# Patient Record
Sex: Female | Born: 1965 | Race: Black or African American | Hispanic: No | Marital: Married | Smoking: Current every day smoker
Health system: Southern US, Community
[De-identification: ages and names within clinical notes are randomized; demographics above are authoritative.]

## PROBLEM LIST (undated history)

## (undated) DIAGNOSIS — I10 Essential (primary) hypertension: Secondary | ICD-10-CM

## (undated) DIAGNOSIS — I639 Cerebral infarction, unspecified: Secondary | ICD-10-CM

## (undated) DIAGNOSIS — D649 Anemia, unspecified: Secondary | ICD-10-CM

## (undated) DIAGNOSIS — E041 Nontoxic single thyroid nodule: Secondary | ICD-10-CM

## (undated) DIAGNOSIS — E119 Type 2 diabetes mellitus without complications: Secondary | ICD-10-CM

## (undated) HISTORY — DX: Type 2 diabetes mellitus without complications: E11.9

## (undated) HISTORY — DX: Cerebral infarction, unspecified: I63.9

---
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
## 2006-05-25 ENCOUNTER — Encounter: Admission: RE | Admit: 2006-05-25 | Discharge: 2006-05-25 | Payer: Self-pay | Admitting: Family Medicine

## 2013-02-19 ENCOUNTER — Other Ambulatory Visit: Payer: Self-pay | Admitting: Nurse Practitioner

## 2013-12-21 ENCOUNTER — Other Ambulatory Visit: Payer: Self-pay | Admitting: Nurse Practitioner

## 2013-12-24 NOTE — Telephone Encounter (Signed)
Last seen 03/14 

## 2013-12-24 NOTE — Telephone Encounter (Signed)
Last refill without being seen 

## 2014-03-24 ENCOUNTER — Telehealth: Payer: Self-pay | Admitting: Nurse Practitioner

## 2014-03-24 NOTE — Telephone Encounter (Signed)
LMOM that she needs an appt before it can be filled

## 2014-04-03 ENCOUNTER — Other Ambulatory Visit: Payer: Self-pay | Admitting: Nurse Practitioner

## 2014-04-06 ENCOUNTER — Other Ambulatory Visit: Payer: Self-pay | Admitting: Nurse Practitioner

## 2014-04-08 ENCOUNTER — Other Ambulatory Visit: Payer: Self-pay | Admitting: Nurse Practitioner

## 2014-04-10 ENCOUNTER — Other Ambulatory Visit: Payer: Self-pay

## 2014-04-17 ENCOUNTER — Telehealth: Payer: Self-pay | Admitting: Nurse Practitioner

## 2014-04-18 MED ORDER — AMLODIPINE BESYLATE 10 MG PO TABS: ORAL_TABLET | ORAL | Status: DC

## 2014-04-18 NOTE — Telephone Encounter (Signed)
Pt aware on VM that med has been sent [facility] - to f/u this week as planned

## 2014-04-23 ENCOUNTER — Ambulatory Visit: Payer: Self-pay | Admitting: Nurse Practitioner

## 2014-08-28 ENCOUNTER — Encounter: Payer: Self-pay | Admitting: Family Medicine

## 2014-08-28 ENCOUNTER — Ambulatory Visit: Payer: Self-pay | Admitting: Family Medicine

## 2014-08-28 VITALS — BP 198/100 | HR 93 | Temp 98.1°F | Ht 62.0 in | Wt 143.6 lb

## 2014-08-28 DIAGNOSIS — I10 Essential (primary) hypertension: Secondary | ICD-10-CM

## 2014-08-28 MED ORDER — AMLODIPINE BESYLATE 10 MG PO TABS: ORAL_TABLET | ORAL | Status: DC

## 2014-08-28 NOTE — Progress Notes (Signed)
   Subjective:    Patient ID: Tammy Bauer, female    DOB: 04-26-1966, 48 y.o.   MRN: [id number]  HPI C/o hypertension and needs refills.   Review of Systems No chest pain, SOB, HA, dizziness, vision change, N/V, diarrhea, constipation, dysuria, urinary urgency or frequency, myalgias, arthralgias or rash.     Objective:   Physical Exam  Vital signs noted  Well developed well nourished female.  HEENT - Head atraumatic Normocephalic                Eyes - PERRLA, Conjuctiva - clear Sclera- Clear EOMI                Ears - EAC's Wnl TM's Wnl Gross Hearing WNL                Nose - Nares patent                 Throat - oropharanx wnl Respiratory - Lungs CTA bilateral Cardiac - RRR S1 and S2 without murmur GI - Abdomen soft Nontender and bowel sounds active x 4 Extremities - No edema. Neuro - Grossly intact.      Assessment & Plan:  Essential hypertension Amlodipine  one po qd #30w/11 rf  Deatra Canter FNP

## 2014-12-20 ENCOUNTER — Encounter (HOSPITAL_COMMUNITY): Payer: Self-pay | Admitting: *Deleted

## 2014-12-20 ENCOUNTER — Emergency Department (HOSPITAL_COMMUNITY)
Admission: EM | Admit: 2014-12-20 | Discharge: 2014-12-20 | Disposition: A | Discharge status: Home / Self Care | Payer: Self-pay | Attending: Emergency Medicine | Admitting: Emergency Medicine

## 2014-12-20 DIAGNOSIS — Z79899 Other long term (current) drug therapy: Secondary | ICD-10-CM | POA: Insufficient documentation

## 2014-12-20 DIAGNOSIS — I10 Essential (primary) hypertension: Secondary | ICD-10-CM | POA: Insufficient documentation

## 2014-12-20 DIAGNOSIS — Z720 Tobacco use: Secondary | ICD-10-CM | POA: Insufficient documentation

## 2014-12-20 DIAGNOSIS — M5442 Lumbago with sciatica, left side: Secondary | ICD-10-CM

## 2014-12-20 DIAGNOSIS — IMO0001 Reserved for inherently not codable concepts without codable children: Secondary | ICD-10-CM

## 2014-12-20 DIAGNOSIS — R030 Elevated blood-pressure reading, without diagnosis of hypertension: Secondary | ICD-10-CM

## 2014-12-20 HISTORY — DX: Essential (primary) hypertension: I10

## 2014-12-20 MED ORDER — TRAMADOL HCL 50 MG PO TABS: 50.0000 mg | ORAL_TABLET | Freq: Four times a day (QID) | ORAL | Status: DC | PRN

## 2014-12-20 MED ORDER — DICLOFENAC POTASSIUM 50 MG PO TABS: 50.0000 mg | ORAL_TABLET | Freq: Three times a day (TID) | ORAL | Status: DC

## 2014-12-20 MED ORDER — IBUPROFEN 400 MG PO TABS
600.0000 mg | ORAL_TABLET | Freq: Once | ORAL | Status: AC
  Administered 2014-12-20: 600 mg via ORAL
  Filled 2014-12-20: qty 2

## 2014-12-20 MED ORDER — CYCLOBENZAPRINE HCL 10 MG PO TABS: 10.0000 mg | ORAL_TABLET | Freq: Two times a day (BID) | ORAL | Status: DC | PRN

## 2014-12-20 MED ORDER — TRAMADOL HCL 50 MG PO TABS
50.0000 mg | ORAL_TABLET | Freq: Once | ORAL | Status: AC
  Administered 2014-12-20: 50 mg via ORAL
  Filled 2014-12-20: qty 1

## 2014-12-20 NOTE — ED Notes (Signed)
Pt states she fell up the steps last Monday, pain got much better but returned a few days ago. States pain has progressively gotten worse since pain returned. Pain is worse when standing straight

## 2014-12-20 NOTE — ED Provider Notes (Signed)
CSN: [id number]     Arrival date & time 12/20/14  0865 History   First MD Initiated Contact with Patient 12/20/14 [phone]     Chief Complaint  Patient presents with  . Back Pain     (Consider location/radiation/quality/duration/timing/severity/associated sxs/prior Treatment) HPI Pt is a 49yo female presenting to ED with c/o left lower back pain that radiates down left leg. Pt reports running and falling up the steps at home 2 weeks ago after her daughter scared her.  Denies hitting head or other injuries. States lower back pain was improving but then came back last week, gradually worsening. Pain is constant, worse with lying flat and standing straight up.  Pain is sharp, shoots down to left lower leg, 10/10. Has taken ibuprofen with minimal relief. No fever, n/v/d. Denies urinary symptoms. No hx of renal stones. Denise hx of cancer, IVDU, or back surgeries.   Past Medical History  Diagnosis Date  . Hypertension    History reviewed. No pertinent past surgical history. No family history on file. History  Substance Use Topics  . Smoking status: Current Every Day Smoker -- 0.50 packs/day    Types: Cigarettes    Start date: 08/29/1999  . Smokeless tobacco: Not on file  . Alcohol Use: No   OB History    No data available     Review of Systems  Constitutional: Negative for fever and chills.  Cardiovascular: Negative for chest pain.  Gastrointestinal: Negative for nausea, vomiting, abdominal pain and diarrhea.  Genitourinary: Negative for dysuria, frequency, hematuria, flank pain and pelvic pain.  Musculoskeletal: Positive for myalgias, back pain and gait problem. Negative for joint swelling, arthralgias and neck stiffness.  All other systems reviewed and are negative.     Allergies  Ace inhibitors  Home Medications   Prior to Admission medications   Medication Sig Start Date End Date Taking? Authorizing Provider  amLODipine (NORVASC) 10 MG tablet TAKE ONE TABLET BY MOUTH ONCE  DAILY 08/28/14   Deatra Canter, FNP  cyclobenzaprine (FLEXERIL) 10 MG tablet Take 1 tablet (10 mg total) by mouth 2 (two) times daily as needed for muscle spasms. 12/20/14   Junius Finner, PA-C  diclofenac (CATAFLAM) 50 MG tablet Take 1 tablet (50 mg total) by mouth 3 (three) times daily. 12/20/14   Junius Finner, PA-C  hydrochlorothiazide (HYDRODIURIL) 25 MG tablet Take 25 mg by mouth daily.    Historical Provider, MD  traMADol (ULTRAM) 50 MG tablet Take 1 tablet (50 mg total) by mouth every 6 (six) hours as needed. 12/20/14   Junius Finner, PA-C   BP 188/115 mmHg  Pulse 89  Temp(Src) 98.1 F (36.7 C) (Oral)  Resp 16  SpO2 98%  LMP 11/29/2014 Physical Exam  Constitutional: She appears well-developed and well-nourished. She appears distressed.  Pt lying on right side in exam bed. Appears mildly uncomfortable.  HENT:  Head: Normocephalic and atraumatic.  Eyes: Conjunctivae are normal. No scleral icterus.  Neck: Normal range of motion. Neck supple.  No midline bone tenderness, no crepitus or step-offs.   Cardiovascular: Normal rate, regular rhythm and normal heart sounds.   Pulmonary/Chest: Effort normal and breath sounds normal. No respiratory distress. She has no wheezes. She has no rales. She exhibits no tenderness.  Abdominal: Soft. She exhibits no distension. There is no tenderness.  Musculoskeletal: She exhibits tenderness. She exhibits no edema.  No midline spinal tenderness. Tenderness to left lower lumbar paraspinal muscles and left buttock. Increased pain with straight leg raise. 4/5 strength in  left hip vs right due to pain. 5/5 plantar flexion and dorsiflexion. 5/5 strength with left knee flexion and extension.  Neurological: She is alert.  Reflex Scores:      Patellar reflexes are 2+ on the right side and 2+ on the left side. Skin: Skin is warm and dry. No rash noted. She is not diaphoretic. No erythema.  Nursing note and vitals reviewed.   ED Course  Procedures (including  critical care time) Labs Review Labs Reviewed - No data to display  Imaging Review No results found.   EKG Interpretation None      MDM   Final diagnoses:  Left-sided low back pain with left-sided sciatica  Elevated BP    Pt presenting to ED with left sided sciatica after falling up the stairs 2 weeks ago. No red flag symptoms. No midline spinal tenderness. Imaging not indicated at this time. Will tx conservatively for pain.  Rx: tramadol, diclofenac, and flexeril. Home care instructions with exercises provided.  BP is elevated at 194/127, pt does have hx of HTN, has not taken her medication this morning.  Advised to f/u with PCP in 3-4 days for recheck of symptoms if not improving. Return precautions provided. Pt verbalized understanding and agreement with tx plan.     Junius Finnerrin O'Malley, PA-C 12/20/14 [id number]  Donnetta HutchingBrian Cook, MD 12/22/14 [phone]

## 2015-05-22 ENCOUNTER — Emergency Department (HOSPITAL_COMMUNITY): Payer: Self-pay

## 2015-05-22 ENCOUNTER — Encounter (HOSPITAL_COMMUNITY): Payer: Self-pay | Admitting: Emergency Medicine

## 2015-05-22 ENCOUNTER — Emergency Department (HOSPITAL_COMMUNITY)
Admission: EM | Admit: 2015-05-22 | Discharge: 2015-05-22 | Disposition: A | Discharge status: Home / Self Care | Payer: Self-pay | Attending: Emergency Medicine | Admitting: Emergency Medicine

## 2015-05-22 DIAGNOSIS — R51 Headache: Secondary | ICD-10-CM | POA: Insufficient documentation

## 2015-05-22 DIAGNOSIS — R42 Dizziness and giddiness: Secondary | ICD-10-CM | POA: Insufficient documentation

## 2015-05-22 DIAGNOSIS — R079 Chest pain, unspecified: Secondary | ICD-10-CM | POA: Insufficient documentation

## 2015-05-22 DIAGNOSIS — Z720 Tobacco use: Secondary | ICD-10-CM | POA: Insufficient documentation

## 2015-05-22 DIAGNOSIS — R112 Nausea with vomiting, unspecified: Secondary | ICD-10-CM | POA: Insufficient documentation

## 2015-05-22 DIAGNOSIS — H5509 Other forms of nystagmus: Secondary | ICD-10-CM | POA: Insufficient documentation

## 2015-05-22 DIAGNOSIS — Z79899 Other long term (current) drug therapy: Secondary | ICD-10-CM | POA: Insufficient documentation

## 2015-05-22 DIAGNOSIS — R739 Hyperglycemia, unspecified: Secondary | ICD-10-CM | POA: Insufficient documentation

## 2015-05-22 DIAGNOSIS — I10 Essential (primary) hypertension: Secondary | ICD-10-CM | POA: Insufficient documentation

## 2015-05-22 LAB — URINE MICROSCOPIC-ADD ON

## 2015-05-22 LAB — CBC WITH DIFFERENTIAL/PLATELET
Basophils Absolute: 0 10*3/uL (ref 0.0–0.1)
Basophils Relative: 0 % (ref 0–1)
EOS ABS: 0 10*3/uL (ref 0.0–0.7)
Eosinophils Relative: 0 % (ref 0–5)
HCT: 42 % (ref 36.0–46.0)
Hemoglobin: 14.2 g/dL (ref 12.0–15.0)
LYMPHS ABS: 1.7 10*3/uL (ref 0.7–4.0)
Lymphocytes Relative: 15 % (ref 12–46)
MCH: 29.3 pg (ref 26.0–34.0)
MCHC: 33.8 g/dL (ref 30.0–36.0)
MCV: 86.6 fL (ref 78.0–100.0)
Monocytes Absolute: 0.6 10*3/uL (ref 0.1–1.0)
Monocytes Relative: 5 % (ref 3–12)
NEUTROS PCT: 80 % — AB (ref 43–77)
Neutro Abs: 8.7 10*3/uL — ABNORMAL HIGH (ref 1.7–7.7)
PLATELETS: 169 10*3/uL (ref 150–400)
RBC: 4.85 MIL/uL (ref 3.87–5.11)
RDW: 15.4 % (ref 11.5–15.5)
WBC: 11 10*3/uL — AB (ref 4.0–10.5)

## 2015-05-22 LAB — COMPREHENSIVE METABOLIC PANEL
ALT: 11 U/L — AB (ref 14–54)
AST: 13 U/L — ABNORMAL LOW (ref 15–41)
Albumin: 4 g/dL (ref 3.5–5.0)
Alkaline Phosphatase: 48 U/L (ref 38–126)
Anion gap: 9 (ref 5–15)
BILIRUBIN TOTAL: 0.5 mg/dL (ref 0.3–1.2)
BUN: 9 mg/dL (ref 6–20)
CALCIUM: 9.9 mg/dL (ref 8.9–10.3)
CHLORIDE: 102 mmol/L (ref 101–111)
CO2: 24 mmol/L (ref 22–32)
CREATININE: 0.59 mg/dL (ref 0.44–1.00)
GFR calc Af Amer: >60 mL/min (ref >60)
GFR calc non Af Amer: >60 mL/min (ref >60)
GLUCOSE: 178 mg/dL — AB (ref 65–99)
Potassium: 3.3 mmol/L — ABNORMAL LOW (ref 3.5–5.1)
Sodium: 135 mmol/L (ref 135–145)
Total Protein: 7.7 g/dL (ref 6.5–8.1)

## 2015-05-22 LAB — URINALYSIS, ROUTINE W REFLEX MICROSCOPIC
Bilirubin Urine: NEGATIVE
Glucose, UA: 250 mg/dL — AB
HGB URINE DIPSTICK: NEGATIVE
Ketones, ur: 40 mg/dL — AB
Leukocytes, UA: NEGATIVE
Nitrite: NEGATIVE
PH: 7 (ref 5.0–8.0)
Protein, ur: 30 mg/dL — AB
SPECIFIC GRAVITY, URINE: 1.022 (ref 1.005–1.030)
UROBILINOGEN UA: 1 mg/dL (ref 0.0–1.0)

## 2015-05-22 LAB — I-STAT TROPONIN, ED: Troponin i, poc: 0 ng/mL (ref 0.00–0.08)

## 2015-05-22 LAB — POC URINE PREG, ED: PREG TEST UR: NEGATIVE

## 2015-05-22 LAB — LIPASE, BLOOD: Lipase: 25 U/L (ref 22–51)

## 2015-05-22 MED ORDER — MECLIZINE HCL 25 MG PO TABS
25.0000 mg | ORAL_TABLET | Freq: Once | ORAL | Status: AC
  Administered 2015-05-22: 25 mg via ORAL
  Filled 2015-05-22: qty 1

## 2015-05-22 MED ORDER — METOCLOPRAMIDE HCL 5 MG/ML IJ SOLN
10.0000 mg | Freq: Once | INTRAMUSCULAR | Status: AC
  Administered 2015-05-22: 10 mg via INTRAVENOUS
  Filled 2015-05-22: qty 2

## 2015-05-22 MED ORDER — DIPHENHYDRAMINE HCL 50 MG/ML IJ SOLN
25.0000 mg | Freq: Once | INTRAMUSCULAR | Status: AC
  Administered 2015-05-22: 25 mg via INTRAVENOUS
  Filled 2015-05-22: qty 1

## 2015-05-22 MED ORDER — SODIUM CHLORIDE 0.9 % IV BOLUS (SEPSIS)
1000.0000 mL | Freq: Once | INTRAVENOUS | Status: AC
  Administered 2015-05-22: 1000 mL via INTRAVENOUS

## 2015-05-22 MED ORDER — MECLIZINE HCL 25 MG PO TABS: 12.5000 mg | ORAL_TABLET | Freq: Three times a day (TID) | ORAL | Status: DC | PRN

## 2015-05-22 NOTE — ED Notes (Signed)
Pt c/o dizziness, nv, generalized weakness and headache since last night.

## 2015-05-22 NOTE — ED Provider Notes (Addendum)
CSN: [id number]     Arrival date & time 05/22/15  1912 History   First MD Initiated Contact with Patient 05/22/15 1947     Chief Complaint  Patient presents with  . Dizziness  . Emesis     (Consider location/radiation/quality/duration/timing/severity/associated sxs/prior Treatment) The history is provided by the patient.  Adline Braun is a 49 y.o. female who presenting with headache, dizziness, nausea, vomiting. Patient had some vaginal bleeding for the last 3 weeks. About 3 days ago. Intermittent nosebleeds as well. Yesterday night, she had acute onset of epigastric pain that resolved. She then got up and suddenly felt dizzy and have vertigo. Larey Seat that the room was spinning and felt unsteady. Also feels lightheaded and dizzy like she is going to pass out. She also has some headaches as well. Vomited three times.    Past Medical History  Diagnosis Date  . Hypertension    History reviewed. No pertinent past surgical history. No family history on file. History  Substance Use Topics  . Smoking status: Current Every Day Smoker -- 0.50 packs/day    Types: Cigarettes    Start date: 08/29/1999  . Smokeless tobacco: Not on file  . Alcohol Use: No   OB History    No data available     Review of Systems  Gastrointestinal: Positive for vomiting.  Neurological: Positive for dizziness.  All other systems reviewed and are negative.     Allergies  Ace inhibitors  Home Medications   Prior to Admission medications   Medication Sig Start Date End Date Taking? Authorizing Provider  acetaminophen (TYLENOL) 325 MG tablet Take 650 mg by mouth every 6 (six) hours as needed.   Yes Historical Provider, MD  amLODipine (NORVASC) 10 MG tablet TAKE ONE TABLET BY MOUTH ONCE DAILY 08/28/14  Yes Deatra Canter, FNP  ibuprofen (ADVIL,MOTRIN) 400 MG tablet Take 400 mg by mouth every 6 (six) hours as needed for moderate pain.   Yes Historical Provider, MD  naproxen sodium (ANAPROX) 220 MG tablet Take  220 mg by mouth 2 (two) times daily as needed (for pain).   Yes Historical Provider, MD  cyclobenzaprine (FLEXERIL) 10 MG tablet Take 1 tablet (10 mg total) by mouth 2 (two) times daily as needed for muscle spasms. 12/20/14   Junius Finner, PA-C  diclofenac (CATAFLAM) 50 MG tablet Take 1 tablet (50 mg total) by mouth 3 (three) times daily. 12/20/14   Junius Finner, PA-C  traMADol (ULTRAM) 50 MG tablet Take 1 tablet (50 mg total) by mouth every 6 (six) hours as needed. 12/20/14   Junius Finner, PA-C   BP 163/87 mmHg  Pulse 96  Temp(Src) 97.7 F (36.5 C) (Oral)  Resp 19  SpO2 100%  LMP 05/02/2015 Physical Exam  Constitutional: She is oriented to person, place, and time.  Uncomfortable   HENT:  Head: Normocephalic.  Eyes: Conjunctivae and EOM are normal. Pupils are equal, round, and reactive to light.  R horizontal nystagmus, not changing with direction, fatigable   Neck: Normal range of motion. Neck supple.  Cardiovascular: Normal rate, regular rhythm and normal heart sounds.   Pulmonary/Chest: Effort normal and breath sounds normal. No respiratory distress. She has no wheezes. She has no rales.  Abdominal: Soft. Bowel sounds are normal. She exhibits no distension. There is no tenderness. There is no rebound.  Musculoskeletal: Normal range of motion. She exhibits no edema or tenderness.  Neurological: She is alert and oriented to person, place, and time.  CN 2-12 intact. Nl  finger to nose. Nl gait. Nl strength throughout   Skin: Skin is warm and dry.  Psychiatric: She has a normal mood and affect. Her behavior is normal. Judgment and thought content normal.  Nursing note and vitals reviewed.   ED Course  Procedures (including critical care time) Labs Review Labs Reviewed  CBC WITH DIFFERENTIAL/PLATELET - Abnormal; Notable for the following:    WBC 11.0 (*)    Neutrophils Relative % 80 (*)    Neutro Abs 8.7 (*)    All other components within normal limits  COMPREHENSIVE METABOLIC  PANEL - Abnormal; Notable for the following:    Potassium 3.3 (*)    Glucose, Bld 178 (*)    AST 13 (*)    ALT 11 (*)    All other components within normal limits  URINALYSIS, ROUTINE W REFLEX MICROSCOPIC (NOT AT [facility]) - Abnormal; Notable for the following:    Glucose, UA 250 (*)    Ketones, ur 40 (*)    Protein, ur 30 (*)    All other components within normal limits  LIPASE, BLOOD  URINE MICROSCOPIC-ADD ON  I-STAT TROPOININ, ED  POC URINE PREG, ED    Imaging Review Ct Head Wo Contrast  05/22/2015   CLINICAL DATA:  Generalized weakness, dizziness, nausea, vomiting and headache since last night.  EXAM: CT HEAD WITHOUT CONTRAST  TECHNIQUE: Contiguous axial images were obtained from the base of the skull through the vertex without intravenous contrast.  COMPARISON:  None.  FINDINGS: Normal appearing cerebral hemispheres and posterior fossa structures. Normal size and position of the ventricles. No intracranial hemorrhage, mass lesion or CT evidence of acute infarction. Unremarkable bones and included paranasal sinuses.  IMPRESSION: Normal examination.   Electronically Signed   By: Beckie Salts M.D.   On: 05/22/2015 20:22   Dg Chest Port 1 View  05/22/2015   CLINICAL DATA:  Generalized weakness and dizziness for 1 day  EXAM: PORTABLE CHEST - 1 VIEW  COMPARISON:  None.  FINDINGS: Lungs are clear. Heart size and pulmonary vascularity are normal. No adenopathy. No bone lesions.  IMPRESSION: No edema or consolidation.   Electronically Signed   By: Bretta Bang III M.D.   On: 05/22/2015 20:16     EKG Interpretation   Date/Time:  Saturday May 22 2015 19:53:24 EDT Ventricular Rate:  80 PR Interval:  110 QRS Duration: 97 QT Interval:  370 QTC Calculation: 427 R Axis:   100 Text Interpretation:  Sinus rhythm Borderline short PR interval Consider  right ventricular hypertrophy Consider left ventricular hypertrophy  Nonspecific T abnormalities, lateral leads Anterior ST elevation,  probably  due to LVH No significant change since last tracing Confirmed by YAO  MD,  DAVID ([id number]) on 05/22/2015 8:10:25 PM      MDM   Final diagnoses:  Chest pain    Keliah Tsou is a 49 y.o. female here with vertigo, nausea, vomiting. Likely vertigo. Will get ct head, give meclizine and migraine cocktail and reassess.  8 pm EKG showed possible ST elevation V2, V3. No previous to compare. No chest pain. Discussed with Dr. Eldridge Dace, who thinks likely LVH with early repol. No reciprocal changes. Trop neg. Will not activate STEMI.   10:28 PM CT head unremarkable. Able to ambulate with no dizziness. I doubt posterior circulation stroke. Glucose 178. No hx of diabetes. Can be new onset diabetes. Will have her f/u PMD. Will not start metformin for now.    Richardean Canal, MD 05/22/15 2229  Onalee Hua  Jackie Plum, MD 05/22/15 2231

## 2015-05-22 NOTE — ED Notes (Signed)
Patient transported to CT 

## 2015-05-22 NOTE — Discharge Instructions (Signed)
Take meclizine for dizziness.   Your blood sugar is elevated. You may have prediabetes or diabetes. You should see your doctor about it.   Return to ER if you have worse dizziness, headache, vomiting.

## 2015-05-22 NOTE — ED Notes (Signed)
Pt sts she just got off her menstrual cycle which lasted 3 weeks. C/o recent nose bleeds as well. Is not on blood thinners.

## 2015-09-30 ENCOUNTER — Other Ambulatory Visit: Payer: Self-pay | Admitting: Family Medicine

## 2015-10-04 ENCOUNTER — Other Ambulatory Visit: Payer: Self-pay | Admitting: Family Medicine

## 2015-10-07 ENCOUNTER — Ambulatory Visit (INDEPENDENT_AMBULATORY_CARE_PROVIDER_SITE_OTHER): Payer: Self-pay | Admitting: Pediatrics

## 2015-10-07 ENCOUNTER — Encounter: Payer: Self-pay | Admitting: Pediatrics

## 2015-10-07 VITALS — BP 180/115 | HR 92 | Temp 98.1°F | Ht 62.0 in | Wt 146.4 lb

## 2015-10-07 DIAGNOSIS — I10 Essential (primary) hypertension: Secondary | ICD-10-CM

## 2015-10-07 DIAGNOSIS — E1159 Type 2 diabetes mellitus with other circulatory complications: Secondary | ICD-10-CM | POA: Insufficient documentation

## 2015-10-07 DIAGNOSIS — R739 Hyperglycemia, unspecified: Secondary | ICD-10-CM

## 2015-10-07 LAB — POCT GLYCOSYLATED HEMOGLOBIN (HGB A1C): Hemoglobin A1C: 7.7

## 2015-10-07 MED ORDER — AMLODIPINE BESYLATE 10 MG PO TABS: ORAL_TABLET | ORAL | Status: DC

## 2015-10-07 NOTE — Progress Notes (Signed)
    Subjective:    Patient ID: Tammy Bauer, female    DOB: 02-Sep-1966, 49 y.o.   MRN: [id number]  CC: f/u medical problems, DMV forms  HPI: Tammy Bauer is a 49 y.o. female presenting on 10/07/2015 for DMV forms  BP at home 149s-150s while on amlodipine Has not had medicine at home for past week Has a headache when her BP is up No nausea/vomiting, no chest pain, no SOB Had some heart burn last night Seen in ED 4 months ago for vertigo. Went away within the week, never returned. Otherwise feeling well. 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 Interim medical history since our last visit reviewed. Allergies and medications reviewed and updated.   ROS: Per HPI unless specifically indicated above  Past Medical History Patient Active Problem List   Diagnosis Date Noted  . Essential hypertension 10/07/2015  . Hyperglycemia 10/07/2015    Current Outpatient Prescriptions  Medication Sig Dispense Refill  . acetaminophen (TYLENOL) 325 MG tablet Take 650 mg by mouth every 6 (six) hours as needed.    Marland Kitchen. amLODipine (NORVASC) 10 MG tablet TAKE ONE TABLET BY MOUTH ONCE DAILY 30 tablet 11   No current facility-administered medications for this visit.       Objective:    BP 180/115 mmHg  Pulse 92  Temp(Src) 98.1 F (36.7 C) (Oral)  Ht 5\' 2"  (1.575 m)  Wt 146 lb 6.4 oz (66.407 kg)  BMI 26.77 kg/m2  Wt Readings from Last 3 Encounters:  10/07/15 146 lb 6.4 oz (66.407 kg)  08/28/14 143 lb 9.6 oz (65.137 kg)    Gen: NAD, alert, cooperative with exam, NCAT EYES: EOMI, no scleral injection or icterus ENT:  TMs pearly gray b/l, OP without erythema LYMPH: no cervical LAD CV: NRRR, normal S1/S2, no murmur, distal pulses 2+ b/l Resp: CTABL, no wheezes, normal WOB Abd: +BS, soft, NTND. 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 Needs to restart BP meds. Slight headache today. No vision changes. Will check below labs, also had hyperglycemia on most recent BMP, will send HgA1c. RTC next week for recheck BP. Likely needs to be on additional agent if BPs at home were really regularly 150s on amlodipine. [facility] because [facility] uncontrolled [facility]. RTC next week.  Diagnoses and all orders for this visit:  Essential hypertension -     amLODipine (NORVASC) 10 MG tablet; TAKE ONE TABLET BY MOUTH ONCE DAILY -     POCT glycosylated hemoglobin (Hb A1C) -     Sodium -     Potassium -     Creatinine, serum  Hyperglycemia -     POCT glycosylated hemoglobin (Hb A1C)  Follow up plan: Next week for recheck BP  Rex Krasarol Dajuan Turnley, MD [facility] 10/07/2015, 1:58 PM

## 2015-10-08 LAB — CREATININE, SERUM
CREATININE: 0.71 mg/dL (ref 0.57–1.00)
GFR calc non Af Amer: 102 mL/min/{1.73_m2} (ref >59)
GFR, EST AFRICAN AMERICAN: 117 mL/min/{1.73_m2} (ref >59)

## 2015-10-08 LAB — POTASSIUM: POTASSIUM: 4.1 mmol/L (ref 3.5–5.2)

## 2015-10-08 LAB — SODIUM: SODIUM: 137 mmol/L (ref 136–144)

## 2015-10-14 ENCOUNTER — Ambulatory Visit: Payer: Self-pay | Admitting: Pediatrics

## 2015-10-15 ENCOUNTER — Ambulatory Visit (INDEPENDENT_AMBULATORY_CARE_PROVIDER_SITE_OTHER): Payer: Self-pay | Admitting: Pediatrics

## 2015-10-15 ENCOUNTER — Encounter: Payer: Self-pay | Admitting: Pediatrics

## 2015-10-15 VITALS — BP 150/95 | HR 90 | Temp 97.5°F | Ht 62.0 in | Wt 148.0 lb

## 2015-10-15 DIAGNOSIS — E119 Type 2 diabetes mellitus without complications: Secondary | ICD-10-CM

## 2015-10-15 DIAGNOSIS — R030 Elevated blood-pressure reading, without diagnosis of hypertension: Secondary | ICD-10-CM

## 2015-10-15 DIAGNOSIS — IMO0001 Reserved for inherently not codable concepts without codable children: Secondary | ICD-10-CM

## 2015-10-15 MED ORDER — METFORMIN HCL 500 MG PO TABS: 500.0000 mg | ORAL_TABLET | Freq: Two times a day (BID) | ORAL | Status: DC

## 2015-10-15 MED ORDER — LOSARTAN POTASSIUM 50 MG PO TABS: 50.0000 mg | ORAL_TABLET | Freq: Every day | ORAL | Status: DC

## 2015-10-15 NOTE — Progress Notes (Signed)
    Subjective:    Patient ID: Tammy Bauer, female    DOB: Feb 05, 1966, 49 y.o.   MRN: [id number]  CC: BP recheck  HPI: Tammy Bauer is a 49 y.o. female presenting on 10/15/2015 for Blood Pressure Check  BPs have been 150s/100s at home No headache or vision changes   Relevant past medical, surgical, family and social history reviewed and updated as indicated. Interim medical history since our last visit reviewed. Allergies and medications reviewed and updated.   ROS: Per HPI unless specifically indicated above  Past Medical History Patient Active Problem List   Diagnosis Date Noted  . Essential hypertension 10/07/2015  . Hyperglycemia 10/07/2015    Current Outpatient Prescriptions  Medication Sig Dispense Refill  . acetaminophen (TYLENOL) 325 MG tablet Take 650 mg by mouth every 6 (six) hours as needed.    Marland Kitchen. amLODipine (NORVASC) 10 MG tablet TAKE ONE TABLET BY MOUTH ONCE DAILY 30 tablet 11  . losartan (COZAAR) 50 MG tablet Take 1 tablet (50 mg total) by mouth daily. 90 tablet 3  . metFORMIN (GLUCOPHAGE) 500 MG tablet Take 1 tablet (500 mg total) by mouth 2 (two) times daily with a meal. 180 tablet 3   No current facility-administered medications for this visit.       Objective:    BP 150/95 mmHg  Pulse 90  Temp(Src) 97.5 F (36.4 C) (Oral)  Ht 5\' 2"  (1.575 m)  Wt 148 lb (67.132 kg)  BMI 27.06 kg/m2  Wt Readings from Last 3 Encounters:  10/15/15 148 lb (67.132 kg)  10/07/15 146 lb 6.4 oz (66.407 kg)  08/28/14 143 lb 9.6 oz (65.137 kg)    Gen: NAD, alert EYES: EOMI, no scleral injection or icterus CV: NRRR, normal S1/S2, no murmur, distal pulses 2+ b/l Resp: CTABL, no wheezes, normal WOB Neuro: Alert and oriented, normal sensation to feet with touch and monofilament Skin: no breakdown of skin on feet     Assessment & Plan:   [name] was seen today for blood pressure check. Still elevated, start losartan. Return next week for BP check. If normal then, will need BP  check in 6 months. Discussed labs from last visit--pt does have diabetes. Will start metformin BID, stop sodas, avoid sugary carb heavy foods. Next HgA1c at next visit, ideally 3 months.  Diagnoses and all orders for this visit:  Elevated blood pressure -     losartan (COZAAR) 50 MG tablet; Take 1 tablet (50 mg total) by mouth daily.  Type 2 diabetes mellitus without complication, without long-term current use of insulin (HCC) -     metFORMIN (GLUCOPHAGE) 500 MG tablet; Take 1 tablet (500 mg total) by mouth 2 (two) times daily with a meal.   Follow up plan: Return for nurse visit Monday for BP check, see Dr. Oswaldo Donevincent in 3 months.  Rex Krasarol Yosmar Ryker, MD [facility] 10/15/2015, 2:05 PM

## 2016-01-17 ENCOUNTER — Ambulatory Visit: Payer: Self-pay | Admitting: Pediatrics

## 2016-01-18 ENCOUNTER — Encounter: Payer: Self-pay | Admitting: Pediatrics

## 2016-04-02 ENCOUNTER — Emergency Department (HOSPITAL_COMMUNITY)
Admission: EM | Admit: 2016-04-02 | Discharge: 2016-04-02 | Disposition: A | Discharge status: Home / Self Care | Payer: Self-pay | Attending: Emergency Medicine | Admitting: Emergency Medicine

## 2016-04-02 ENCOUNTER — Encounter (HOSPITAL_COMMUNITY): Payer: Self-pay | Admitting: *Deleted

## 2016-04-02 DIAGNOSIS — F17210 Nicotine dependence, cigarettes, uncomplicated: Secondary | ICD-10-CM | POA: Insufficient documentation

## 2016-04-02 DIAGNOSIS — I10 Essential (primary) hypertension: Secondary | ICD-10-CM | POA: Insufficient documentation

## 2016-04-02 DIAGNOSIS — Z79899 Other long term (current) drug therapy: Secondary | ICD-10-CM | POA: Insufficient documentation

## 2016-04-02 DIAGNOSIS — R1013 Epigastric pain: Secondary | ICD-10-CM | POA: Insufficient documentation

## 2016-04-02 LAB — URINALYSIS, ROUTINE W REFLEX MICROSCOPIC
Bilirubin Urine: NEGATIVE
GLUCOSE, UA: NEGATIVE mg/dL
Hgb urine dipstick: NEGATIVE
KETONES UR: 15 mg/dL — AB
NITRITE: NEGATIVE
PROTEIN: NEGATIVE mg/dL
Specific Gravity, Urine: 1.015 (ref 1.005–1.030)
pH: 6 (ref 5.0–8.0)

## 2016-04-02 LAB — COMPREHENSIVE METABOLIC PANEL
ALT: 11 U/L — ABNORMAL LOW (ref 14–54)
AST: 13 U/L — ABNORMAL LOW (ref 15–41)
Albumin: 4.1 g/dL (ref 3.5–5.0)
Alkaline Phosphatase: 46 U/L (ref 38–126)
Anion gap: 7 (ref 5–15)
BILIRUBIN TOTAL: 0.7 mg/dL (ref 0.3–1.2)
BUN: 10 mg/dL (ref 6–20)
CHLORIDE: 106 mmol/L (ref 101–111)
CO2: 24 mmol/L (ref 22–32)
CREATININE: 0.71 mg/dL (ref 0.44–1.00)
Calcium: 9.7 mg/dL (ref 8.9–10.3)
Glucose, Bld: 176 mg/dL — ABNORMAL HIGH (ref 65–99)
POTASSIUM: 3.6 mmol/L (ref 3.5–5.1)
Sodium: 137 mmol/L (ref 135–145)
TOTAL PROTEIN: 8 g/dL (ref 6.5–8.1)

## 2016-04-02 LAB — CBC
HEMATOCRIT: 47.5 % — AB (ref 36.0–46.0)
Hemoglobin: 16.1 g/dL — ABNORMAL HIGH (ref 12.0–15.0)
MCH: 30.5 pg (ref 26.0–34.0)
MCHC: 33.9 g/dL (ref 30.0–36.0)
MCV: 90 fL (ref 78.0–100.0)
PLATELETS: 333 10*3/uL (ref 150–400)
RBC: 5.28 MIL/uL — AB (ref 3.87–5.11)
RDW: 15.2 % (ref 11.5–15.5)
WBC: 7 10*3/uL (ref 4.0–10.5)

## 2016-04-02 LAB — URINE MICROSCOPIC-ADD ON

## 2016-04-02 LAB — TROPONIN I: Troponin I: <0.03 ng/mL (ref <0.031)

## 2016-04-02 LAB — LIPASE, BLOOD: LIPASE: 29 U/L (ref 11–51)

## 2016-04-02 MED ORDER — ONDANSETRON HCL 4 MG/2ML IJ SOLN
4.0000 mg | Freq: Once | INTRAMUSCULAR | Status: AC
  Administered 2016-04-02: 4 mg via INTRAVENOUS
  Filled 2016-04-02: qty 2

## 2016-04-02 MED ORDER — ESOMEPRAZOLE MAGNESIUM 40 MG PO CPDR: 40.0000 mg | DELAYED_RELEASE_CAPSULE | Freq: Every day | ORAL | Status: DC

## 2016-04-02 MED ORDER — GI COCKTAIL ~~LOC~~: 30.0000 mL | Freq: Once | ORAL | Status: DC

## 2016-04-02 MED ORDER — GI COCKTAIL ~~LOC~~
30.0000 mL | Freq: Once | ORAL | Status: AC
  Administered 2016-04-02: 30 mL via ORAL
  Filled 2016-04-02: qty 30

## 2016-04-02 MED ORDER — FAMOTIDINE 20 MG PO TABS: 20.0000 mg | ORAL_TABLET | Freq: Two times a day (BID) | ORAL | Status: DC

## 2016-04-02 NOTE — ED Notes (Signed)
[  t c/o epigastric pain with n/c that has become worse over the past week, worse after eating as well and laying down,

## 2016-04-02 NOTE — ED Provider Notes (Signed)
CSN: [id number]     Arrival date & time 04/02/16  4098 History  By signing my name below, I, Tammy Bauer, attest that this documentation has been prepared under the direction and in the presence of Eber Hong, MD. Electronically Signed: Linus Bauer, ED Scribe. 04/02/2016. 8:11 AM.    Chief Complaint  Patient presents with  . Abdominal Pain   The history is provided by the patient. No language interpreter was used.   HPI Comments: Tammy Bauer is a 50 y.o. female who presents to the Emergency Department with a PMHx HTN complaining of intermittent nonexertional epigastric abdominal pain that began 1 week ago. Pt describes her pain as if "something is churning" in her abdomen. She states the pain radiates to her back. Her pain is worse at night while lying down and when eating. Pt takes ibuprofen daily. Pt denies any fevers, chills, CP, SOB, N/V/D, leg pain/swelling or any other symptoms at this time. Pt occasionally drinks alcohol.   Past Medical History  Diagnosis Date  . Hypertension    History reviewed. No pertinent past surgical history. No family history on file. Social History  Substance Use Topics  . Smoking status: Current Every Day Smoker -- 0.50 packs/day    Types: Cigarettes    Start date: 08/29/1999  . Smokeless tobacco: None  . Alcohol Use: No   OB History    No data available     Review of Systems  Constitutional: Negative for fever and chills.  Respiratory: Negative for shortness of breath.   Cardiovascular: Negative for chest pain and leg swelling.  Gastrointestinal: Positive for abdominal pain. Negative for nausea, vomiting and diarrhea.  All other systems reviewed and are negative.  Allergies  Ace inhibitors  Home Medications   Prior to Admission medications   Medication Sig Start Date End Date Taking? Authorizing Provider  acetaminophen (TYLENOL) 325 MG tablet Take 650 mg by mouth every 6 (six) hours as needed.   Yes Historical Provider, MD   amLODipine (NORVASC) 10 MG tablet TAKE ONE TABLET BY MOUTH ONCE DAILY 10/07/15  Yes Johna Sheriff, MD  losartan (COZAAR) 50 MG tablet Take 1 tablet (50 mg total) by mouth daily. Patient taking differently: Take 50 mg by mouth daily.  10/15/15  Yes Johna Sheriff, MD  esomeprazole (NEXIUM) 40 MG capsule Take 1 capsule (40 mg total) by mouth daily. 04/02/16   Eber Hong, MD  famotidine (PEPCID) 20 MG tablet Take 1 tablet (20 mg total) by mouth 2 (two) times daily. 04/02/16   Eber Hong, MD  metFORMIN (GLUCOPHAGE) 500 MG tablet Take 1 tablet (500 mg total) by mouth 2 (two) times daily with a meal. 10/15/15   Johna Sheriff, MD   BP 202/121 mmHg  Pulse 98  Temp(Src) 97.9 F (36.6 C) (Oral)  Resp 20  Ht  (1.575 m)  Wt 145 lb (65.772 kg)  BMI 26.51 kg/m2  SpO2 98%  LMP 03/19/2016 Physical Exam  Constitutional: She appears well-developed and well-nourished. No distress.  HENT:  Head: Normocephalic and atraumatic.  Mouth/Throat: Oropharynx is clear and moist. No oropharyngeal exudate.  Eyes: Conjunctivae and EOM are normal. Pupils are equal, round, and reactive to light. Right eye exhibits no discharge. Left eye exhibits no discharge. No scleral icterus.  Neck: Normal range of motion. Neck supple. No JVD present. No thyromegaly present.  Cardiovascular: Normal rate, regular rhythm, normal heart sounds and intact distal pulses.  Exam reveals no gallop and no friction rub.  No murmur heard. Pulmonary/Chest: Effort normal and breath sounds normal. No respiratory distress. She has no wheezes. She has no rales.  Abdominal: Soft. Bowel sounds are normal. She exhibits no distension and no mass. There is tenderness. There is no guarding.  epigastric abdominal tenderness; negative McBurney's point tenderness; negative Murphy's Sign  Musculoskeletal: Normal range of motion. She exhibits no edema or tenderness.  Lymphadenopathy:    She has no cervical adenopathy.  Neurological: She is  alert. Coordination normal.  Skin: Skin is warm and dry. No rash noted. No erythema.  Psychiatric: She has a normal mood and affect. Her behavior is normal.  Nursing note and vitals reviewed.   ED Course  Procedures  DIAGNOSTIC STUDIES: Oxygen Saturation is 98% on room air, normal by my interpretation.    COORDINATION OF CARE: 8:05 AM Will give GI cocktail and Zofran. Will order EKG, blood work and urinalysis. Discussed treatment plan with pt at bedside and pt agreed to plan.  Labs Review Labs Reviewed  COMPREHENSIVE METABOLIC PANEL - Abnormal; Notable for the following:    Glucose, Bld 176 (*)    AST 13 (*)    ALT 11 (*)    All other components within normal limits  CBC - Abnormal; Notable for the following:    RBC 5.28 (*)    Hemoglobin 16.1 (*)    HCT 47.5 (*)    All other components within normal limits  URINALYSIS, ROUTINE W REFLEX MICROSCOPIC (NOT AT [facility]) - Abnormal; Notable for the following:    Ketones, ur 15 (*)    Leukocytes, UA SMALL (*)    All other components within normal limits  URINE MICROSCOPIC-ADD ON - Abnormal; Notable for the following:    Squamous Epithelial / LPF 0-5 (*)    Bacteria, UA MANY (*)    All other components within normal limits  LIPASE, BLOOD  TROPONIN I    Imaging Review No results found. I have personally reviewed and evaluated these images and lab results as part of my medical decision-making.   EKG Interpretation   Date/Time:  Sunday April 02 2016 08:25:24 EDT Ventricular Rate:  68 PR Interval:  163 QRS Duration: 95 QT Interval:  425 QTC Calculation: 452 R Axis:   59 Text Interpretation:  Sinus rhythm Probable left ventricular hypertrophy  Anterior ST elevation, probably due to LVH since last tracing no  significant change Confirmed by Yanelis Osika  MD, Herschell Virani ([id number]) on 04/02/2016  8:29:25 AM      MDM   Final diagnoses:  Epigastric pain    The pt has no signs of acute abdomen Labs viewed and interpreted and neg for  acute pancreatitis / cholecystitis, CBC normal She does have elevated BP - this will be rechecked prior to d/c.  She has not had her BP meds this AM.  No indication for imaging  Pt given results and tx plan including acid treatemnt - she is in agreement.  I personally performed the services described in this documentation, which was scribed in my presence. The recorded information has been reviewed and is accurate.   169 / 107 on recheck - she will take her meds when she gets home.  Meds given in ED:  Medications  gi cocktail (Maalox,Lidocaine,Donnatal) (not administered)  ondansetron (ZOFRAN) injection 4 mg (4 mg Intravenous Given 04/02/16 0827)  gi cocktail (Maalox,Lidocaine,Donnatal) (30 mLs Oral Given 04/02/16 0827)    New Prescriptions   ESOMEPRAZOLE (NEXIUM) 40 MG CAPSULE    Take 1 capsule (40 mg  total) by mouth daily.   FAMOTIDINE (PEPCID) 20 MG TABLET    Take 1 tablet (20 mg total) by mouth 2 (two) times daily.        Eber HongBrian Unice Vantassel, MD 04/02/16 [phone]

## 2016-04-02 NOTE — Discharge Instructions (Signed)

## 2016-04-02 NOTE — ED Notes (Signed)
   04/02/16 0631  Abdominal  Gastrointestinal (WDL) X  Abdomen Inspection Soft  Tenderness Nontender  Pt reports upper abdominal pain for the past week. States pain is worse after eating or laying down.

## 2016-10-14 ENCOUNTER — Other Ambulatory Visit: Payer: Self-pay | Admitting: Pediatrics

## 2016-10-14 DIAGNOSIS — I10 Essential (primary) hypertension: Secondary | ICD-10-CM

## 2017-06-14 ENCOUNTER — Encounter (HOSPITAL_COMMUNITY): Payer: Self-pay | Admitting: Emergency Medicine

## 2017-06-14 ENCOUNTER — Emergency Department (HOSPITAL_COMMUNITY)
Admission: EM | Admit: 2017-06-14 | Discharge: 2017-06-14 | Disposition: A | Discharge status: Home / Self Care | Payer: Self-pay | Attending: Emergency Medicine | Admitting: Emergency Medicine

## 2017-06-14 DIAGNOSIS — Z79899 Other long term (current) drug therapy: Secondary | ICD-10-CM | POA: Insufficient documentation

## 2017-06-14 DIAGNOSIS — F17210 Nicotine dependence, cigarettes, uncomplicated: Secondary | ICD-10-CM | POA: Insufficient documentation

## 2017-06-14 DIAGNOSIS — Z7984 Long term (current) use of oral hypoglycemic drugs: Secondary | ICD-10-CM | POA: Insufficient documentation

## 2017-06-14 DIAGNOSIS — E119 Type 2 diabetes mellitus without complications: Secondary | ICD-10-CM

## 2017-06-14 DIAGNOSIS — I10 Essential (primary) hypertension: Secondary | ICD-10-CM | POA: Insufficient documentation

## 2017-06-14 DIAGNOSIS — H9202 Otalgia, left ear: Secondary | ICD-10-CM | POA: Insufficient documentation

## 2017-06-14 MED ORDER — AMLODIPINE BESYLATE 5 MG PO TABS
10.0000 mg | ORAL_TABLET | Freq: Once | ORAL | Status: AC
  Administered 2017-06-14: 10 mg via ORAL
  Filled 2017-06-14: qty 2

## 2017-06-14 MED ORDER — LOSARTAN POTASSIUM 50 MG PO TABS: 50.0000 mg | ORAL_TABLET | Freq: Every day | ORAL | 3 refills | Status: DC

## 2017-06-14 MED ORDER — MECLIZINE HCL 25 MG PO TABS: 25.0000 mg | ORAL_TABLET | Freq: Three times a day (TID) | ORAL | 0 refills | Status: DC | PRN

## 2017-06-14 MED ORDER — LOSARTAN POTASSIUM 50 MG PO TABS
50.0000 mg | ORAL_TABLET | ORAL | Status: AC
  Administered 2017-06-14: 50 mg via ORAL
  Filled 2017-06-14 (×2): qty 1

## 2017-06-14 MED ORDER — TRAMADOL HCL 50 MG PO TABS: 50.0000 mg | ORAL_TABLET | Freq: Four times a day (QID) | ORAL | 0 refills | Status: DC | PRN

## 2017-06-14 MED ORDER — METOCLOPRAMIDE HCL 10 MG PO TABS: 10.0000 mg | ORAL_TABLET | Freq: Four times a day (QID) | ORAL | 0 refills | Status: DC | PRN

## 2017-06-14 MED ORDER — AMLODIPINE BESYLATE 10 MG PO TABS: ORAL_TABLET | ORAL | 11 refills | Status: DC

## 2017-06-14 NOTE — ED Triage Notes (Signed)
Patient has left ear pain x 2 days, also has elevated B/P, has not had blood pressure medicine in 2 months.

## 2017-06-14 NOTE — ED Provider Notes (Signed)
[facility] Provider Note   CSN: [id number] Arrival date & time: 06/14/17  0605     History   Chief Complaint Chief Complaint  Patient presents with  . Otalgia    HPI Tammy Bauer is a 51 y.o. female.  The history is provided by the patient.  Otalgia   She complains of onset last night of left ear pain. She rates it at 8/10. There is no hearing loss and no vertigo. She denies any trauma and denies recent swimming. She is concerned because she has a history of vertigo. There is no fever, chills, sweats.  Also, patient states that she ran out of her blood pressure medication 2 months ago. Her insurance had lapsed, so she had not been back to her PCP to get a new prescription.  Past Medical History:  Diagnosis Date  . Hypertension     Patient Active Problem List   Diagnosis Date Noted  . Essential hypertension 10/07/2015  . Hyperglycemia 10/07/2015    History reviewed. No pertinent surgical history.  OB History    No data available       Home Medications    Prior to Admission medications   Medication Sig Start Date End Date Taking? Authorizing Provider  acetaminophen (TYLENOL) 325 MG tablet Take 650 mg by mouth every 6 (six) hours as needed.    [provider]  amLODipine (NORVASC) 10 MG tablet TAKE ONE TABLET BY MOUTH ONCE DAILY 06/14/17   Dione Booze, MD  esomeprazole (NEXIUM) 40 MG capsule Take 1 capsule (40 mg total) by mouth daily. 04/02/16   Eber Hong, MD  famotidine (PEPCID) 20 MG tablet Take 1 tablet (20 mg total) by mouth 2 (two) times daily. 04/02/16   Eber Hong, MD  losartan (COZAAR) 50 MG tablet Take 1 tablet (50 mg total) by mouth daily. 06/14/17   Dione Booze, MD  meclizine (ANTIVERT) 25 MG tablet Take 1 tablet (25 mg total) by mouth 3 (three) times daily as needed for dizziness. 06/14/17   Dione Booze, MD  metFORMIN (GLUCOPHAGE) 500 MG tablet Take 1 tablet (500 mg total) by mouth 2 (two) times daily with a meal. 10/15/15   Johna Sheriff, MD  metoCLOPramide (REGLAN) 10 MG tablet Take 1 tablet (10 mg total) by mouth every 6 (six) hours as needed for nausea (or headache). 06/14/17   Dione Booze, MD  traMADol (ULTRAM) 50 MG tablet Take 1 tablet (50 mg total) by mouth every 6 (six) hours as needed. 06/14/17   Dione Booze, MD    Family History History reviewed. No pertinent family history.  Social History Social History  Substance Use Topics  . Smoking status: Current Every Day Smoker    Packs/day: 0.50    Types: Cigarettes    Start date: 08/29/1999  . Smokeless tobacco: Never Used  . Alcohol use No     Allergies   Ace inhibitors   Review of Systems Review of Systems  HENT: Positive for ear pain.   All other systems reviewed and are negative.    Physical Exam Updated Vital Signs BP (!) 214/116 (BP Location: Left Arm)   Pulse 95   Temp 98.1 F (36.7 C) (Oral)   Resp 20   Ht 5\' 2"  (1.575 m)   Wt 65.8 kg (145 lb)   LMP 05/04/2017   SpO2 97%   BMI 26.52 kg/m   Physical Exam  Nursing note and vitals reviewed.  51 year old female, resting comfortably and in no acute  distress. Vital signs are significant for hypertension. Oxygen saturation is 97%, which is normal. Head is normocephalic and atraumatic. PERRLA, EOMI. Oropharynx is clear. Tympanic membranes are clear. There is no pain elicited when tension is applied to the helix. Neck is nontender and supple without adenopathy or JVD. Back is nontender and there is no CVA tenderness. Lungs are clear without rales, wheezes, or rhonchi. Chest is nontender. Heart has regular rate and rhythm without murmur. Abdomen is soft, flat, nontender without masses or hepatosplenomegaly and peristalsis is normoactive. Extremities have no cyanosis or edema, full range of motion is present. Skin is warm and dry without rash. Neurologic: Mental status is normal, cranial nerves are intact, there are no motor or sensory deficits.  ED Treatments / Results    Procedures Procedures (including critical care time)  Medications Ordered in ED Medications  amLODipine (NORVASC) tablet 10 mg (10 mg Oral Given 06/14/17 0650)  losartan (COZAAR) tablet 50 mg (50 mg Oral Given 06/14/17 [id number])     Initial Impression / Assessment and Plan / ED Course  I have reviewed the triage vital signs and the nursing notes.  Left otalgia of uncertain cause. No evidence of otitis media or externa. Poorly controlled hypertension secondary to medication noncompliance. Old records are reviewed, and she has been followed for elevated blood pressure and diabetes, one prior ED visit for vertigo. Extensive discussion was held with patient regarding importance of proper treatment of hypertension. She is given new prescriptions for her any type or tense of medication and is given financial resource guide to help her establish primary care that she can afford. She is concerned about developing vertigo, so prescription is also given for meclizine and metoclopramide. She's given a prescription for tramadol for pain, but advised to use acetaminophen as her main analgesic. Patient expresses understanding.  Final Clinical Impressions(s) / ED Diagnoses   Final diagnoses:  Otalgia of left ear  Essential hypertension    New Prescriptions New Prescriptions   MECLIZINE (ANTIVERT) 25 MG TABLET    Take 1 tablet (25 mg total) by mouth 3 (three) times daily as needed for dizziness.   METOCLOPRAMIDE (REGLAN) 10 MG TABLET    Take 1 tablet (10 mg total) by mouth every 6 (six) hours as needed for nausea (or headache).   TRAMADOL (ULTRAM) 50 MG TABLET    Take 1 tablet (50 mg total) by mouth every 6 (six) hours as needed.     Dione BoozeGlick, Malekai Markwood, MD 06/14/17 0730

## 2017-06-14 NOTE — Discharge Instructions (Signed)
It is very important for you to keep your blood pressure under control. Poorly controlled blood pressure leads 2 heart attacks, strokes, kidney failure the medications you were on are not [facility] However, [facility] (goodrx.com) can help you out with free coupons which keep the cost of the medication reasonable. When you get established with a PCP, you can work with them to see if your treatment can be switched to items on the $4 list.

## 2017-06-24 ENCOUNTER — Encounter (HOSPITAL_COMMUNITY): Payer: Self-pay | Admitting: Emergency Medicine

## 2017-06-24 ENCOUNTER — Encounter (HOSPITAL_COMMUNITY): Payer: Self-pay | Admitting: *Deleted

## 2017-06-24 ENCOUNTER — Emergency Department (HOSPITAL_COMMUNITY)
Admission: EM | Admit: 2017-06-24 | Discharge: 2017-06-24 | Disposition: A | Discharge status: Home / Self Care | Payer: Self-pay | Attending: Emergency Medicine | Admitting: Emergency Medicine

## 2017-06-24 ENCOUNTER — Emergency Department (HOSPITAL_COMMUNITY): Payer: Self-pay

## 2017-06-24 ENCOUNTER — Inpatient Hospital Stay (HOSPITAL_COMMUNITY)
Admission: EM | Admit: 2017-06-24 | Discharge: 2017-06-27 | DRG: 065 | Disposition: A | Discharge status: Home / Self Care | Payer: Self-pay | Attending: Internal Medicine | Admitting: Internal Medicine

## 2017-06-24 DIAGNOSIS — I639 Cerebral infarction, unspecified: Secondary | ICD-10-CM | POA: Diagnosis present

## 2017-06-24 DIAGNOSIS — I10 Essential (primary) hypertension: Secondary | ICD-10-CM | POA: Diagnosis present

## 2017-06-24 DIAGNOSIS — I6340 Cerebral infarction due to embolism of unspecified cerebral artery: Principal | ICD-10-CM | POA: Diagnosis present

## 2017-06-24 DIAGNOSIS — F17210 Nicotine dependence, cigarettes, uncomplicated: Secondary | ICD-10-CM | POA: Diagnosis present

## 2017-06-24 DIAGNOSIS — Z720 Tobacco use: Secondary | ICD-10-CM | POA: Diagnosis present

## 2017-06-24 DIAGNOSIS — R29700 NIHSS score 0: Secondary | ICD-10-CM | POA: Diagnosis present

## 2017-06-24 DIAGNOSIS — Z8249 Family history of ischemic heart disease and other diseases of the circulatory system: Secondary | ICD-10-CM

## 2017-06-24 DIAGNOSIS — Z9114 Patient's other noncompliance with medication regimen: Secondary | ICD-10-CM

## 2017-06-24 DIAGNOSIS — Z888 Allergy status to other drugs, medicaments and biological substances status: Secondary | ICD-10-CM

## 2017-06-24 DIAGNOSIS — Z5321 Procedure and treatment not carried out due to patient leaving prior to being seen by health care provider: Secondary | ICD-10-CM | POA: Insufficient documentation

## 2017-06-24 DIAGNOSIS — H9202 Otalgia, left ear: Secondary | ICD-10-CM | POA: Diagnosis present

## 2017-06-24 DIAGNOSIS — E785 Hyperlipidemia, unspecified: Secondary | ICD-10-CM | POA: Diagnosis present

## 2017-06-24 DIAGNOSIS — I161 Hypertensive emergency: Secondary | ICD-10-CM | POA: Diagnosis present

## 2017-06-24 DIAGNOSIS — E1165 Type 2 diabetes mellitus with hyperglycemia: Secondary | ICD-10-CM | POA: Diagnosis present

## 2017-06-24 DIAGNOSIS — H532 Diplopia: Secondary | ICD-10-CM | POA: Diagnosis present

## 2017-06-24 DIAGNOSIS — Z79899 Other long term (current) drug therapy: Secondary | ICD-10-CM

## 2017-06-24 LAB — CBC
HEMATOCRIT: 46.8 % — AB (ref 36.0–46.0)
Hemoglobin: 16.2 g/dL — ABNORMAL HIGH (ref 12.0–15.0)
MCH: 32.7 pg (ref 26.0–34.0)
MCHC: 34.6 g/dL (ref 30.0–36.0)
MCV: 94.4 fL (ref 78.0–100.0)
Platelets: 171 10*3/uL (ref 150–400)
RBC: 4.96 MIL/uL (ref 3.87–5.11)
RDW: 13.1 % (ref 11.5–15.5)
WBC: 7.5 10*3/uL (ref 4.0–10.5)

## 2017-06-24 LAB — BASIC METABOLIC PANEL
ANION GAP: 9 (ref 5–15)
BUN: 12 mg/dL (ref 6–20)
CO2: 27 mmol/L (ref 22–32)
Calcium: 10.1 mg/dL (ref 8.9–10.3)
Chloride: 105 mmol/L (ref 101–111)
Creatinine, Ser: 0.57 mg/dL (ref 0.44–1.00)
Glucose, Bld: 284 mg/dL — ABNORMAL HIGH (ref 65–99)
POTASSIUM: 3.4 mmol/L — AB (ref 3.5–5.1)
SODIUM: 141 mmol/L (ref 135–145)

## 2017-06-24 MED ORDER — ASPIRIN 325 MG PO TABS
325.0000 mg | ORAL_TABLET | Freq: Once | ORAL | Status: AC
  Administered 2017-06-25: 325 mg via ORAL
  Filled 2017-06-24: qty 1

## 2017-06-24 MED ORDER — LABETALOL HCL 5 MG/ML IV SOLN
20.0000 mg | Freq: Once | INTRAVENOUS | Status: AC
  Administered 2017-06-24: 20 mg via INTRAVENOUS
  Filled 2017-06-24: qty 4

## 2017-06-24 NOTE — ED Triage Notes (Signed)
Patient c/o left ear pain x2 weeks. Denies any drainage or fevers. Per patient pressure behind left ear. Patient also reports having constant "double vision" that started after the ear pain.

## 2017-06-24 NOTE — ED Notes (Signed)
Pt reports that she has started her blood pressure medication two weeks ago,

## 2017-06-24 NOTE — ED Triage Notes (Signed)
Pt c/o left ear ache and double vision for the past few days, bp 205/103 in triage, pt reports that she has taken her medication for today,

## 2017-06-24 NOTE — ED Provider Notes (Signed)
[facility] Provider Note   CSN: [id number] Arrival date & time: 06/24/17  1909    History   Chief Complaint Chief Complaint  Patient presents with  . Otalgia  . Eye Problem    HPI Tammy Bauer is a 51 y.o. female with a past medical history significant for HTN who presents today with left ear pain and double vision. Patient reports that she start developing ear pain and diplopia on Friday. patient had similar presentation two weeks ago and was seen in the ED and found to have elevated blood pressure. Patient was started on losartan in addition to her amlodipine. Patient admit that she has not been able to fill her losartan because she does not have insurance and is not currently working and cannot afford it. Patient denies any  headaches, CP, SOB, dizziness, abdominal pain or problem with gait.  HPI  Past Medical History:  Diagnosis Date  . Hypertension     Patient Active Problem List   Diagnosis Date Noted  . Essential hypertension 10/07/2015  . Hyperglycemia 10/07/2015    History reviewed. No pertinent surgical history.  OB History    No data available       Home Medications    Prior to Admission medications   Medication Sig Start Date End Date Taking? Authorizing Provider  acetaminophen (TYLENOL) 325 MG tablet Take 650 mg by mouth every 6 (six) hours as needed for mild pain or moderate pain.    Yes [provider]  amLODipine (NORVASC) 10 MG tablet TAKE ONE TABLET BY MOUTH ONCE DAILY 06/14/17  Yes Dione Booze, MD  losartan (COZAAR) 50 MG tablet Take 1 tablet (50 mg total) by mouth daily. Patient not taking: Reported on 06/24/2017 06/14/17   Dione Booze, MD  meclizine (ANTIVERT) 25 MG tablet Take 1 tablet (25 mg total) by mouth 3 (three) times daily as needed for dizziness. Patient not taking: Reported on 06/24/2017 06/14/17   Dione Booze, MD  metoCLOPramide (REGLAN) 10 MG tablet Take 1 tablet (10 mg total) by mouth every 6 (six) hours as needed for  nausea (or headache). Patient not taking: Reported on 06/24/2017 06/14/17   Dione Booze, MD  traMADol (ULTRAM) 50 MG tablet Take 1 tablet (50 mg total) by mouth every 6 (six) hours as needed. Patient not taking: Reported on 06/24/2017 06/14/17   Dione Booze, MD    Family History No family history on file.  Social History Social History  Substance Use Topics  . Smoking status: Current Every Day Smoker    Packs/day: 0.50    Types: Cigarettes    Start date: 08/29/1999  . Smokeless tobacco: Never Used  . Alcohol use No     Allergies   Ace inhibitors   Review of Systems Review of Systems  Constitutional: Negative.   HENT: Positive for ear pain.   Eyes: Positive for visual disturbance.       Diplopia  Respiratory: Negative.   Cardiovascular: Negative.   Gastrointestinal: Negative.   Endocrine: Negative.   Genitourinary: Negative.   Musculoskeletal: Negative.   Skin: Negative.   Allergic/Immunologic: Negative.   Neurological: Negative.   Hematological: Negative.   Psychiatric/Behavioral: Negative.     Physical Exam Updated Vital Signs BP (!) 179/94   Pulse 83   Temp 98 F (36.7 C) (Oral)   Resp 20   Ht 5\' 2"  (1.575 m)   Wt 65.8 kg (145 lb)   LMP 06/03/2017   SpO2 95%   BMI 26.52 kg/m  Physical Exam General: NAD, pleasant, able to participate in exam Cardiac: RRR, normal heart sounds, no murmurs. 2+ radial and PT pulses bilaterally HEENT: On visual acuity exam, patient endorses double vision, EOM normal, conjunctiva is normal, PERRL. Left ear exam negative for acute findings, no erythema, no bulging tympanic membrane, no drainage.  Respiratory: CTAB, normal effort, No wheezes, rales or rhonchi Abdomen: soft, nontender, nondistended, no hepatic or splenomegaly, +BS Extremities: no edema or cyanosis. WWP. Skin: warm and dry, no rashes noted Neuro: alert and oriented x4, cranial nerve II-XII intact no focal deficits Psych: Normal affect and mood  ED Treatments  / Results  Labs (all labs ordered are listed, but only abnormal results are displayed) Labs Reviewed  BASIC METABOLIC PANEL - Abnormal; Notable for the following:       Result Value   Potassium 3.4 (*)    Glucose, Bld 284 (*)    All other components within normal limits  CBC - Abnormal; Notable for the following:    Hemoglobin 16.2 (*)    HCT 46.8 (*)    All other components within normal limits    EKG  EKG Interpretation None       Radiology Ct Head Wo Contrast  Result Date: 06/24/2017 CLINICAL DATA:  Hypertension with double vision EXAM: CT HEAD WITHOUT CONTRAST TECHNIQUE: Contiguous axial images were obtained from the base of the skull through the vertex without intravenous contrast. COMPARISON:  05/22/2015 FINDINGS: Brain: No hemorrhage or intracranial mass is seen. There is hypodensity within the left temporal lobe an left insula/ sub insula felt consistent with remote infarct. Small lacunar infarct within the left parasagittal frontal lobe. No midline shift. The ventricles are nonenlarged. Possible subtle hypodensity within the left occipital lobe. Vascular: No hyperdense vessels.  Carotid artery calcifications. Skull: No fracture or suspicious bone lesion. Sinuses/Orbits: Minimal mucosal thickening in the ethmoid sinuses. No acute orbital abnormality. Other: None IMPRESSION: 1. Negative for acute intracranial hemorrhage 2. 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 3. Mild focal hypodensity within the left parasagittal occipital lobe, cannot exclude small area of edema/acute infarct, suggest MRI for further evaluation. Electronically Signed   By: Jasmine Pang M.D.   On: 06/24/2017 21:25    Procedures Procedures (including critical care time)  Medications Ordered in ED Medications  aspirin tablet 325 mg (not administered)  labetalol (NORMODYNE,TRANDATE) injection 20 mg (20 mg Intravenous Given 06/24/17 2016)    Initial  Impression / Assessment and Plan / ED Course  I have reviewed the triage vital signs and the nursing notes.  Pertinent labs & imaging results that were available during my care of the patient were reviewed by me and considered in my medical decision making (see chart for details).     Patient present with left ear pain and diplopia in the setting of elevated blood pressure (205/103 on admission) consistent with hypertensive emergency. Patient neuro exam is unremarkable except for diplopia. Given otalgia,vision change on admission and elevated BP concern for end organ damage. Head CT showed, Mild focal hypodensity within the left parasagittal occipital Lobe concerning for small area of edema/acute infarct. MRI was recommended. Dr. Otelia Limes from Neurology was consulted and he confirmed imaging finding and need for stroke workup. Hospitalist service consulted and they will admit patient for MRI in the morning and further cardiac and neuro workup. Patient expressed understanding and is in agreement with plan.  Final Clinical Impressions(s) / ED Diagnoses   Final diagnoses:  Hypertensive emergency  Left ear pain  Diplopia  Occipital stroke [facility](HCC)    New Prescriptions New Prescriptions   No medications on file     Lovena Neighboursiallo, Nello Corro, MD 06/25/17 [id number]    Blane OharaZavitz, Joshua, MD 06/27/17 [id number]

## 2017-06-24 NOTE — ED Notes (Signed)
Patient transported to CT 

## 2017-06-24 NOTE — Discharge Instructions (Addendum)
Please make sure to make an appointment at [facility] for blood pressure medications assistance and diabetes follow up. Based on your CT scan results make sure you follow up with neurology for an MRI.   Check your blood sugars at least twice a day (1st thing in the AM and vary the 2nd check of the day either before meals or 2 hours after meals).  Blood sugar goals: Before Meals 80-130 mg/dl and 2-hours after meals less than 180 mg/dl.  Call MD if you have blood sugars >250 mg/dl for more than 1-2 days in a row, and increase your Lantus to 7 units.  Stay hydrated with water when your blood sugars are elevated.  If you notice you are excessively thirsty or are urinating frequently, check your blood sugar and if it is elevated > 250, call your doctor.

## 2017-06-24 NOTE — ED Triage Notes (Signed)
Patient told registration that she "was feeling better so she was leaving and would come back if symptoms returned." Patient left campus sight per registration staff.

## 2017-06-25 ENCOUNTER — Inpatient Hospital Stay (HOSPITAL_COMMUNITY): Payer: Self-pay

## 2017-06-25 DIAGNOSIS — Z720 Tobacco use: Secondary | ICD-10-CM

## 2017-06-25 DIAGNOSIS — H9202 Otalgia, left ear: Secondary | ICD-10-CM

## 2017-06-25 DIAGNOSIS — I639 Cerebral infarction, unspecified: Secondary | ICD-10-CM | POA: Diagnosis present

## 2017-06-25 DIAGNOSIS — H532 Diplopia: Secondary | ICD-10-CM

## 2017-06-25 DIAGNOSIS — I161 Hypertensive emergency: Secondary | ICD-10-CM | POA: Diagnosis present

## 2017-06-25 DIAGNOSIS — I361 Nonrheumatic tricuspid (valve) insufficiency: Secondary | ICD-10-CM

## 2017-06-25 LAB — LIPID PANEL
CHOL/HDL RATIO: 5.9 ratio
Cholesterol: 184 mg/dL (ref 0–200)
HDL: 31 mg/dL — ABNORMAL LOW (ref >40)
LDL CALC: 112 mg/dL — AB (ref 0–99)
Triglycerides: 204 mg/dL — ABNORMAL HIGH (ref <150)
VLDL: 41 mg/dL — ABNORMAL HIGH (ref 0–40)

## 2017-06-25 LAB — ECHOCARDIOGRAM COMPLETE
HEIGHTINCHES: 62 in
Weight: 2320 oz

## 2017-06-25 MED ORDER — ASPIRIN 300 MG RE SUPP: 300.0000 mg | Freq: Every day | RECTAL | Status: DC

## 2017-06-25 MED ORDER — SENNOSIDES-DOCUSATE SODIUM 8.6-50 MG PO TABS
1.0000 | ORAL_TABLET | Freq: Every evening | ORAL | Status: DC | PRN
  Filled 2017-06-25: qty 1

## 2017-06-25 MED ORDER — HEPARIN SODIUM (PORCINE) 5000 UNIT/ML IJ SOLN
5000.0000 [IU] | Freq: Three times a day (TID) | INTRAMUSCULAR | Status: DC
  Administered 2017-06-25 – 2017-06-27 (×7): 5000 [IU] via SUBCUTANEOUS
  Filled 2017-06-25 (×7): qty 1

## 2017-06-25 MED ORDER — ASPIRIN 325 MG PO TABS
ORAL_TABLET | ORAL | Status: AC
  Filled 2017-06-25: qty 1

## 2017-06-25 MED ORDER — SODIUM CHLORIDE 0.9 % IV SOLN
INTRAVENOUS | Status: DC
  Administered 2017-06-25 – 2017-06-27 (×2): via INTRAVENOUS

## 2017-06-25 MED ORDER — ATORVASTATIN CALCIUM 40 MG PO TABS
40.0000 mg | ORAL_TABLET | Freq: Every day | ORAL | Status: DC
  Administered 2017-06-25 – 2017-06-27 (×3): 40 mg via ORAL
  Filled 2017-06-25 (×5): qty 1

## 2017-06-25 MED ORDER — LABETALOL HCL 5 MG/ML IV SOLN: 10.0000 mg | INTRAVENOUS | Status: DC | PRN

## 2017-06-25 MED ORDER — ACETAMINOPHEN 325 MG PO TABS: 650.0000 mg | ORAL_TABLET | ORAL | Status: DC | PRN

## 2017-06-25 MED ORDER — ACETAMINOPHEN 160 MG/5ML PO SOLN: 650.0000 mg | ORAL | Status: DC | PRN

## 2017-06-25 MED ORDER — ASPIRIN 325 MG PO TABS
325.0000 mg | ORAL_TABLET | Freq: Every day | ORAL | Status: DC
  Administered 2017-06-25 – 2017-06-27 (×3): 325 mg via ORAL
  Filled 2017-06-25 (×3): qty 1

## 2017-06-25 MED ORDER — STROKE: EARLY STAGES OF RECOVERY BOOK
Freq: Once | Status: DC
  Filled 2017-06-25: qty 1

## 2017-06-25 MED ORDER — ACETAMINOPHEN 650 MG RE SUPP: 650.0000 mg | RECTAL | Status: DC | PRN

## 2017-06-25 NOTE — Evaluation (Signed)
Occupational Therapy Evaluation Patient Details Name: Tammy Bauer MRN: [id number] DOB: 1966/05/17 Today's Date: 06/25/2017    History of Present Illness Pt is a 51 y.o. female who presented to the ED with complaint of double vision. She was found to be hypertensive in the ED. She has a PMH significant for severe HTN, non-compliance with meds due to lack of health coverage, and history of tobacco abuse. CT revealed hypodensity within L parasaggital occipital lobe. MRI revealed acute/subacute nonhemorrhagic infarct involving the superior L temporal lobe, posterior insular cortex, and L frontal operculum.   Clinical Impression   PTA, pt was independent with basic and instrumental ADL tasks managing her medications and finances independently. She presents to OT evaluation with double vision that she reports is steadily improving but not resolved as well as higher level cognitive deficits impacting her ability to participate in IADL tasks at [facility]. She required verbal cues to complete simple pathfinding tasks in a moderately distracting environment and was unable to alternate attention. Pt would benefit from additional OT visit to improve independence and safety with IADL to facilitate return to PLOF. OT will continue to follow while admitted. Do not anticipate need for OT follow-up post-acute D/C but recommended that pt follow-up with opthamologist for further visual assessment.     Follow Up Recommendations  No OT follow up;Supervision - Intermittent    Equipment Recommendations  None recommended by OT    Recommendations for Other Services       Precautions / Restrictions Precautions Precautions: None Restrictions Weight Bearing Restrictions: No      Mobility Bed Mobility Overal bed mobility: Modified Independent                Transfers Overall transfer level: Modified independent                    Balance Overall balance assessment: No apparent balance deficits (not  formally assessed)                                         ADL either performed or assessed with clinical judgement   ADL Overall ADL's : Needs assistance/impaired                                       General ADL Comments: Able to complete basic ADL in hospital setting with overall supervision for safety. Higher level cognitive deficits impacting her safety and independence with IADL tasks such as medication and financial management.      Vision Baseline Vision/History: No visual deficits Patient Visual Report: Diplopia Vision Assessment?: Yes Eye Alignment: Within Functional Limits Ocular Range of Motion: Within Functional Limits Alignment/Gaze Preference: Within Defined Limits Tracking/Visual Pursuits: Able to track stimulus in all quads without difficulty Saccades: Within functional limits Convergence: Within functional limits Visual Fields: No apparent deficits Diplopia Assessment: Objects split side to side;Present all the time/all directions Additional Comments: Pt reports double vision that is improving. She reports dual images side by side but close to each other like a "shadow." Once ambulating in the hallway, she reports that her vision is improved but not back to normal. She reports that double vision is not resolved by closing one eye.      Perception     Praxis Praxis Praxis tested?: Within functional limits  Pertinent Vitals/Pain Pain Assessment: No/denies pain     Hand Dominance Right   Extremity/Trunk Assessment Upper Extremity Assessment Upper Extremity Assessment: Overall WFL for tasks assessed   Lower Extremity Assessment Lower Extremity Assessment: Overall WFL for tasks assessed       Communication Communication Communication: No difficulties   Cognition Arousal/Alertness: Awake/alert Behavior During Therapy: WFL for tasks assessed/performed Overall Cognitive Status: Impaired/Different from baseline Area of  Impairment: Safety/judgement;Awareness;Problem solving;Attention                   Current Attention Level: Selective     Safety/Judgement: Decreased awareness of safety Awareness: Emergent Problem Solving: Slow processing General Comments: When completing cognitive tasks in hallway, pt needing to stop ambulating to process through task. Pt demonstrating decreased higher level cognitive skills specifically in the areas of executive functioning demonstrated by difficulty with pathfinding tasks in hallway requiring VC's to complete. Additionally noted difficulty with problem solving skills in moderately distracting environment.    General Comments       Exercises     Shoulder Instructions      Home Living Family/patient expects to be discharged to:: Private residence Living Arrangements: Spouse/significant other Available Help at Discharge: Family;Available 24 hours/day Type of Home: House Home Access: Stairs to enter Entergy CorporationEntrance Stairs-Number of Steps: 2 Entrance Stairs-Rails: Left Home Layout: One level     Bathroom Shower/Tub: Chief Strategy OfficerTub/shower unit   Bathroom Toilet: Standard     Home Equipment: None      Lives With: Spouse    Prior Functioning/Environment Level of Independence: Independent                 OT Problem List: Impaired vision/perception;Decreased cognition      OT Treatment/Interventions: Self-care/ADL training;Therapeutic activities;Cognitive remediation/compensation;Visual/perceptual remediation/compensation    OT Goals(Current goals can be found in the care plan section) Acute Rehab OT Goals Patient Stated Goal: to go home OT Goal Formulation: With patient Time For Goal Achievement: 07/02/17 Potential to Achieve Goals: Good ADL Goals Additional ADL Goal #1: Pt will independently incorporate 2 strategies to compensate for decreased functional use of vision during standing grooming tasks.  Additional ADL Goal #2: Pt will demonstrate improved  executive functioning skills to complete medication management tasks.   OT Frequency: Min 2X/week   Barriers to D/C:            Co-evaluation              AM-PAC PT "6 Clicks" Daily Activity     Outcome Measure Help from another person eating meals?: None Help from another person taking care of personal grooming?: A Little Help from another person toileting, which includes using toliet, bedpan, or urinal?: A Little Help from another person bathing (including washing, rinsing, drying)?: A Little Help from another person to put on and taking off regular upper body clothing?: None Help from another person to put on and taking off regular lower body clothing?: None 6 Click Score: 21   End of Session Equipment Utilized During Treatment: Gait belt Nurse Communication: Mobility status  Activity Tolerance: Patient tolerated treatment well Patient left: in bed;with call bell/phone within reach;with family/visitor present  OT Visit Diagnosis: Low vision, both eyes (H54.2);Other symptoms and signs involving cognitive function                Time: [id number] OT Time Calculation (min): 16 min Charges:  OT General Charges $OT Visit: 1 Procedure OT Evaluation $OT Eval Low Complexity: 1 Procedure G-Codes:  Doristine Section, MS OTR/L  Pager: [phone]   Doristine Section 06/25/2017, 5:14 PM

## 2017-06-25 NOTE — Consult Note (Signed)
Neurology Consultation  Reason for Consult: Stroke Referring Physician:  Dr. Ella Jubilee  CC: double vision  History is obtained from: patient, husband, chart  HPI: Tammy Bauer is a 51 y.o. female who is a past medical history of hypertension and noncompliance to medication, tobacco abuse who presented to the emergency room at [facility] with complaints of double vision that started about a week ago but can't really worse on Sunday. Next line should reported seeing double from both eyes, images side-to-side with no improvement if she covered one eye. Her blood pressure on arrival systolic was 220s. She was admitted for evaluation of possible posterior reversible encephalopathy syndrome versus stroke. MRI imaging was obtained which revealed evidence of stroke. She was transferred to [facility] because of unavailability of telemetry beds at [facility]. Denies any current headaches. Reports occasional headaches. Next line denies any nausea or vomiting. Next line to managing fevers chills next line denies any weight gain or weight loss. Patient does not report any easy bruisability or bleeding. Denies any clotting disorders. Denies any history of miscarriages.  LKW: At least 7 days ago tpa given?: no, outside window Premorbid modified Rankin scale (mRS): 0 0-Completely asymptomatic and back to baseline post-stroke   ROS: A 14 point ROS was performed and is negative except as noted in the HPI.   Past Medical History:  Diagnosis Date  . Hypertension     No family history on file. Multiple family members have a history of congestive heart failure. No history of strokes or heart attacks.  Social History:   reports that she has been smoking Cigarettes.  She started smoking about 17 years ago. She has been smoking about 0.50 packs per day. She has never used smokeless tobacco. She reports that she does not drink alcohol or use drugs.   Reported smoking at least 1 pack per day. Denies  alcohol or illicit drug use    Current Facility-Administered Medications:  .   stroke: mapping our early stages of recovery book, , Does not apply, Once, Houston Siren, MD .  0.9 %  sodium chloride infusion, , Intravenous, Continuous, Houston Siren, MD, Last Rate: 50 mL/hr at 06/25/17 0339 .  acetaminophen (TYLENOL) tablet 650 mg, 650 mg, Oral, Q4H PRN **OR** acetaminophen (TYLENOL) solution 650 mg, 650 mg, Per Tube, Q4H PRN **OR** acetaminophen (TYLENOL) suppository 650 mg, 650 mg, Rectal, Q4H PRN, Houston Siren, MD .  aspirin suppository 300 mg, 300 mg, Rectal, Daily **OR** aspirin tablet 325 mg, 325 mg, Oral, Daily, Houston Siren, MD, 325 mg at 06/25/17 1047 .  atorvastatin (LIPITOR) tablet 40 mg, 40 mg, Oral, q1800, Arrien, York Ram, MD, 40 mg at 06/25/17 1726 .  heparin injection 5,000 Units, 5,000 Units, Subcutaneous, Q8H, Houston Siren, MD, 5,000 Units at 06/25/17 1048 .  labetalol (NORMODYNE,TRANDATE) injection 10 mg, 10 mg, Intravenous, Q2H PRN, Arrien, York Ram, MD .  senna-docusate (Senokot-S) tablet 1 tablet, 1 tablet, Oral, QHS PRN, Houston Siren, MD  Exam: Current vital signs: BP (!) 183/83 (BP Location: Right Arm)   Pulse 70   Temp 98.5 F (36.9 C) (Oral)   Resp 18   Ht 5\' 2"  (1.575 m)   Wt 65.8 kg (145 lb)   LMP 06/03/2017   SpO2 100%   BMI 26.52 kg/m  Vital signs in last 24 hours: Temp:  [97.9 F (36.6 C)-98.5 F (36.9 C)] 98.5 F (36.9 C) (07/23 1738) Pulse Rate:  [70-89] 70 (07/23 1738) Resp:  [15-28] 18 (07/23 1738) BP: (  148-205)/(83-103) 183/83 (07/23 1738) SpO2:  [95 %-100 %] 100 % (07/23 1738) Weight:  [65.8 kg (145 lb)] 65.8 kg (145 lb) (07/22 1914)  GENERAL: Awake, alert in NAD HEENT: - Normocephalic and atraumatic, dry mm, no LN++, no Thyromegally LUNGS - Clear to auscultation bilaterally with no wheezes CV - S1S2 RRR, no m/r/g, equal pulses bilaterally. ABDOMEN - Soft, nontender, nondistended with normoactive BS Ext: warm, well perfused, intact peripheral  pulses,  No edema  NEURO:  Mental Status: AA&Ox3  Language: speech is clear.  Naming, repetition, fluency, and comprehension intact. Cranial Nerves: PERRL 513mm/brisk. EOMI, visual fields full, no facial asymmetry, facial sensation intact, hearing intact, tongue/uvula/soft palate midline, normal sternocleidomastoid and trapezius muscle strength. No evidence of tongue atrophy or fibrillations Motor: 5/5 strength all over Tone: is normal and bulk is normal Sensation- Intact to light touch bilaterally, no extinction on double sound assimilation Coordination: FTN intact bilaterally, no ataxia in BLE Gait- normal  NIHSS - 0   Labs CBC    Component Value Date/Time   WBC 7.5 06/24/2017 1958   RBC 4.96 06/24/2017 1958   HGB 16.2 (H) 06/24/2017 1958   HCT 46.8 (H) 06/24/2017 1958   PLT 171 06/24/2017 1958   MCV 94.4 06/24/2017 1958   MCH 32.7 06/24/2017 1958   MCHC 34.6 06/24/2017 1958   RDW 13.1 06/24/2017 1958   LYMPHSABS 1.7 05/22/2015 1934   MONOABS 0.6 05/22/2015 1934   EOSABS 0.0 05/22/2015 1934   BASOSABS 0.0 05/22/2015 1934    CMP     Component Value Date/Time   NA 141 06/24/2017 1958   NA 137 10/07/2015 1232   K 3.4 (L) 06/24/2017 1958   CL 105 06/24/2017 1958   CO2 27 06/24/2017 1958   GLUCOSE 284 (H) 06/24/2017 1958   BUN 12 06/24/2017 1958   CREATININE 0.57 06/24/2017 1958   CALCIUM 10.1 06/24/2017 1958   PROT 8.0 04/02/2016 0638   ALBUMIN 4.1 04/02/2016 0638   AST 13 (L) 04/02/2016 0638   ALT 11 (L) 04/02/2016 0638   ALKPHOS 46 04/02/2016 0638   BILITOT 0.7 04/02/2016 0638   GFRNONAA >60 06/24/2017 1958   GFRAA >60 06/24/2017 1958    Hemoglobin A1c 7.7 Lipid Panel     Component Value Date/Time   CHOL 184 06/25/2017 0531   TRIG 204 (H) 06/25/2017 0531   HDL 31 (L) 06/25/2017 0531   CHOLHDL 5.9 06/25/2017 0531   VLDL 41 (H) 06/25/2017 0531   LDLCALC 112 (H) 06/25/2017 0531   Echo: - Left ventricle: The cavity size was normal. Wall thickness was    increased in a pattern of mild LVH. Systolic function was normal.   The estimated ejection fraction was in the range of 55% to 60%.   Wall motion was normal; there were no regional wall motion   abnormalities. - Aortic valve: Valve area (VTI): 2.83 cm^2. Valve area (Vmax):   2.58 cm^2. Valve area (Vmean): 2.46 cm^2. - Technically adequate study No atrial septal defect  Imaging I have reviewed Images in epic and the results pertinent to this consultation are: CT-scan of the brain - hypodensity in the left temporal, posterior insular and left frontal operculum. MRI examination of the brain Acute subacute areas of restricted diffusion in the left temporal lobe, posterior insular cortex and left frontal operculum, chronic white matter disease  MRA of the head shows focal moderate stenosis of the superior left M2 division without focal occlusion. Diffuse athero in anterior and posterior circ.  Assessment:  51 year old woman who presented to the outside hospital for evaluation of double vision ongoing for 7-10 days. Symptoms were attributed to PRES versus stroke. MRI reveals strokes in the left temporal/posterior insular/frontal operculum and MRA shows focal stenosis of the superior division of the left M2. 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 Most likely etiology of these strokes is atheroembolic/large vessel. Given her extensive history of smoking, small vessel etiology can also be considered but the location and appearance is atypical for small vessel disease.   Recommendations: -HgbA1c, fasting lipid panel completed. Her A1c is elevated and so is her LDL. Continue with statin for a goal LDL of less than 70. She needs treatment of her diabetes as an outpatient as well. -Frequent neuro checks -Echocardiogram was completed. Shows mild LVH, no atrial enlargement or PFO/is the. -Prophylactic therapy-Antiplatelet med: Aspirin - dose 325mg  PO.  Final decision on discharge antiplatelet as per stroke team. Consider dual antiplatelets for short-term. -Risk factor modification - had a detailed discussion about smoking cessation with her and her husband. Also had a detailed discussion about the importance of compliance with antihypertensives. -Telemetry monitoring -PT consult, OT consult, Speech consult  -please page stroke NP  Or  PA  Or MD  from 8am -4 pm as this patient will be followed by the stroke team at this point.   You can look them up on [email or web address]    Stroke team will follow the patient in the morning. Please page the stroke team from 8 AM to 4 PM. (details on AMION)  Milon Dikes, MD [facility] [phone]

## 2017-06-25 NOTE — ED Notes (Signed)
Patient transported to Ultrasound 

## 2017-06-25 NOTE — ED Notes (Signed)
Pt leaving now with EMS

## 2017-06-25 NOTE — Progress Notes (Signed)
PROGRESS NOTE    Tammy GaskinsMela Shelton  ZOX:[id number] DOB: 1966/05/31 DOA: 06/24/2017 PCP: Johna SheriffVincent, Carol L, MD    Brief Narrative: 51 year old female who presented with blurry vision and elevated blood pressure. Patient is known to have hypertension, medical noncompliance, tobacco abuse. Presented with headache for the last 48 hours, associated with blurry vision, today with double vision. Started on antihypertensive agent about 7 days ago. On initial physical examination her blood pressure was 205/103, heart rate 89, respiratory 20, temperature 90.8, oxygen saturation 96%. His mucous membranes were moist, his lungs were clear to auscultation bilaterally, no wheezing, rales or rhonchi, heart S1-S2 present rhythmic, abdomen was soft nontender, no lower extremity edema. Sodium 141, potassium 3.4, chloride 105, bicarbonate 27, glucose 284, BUN 12, creatinine 0.57, white count 7.5, hemoglobin 16.2, hematocrit 46.8, platelets 171. Head CT negative for acute intracranial hemorrhage, old appearing infarcts in the left temporal, parietal lobe and adjacent insula. Mild focal hypodensity within the left parasagittal occipital lobe, cannot exclude edema or infarct. EKG normal sinus rhythm, no significant ST or T-wave changes. MRI of the brain with acute/subacute nonhemorrhagic infarct involving the superior left temporal lobe, posterior insular cortex and left frontal operaculum.   Patient admitted, the working diagnosis of hypertensive emergency complicated with acute CVA in the occipital area.   Assessment & Plan:   Active Problems:   Acute CVA (cerebrovascular accident) [facility](HCC)   Hypertensive emergency   Tobacco abuse  1. Hypertensive emergency. Will continue close monitoring of blood pressure, will avoid hypotension in the setting of acute CVA, will target blood pressure above 180 mmHg, will resume as needed IV labetalol.  2. Acute CVA. Positive MRI for acute to subacute ischemic infarct at left superior  temporal lobe, posterior insular cortex and left frontal operaculum. Will continue asa and statin, blood pressure control 180 to 190 systolic. Add statin therapy, US carotids and echocardiogram. Will follow on Neurology recommendations.  LDL 112 and HDL 31.   3. Tobacco abuse. Smoking cessation.    DVT prophylaxis: enoxaparin  Code Status: full  Family Communication: Patient's family at the bedside and all questions were addressed  Disposition Plan: home    Consultants:   Neurology   Procedures:    Antimicrobials:       Subjective: Patient feeling better, no nausea or vomiting, no chest pain or dyspnea. Improving blurry vision.   Objective: Vitals:   06/25/17 0400 06/25/17 0600 06/25/17 0730 06/25/17 0824  BP: (!) 148/95 (!) 160/88  (!) 185/93  Pulse: 72 73 84 85  Resp: (!) 22 (!) 23 15 20   Temp:    97.9 F (36.6 C)  TempSrc:    Oral  SpO2: 99% 96% 98% 98%  Weight:      Height:       No intake or output data in the 24 hours ending 06/25/17 0945 Filed Weights   06/24/17 1914  Weight: 65.8 kg (145 lb)    Examination:  General exam: not in pain or dyspnea E ENT. No pallor or icterus, oral mucosa moist.  Respiratory system: Clear to auscultation. Respiratory effort normal. Cardiovascular system: S1 & S2 heard, RRR. No JVD, murmurs, rubs, gallops or clicks. No pedal edema. Gastrointestinal system: Abdomen is nondistended, soft and nontender. No organomegaly or masses felt. Normal bowel sounds heard. Central nervous system: Alert and oriented. No focal neurological deficits. Extremities: Symmetric 5 x 5 power. Skin: No rashes, lesions or ulcers    Data Reviewed: I have personally reviewed following labs and imaging studies  CBC:  Recent Labs Lab 06/24/17 1958  WBC 7.5  HGB 16.2*  HCT 46.8*  MCV 94.4  PLT 171   Basic Metabolic Panel:  Recent Labs Lab 06/24/17 1958  NA 141  K 3.4*  CL 105  CO2 27  GLUCOSE 284*  BUN 12  CREATININE 0.57    CALCIUM 10.1   GFR: Estimated Creatinine Clearance: 75.7 mL/min (by C-G formula based on SCr of 0.57 mg/dL). Liver Function Tests: No results for input(s): AST, ALT, ALKPHOS, BILITOT, PROT, ALBUMIN in the last 168 hours. No results for input(s): LIPASE, AMYLASE in the last 168 hours. No results for input(s): AMMONIA in the last 168 hours. Coagulation Profile: No results for input(s): INR, PROTIME in the last 168 hours. Cardiac Enzymes: No results for input(s): CKTOTAL, CKMB, CKMBINDEX, TROPONINI in the last 168 hours. BNP (last 3 results) No results for input(s): PROBNP in the last 8760 hours. HbA1C: No results for input(s): HGBA1C in the last 72 hours. CBG: No results for input(s): GLUCAP in the last 168 hours. Lipid Profile:  Recent Labs  06/25/17 0531  CHOL 184  HDL 31*  LDLCALC 112*  TRIG 204*  CHOLHDL 5.9   Thyroid Function Tests: No results for input(s): TSH, T4TOTAL, FREET4, T3FREE, THYROIDAB in the last 72 hours. Anemia Panel: No results for input(s): VITAMINB12, FOLATE, FERRITIN, TIBC, IRON, RETICCTPCT in the last 72 hours. Sepsis Labs: No results for input(s): PROCALCITON, LATICACIDVEN in the last 168 hours.  No results found for this or any previous visit (from the past 240 hour(s)).       Radiology Studies: Ct Head Wo Contrast  Result Date: 06/24/2017 CLINICAL DATA:  Hypertension with double vision EXAM: CT HEAD WITHOUT CONTRAST TECHNIQUE: Contiguous axial images were obtained from the base of the skull through the vertex without intravenous contrast. COMPARISON:  05/22/2015 FINDINGS: Brain: No hemorrhage or intracranial mass is seen. There is hypodensity within the left temporal lobe an left insula/ sub insula felt consistent with remote infarct. Small lacunar infarct within the left parasagittal frontal lobe. No midline shift. The ventricles are nonenlarged. Possible subtle hypodensity within the left occipital lobe. Vascular: No hyperdense vessels.   Carotid artery calcifications. Skull: No fracture or suspicious bone lesion. Sinuses/Orbits: Minimal mucosal thickening in the ethmoid sinuses. No acute orbital abnormality. Other: None IMPRESSION: 1. Negative for acute intracranial hemorrhage 2. 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 3. Mild focal hypodensity within the left parasagittal occipital lobe, cannot exclude small area of edema/acute infarct, suggest MRI for further evaluation. Electronically Signed   By: Jasmine Pang M.D.   On: 06/24/2017 21:25   Mr Brain Wo Contrast  Result Date: 06/25/2017 CLINICAL DATA:  Acute CVA.  Diplopia.  Left ear pain. EXAM: MRI HEAD WITHOUT CONTRAST MRA HEAD WITHOUT CONTRAST TECHNIQUE: Multiplanar, multiecho pulse sequences of the brain and surrounding structures were obtained without intravenous contrast. Angiographic images of the head were obtained using MRA technique without contrast. COMPARISON:  CT head without contrast 06/24/2017 FINDINGS: MRI HEAD FINDINGS Brain: Diffusion-weighted images confirm an acute/subacute nonhemorrhagic infarct involving the posterior insular ribbon, left superior temporal gyrus, and left frontal operculum. There is no associated hemorrhage. Cortical T2 changes are associated. Other scattered subcortical T2 hyperintensities are present bilaterally. There is a remote lacunar infarct involving the left corona radiata. The brainstem and cerebellum are normal. Vascular: Flow is present in the major intracranial arteries. Skull and upper cervical spine: The skullbase is within normal limits. The craniocervical  junction is normal. Midline sagittal structures are unremarkable. Sinuses/Orbits: The globes and orbits are within normal limits. The paranasal sinuses and mastoid air cells are clear. MRA HEAD FINDINGS The internal carotid artery is are within normal limits from the high cervical segments through the ICA termini bilaterally. The A1  and M1 segments are normal. No definite anterior communicating artery is present. The right MCA bifurcation is intact. There is moderate attenuation involving the proximal superior M2 division. The inferior posterior left into deviation is unremarkable. The vertebral arteries are codominant. PICA origins are visualized and normal. The vertebrobasilar junction is normal. Both posterior cerebral arteries originate from the basilar tip. There is some attenuation distal PCA branch vessels. IMPRESSION: 1. Acute/subacute nonhemorrhagic infarct involving the superior left temporal lobe, posterior insular cortex, and left frontal operculum. 2. Other white matter disease and a remote lacunar infarct involving the left corona radiata suggests microvascular disease. 3. Focal moderate stenosis involving the superior left M2 division without focal occlusion. 4. Mild distal branch vessel disease within the posterior cerebral arteries bilaterally. Electronically Signed   By: Marin Roberts M.D.   On: 06/25/2017 08:21   US Carotid Bilateral (at [facility] Only)  Result Date: 06/25/2017 CLINICAL DATA:  Acute CVA. Visual disturbance. Syncopal episode. History of hypertension, hyperlipidemia, diabetes and smoking. EXAM: BILATERAL CAROTID DUPLEX ULTRASOUND TECHNIQUE: Wallace Cullens scale imaging, color Doppler and duplex ultrasound were performed of bilateral carotid and vertebral arteries in the neck. COMPARISON:  None. FINDINGS: Criteria: Quantification of carotid stenosis is based on velocity parameters that correlate the residual internal carotid diameter with NASCET-based stenosis levels, using the diameter of the distal internal carotid lumen as the denominator for stenosis measurement. The following velocity measurements were obtained: RIGHT ICA:  72/31 cm/sec CCA:  64/22 cm/sec SYSTOLIC ICA/CCA RATIO:  1.1 DIASTOLIC ICA/CCA RATIO:  1.4 ECA:  92 cm/sec LEFT ICA:  51/17 cm/sec CCA:  71/21 cm/sec SYSTOLIC ICA/CCA RATIO:  0.7  DIASTOLIC ICA/CCA RATIO:  0.8 ECA:  68 cm/sec RIGHT CAROTID ARTERY: There is a minimal amount of eccentric mixed echogenic plaque seen scattered throughout the right common carotid artery. There is a moderate amount of eccentric mixed echogenic plaque within the right carotid bulb (image 17), extending to involve the origin and proximal aspects of the right internal carotid artery (image 25), not definitely resulting in elevated peak systolic velocities within the interrogated course the right internal carotid artery to suggest a hemodynamically significant stenosis. Elevated peak systolic velocity within the distal aspect the right internal carotid artery is felt to be factitiously elevated due to sampling at a location of turbulent flow and tortuosity. RIGHT VERTEBRAL ARTERY:  Antegrade Flow LEFT CAROTID ARTERY: There is a minimal amount of intimal thickening throughout the left common carotid artery. There is a minimal amount of intimal thickening and hypoechoic plaque within the left carotid bulb (image 52), extending to involve the origin and proximal aspects of the left internal carotid artery (image 60), not resulting in elevated peak systolic velocities within the interrogated course of the left internal carotid artery to suggest a hemodynamically significant stenosis. LEFT VERTEBRAL ARTERY:  Antegrade flow IMPRESSION: Minimal to moderate amount of bilateral atherosclerotic plaque, right greater than left, not resulting in a hemodynamically significant stenosis within either internal carotid artery. Electronically Signed   By: Simonne Come M.D.   On: 06/25/2017 08:45   Mr Maxine Glenn Head/brain ZO Cm  Result Date: 06/25/2017 CLINICAL DATA:  Acute CVA.  Diplopia.  Left ear pain. EXAM: MRI HEAD  WITHOUT CONTRAST MRA HEAD WITHOUT CONTRAST TECHNIQUE: Multiplanar, multiecho pulse sequences of the brain and surrounding structures were obtained without intravenous contrast. Angiographic images of the head were obtained  using MRA technique without contrast. COMPARISON:  CT head without contrast 06/24/2017 FINDINGS: MRI HEAD FINDINGS Brain: Diffusion-weighted images confirm an acute/subacute nonhemorrhagic infarct involving the posterior insular ribbon, left superior temporal gyrus, and left frontal operculum. There is no associated hemorrhage. Cortical T2 changes are associated. Other scattered subcortical T2 hyperintensities are present bilaterally. There is a remote lacunar infarct involving the left corona radiata. The brainstem and cerebellum are normal. Vascular: Flow is present in the major intracranial arteries. Skull and upper cervical spine: The skullbase is within normal limits. The craniocervical junction is normal. Midline sagittal structures are unremarkable. Sinuses/Orbits: The globes and orbits are within normal limits. The paranasal sinuses and mastoid air cells are clear. MRA HEAD FINDINGS The internal carotid artery is are within normal limits from the high cervical segments through the ICA termini bilaterally. The A1 and M1 segments are normal. No definite anterior communicating artery is present. The right MCA bifurcation is intact. There is moderate attenuation involving the proximal superior M2 division. The inferior posterior left into deviation is unremarkable. The vertebral arteries are codominant. PICA origins are visualized and normal. The vertebrobasilar junction is normal. Both posterior cerebral arteries originate from the basilar tip. There is some attenuation distal PCA branch vessels. IMPRESSION: 1. Acute/subacute nonhemorrhagic infarct involving the superior left temporal lobe, posterior insular cortex, and left frontal operculum. 2. Other white matter disease and a remote lacunar infarct involving the left corona radiata suggests microvascular disease. 3. Focal moderate stenosis involving the superior left M2 division without focal occlusion. 4. Mild distal branch vessel disease within the  posterior cerebral arteries bilaterally. Electronically Signed   By: Marin Roberts M.D.   On: 06/25/2017 08:21        Scheduled Meds: .  stroke: mapping our early stages of recovery book   Does not apply Once  . aspirin  300 mg Rectal Daily   Or  . aspirin  325 mg Oral Daily  . heparin  5,000 Units Subcutaneous Q8H   Continuous Infusions: . sodium chloride 50 mL/hr at 06/25/17 0339     LOS: 0 days        Coralie Keens, MD [facility] Pager [phone]  If 7PM-7AM, please contact night-coverage [email or web address] Password [facility] 06/25/2017, 9:45 AM

## 2017-06-25 NOTE — H&P (Signed)
History and Physical    Tammy Bauer ZOX:[id number] DOB: 1965/12/17 DOA: 06/24/2017  PCP: Johna SheriffVincent, Carol L, MD  Patient coming from: Home.    Chief Complaint: Blurry vision and HTN.  HPI: Tammy Bauer is an 51 y.o. female with hx of severe HTN, non compliance with meds due to lack of health coverage, hx of tobacco abuse, presented to the ER with complaint of double vision.  She was found to have severe HTN with SBP 205 and diastolic BP of 103.  CT of her head showed hypodense in the left occipital area, unclear if acute CVA, and old CVAs.  Neurology was consulted, recommended admission for acute stroke work up.  Of note, the "double vision" she complained was more consistent with blurry vision than true double vision.  When she close either her left eye or right eye, the "double vision" persisted, but it is more like a "shadow" than double vision.  It is in the horizontal plane. She was given ASA and one dose of labetelol, bringing her BP to about 180-190.  ED Course:  See above.  Rewiew of Systems:  Constitutional: Negative for malaise, fever and chills. No significant weight loss or weight gain Eyes: Negative for eye pain, redness and discharge, diplopia, visual changes, or flashes of light. ENMT: Negative for ear pain, hoarseness, nasal congestion, sinus pressure and sore throat. No headaches; tinnitus, drooling, or problem swallowing. Cardiovascular: Negative for chest pain, palpitations, diaphoresis, dyspnea and peripheral edema. ; No orthopnea, PND Respiratory: Negative for cough, hemoptysis, wheezing and stridor. No pleuritic chestpain. Gastrointestinal: Negative for diarrhea, constipation,  melena, blood in stool, hematemesis, jaundice and rectal bleeding.    Genitourinary: Negative for frequency, dysuria, incontinence,flank pain and hematuria; Musculoskeletal: Negative for back pain and neck pain. Negative for swelling and trauma.;  Skin: . Negative for pruritus, rash, abrasions,  bruising and skin lesion.; ulcerations Neuro: Negative for headache, lightheadedness and neck stiffness. Negative for weakness, altered level of consciousness , altered mental status, extremity weakness, burning feet, involuntary movement, seizure and syncope.  Psych: negative for anxiety, depression, insomnia, tearfulness, panic attacks, hallucinations, paranoia, suicidal or homicidal ideation    Past Medical History:  Diagnosis Date  . Hypertension     History reviewed. No pertinent surgical history.   reports that she has been smoking Cigarettes.  She started smoking about 17 years ago. She has been smoking about 0.50 packs per day. She has never used smokeless tobacco. She reports that she does not drink alcohol or use drugs.  Allergies  Allergen Reactions  . Ace Inhibitors Cough    No family history on file.   Prior to Admission medications   Medication Sig Start Date End Date Taking? Authorizing Provider  acetaminophen (TYLENOL) 325 MG tablet Take 650 mg by mouth every 6 (six) hours as needed for mild pain or moderate pain.    Yes [provider]  amLODipine (NORVASC) 10 MG tablet TAKE ONE TABLET BY MOUTH ONCE DAILY 06/14/17  Yes Dione BoozeGlick, David, MD  losartan (COZAAR) 50 MG tablet Take 1 tablet (50 mg total) by mouth daily. Patient not taking: Reported on 06/24/2017 06/14/17   Dione BoozeGlick, David, MD  meclizine (ANTIVERT) 25 MG tablet Take 1 tablet (25 mg total) by mouth 3 (three) times daily as needed for dizziness. Patient not taking: Reported on 06/24/2017 06/14/17   Dione BoozeGlick, David, MD  metoCLOPramide (REGLAN) 10 MG tablet Take 1 tablet (10 mg total) by mouth every 6 (six) hours as needed for nausea (or headache). Patient  not taking: Reported on 06/24/2017 06/14/17   Dione Booze, MD  traMADol (ULTRAM) 50 MG tablet Take 1 tablet (50 mg total) by mouth every 6 (six) hours as needed. Patient not taking: Reported on 06/24/2017 06/14/17   Dione Booze, MD    Physical Exam: Vitals:    06/24/17 1913 06/24/17 1914 06/24/17 2020 06/24/17 2030  BP: (!) 205/103  (!) 169/96 (!) 179/94  Pulse: 89  84 83  Resp: 20     Temp: 98 F (36.7 C)     TempSrc: Oral     SpO2: 96%  96% 95%  Weight:  65.8 kg (145 lb)    Height:  5\' 2"  (1.575 m)        Constitutional: NAD, calm, comfortable Vitals:   06/24/17 1913 06/24/17 1914 06/24/17 2020 06/24/17 2030  BP: (!) 205/103  (!) 169/96 (!) 179/94  Pulse: 89  84 83  Resp: 20     Temp: 98 F (36.7 C)     TempSrc: Oral     SpO2: 96%  96% 95%  Weight:  65.8 kg (145 lb)    Height:  5\' 2"  (1.575 m)     Eyes: PERRL, lids and conjunctivae normal ENMT: Mucous membranes are moist. Posterior pharynx clear of any exudate or lesions.Normal dentition.  Neck: normal, supple, no masses, no thyromegaly Respiratory: clear to auscultation bilaterally, no wheezing, no crackles. Normal respiratory effort. No accessory muscle use.  Cardiovascular: Regular rate and rhythm, no murmurs / rubs / gallops. No extremity edema. 2+ pedal pulses. No carotid bruits.  Abdomen: no tenderness, no masses palpated. No hepatosplenomegaly. Bowel sounds positive.  Musculoskeletal: no clubbing / cyanosis. No joint deformity upper and lower extremities. Good ROM, no contractures. Normal muscle tone.  Skin: no rashes, lesions, ulcers. No induration Neurologic: CN 2-12 grossly intact. Sensation intact, DTR normal. Strength 5/5 in all 4.  Psychiatric: Normal judgment and insight. Alert and oriented x 3. Normal mood.   Labs on Admission: I have personally reviewed following labs and imaging studies CBC:  Recent Labs Lab 06/24/17 1958  WBC 7.5  HGB 16.2*  HCT 46.8*  MCV 94.4  PLT 171   Basic Metabolic Panel:  Recent Labs Lab 06/24/17 1958  NA 141  K 3.4*  CL 105  CO2 27  GLUCOSE 284*  BUN 12  CREATININE 0.57  CALCIUM 10.1   GFR: Urine analysis:    Component Value Date/Time   COLORURINE YELLOW 04/02/2016 0638   APPEARANCEUR CLEAR 04/02/2016 0638    LABSPEC 1.015 04/02/2016 0638   PHURINE 6.0 04/02/2016 0638   GLUCOSEU NEGATIVE 04/02/2016 0638   HGBUR NEGATIVE 04/02/2016 0638   BILIRUBINUR NEGATIVE 04/02/2016 0638   KETONESUR 15 (A) 04/02/2016 0638   PROTEINUR NEGATIVE 04/02/2016 0638   UROBILINOGEN 1.0 05/22/2015 2045   NITRITE NEGATIVE 04/02/2016 0638   LEUKOCYTESUR SMALL (A) 04/02/2016 0638   Radiological Exams on Admission: Ct Head Wo Contrast  Result Date: 06/24/2017 CLINICAL DATA:  Hypertension with double vision EXAM: CT HEAD WITHOUT CONTRAST TECHNIQUE: Contiguous axial images were obtained from the base of the skull through the vertex without intravenous contrast. COMPARISON:  05/22/2015 FINDINGS: Brain: No hemorrhage or intracranial mass is seen. There is hypodensity within the left temporal lobe an left insula/ sub insula felt consistent with remote infarct. Small lacunar infarct within the left parasagittal frontal lobe. No midline shift. The ventricles are nonenlarged. Possible subtle hypodensity within the left occipital lobe. Vascular: No hyperdense vessels.  Carotid artery calcifications. Skull: No fracture  or suspicious bone lesion. Sinuses/Orbits: Minimal mucosal thickening in the ethmoid sinuses. No acute orbital abnormality. Other: None IMPRESSION: 1. Negative for acute intracranial hemorrhage 2. 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 3. Mild focal hypodensity within the left parasagittal occipital lobe, cannot exclude small area of edema/acute infarct, suggest MRI for further evaluation. Electronically Signed   By: Jasmine Pang M.D.   On: 06/24/2017 21:25    EKG: Independently reviewed.   Assessment/Plan Active Problems:   Acute CVA (cerebrovascular accident) [facility])   Hypertensive emergency   Tobacco abuse   PLAN:   Hypertensive emergency with possible acute CVA in the occipital area:  Her ictus has been over 24 hours.  I am not convinced she has diplopia, as  she has monocular "diplopia" in both the left and the right eyes.  She has more blurry vision than diplopia. She was describing a Shadow rather than true 2 images.  Will admit her to telemetry.  Give ASA, and complete stroke work up to include MRA/MRA brain, ECHO and carotid.    HTN:  Will give only Norvasc at 5mg  per day, in fear of precipitous drop in her BP.  Goal will be at 180 systolic.  Tobacco abuse:  Advised stop.   DVT prophylaxis: subQ heparin.  Code Status: FULL CODE.  Family Communication: husband at bedside.  Disposition Plan: Home.  Consults called: None. Admission status: OBS>    Shaeley Segall MD FACP. [facility]  If 7PM-7AM, please contact night-coverage [email or web address] Password [facility]  06/25/2017, 12:22 AM

## 2017-06-25 NOTE — ED Notes (Signed)
Pt awake, denies any complaints, family remains at bedside, updated on plan of care,

## 2017-06-25 NOTE — ED Notes (Signed)
Patient transported to MRI 

## 2017-06-25 NOTE — Evaluation (Signed)
Speech Language Pathology Evaluation Patient Details Name: Lucia GaskinsMela Shelton MRN: [id number] DOB: 24-Mar-1966 Today's Date: 06/25/2017 Time: [id number] SLP Time Calculation (min) (ACUTE ONLY): 35 min  Problem List:  Patient Active Problem List   Diagnosis Date Noted  . Acute CVA (cerebrovascular accident) (HCC) 06/25/2017  . Hypertensive emergency 06/25/2017  . Tobacco abuse 06/25/2017  . CVA (cerebral vascular accident) (HCC) 06/25/2017  . Essential hypertension 10/07/2015  . Hyperglycemia 10/07/2015   Past Medical History:  Past Medical History:  Diagnosis Date  . Hypertension    Past Surgical History: History reviewed. No pertinent surgical history.   HPI:  51 year old female admitted 06/24/17 with double/blurred vision and HTN. PMH significant for severe HTN, medcal noncompliance (due to no insurance), + tobacco. CT = hypodensity Left occipital lobe. MRI - (sub)acute nonhemorrhagic infarct in the superior left temporal lobe, posterior insular cortex, and left frontal operculum   Assessment / Plan / Recommendation Clinical Impression  The Montreal Cognitive Assessment (MoCA) was administered. Pt scored 23/30 (n=26+/30), indicating mild cognitive impairment for pt age and level of education (2 years college). Difficulty noted on the following subtests: Executive functions, immediate and delayed recall, thought organization, abstract reasoning, and visuoperception. Pt was encouraged to notify PCP if difficulties arise once she returns to her normal routine. No further ST intervention is recommended at this level of care. All needs can be addressed at next venue, via home health or outpatient speech therapy, if needed.    SLP Assessment  SLP Recommendation/Assessment: All further Speech Language Pathology needs can be addressed in the next venue of care  SLP Visit Diagnosis: Cognitive communication deficit (R41.841)    Follow Up Recommendations  Home health SLP;Outpatient SLP (if needs  arise)       SLP Evaluation Cognition  Overall Cognitive Status: Impaired/Different from baseline Arousal/Alertness: Awake/alert Orientation Level: Oriented X4 Attention: Focused;Sustained Focused Attention: Appears intact Sustained Attention: Appears intact Memory: Impaired Memory Impairment: Storage deficit;Retrieval deficit;Decreased recall of new information;Decreased short term memory Decreased Short Term Memory: Verbal basic Problem Solving: Impaired Executive Function: Sequencing;Reasoning;Organizing Reasoning: Impaired Reasoning Impairment: Verbal basic Sequencing: Impaired Sequencing Impairment: Verbal basic Organizing: Impaired Organizing Impairment: Verbal basic       Comprehension  Auditory Comprehension Overall Auditory Comprehension: Appears within functional limits for tasks assessed    Expression Expression Primary Mode of Expression: Verbal Verbal Expression Overall Verbal Expression: Appears within functional limits for tasks assessed   Oral / Motor  Oral Motor/Sensory Function Overall Oral Motor/Sensory Function: Within functional limits Motor Speech Overall Motor Speech: Appears within functional limits for tasks assessed   GO                   Fanny Agan B. HutchinsonBueche, [facility] Hospital - Panama CityMSP, CCC-SLP [phone]  Leigh AuroraBueche, Charnise Lovan Brown 06/25/2017, 2:58 PM

## 2017-06-25 NOTE — Progress Notes (Signed)
Pt arrived to 5C08 via stretcher.  Pt alert and oriented, no complaints of pain.  Pt states vision is better, double vision is improving.  No other complaints at this time.  VSS.  Will continue to monitor.  Sondra ComeSilva, Vana Arif M, RN

## 2017-06-25 NOTE — ED Notes (Signed)
Meal tray given 

## 2017-06-25 NOTE — ED Notes (Signed)
In to assess pt and pt in MRI

## 2017-06-25 NOTE — Progress Notes (Signed)
  Echocardiogram 2D Echocardiogram has been performed.  Leta JunglingCooper, Zyaira Vejar M 06/25/2017, 10:19 AM

## 2017-06-25 NOTE — ED Notes (Addendum)
See [facility]. Pt denies pain. Aware awaiting admission bed. Resting. In US at this time. states is having "little" double vision but states is much better.

## 2017-06-26 DIAGNOSIS — I10 Essential (primary) hypertension: Secondary | ICD-10-CM

## 2017-06-26 DIAGNOSIS — I639 Cerebral infarction, unspecified: Secondary | ICD-10-CM

## 2017-06-26 LAB — BASIC METABOLIC PANEL
ANION GAP: 8 (ref 5–15)
BUN: 9 mg/dL (ref 6–20)
CALCIUM: 9.5 mg/dL (ref 8.9–10.3)
CO2: 24 mmol/L (ref 22–32)
Chloride: 102 mmol/L (ref 101–111)
Creatinine, Ser: 0.65 mg/dL (ref 0.44–1.00)
GLUCOSE: 205 mg/dL — AB (ref 65–99)
POTASSIUM: 3.5 mmol/L (ref 3.5–5.1)
SODIUM: 134 mmol/L — AB (ref 135–145)

## 2017-06-26 LAB — CBC WITH DIFFERENTIAL/PLATELET
BASOS ABS: 0 10*3/uL (ref 0.0–0.1)
BASOS PCT: 0 %
EOS ABS: 0.1 10*3/uL (ref 0.0–0.7)
EOS PCT: 1 %
HCT: 45.2 % (ref 36.0–46.0)
Hemoglobin: 15.4 g/dL — ABNORMAL HIGH (ref 12.0–15.0)
Lymphocytes Relative: 47 %
Lymphs Abs: 2.9 10*3/uL (ref 0.7–4.0)
MCH: 31.8 pg (ref 26.0–34.0)
MCHC: 34.1 g/dL (ref 30.0–36.0)
MCV: 93.4 fL (ref 78.0–100.0)
MONO ABS: 0.5 10*3/uL (ref 0.1–1.0)
Monocytes Relative: 8 %
Neutro Abs: 2.7 10*3/uL (ref 1.7–7.7)
Neutrophils Relative %: 44 %
PLATELETS: 188 10*3/uL (ref 150–400)
RBC: 4.84 MIL/uL (ref 3.87–5.11)
RDW: 12.8 % (ref 11.5–15.5)
WBC: 6.2 10*3/uL (ref 4.0–10.5)

## 2017-06-26 LAB — HIV ANTIBODY (ROUTINE TESTING W REFLEX): HIV SCREEN 4TH GENERATION: NONREACTIVE

## 2017-06-26 LAB — GLUCOSE, CAPILLARY
GLUCOSE-CAPILLARY: 273 mg/dL — AB (ref 65–99)
GLUCOSE-CAPILLARY: 233 mg/dL — AB (ref 65–99)
GLUCOSE-CAPILLARY: 96 mg/dL (ref 65–99)
Glucose-Capillary: 114 mg/dL — ABNORMAL HIGH (ref 65–99)

## 2017-06-26 LAB — HEMOGLOBIN A1C
HEMOGLOBIN A1C: 11.1 % — AB (ref 4.8–5.6)
MEAN PLASMA GLUCOSE: 272 mg/dL

## 2017-06-26 MED ORDER — ATORVASTATIN CALCIUM 40 MG PO TABS: 40.0000 mg | ORAL_TABLET | Freq: Every day | ORAL | 5 refills | Status: DC

## 2017-06-26 MED ORDER — AMLODIPINE BESYLATE 10 MG PO TABS: 10.0000 mg | ORAL_TABLET | Freq: Every day | ORAL | 5 refills | Status: DC

## 2017-06-26 MED ORDER — ASPIRIN 325 MG PO TABS: 325.0000 mg | ORAL_TABLET | Freq: Every day | ORAL | 5 refills | Status: DC

## 2017-06-26 MED ORDER — BLOOD GLUCOSE MONITOR KIT: PACK | 0 refills | Status: DC

## 2017-06-26 MED ORDER — GLIMEPIRIDE 2 MG PO TABS: 2.0000 mg | ORAL_TABLET | ORAL | 5 refills | Status: DC

## 2017-06-26 MED ORDER — INSULIN ASPART 100 UNIT/ML ~~LOC~~ SOLN: 0.0000 [IU] | Freq: Every day | SUBCUTANEOUS | Status: DC

## 2017-06-26 MED ORDER — INSULIN GLARGINE 100 UNIT/ML ~~LOC~~ SOLN
5.0000 [IU] | Freq: Every day | SUBCUTANEOUS | Status: DC
  Administered 2017-06-26 – 2017-06-27 (×2): 5 [IU] via SUBCUTANEOUS
  Filled 2017-06-26 (×2): qty 0.05

## 2017-06-26 MED ORDER — LIVING WELL WITH DIABETES BOOK
Freq: Once | Status: AC
  Administered 2017-06-26: 10:00:00
  Filled 2017-06-26: qty 1

## 2017-06-26 MED ORDER — INSULIN ASPART 100 UNIT/ML ~~LOC~~ SOLN
0.0000 [IU] | Freq: Three times a day (TID) | SUBCUTANEOUS | Status: DC
  Administered 2017-06-26: 3 [IU] via SUBCUTANEOUS
  Administered 2017-06-26: 5 [IU] via SUBCUTANEOUS
  Administered 2017-06-27: 1 [IU] via SUBCUTANEOUS

## 2017-06-26 MED ORDER — HYDRALAZINE HCL 10 MG PO TABS: 10.0000 mg | ORAL_TABLET | Freq: Three times a day (TID) | ORAL | 5 refills | Status: DC

## 2017-06-26 MED ORDER — METFORMIN HCL 1000 MG PO TABS: 1000.0000 mg | ORAL_TABLET | Freq: Two times a day (BID) | ORAL | 5 refills | Status: DC

## 2017-06-26 NOTE — Progress Notes (Signed)
    [facility] has been requested to perform a transesophageal echocardiogram on Tammy Bauer for stroke.  After careful review of history and examination, the risks and benefits of transesophageal echocardiogram have been explained including risks of esophageal damage, perforation (1:10,000 risk), bleeding, pharyngeal hematoma as well as other potential complications associated with conscious sedation including aspiration, arrhythmia, respiratory failure and death. Alternatives to treatment were discussed, questions were answered. Patient is willing to proceed. .  Pt is scheduled for TEE tomorrow at 3pm with Dr. Rennis Bauer. NPO at MN please.  Tammy Bauer, [place]PA  06/26/2017 5:14 PM

## 2017-06-26 NOTE — Progress Notes (Signed)
Occupational Therapy Treatment Patient Details Name: Tammy GaskinsMela Bauer MRN: [id number] DOB: 1966/09/19 Today's Date: 06/26/2017    History of present illness Pt is a 51 y.o. female who presented to the ED with complaint of double vision. She was found to be hypertensive in the ED. She has a PMH significant for severe HTN, non-compliance with meds due to lack of health coverage, and history of tobacco abuse. CT revealed hypodensity within L parasaggital occipital lobe. MRI revealed acute/subacute nonhemorrhagic infarct involving the superior L temporal lobe, posterior insular cortex, and L frontal operculum.   OT comments  Pt demonstrating progress toward OT goals. She continues to demonstrate decreased executive functioning and short-term memory impacting independence with financial and medication management tasks. Additionally noted difficulty with abstract reasoning this session. Educated pt and husband concerning compensatory strategies including use of pill organizer, list-making, and writing out mathematical calculations during financial tasks. Additionally recommended that pt have supervision for IADL tasks in order to maximize safety and pt/husband are in agreement. OT will continue to follow while admitted.    Follow Up Recommendations  No OT follow up;Supervision - Intermittent    Equipment Recommendations  None recommended by OT    Recommendations for Other Services      Precautions / Restrictions Precautions Precautions: None Restrictions Weight Bearing Restrictions: No       Mobility Bed Mobility Overal bed mobility: Independent                Transfers Overall transfer level: Independent                    Balance Overall balance assessment: No apparent balance deficits (not formally assessed)                                         ADL either performed or assessed with clinical judgement   ADL Overall ADL's : Needs  assistance/impaired                                       General ADL Comments: See cognition section for details of education provided. Pt requiring supervision for safety with financial and medication management tasks.      Vision   Additional Comments: Pt reports double vision is improving but is not fully resolved. She is able to functionally use vision this session. Recommend that pt see opthamologist for full visual field and acuity testing.    Perception     Praxis      Cognition Arousal/Alertness: Awake/alert Behavior During Therapy: WFL for tasks assessed/performed Overall Cognitive Status: Impaired/Different from baseline Area of Impairment: Safety/judgement;Awareness;Problem solving;Attention                   Current Attention Level: Selective     Safety/Judgement: Decreased awareness of safety Awareness: Emergent Problem Solving: Slow processing General Comments: Difficulty with visual construction tasks. Pt with decreased executive functioning, abstract reasoning, and short-term memory skills. Educated pt and husband concerning use of pill organizer and checklists to improve safety with medication management. Additionally educated pt and husband on need for supervision with financial management tasks.         Exercises     Shoulder Instructions       General Comments      Pertinent Vitals/ Pain  Pain Assessment: No/denies pain  Home Living                                          Prior Functioning/Environment              Frequency  Min 2X/week        Progress Toward Goals  OT Goals(current goals can now be found in the care plan section)  Progress towards OT goals: Progressing toward goals  Acute Rehab OT Goals Patient Stated Goal: to go home OT Goal Formulation: With patient Time For Goal Achievement: 07/02/17 Potential to Achieve Goals: Good ADL Goals Additional ADL Goal #1: Pt will  independently incorporate 2 strategies to compensate for decreased functional use of vision during standing grooming tasks.  Additional ADL Goal #2: Pt will demonstrate improved executive functioning skills to complete medication management tasks.   Plan Discharge plan remains appropriate    Co-evaluation                 AM-PAC PT "6 Clicks" Daily Activity     Outcome Measure   Help from another person eating meals?: None Help from another person taking care of personal grooming?: None Help from another person toileting, which includes using toliet, bedpan, or urinal?: None Help from another person bathing (including washing, rinsing, drying)?: None Help from another person to put on and taking off regular upper body clothing?: None Help from another person to put on and taking off regular lower body clothing?: None 6 Click Score: 24    End of Session Equipment Utilized During Treatment: Gait belt  OT Visit Diagnosis: Low vision, both eyes (H54.2);Other symptoms and signs involving cognitive function   Activity Tolerance Patient tolerated treatment well   Patient Left in bed;with call bell/phone within reach;with family/visitor present   Nurse Communication Mobility status        Time: 1610-9604 OT Time Calculation (min): 22 min  Charges: OT General Charges $OT Visit: 1 Procedure OT Treatments $Therapeutic Activity: 8-22 mins  Doristine Section, MS OTR/L  Pager: [phone]    Ethyn Schetter A Adonnis Salceda 06/26/2017, 6:34 PM

## 2017-06-26 NOTE — Progress Notes (Signed)
[facility]                                                                              Patient Demographics  Tammy Bauer, is a 51 y.o. female, DOB - Aug 23, 1966, ZOX:[id number]  Admit date - 06/24/2017   Admitting Physician Mauricio Annett Gula, MD  Outpatient Primary MD for the patient is Johna Sheriff, MD  Outpatient specialists:   LOS - 1  days   Medical records reviewed and are as summarized below:    Chief Complaint  Patient presents with  . Otalgia  . Eye Problem       Brief summary   For complete details please refer to admission H and P, but in brief 51 year old female who presented with blurry vision and elevated blood pressure. Patient is known to have hypertension, medical noncompliance, tobacco abuse. Presented with headache for the last 48 hours, associated with blurry vision, today with double vision. Started on antihypertensive agent about 7 days ago prior to admission. CT head showed mild focal hypodensity in the left parasagittal occipital lobe cannot exclude edema or infarct. MRI of the brain showed acute/subacute nonhemorrhagic infarct involving the superior left temporal lobe, posterior insular cortex and left frontal operculum. Patient was transferred to [facility] for further workup.    Assessment & Plan    Acute CVA: Risk factors include uncontrolled hypertension, hyperlipidemia, uncontrolled diabetes mellitus -MRI showed acute to subacute ischemic infarct at the left superior temporal lobe posterior insular cortex and left frontal operculum -MRA brain showed focal moderate to now since involving the superior left M2 division without focal occlusion Mild to distal branch vessel disease within the posterior cerebral arteries bilaterally -2-D echo showed EF of 55-60%, no regional wall motion abnormalities - Carotid Dopplers showed minimal to moderate amount of bilateral atherosclerotic plaque right greater than left no significant  stenosis within either internal carotid artery - The patient was placed on aspirin 325 mg daily - Per neurology, TEE planned in  A.m. - LDL 112, placed on statin - PT OT, ST evaluation  Hypertensive emergency - Currently permissive hypertension for the initial 24-48 hours - Patient reports she was recently started on Norvasc, will add hydralazine at discharge, once cheaper medication from [facility] - She is allergic to ace inhibitors.   Hyperlipidemia - LDL 112, placed on statin   Diabetes mellitus, type II, uncontrolled - Uncontrolled, patient reports that she did not know about diabetes however I reviewed her prior office notes and she was seen by her PCP in 10/2015, noted to be on metformin 500 mg twice a day at that time including losartan. Hemoglobin A1c was 7.7 in November 2016. - discussed about diet, close blood glucose monitoring, diabetic coordinator consult placed - Hemoglobin A1c 11.1  - I recommended metformin 1000 mg twice a day with Amaryl 2 mg with breakfast if she wants to continue oral hypoglycemics. Will need glucometer at discharge - While inpatient placed on Modified diet, sliding scale insulin, Lantus 5 units daily   Code Status: full  DVT Prophylaxis:  SCD's Family Communication: Discussed in detail with the patient, all imaging results, lab results explained  to the patientand patient's husband at the bedside  Disposition Plan: Likely in a.m.  Time Spent in minutes   25 minutes  Procedures:  MRI, MRA, 2-D echo  Consultants:   Neurology  Antimicrobials:      Medications  Scheduled Meds: .  stroke: mapping our early stages of recovery book   Does not apply Once  . aspirin  300 mg Rectal Daily   Or  . aspirin  325 mg Oral Daily  . atorvastatin  40 mg Oral q1800  . heparin  5,000 Units Subcutaneous Q8H  . insulin aspart  0-5 Units Subcutaneous QHS  . insulin aspart  0-9 Units Subcutaneous TID WC  . insulin glargine  5 Units Subcutaneous Daily    Continuous Infusions: . sodium chloride Stopped (06/25/17 1900)   PRN Meds:.acetaminophen **OR** acetaminophen (TYLENOL) oral liquid 160 mg/5 mL **OR** acetaminophen, labetalol, senna-docusate   Antibiotics   Anti-infectives    None        Subjective:   Tammy Bauer was seen and examined today.Denies any specific complaints, feeling better, visual blurring improving. Patient denies dizziness, chest pain, shortness of breath, abdominal pain, N/V/D/C, new weakness, numbess, tingling. No acute events overnight.    Objective:   Vitals:   06/26/17 0110 06/26/17 0550 06/26/17 0941 06/26/17 1347  BP: (!) 174/87 (!) 154/86 (!) 183/95 (!) 158/73  Pulse: 83 76 75 70  Resp: 16 16 20 20   Temp: 98.1 F (36.7 C)  98.2 F (36.8 C) 97.8 F (36.6 C)  TempSrc: Oral  Oral Oral  SpO2: 98% 98% 95% 99%  Weight:      Height:        Intake/Output Summary (Last 24 hours) at 06/26/17 1415 Last data filed at 06/26/17 1348  Gross per 24 hour  Intake              720 ml  Output                0 ml  Net              720 ml     Wt Readings from Last 3 Encounters:  06/24/17 65.8 kg (145 lb)  06/24/17 65.8 kg (145 lb)  06/14/17 65.8 kg (145 lb)     Exam  General: Alert and oriented x 3, NAD  Eyes: PERRLA, EOMI, Anicteric Sclera,  HEENT:  Atraumatic, normocephalic, normal oropharynx  Cardiovascular: S1 S2 auscultated, no rubs, murmurs or gallops. Regular rate and rhythm.  Respiratory: Clear to auscultation bilaterally, no wheezing, rales or rhonchi  Gastrointestinal: Soft, nontender, nondistended, + bowel sounds  Ext: no pedal edema bilaterally  Neuro: AAOx3, Cr N's II- XII. Strength 5/5 upper and lower extremities bilaterally, speech clear, sensations grossly intact  Musculoskeletal: No digital cyanosis, clubbing  Skin: No rashes  Psych: Normal affect and demeanor, alert and oriented x3    Data Reviewed:  I have personally reviewed following labs and imaging  studies  Micro Results No results found for this or any previous visit (from the past 240 hour(s)).  Radiology Reports Ct Head Wo Contrast  Result Date: 06/24/2017 CLINICAL DATA:  Hypertension with double vision EXAM: CT HEAD WITHOUT CONTRAST TECHNIQUE: Contiguous axial images were obtained from the base of the skull through the vertex without intravenous contrast. COMPARISON:  05/22/2015 FINDINGS: Brain: No hemorrhage or intracranial mass is seen. There is hypodensity within the left temporal lobe an left insula/ sub insula felt consistent with remote infarct. Small lacunar infarct within the  left parasagittal frontal lobe. No midline shift. The ventricles are nonenlarged. Possible subtle hypodensity within the left occipital lobe. Vascular: No hyperdense vessels.  Carotid artery calcifications. Skull: No fracture or suspicious bone lesion. Sinuses/Orbits: Minimal mucosal thickening in the ethmoid sinuses. No acute orbital abnormality. Other: None IMPRESSION: 1. Negative for acute intracranial hemorrhage 2. 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 3. Mild focal hypodensity within the left parasagittal occipital lobe, cannot exclude small area of edema/acute infarct, suggest MRI for further evaluation. Electronically Signed   By: Jasmine PangKim  Fujinaga M.D.   On: 06/24/2017 21:25   Mr Brain Wo Contrast  Result Date: 06/25/2017 CLINICAL DATA:  Acute CVA.  Diplopia.  Left ear pain. EXAM: MRI HEAD WITHOUT CONTRAST MRA HEAD WITHOUT CONTRAST TECHNIQUE: Multiplanar, multiecho pulse sequences of the brain and surrounding structures were obtained without intravenous contrast. Angiographic images of the head were obtained using MRA technique without contrast. COMPARISON:  CT head without contrast 06/24/2017 FINDINGS: MRI HEAD FINDINGS Brain: Diffusion-weighted images confirm an acute/subacute nonhemorrhagic infarct involving the posterior insular ribbon, left superior  temporal gyrus, and left frontal operculum. There is no associated hemorrhage. Cortical T2 changes are associated. Other scattered subcortical T2 hyperintensities are present bilaterally. There is a remote lacunar infarct involving the left corona radiata. The brainstem and cerebellum are normal. Vascular: Flow is present in the major intracranial arteries. Skull and upper cervical spine: The skullbase is within normal limits. The craniocervical junction is normal. Midline sagittal structures are unremarkable. Sinuses/Orbits: The globes and orbits are within normal limits. The paranasal sinuses and mastoid air cells are clear. MRA HEAD FINDINGS The internal carotid artery is are within normal limits from the high cervical segments through the ICA termini bilaterally. The A1 and M1 segments are normal. No definite anterior communicating artery is present. The right MCA bifurcation is intact. There is moderate attenuation involving the proximal superior M2 division. The inferior posterior left into deviation is unremarkable. The vertebral arteries are codominant. PICA origins are visualized and normal. The vertebrobasilar junction is normal. Both posterior cerebral arteries originate from the basilar tip. There is some attenuation distal PCA branch vessels. IMPRESSION: 1. Acute/subacute nonhemorrhagic infarct involving the superior left temporal lobe, posterior insular cortex, and left frontal operculum. 2. Other white matter disease and a remote lacunar infarct involving the left corona radiata suggests microvascular disease. 3. Focal moderate stenosis involving the superior left M2 division without focal occlusion. 4. Mild distal branch vessel disease within the posterior cerebral arteries bilaterally. Electronically Signed   By: Marin Robertshristopher  Mattern M.D.   On: 06/25/2017 08:21   [place] Carotid Bilateral (at [facility] And [facility] Only)  Result Date: 06/25/2017 CLINICAL DATA:  Acute CVA. Visual disturbance. Syncopal episode.  History of hypertension, hyperlipidemia, diabetes and smoking. EXAM: BILATERAL CAROTID DUPLEX ULTRASOUND TECHNIQUE: Wallace CullensGray scale imaging, color Doppler and duplex ultrasound were performed of bilateral carotid and vertebral arteries in the neck. COMPARISON:  None. FINDINGS: Criteria: Quantification of carotid stenosis is based on velocity parameters that correlate the residual internal carotid diameter with NASCET-based stenosis levels, using the diameter of the distal internal carotid lumen as the denominator for stenosis measurement. The following velocity measurements were obtained: RIGHT ICA:  72/31 cm/sec CCA:  64/22 cm/sec SYSTOLIC ICA/CCA RATIO:  1.1 DIASTOLIC ICA/CCA RATIO:  1.4 ECA:  92 cm/sec LEFT ICA:  51/17 cm/sec CCA:  71/21 cm/sec SYSTOLIC ICA/CCA RATIO:  0.7 DIASTOLIC ICA/CCA RATIO:  0.8 ECA:  68 cm/sec RIGHT CAROTID ARTERY: There is  a minimal amount of eccentric mixed echogenic plaque seen scattered throughout the right common carotid artery. There is a moderate amount of eccentric mixed echogenic plaque within the right carotid bulb (image 17), extending to involve the origin and proximal aspects of the right internal carotid artery (image 25), not definitely resulting in elevated peak systolic velocities within the interrogated course the right internal carotid artery to suggest a hemodynamically significant stenosis. Elevated peak systolic velocity within the distal aspect the right internal carotid artery is felt to be factitiously elevated due to sampling at a location of turbulent flow and tortuosity. RIGHT VERTEBRAL ARTERY:  Antegrade Flow LEFT CAROTID ARTERY: There is a minimal amount of intimal thickening throughout the left common carotid artery. There is a minimal amount of intimal thickening and hypoechoic plaque within the left carotid bulb (image 52), extending to involve the origin and proximal aspects of the left internal carotid artery (image 60), not resulting in elevated peak  systolic velocities within the interrogated course of the left internal carotid artery to suggest a hemodynamically significant stenosis. LEFT VERTEBRAL ARTERY:  Antegrade flow IMPRESSION: Minimal to moderate amount of bilateral atherosclerotic plaque, right greater than left, not resulting in a hemodynamically significant stenosis within either internal carotid artery. Electronically Signed   By: Simonne Come M.D.   On: 06/25/2017 08:45   Mr Maxine Glenn Head/brain ZO Cm  Result Date: 06/25/2017 CLINICAL DATA:  Acute CVA.  Diplopia.  Left ear pain. EXAM: MRI HEAD WITHOUT CONTRAST MRA HEAD WITHOUT CONTRAST TECHNIQUE: Multiplanar, multiecho pulse sequences of the brain and surrounding structures were obtained without intravenous contrast. Angiographic images of the head were obtained using MRA technique without contrast. COMPARISON:  CT head without contrast 06/24/2017 FINDINGS: MRI HEAD FINDINGS Brain: Diffusion-weighted images confirm an acute/subacute nonhemorrhagic infarct involving the posterior insular ribbon, left superior temporal gyrus, and left frontal operculum. There is no associated hemorrhage. Cortical T2 changes are associated. Other scattered subcortical T2 hyperintensities are present bilaterally. There is a remote lacunar infarct involving the left corona radiata. The brainstem and cerebellum are normal. Vascular: Flow is present in the major intracranial arteries. Skull and upper cervical spine: The skullbase is within normal limits. The craniocervical junction is normal. Midline sagittal structures are unremarkable. Sinuses/Orbits: The globes and orbits are within normal limits. The paranasal sinuses and mastoid air cells are clear. MRA HEAD FINDINGS The internal carotid artery is are within normal limits from the high cervical segments through the ICA termini bilaterally. The A1 and M1 segments are normal. No definite anterior communicating artery is present. The right MCA bifurcation is intact. There  is moderate attenuation involving the proximal superior M2 division. The inferior posterior left into deviation is unremarkable. The vertebral arteries are codominant. PICA origins are visualized and normal. The vertebrobasilar junction is normal. Both posterior cerebral arteries originate from the basilar tip. There is some attenuation distal PCA branch vessels. IMPRESSION: 1. Acute/subacute nonhemorrhagic infarct involving the superior left temporal lobe, posterior insular cortex, and left frontal operculum. 2. Other white matter disease and a remote lacunar infarct involving the left corona radiata suggests microvascular disease. 3. Focal moderate stenosis involving the superior left M2 division without focal occlusion. 4. Mild distal branch vessel disease within the posterior cerebral arteries bilaterally. Electronically Signed   By: Marin Roberts M.D.   On: 06/25/2017 08:21    Lab Data:  CBC:  Recent Labs Lab 06/24/17 1958 06/26/17 0149  WBC 7.5 6.2  NEUTROABS  --  2.7  HGB 16.2* 15.4*  HCT 46.8* 45.2  MCV 94.4 93.4  PLT 171 188   Basic Metabolic Panel:  Recent Labs Lab 06/24/17 1958 06/26/17 0149  NA 141 134*  K 3.4* 3.5  CL 105 102  CO2 27 24  GLUCOSE 284* 205*  BUN 12 9  CREATININE 0.57 0.65  CALCIUM 10.1 9.5   GFR: Estimated Creatinine Clearance: 75.7 mL/min (by C-G formula based on SCr of 0.65 mg/dL). Liver Function Tests: No results for input(s): AST, ALT, ALKPHOS, BILITOT, PROT, ALBUMIN in the last 168 hours. No results for input(s): LIPASE, AMYLASE in the last 168 hours. No results for input(s): AMMONIA in the last 168 hours. Coagulation Profile: No results for input(s): INR, PROTIME in the last 168 hours. Cardiac Enzymes: No results for input(s): CKTOTAL, CKMB, CKMBINDEX, TROPONINI in the last 168 hours. BNP (last 3 results) No results for input(s): PROBNP in the last 8760 hours. HbA1C:  Recent Labs  06/25/17 0531  HGBA1C 11.1*   CBG:  Recent  Labs Lab 06/26/17 0829 06/26/17 1134  GLUCAP 273* 233*   Lipid Profile:  Recent Labs  06/25/17 0531  CHOL 184  HDL 31*  LDLCALC 112*  TRIG 204*  CHOLHDL 5.9   Thyroid Function Tests: No results for input(s): TSH, T4TOTAL, FREET4, T3FREE, THYROIDAB in the last 72 hours. Anemia Panel: No results for input(s): VITAMINB12, FOLATE, FERRITIN, TIBC, IRON, RETICCTPCT in the last 72 hours. Urine analysis:    Component Value Date/Time   COLORURINE YELLOW 04/02/2016 0638   APPEARANCEUR CLEAR 04/02/2016 0638   LABSPEC 1.015 04/02/2016 0638   PHURINE 6.0 04/02/2016 0638   GLUCOSEU NEGATIVE 04/02/2016 0638   HGBUR NEGATIVE 04/02/2016 0638   BILIRUBINUR NEGATIVE 04/02/2016 0638   KETONESUR 15 (A) 04/02/2016 0638   PROTEINUR NEGATIVE 04/02/2016 0638   UROBILINOGEN 1.0 05/22/2015 2045   NITRITE NEGATIVE 04/02/2016 0638   LEUKOCYTESUR SMALL (A) 04/02/2016 6962     Tammy Bauer M.D. [facility] 06/26/2017, 2:15 PM  Pager: [phone] Between 7am to 7pm - call Pager - [phone]  After 7pm go to [email or web address] - password [email or web address]  Call night coverage person covering after 7pm

## 2017-06-26 NOTE — Care Management Note (Signed)
Case Management Note  Patient Details  Name: Tammy Bauer MRN: [id number] Date of Birth: Aug 27, 1966  Subjective/Objective:   Pt admitted with CVA. She is from home with her spouse. Pt without insurance. She has been active with [employer] in the past.                  Action/Plan: CM met with the patient to go over PCP and medication assistance after d/c. Pt not sure where she would like to go for PCP, either stay in [place] or travel up to [place]. CM provided her with information on clinics in [facility] and gave her information on the [facility] and the [facility] with use of the [facility] pharmacy. CM encouraged her to make an appointment ASAP after d/c with one of the clinics so she would no have a lapse in her medication or health care. Pt verbalized understanding.  CM will follow patient and provide [place] letter at d/c to assist with the cost of her d/c medications.   Expected Discharge Date:                  Expected Discharge Plan:  Home/Self Care  In-House Referral:     Discharge planning Services  CM Consult, Medication Assistance  Post Acute Care Choice:    Choice offered to:     DME Arranged:    DME Agency:     HH Arranged:    HH Agency:     Status of Service:  In process, will continue to follow  If discussed at Long Length of Stay Meetings, dates discussed:    Additional Comments:  Pollie Friar, RN 06/26/2017, 1:44 PM

## 2017-06-26 NOTE — Progress Notes (Signed)
Inpatient Diabetes Program Recommendations  AACE/ADA: New Consensus Statement on Inpatient Glycemic Control (2015)  Target Ranges:  Prepandial:   less than 140 mg/dL      Peak postprandial:   less than 180 mg/dL (1-2 hours)      Critically ill patients:  140 - 180 mg/dL   Spoke with patient and husband about new diabetes diagnosis.  Discussed A1C results (11.1%) and explained what an A1C is. Discussed basic pathophysiology of DM Type 2, basic home care, importance of checking CBGs and maintaining good CBG control to prevent long-term and short-term complications. Reviewed A1C goals. Discussed impact of nutrition, exercise, stress, sickness, and medications on diabetes control. Reviewed Living Well with diabetes booklet and encouraged patient to read through entire book. Patient and husband verbalized understanding of information. RNs to provide ongoing basic DM education at bedside with this patient and engage patient to actively check blood glucose and administer insulin injections.   Thanks, Christena DeemShannon Sarafina Puthoff RN, MSN, [facility] Inpatient Diabetes Coordinator Team Pager [phone] (8a-5p)

## 2017-06-26 NOTE — Evaluation (Signed)
Physical Therapy Evaluation Patient Details Name: Tammy Bauer MRN: [id number] DOB: 27-Feb-1966 Today's Date: 06/26/2017   History of Present Illness  Pt is a 51 y.o. female who presented to the ED with complaint of double vision. She was found to be hypertensive in the ED. She has a PMH significant for severe HTN, non-compliance with meds due to lack of health coverage, and history of tobacco abuse. CT revealed hypodensity within L parasaggital occipital lobe. MRI revealed acute/subacute nonhemorrhagic infarct involving the superior L temporal lobe, posterior insular cortex, and L frontal operculum.  Clinical Impression  Patient seen for assessment, independent with all activity. No further acute PT needs.    Follow Up Recommendations No PT follow up    Equipment Recommendations  None recommended by PT    Recommendations for Other Services       Precautions / Restrictions Precautions Precautions: None Restrictions Weight Bearing Restrictions: No      Mobility  Bed Mobility Overal bed mobility: Independent                Transfers Overall transfer level: Independent                  Ambulation/Gait Ambulation/Gait assistance: Independent Ambulation Distance (Feet): 510 Feet Assistive device: None Gait Pattern/deviations: WFL(Within Functional Limits)        Stairs            Wheelchair Mobility    Modified Rankin (Stroke Patients Only) Modified Rankin (Stroke Patients Only) Pre-Morbid Rankin Score: No symptoms Modified Rankin: No symptoms     Balance Overall balance assessment: No apparent balance deficits (not formally assessed)                               Standardized Balance Assessment Standardized Balance Assessment : Dynamic Gait Index   Dynamic Gait Index Level Surface: Normal Change in Gait Speed: Normal Gait with Horizontal Head Turns: Normal Gait with Vertical Head Turns: Normal Gait and Pivot Turn:  Normal Step Over Obstacle: Mild Impairment Step Around Obstacles: Normal Steps: Mild Impairment Total Score: 22       Pertinent Vitals/Pain      Home Living Family/patient expects to be discharged to:: Private residence Living Arrangements: Spouse/significant other Available Help at Discharge: Family;Available 24 hours/day Type of Home: House Home Access: Stairs to enter Entrance Stairs-Rails: Left Entrance Stairs-Number of Steps: 2 Home Layout: One level Home Equipment: None      Prior Function Level of Independence: Independent               Hand Dominance   Dominant Hand: Right    Extremity/Trunk Assessment   Upper Extremity Assessment Upper Extremity Assessment: Overall WFL for tasks assessed    Lower Extremity Assessment Lower Extremity Assessment: Overall WFL for tasks assessed       Communication   Communication: No difficulties  Cognition Arousal/Alertness: Awake/alert Behavior During Therapy: WFL for tasks assessed/performed Overall Cognitive Status: Impaired/Different from baseline Area of Impairment: Safety/judgement;Awareness;Problem solving;Attention                   Current Attention Level: Selective     Safety/Judgement: Decreased awareness of safety Awareness: Emergent Problem Solving: Slow processing General Comments: When completing cognitive tasks in hallway, pt needing to stop ambulating to process through task. Pt demonstrating decreased higher level cognitive skills specifically in the areas of executive functioning demonstrated by difficulty with pathfinding tasks in hallway requiring  VC's to complete. Additionally noted difficulty with problem solving skills in moderately distracting environment.       General Comments      Exercises     Assessment/Plan    PT Assessment Patent does not need any further PT services  PT Problem List         PT Treatment Interventions      PT Goals (Current goals can be found  in the Care Plan section)  Acute Rehab PT Goals Patient Stated Goal: to go home PT Goal Formulation: All assessment and education complete, DC therapy    Frequency     Barriers to discharge        Co-evaluation               AM-PAC PT "6 Clicks" Daily Activity  Outcome Measure Difficulty turning over in bed (including adjusting bedclothes, sheets and blankets)?: None Difficulty moving from lying on back to sitting on the side of the bed? : None Difficulty sitting down on and standing up from a chair with arms (e.g., wheelchair, bedside commode, etc,.)?: None Help needed moving to and from a bed to chair (including a wheelchair)?: None Help needed walking in hospital room?: None Help needed climbing 3-5 steps with a railing? : None 6 Click Score: 24    End of Session   Activity Tolerance: Patient tolerated treatment well     PT Visit Diagnosis: Other symptoms and signs involving the nervous system (R29.898)    Time: [id number] PT Time Calculation (min) (ACUTE ONLY): 16 min   Charges:   PT Evaluation $PT Eval Low Complexity: 1 Procedure     PT G Codes:   PT G-Codes **NOT FOR INPATIENT CLASS** Functional Assessment Tool Used: Clinical judgement Functional Limitation: Mobility: Walking and moving around Mobility: Walking and Moving Around Current Status (V4098(G8978): 0 percent impaired, limited or restricted Mobility: Walking and Moving Around Goal Status (J1914(G8979): 0 percent impaired, limited or restricted Mobility: Walking and Moving Around Discharge Status (N8295(G8980): 0 percent impaired, limited or restricted    Charlotte Crumbevon Angelito Hopping, PT DPT  Board Certified Neurologic Specialist [phone]   Fabio AsaDevon J Matayah Reyburn 06/26/2017, 9:56 AM

## 2017-06-26 NOTE — Progress Notes (Signed)
Inpatient Diabetes Program Recommendations  AACE/ADA: New Consensus Statement on Inpatient Glycemic Control (2015)  Target Ranges:  Prepandial:   less than 140 mg/dL      Peak postprandial:   less than 180 mg/dL (1-2 hours)      Critically ill patients:  140 - 180 mg/dL   Spoke with patient and husband again. Stressed to the patient the importance of improving glycemic control to prevent further complications from uncontrolled diabetes.  Discussed in detail carbohydrates, carbohydrate goals per day and meal, along with portion sizes. Discussed snack options and eating out. Discussed meal planning with patient and husband. Patient's husband is a truck driver that is trying to watch his glucose levels as well.    Thanks,  Christena DeemShannon Kiyanna Biegler RN, MSN, [facility] Inpatient Diabetes Coordinator Team Pager [phone] (8a-5p)

## 2017-06-26 NOTE — Progress Notes (Signed)
STROKE TEAM PROGRESS NOTE   HISTORY OF PRESENT ILLNESS (per record) Tammy GaskinsMela Bauer is a 51 y.o. female with a history of hypertension, medication non-compliance, and tobacco abuse who presented to the emergency room at [facility] with complaints of double vision that started about a week ago but can't really worse on Sunday. Next line should reported seeing double from both eyes, images side-to-side with no improvement if she covered one eye.  Her blood pressure on arrival systolic was 220s. She was admitted for evaluation of possible posterior reversible encephalopathy syndrome versus stroke. MRI imaging was obtained which revealed evidence of stroke.  [facility] because of un[facility] beds at [facility].  Denies any current headaches. Reports occasional headaches. Next line denies any nausea or vomiting. Next line to managing fevers chills next line denies any weight gain or weight loss.  Patient does not report any easy bruisability or bleeding. Denies any clotting disorders. Denies any history of miscarriages.  Pt is a current smoker and newly-diagnosed diabetic with an a1C of 11.1.  LKW: At least 7 days ago Premorbid modified Rankin scale (mRS): 0 NIHSS: 0-Completely asymptomatic and back to baseline post-stroke   Patient was not administered IV t-PA secondary to arriving outside of the treatment window. She was admitted to General Neurology for further evaluation and treatment.   SUBJECTIVE (INTERVAL HISTORY) Her husband is at the bedside.  The patient is awake, alert, and oriented.  She reports a left temporal headache preceding her onset of symptoms.  The patient earlier reported diplopia, but after additional questions, characterizes her visual change as monocular  "shadow" on objects that occurs independently in each eye while the other is covered during examination.   OBJECTIVE Temp:  [97.8 F (36.6 C)-98.5 F (36.9 C)] 98.1 F (36.7 C)  (07/24 0110) Pulse Rate:  [70-85] 76 (07/24 0550) Cardiac Rhythm: Normal sinus rhythm (07/24 0731) Resp:  [16-24] 16 (07/24 0550) BP: (154-189)/(83-99) 154/86 (07/24 0550) SpO2:  [98 %-100 %] 98 % (07/24 0550)  CBC:  Recent Labs Lab 06/24/17 1958 06/26/17 0149  WBC 7.5 6.2  NEUTROABS  --  2.7  HGB 16.2* 15.4*  HCT 46.8* 45.2  MCV 94.4 93.4  PLT 171 188    Basic Metabolic Panel:  Recent Labs Lab 06/24/17 1958 06/26/17 0149  NA 141 134*  K 3.4* 3.5  CL 105 102  CO2 27 24  GLUCOSE 284* 205*  BUN 12 9  CREATININE 0.57 0.65  CALCIUM 10.1 9.5    Lipid Panel:    Component Value Date/Time   CHOL 184 06/25/2017 0531   TRIG 204 (H) 06/25/2017 0531   HDL 31 (L) 06/25/2017 0531   CHOLHDL 5.9 06/25/2017 0531   VLDL 41 (H) 06/25/2017 0531   LDLCALC 112 (H) 06/25/2017 0531   HgbA1c:  Lab Results  Component Value Date   HGBA1C 11.1 (H) 06/25/2017   Urine Drug Screen: No results found for: LABOPIA, COCAINSCRNUR, LABBENZ, AMPHETMU, THCU, LABBARB  Alcohol Level No results found for: ETH  IMAGING  Ct Head Wo Contrast 06/24/2017 IMPRESSION: 1. Negative for acute intracranial hemorrhage 2. 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 3. Mild focal hypodensity within the left parasagittal occipital lobe, cannot exclude small area of edema/acute infarct, suggest MRI for further evaluation.    Mr Brain 39Wo Contrast Mr Maxine GlennMra Head/brain Wo Cm 06/25/2017 IMPRESSION: 1. Acute/subacute nonhemorrhagic infarct involving the superior left temporal lobe, posterior insular cortex,  and left frontal operculum. 2. Other white matter disease and a remote lacunar infarct involving the left corona radiata suggests microvascular disease. 3. Focal moderate stenosis involving the superior left M2 division without focal occlusion. 4. Mild distal branch vessel disease within the posterior cerebral arteries bilaterally.  US Carotid Bilateral (at [facility]  Only) 06/25/2017 IMPRESSION: Minimal to moderate amount of bilateral atherosclerotic plaque, right greater than left, not resulting in a hemodynamically significant stenosis within either internal carotid artery.  2D Echo 06/25/2017 Study Conclusions - Left ventricle: The cavity size was normal. Wall thickness was   increased in a pattern of mild LVH. Systolic function was normal.  The estimated ejection fraction was in the range of 55% to 60%.  Wall motion was normal; there were no regional wall motion abnormalities. - Aortic valve: Valve area (VTI): 2.83 cm^2. Valve area (Vmax): 2.58 cm^2. Valve area (Vmean): 2.46 cm^2. - Technically adequate study.    PHYSICAL EXAM Frail middle aged african american lady not in distress. . Afebrile. Head is nontraumatic. Neck is supple without bruit.    Cardiac exam no murmur or gallop. Lungs are clear to auscultation. Distal pulses are well felt. Neurological Exam ;  Awake  Alert oriented x 3. Normal speech and language.eye movements full without nystagmus.fundi were not visualized. Vision acuity and fields appear normal. Hearing is normal. Palatal movements are normal. Face symmetric. Tongue midline. Normal strength, tone, reflexes and coordination. Normal sensation. Gait deferred.  ASSESSMENT/PLAN [name] is a 51 y.o. female with history ofhistory of hypertension, medication non-compliance, and tobacco abuse who presented to the emergency room at [facility] with complaints of double vision that started about a week ago but can't really worse on Sunday. She did not receive IV t-PA due to arriving outside of the treatment window.   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  MRI head: Acute/subacute nonhemorrhagic infarct involving the superior left temporal lobe, posterior insular cortex, and left frontal operculum  MRA head: moderate superior left M2 division stenosis, mild bilateral distal PCA branch vessel disease.  Carotid Doppler (OSH): B ICA 1-39% stenosis, VAs antegrade  2D Echo (OSH): Mild LVH. EF 60-65%. No source of embolus   LDL 112  HgbA1c 11.1  SCDs for VTE prophylaxis  Diet Carb Modified Fluid consistency: Thin; Room service appropriate? Yes  No antithrombotic prior to admission, now on aspirin 325 mg daily  Patient counseled to be compliant with her antithrombotic medications  Ongoing aggressive stroke risk factor management  Therapy recommendations:  None  Disposition:  pending  Hypertension  Stable  Permissive hypertension (OK if < 220/120) but gradually normalize in 5-7 days  Long-term BP goal normotensive  Hyperlipidemia  Home meds: none  LDL 112, goal < 70  Add atorvastatin 40mg  PO daily  Continue statin at discharge  Diabetes  HgbA1c 11.1, goal < 7.0  Uncontrolled  Other Stroke Risk Factors  Cigarette smoker, advised to stop smoking  Other Active Problems  Needs to re-establish care with PCP. Hospital day # 1  I have personally examined this patient, reviewed notes, independently viewed imaging studies, participated in medical decision making and plan of care.ROS completed by me personally  and pertinent positives fully documented  I have made any additions or clarifications directly to the above note. 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 She also had some monocular diplopia/blurred vision symptoms which are unlikely to be of central nervous system etiology. She may need outpatient ophthalmology  referral to evaluate this further. I counseled the patient to quit smoking and advised her to start taking aspirin daily and start taking medications for diabetes, lipids and blood pressure and have regular follow-up with primary physician. I also reviewed patient's telemetry and not found any atrial fibrillation over 24 hours of continuous monitoring. She may benefit with transesophageal echocardiogram and loop recorder insertion to look for cardiac source of embolism and detection of paroxysmal atrial fibrillation. The patient may also benefit by participation in the [employer] registry and she has expressed interest and will be given information to review and decide. Greater than 50% time during this 35 minute visit was spent on counseling and coordination of care about embolic stroke, counseling about risk factor modification and discussion about stroke evaluation and treatment plan discussed with Dr. Loleta Rose, MD Medical Director [facility] Pager: [phone] 06/26/2017 4:29 PM   To contact Stroke Continuity provider, please refer to [email or web address]. After hours, contact General Neurology

## 2017-06-27 ENCOUNTER — Encounter (HOSPITAL_COMMUNITY): Payer: Self-pay | Admitting: *Deleted

## 2017-06-27 ENCOUNTER — Encounter (HOSPITAL_COMMUNITY)
Admission: EM | Disposition: A | Discharge status: Home / Self Care | Payer: Self-pay | Source: Home / Self Care | Attending: Internal Medicine

## 2017-06-27 ENCOUNTER — Ambulatory Visit (HOSPITAL_COMMUNITY): Admit: 2017-06-27 | Payer: Self-pay | Admitting: Cardiology

## 2017-06-27 ENCOUNTER — Inpatient Hospital Stay (HOSPITAL_COMMUNITY)
Admit: 2017-06-27 | Discharge: 2017-06-27 | Disposition: A | Discharge status: Home / Self Care | Payer: Self-pay | Attending: Physician Assistant | Admitting: Physician Assistant

## 2017-06-27 DIAGNOSIS — I638 Other cerebral infarction: Secondary | ICD-10-CM

## 2017-06-27 HISTORY — PX: TEE WITHOUT CARDIOVERSION: SHX5443

## 2017-06-27 HISTORY — PX: LOOP RECORDER INSERTION: EP1214

## 2017-06-27 LAB — GLUCOSE, CAPILLARY
GLUCOSE-CAPILLARY: 147 mg/dL — AB (ref 65–99)
Glucose-Capillary: 138 mg/dL — ABNORMAL HIGH (ref 65–99)

## 2017-06-27 LAB — PROTIME-INR
INR: 0.97
PROTHROMBIN TIME: 12.9 s (ref 11.4–15.2)

## 2017-06-27 SURGERY — ECHOCARDIOGRAM, TRANSESOPHAGEAL
Anesthesia: Moderate Sedation

## 2017-06-27 SURGERY — LOOP RECORDER INSERTION

## 2017-06-27 MED ORDER — LIDOCAINE VISCOUS 2 % MT SOLN
OROMUCOSAL | Status: DC | PRN
  Administered 2017-06-27: 1 via OROMUCOSAL

## 2017-06-27 MED ORDER — LIDOCAINE VISCOUS 2 % MT SOLN
OROMUCOSAL | Status: AC
  Filled 2017-06-27: qty 15

## 2017-06-27 MED ORDER — MIDAZOLAM HCL 5 MG/ML IJ SOLN
INTRAMUSCULAR | Status: AC
  Filled 2017-06-27: qty 2

## 2017-06-27 MED ORDER — LIDOCAINE-EPINEPHRINE 1 %-1:100000 IJ SOLN
INTRAMUSCULAR | Status: DC | PRN
  Administered 2017-06-27: 15 mL via INTRADERMAL

## 2017-06-27 MED ORDER — FENTANYL CITRATE (PF) 100 MCG/2ML IJ SOLN
INTRAMUSCULAR | Status: AC
  Filled 2017-06-27: qty 2

## 2017-06-27 MED ORDER — MIDAZOLAM HCL 10 MG/2ML IJ SOLN
INTRAMUSCULAR | Status: DC | PRN
  Administered 2017-06-27 (×2): 2 mg via INTRAVENOUS
  Administered 2017-06-27: 1 mg via INTRAVENOUS

## 2017-06-27 MED ORDER — BUTAMBEN-TETRACAINE-BENZOCAINE 2-2-14 % EX AERO
INHALATION_SPRAY | CUTANEOUS | Status: DC | PRN
Start: 2017-06-27 — End: 2017-06-27
  Administered 2017-06-27: 2 via TOPICAL

## 2017-06-27 MED ORDER — INSULIN STARTER KIT- SYRINGES (ENGLISH): 1.0000 | Freq: Once | 0 refills | Status: AC

## 2017-06-27 MED ORDER — INSULIN GLARGINE 100 UNIT/ML ~~LOC~~ SOLN: 5.0000 [IU] | Freq: Every day | SUBCUTANEOUS | 11 refills | Status: DC

## 2017-06-27 MED ORDER — FENTANYL CITRATE (PF) 100 MCG/2ML IJ SOLN
INTRAMUSCULAR | Status: DC | PRN
  Administered 2017-06-27 (×2): 25 ug via INTRAVENOUS

## 2017-06-27 MED ORDER — LIDOCAINE-EPINEPHRINE 1 %-1:100000 IJ SOLN
INTRAMUSCULAR | Status: AC
  Filled 2017-06-27: qty 1

## 2017-06-27 MED ORDER — INSULIN ASPART 100 UNIT/ML ~~LOC~~ SOLN
3.0000 [IU] | Freq: Three times a day (TID) | SUBCUTANEOUS | Status: DC
  Administered 2017-06-27: 3 [IU] via SUBCUTANEOUS

## 2017-06-27 MED ORDER — INSULIN STARTER KIT- SYRINGES (ENGLISH)
1.0000 | Freq: Once | Status: DC
  Filled 2017-06-27: qty 1

## 2017-06-27 SURGICAL SUPPLY — 2 items
LOOP REVEAL LINQSYS (Prosthesis & Implant Heart) ×3 IMPLANT
PACK LOOP INSERTION (CUSTOM PROCEDURE TRAY) ×3 IMPLANT

## 2017-06-27 NOTE — Progress Notes (Signed)
  Echocardiogram Echocardiogram Transesophageal has been performed.  Leta JunglingCooper, Anthone Prieur M 06/27/2017, 2:43 PM

## 2017-06-27 NOTE — Progress Notes (Signed)
Inpatient Diabetes Program Recommendations  AACE/ADA: New Consensus Statement on Inpatient Glycemic Control (2015)  Target Ranges:  Prepandial:   less than 140 mg/dL      Peak postprandial:   less than 180 mg/dL (1-2 hours)      Critically ill patients:  140 - 180 mg/dL   Results for Tammy Bauer, Tammy Bauer (MRN [id number]) as of 06/27/2017 13:48  Ref. Range 06/27/2017 06:19 06/27/2017 11:55  Glucose-Capillary Latest Ref Range: 65 - 99 mg/dL 147 (H) 138 (H)   Results for Tammy Bauer, Tammy Bauer (MRN [id number]) as of 06/27/2017 13:48  Ref. Range 06/25/2017 05:31  Hemoglobin A1C Latest Ref Range: 4.8 - 5.6 % 11.1 (H)      Admitted with new diagnosis CVA and new diagnosis of DM.  Spoke with pt and her husband about new diagnosis.  Discussed basic pathophysiology of DM Type 2, basic home care, basic diabetes diet nutrition principles, importance of checking CBGs and maintaining good CBG control to prevent long-term and short-term complications.  Reviewed signs and symptoms of hyperglycemia and hypoglycemia and how to treat both hyperglycemia and hypoglycemia at home.  Also reviewed blood sugar goals and A1c goals for home.  Also discussed DM diet information with patient.  Encouraged patient to avoid beverages with sugar (regular soda, sweet tea, lemonade, fruit juice) and to consume mostly water.  Discussed what foods contain carbohydrates and how carbohydrates affect the body's blood sugar levels.  Encouraged patient to be careful with her portion sizes (especially grains, starchy vegetables, and fruits).  Explained to patient that women should have 45-60 grams of carbohydrates per meal per day.   RNs to provide ongoing basic DM education at bedside with this patient.  Have ordered educational booklet, insulin starter kit, and DM videos.  Have also placed RD consult for DM diet education for this patient.  Gave patient information on purchasing an inexpensive CBG meter and strips at [employer].  Meter is $9 and a box  of 50 strips is $9.  Encouraged pt to get established with a PCP with one of the physicians that Care Management suggested to patient.     MD- Note that pt's A1c was 11.1%.  Note that Dr. Josem Kaufmann notes state that the plan is to discharge patient home on Metformin and Amaryl.  Patient stated to me today that she thinks that Dr. Tana Coast may have also stated something about going home on one oral medication and one shot of insulin per day.  Have asked RNs to allow pt to draw up and administer insulin today for practice.  CBGs fairly well controlled on minimal amount of basal insulin.       --Will follow patient during hospitalization--  Wyn Quaker RN, MSN, CDE Diabetes Coordinator Inpatient Glycemic Control Team Team Pager: [phone] (8a-5p)

## 2017-06-27 NOTE — Consult Note (Signed)
ELECTROPHYSIOLOGY CONSULT NOTE  Patient ID: Tammy GaskinsMela Shelton MRN: [id number], DOB/AGE: [birth year]   Admit date: 06/24/2017 Date of Consult: 06/27/2017  Primary Physician: Johna SheriffVincent, Carol L, MD Primary [facility]ologist[facility] Reason for Consultation: Cryptogenic stroke; recommendations regarding Implantable Loop Recorder  History of Present Illness Tammy FerriesMela Renae Bauer is a 51 y.o. female whom EP has been asked to see by Dr Pearlean BrownieSethi to evaluate for implantable loop recorder in the setting of cryptogenic stroke. She was admitted on 06/24/2017 with a 1 week history of double vision that waxed and waned.  Imaging demonstrated acute non hemorrhagic infarct involving the superior left temporal lobe, posterior insular cortex, and left frontal operculum felt to be embolic 2/2 unknown source.  She has undergone workup for stroke including echocardiogram and carotid dopplers.  The patient has been monitored on telemetry which has demonstrated sinus rhythm with no arrhythmias.  Inpatient stroke work-up is to be completed with a TEE.   Echocardiogram this admission demonstrated EF 55-60%, mild LVH, no RWMA, LA 33.  Lab work is reviewed.  Prior to admission, the patient denies chest pain, shortness of breath, dizziness, palpitations, or syncope.  They are recovering from their stroke with plans to return home at discharge.  EP has been asked to evaluate for placement of an implantable loop recorder to monitor for atrial fibrillation.   Past Medical History:  Diagnosis Date  . Hypertension      Surgical History: History reviewed. No pertinent surgical history.   Prescriptions Prior to Admission  Medication Sig Dispense Refill Last Dose  . acetaminophen (TYLENOL) 325 MG tablet Take 650 mg by mouth every 6 (six) hours as needed for mild pain or moderate pain.    Past Week at Unknown time  . [DISCONTINUED] amLODipine (NORVASC) 10 MG tablet TAKE ONE TABLET BY MOUTH ONCE DAILY 30 tablet 11 06/24/2017 at Unknown  time  . [DISCONTINUED] losartan (COZAAR) 50 MG tablet Take 1 tablet (50 mg total) by mouth daily. (Patient not taking: Reported on 06/24/2017) 90 tablet 3 Not Taking at Unknown time  . [DISCONTINUED] meclizine (ANTIVERT) 25 MG tablet Take 1 tablet (25 mg total) by mouth 3 (three) times daily as needed for dizziness. (Patient not taking: Reported on 06/24/2017) 30 tablet 0 Not Taking at Unknown time  . [DISCONTINUED] metoCLOPramide (REGLAN) 10 MG tablet Take 1 tablet (10 mg total) by mouth every 6 (six) hours as needed for nausea (or headache). (Patient not taking: Reported on 06/24/2017) 30 tablet 0 Not Taking at Unknown time  . [DISCONTINUED] traMADol (ULTRAM) 50 MG tablet Take 1 tablet (50 mg total) by mouth every 6 (six) hours as needed. (Patient not taking: Reported on 06/24/2017) 15 tablet 0 Not Taking at Unknown time    Inpatient Medications:  . [MAR Hold]  stroke: mapping our early stages of recovery book   Does not apply Once  . [MAR Hold] aspirin  300 mg Rectal Daily   Or  . [MAR Hold] aspirin  325 mg Oral Daily  . [MAR Hold] atorvastatin  40 mg Oral q1800  . [MAR Hold] heparin  5,000 Units Subcutaneous Q8H  . [MAR Hold] insulin aspart  0-5 Units Subcutaneous QHS  . [MAR Hold] insulin aspart  0-9 Units Subcutaneous TID WC  . [MAR Hold] insulin aspart  3 Units Subcutaneous TID WC  . [MAR Hold] insulin glargine  5 Units Subcutaneous Daily    Allergies:  Allergies  Allergen Reactions  . Ace Inhibitors Cough    Social History  Social History  . Marital status: Married    Spouse name: N/A  . Number of children: N/A  . Years of education: N/A   Occupational History  . Not on file.   Social History Main Topics  . Smoking status: Current Every Day Smoker    Packs/day: 0.50    Types: Cigarettes    Start date: 08/29/1999  . Smokeless tobacco: Never Used  . Alcohol use No  . Drug use: No  . Sexual activity: Not on file   Other Topics Concern  . Not on file   Social History  Narrative  . No narrative on file     Family History: no premature CAD     Review of Systems: All other systems reviewed and are otherwise negative except as noted above.  Physical Exam: Vitals:   06/27/17 0454 06/27/17 0900 06/27/17 1313 06/27/17 1318  BP: (!) 163/84 (!) 178/97  (!) 212/110  Pulse: 71 67    Resp: 20 20  12   Temp: 98 F (36.7 C) 98.3 F (36.8 C) 98.6 F (37 C) 98.3 F (36.8 C)  TempSrc: Oral Oral Oral Oral  SpO2: 98% 98%  100%  Weight:      Height:        GEN- The patient is well appearing, alert and oriented x 3 today.   Head- normocephalic, atraumatic Eyes-  Sclera clear, conjunctiva pink Ears- hearing intact Oropharynx- clear Neck- supple Lungs- Clear to ausculation bilaterally, normal work of breathing Heart- Regular rate and rhythm, no murmurs, rubs or gallops  GI- soft, NT, ND, + BS Extremities- no clubbing, cyanosis, or edema MS- no significant deformity or atrophy Skin- no rash or lesion Psych- euthymic mood, full affect   Labs:   Lab Results  Component Value Date   WBC 6.2 06/26/2017   HGB 15.4 (H) 06/26/2017   HCT 45.2 06/26/2017   MCV 93.4 06/26/2017   PLT 188 06/26/2017    Recent Labs Lab 06/26/17 0149  NA 134*  K 3.5  CL 102  CO2 24  BUN 9  CREATININE 0.65  CALCIUM 9.5  GLUCOSE 205*     Radiology/Studies: Ct Head Wo Contrast  Result Date: 06/24/2017 CLINICAL DATA:  Hypertension with double vision EXAM: CT HEAD WITHOUT CONTRAST TECHNIQUE: Contiguous axial images were obtained from the base of the skull through the vertex without intravenous contrast. COMPARISON:  05/22/2015 FINDINGS: Brain: No hemorrhage or intracranial mass is seen. There is hypodensity within the left temporal lobe an left insula/ sub insula felt consistent with remote infarct. Small lacunar infarct within the left parasagittal frontal lobe. No midline shift. The ventricles are nonenlarged. Possible subtle hypodensity within the left occipital lobe.  Vascular: No hyperdense vessels.  Carotid artery calcifications. Skull: No fracture or suspicious bone lesion. Sinuses/Orbits: Minimal mucosal thickening in the ethmoid sinuses. No acute orbital abnormality. Other: None IMPRESSION: 1. Negative for acute intracranial hemorrhage 2. 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 3. Mild focal hypodensity within the left parasagittal occipital lobe, cannot exclude small area of edema/acute infarct, suggest MRI for further evaluation. Electronically Signed   By: Jasmine PangKim  Fujinaga M.D.   On: 06/24/2017 21:25   Mr Brain Wo Contrast  Result Date: 06/25/2017 CLINICAL DATA:  Acute CVA.  Diplopia.  Left ear pain. EXAM: MRI HEAD WITHOUT CONTRAST MRA HEAD WITHOUT CONTRAST TECHNIQUE: Multiplanar, multiecho pulse sequences of the brain and surrounding structures were obtained without intravenous contrast. Angiographic images of the head were  obtained using MRA technique without contrast. COMPARISON:  CT head without contrast 06/24/2017 FINDINGS: MRI HEAD FINDINGS Brain: Diffusion-weighted images confirm an acute/subacute nonhemorrhagic infarct involving the posterior insular ribbon, left superior temporal gyrus, and left frontal operculum. There is no associated hemorrhage. Cortical T2 changes are associated. Other scattered subcortical T2 hyperintensities are present bilaterally. There is a remote lacunar infarct involving the left corona radiata. The brainstem and cerebellum are normal. Vascular: Flow is present in the major intracranial arteries. Skull and upper cervical spine: The skullbase is within normal limits. The craniocervical junction is normal. Midline sagittal structures are unremarkable. Sinuses/Orbits: The globes and orbits are within normal limits. The paranasal sinuses and mastoid air cells are clear. MRA HEAD FINDINGS The internal carotid artery is are within normal limits from the high cervical segments through the  ICA termini bilaterally. The A1 and M1 segments are normal. No definite anterior communicating artery is present. The right MCA bifurcation is intact. There is moderate attenuation involving the proximal superior M2 division. The inferior posterior left into deviation is unremarkable. The vertebral arteries are codominant. PICA origins are visualized and normal. The vertebrobasilar junction is normal. Both posterior cerebral arteries originate from the basilar tip. There is some attenuation distal PCA branch vessels. IMPRESSION: 1. Acute/subacute nonhemorrhagic infarct involving the superior left temporal lobe, posterior insular cortex, and left frontal operculum. 2. Other white matter disease and a remote lacunar infarct involving the left corona radiata suggests microvascular disease. 3. Focal moderate stenosis involving the superior left M2 division without focal occlusion. 4. Mild distal branch vessel disease within the posterior cerebral arteries bilaterally. Electronically Signed   By: Marin Roberts M.D.   On: 06/25/2017 08:21   US Carotid Bilateral (at [facility] And [facility] Only)  Result Date: 06/25/2017 CLINICAL DATA:  Acute CVA. Visual disturbance. Syncopal episode. History of hypertension, hyperlipidemia, diabetes and smoking. EXAM: BILATERAL CAROTID DUPLEX ULTRASOUND TECHNIQUE: Wallace Cullens scale imaging, color Doppler and duplex ultrasound were performed of bilateral carotid and vertebral arteries in the neck. COMPARISON:  None. FINDINGS: Criteria: Quantification of carotid stenosis is based on velocity parameters that correlate the residual internal carotid diameter with NASCET-based stenosis levels, using the diameter of the distal internal carotid lumen as the denominator for stenosis measurement. The following velocity measurements were obtained: RIGHT ICA:  72/31 cm/sec CCA:  64/22 cm/sec SYSTOLIC ICA/CCA RATIO:  1.1 DIASTOLIC ICA/CCA RATIO:  1.4 ECA:  92 cm/sec LEFT ICA:  51/17 cm/sec CCA:  71/21 cm/sec  SYSTOLIC ICA/CCA RATIO:  0.7 DIASTOLIC ICA/CCA RATIO:  0.8 ECA:  68 cm/sec RIGHT CAROTID ARTERY: There is a minimal amount of eccentric mixed echogenic plaque seen scattered throughout the right common carotid artery. There is a moderate amount of eccentric mixed echogenic plaque within the right carotid bulb (image 17), extending to involve the origin and proximal aspects of the right internal carotid artery (image 25), not definitely resulting in elevated peak systolic velocities within the interrogated course the right internal carotid artery to suggest a hemodynamically significant stenosis. Elevated peak systolic velocity within the distal aspect the right internal carotid artery is felt to be factitiously elevated due to sampling at a location of turbulent flow and tortuosity. RIGHT VERTEBRAL ARTERY:  Antegrade Flow LEFT CAROTID ARTERY: There is a minimal amount of intimal thickening throughout the left common carotid artery. There is a minimal amount of intimal thickening and hypoechoic plaque within the left carotid bulb (image 52), extending to involve the origin and proximal aspects of the left internal carotid artery (  image 60), not resulting in elevated peak systolic velocities within the interrogated course of the left internal carotid artery to suggest a hemodynamically significant stenosis. LEFT VERTEBRAL ARTERY:  Antegrade flow IMPRESSION: Minimal to moderate amount of bilateral atherosclerotic plaque, right greater than left, not resulting in a hemodynamically significant stenosis within either internal carotid artery. Electronically Signed   By: Simonne Come M.D.   On: 06/25/2017 08:45   Mr Maxine Glenn Head/brain ZO Cm  Result Date: 06/25/2017 CLINICAL DATA:  Acute CVA.  Diplopia.  Left ear pain. EXAM: MRI HEAD WITHOUT CONTRAST MRA HEAD WITHOUT CONTRAST TECHNIQUE: Multiplanar, multiecho pulse sequences of the brain and surrounding structures were obtained without intravenous contrast. Angiographic  images of the head were obtained using MRA technique without contrast. COMPARISON:  CT head without contrast 06/24/2017 FINDINGS: MRI HEAD FINDINGS Brain: Diffusion-weighted images confirm an acute/subacute nonhemorrhagic infarct involving the posterior insular ribbon, left superior temporal gyrus, and left frontal operculum. There is no associated hemorrhage. Cortical T2 changes are associated. Other scattered subcortical T2 hyperintensities are present bilaterally. There is a remote lacunar infarct involving the left corona radiata. The brainstem and cerebellum are normal. Vascular: Flow is present in the major intracranial arteries. Skull and upper cervical spine: The skullbase is within normal limits. The craniocervical junction is normal. Midline sagittal structures are unremarkable. Sinuses/Orbits: The globes and orbits are within normal limits. The paranasal sinuses and mastoid air cells are clear. MRA HEAD FINDINGS The internal carotid artery is are within normal limits from the high cervical segments through the ICA termini bilaterally. The A1 and M1 segments are normal. No definite anterior communicating artery is present. The right MCA bifurcation is intact. There is moderate attenuation involving the proximal superior M2 division. The inferior posterior left into deviation is unremarkable. The vertebral arteries are codominant. PICA origins are visualized and normal. The vertebrobasilar junction is normal. Both posterior cerebral arteries originate from the basilar tip. There is some attenuation distal PCA branch vessels. IMPRESSION: 1. Acute/subacute nonhemorrhagic infarct involving the superior left temporal lobe, posterior insular cortex, and left frontal operculum. 2. Other white matter disease and a remote lacunar infarct involving the left corona radiata suggests microvascular disease. 3. Focal moderate stenosis involving the superior left M2 division without focal occlusion. 4. Mild distal branch  vessel disease within the posterior cerebral arteries bilaterally. Electronically Signed   By: Marin Roberts M.D.   On: 06/25/2017 08:21    12-lead ECG sinus rhythm All prior EKG's in EPIC reviewed with no documented atrial fibrillation  Telemetry sinus rhythm  Assessment and Plan:  1. Cryptogenic stroke The patient presents with cryptogenic stroke.  The patient has a TEE planned for this afternoon.  I spoke at length with the patient about monitoring for afib with an implantable loop recorder.  Risks, benefits, and alteratives to implantable loop recorder were discussed with the patient today.   At this time, the patient is very clear in their decision to proceed with implantable loop recorder.   She does not currently have insurance but is applying for Medicaid.  We have discussed monitoring and she would like to proceed with implant at this time.   Wound care was reviewed with the patient (keep incision clean and dry for 3 days).    Please call with questions.   Gypsy Balsam, NP 06/27/2017 1:48 PM  I have seen and examined this patient with Gypsy Balsam.  Agree with above, note added to reflect my findings.  On exam, RRR, no murmurs, lungs  clear. Had CVA associated with blurry vision. TEE without issues of clot. Plan for LINQ implant. Risks and benefits explained. Risks include but not limited to bleeding and infection. The patient understands the risks and has agreed to the procedure.    Maliea Grandmaison M. Notnamed Croucher MD 06/27/2017 4:07 PM

## 2017-06-27 NOTE — Progress Notes (Signed)
Patient is off the floor to a procedure

## 2017-06-27 NOTE — Progress Notes (Signed)
STROKE TEAM PROGRESS NOTE   HISTORY OF PRESENT ILLNESS (per record) Tammy Bauer is a 51 y.o. female with a history of hypertension, medicatiLucia Gaskinson non-compliance, and tobacco abuse who presented to the emergency room at [facility] with complaints of double vision that started about a week ago but can't really worse on Sunday. Next line should reported seeing double from both eyes, images side-to-side with no improvement if she covered one eye.  Her blood pressure on arrival systolic was 220s. She was admitted for evaluation of possible posterior reversible encephalopathy syndrome versus stroke. MRI imaging was obtained which revealed evidence of stroke.  [facility] because of unavailability of tele[facility] beds at [facility].  Denies any current headaches. Reports occasional headaches. Next line denies any nausea or vomiting. Next line to managing fevers chills next line denies any weight gain or weight loss.  Patient does not report any easy bruisability or bleeding. Denies any clotting disorders. Denies any history of miscarriages.  Pt is a current smoker and newly-diagnosed diabetic with an a1C of 11.1.  LKW: At least 7 days ago Premorbid modified Rankin scale (mRS): 0 NIHSS: 0-Completely asymptomatic and back to baseline post-stroke   Patient was not administered IV t-PA secondary to arriving outside of the treatment window. She was admitted to General Neurology for further evaluation and treatment.   SUBJECTIVE (INTERVAL HISTORY) Her husband is at the bedside.  The patient  Is doing well and has no complaints.  OBJECTIVE Temp:  [97.8 F (36.6 C)-98.6 F (37 C)] 98.3 F (36.8 C) (07/25 1318) Pulse Rate:  [67-72] 67 (07/25 0900) Cardiac Rhythm: Normal sinus rhythm (07/25 0738) Resp:  [12-20] 12 (07/25 1318) BP: (113-212)/(73-110) 212/110 (07/25 1318) SpO2:  [98 %-100 %] 100 % (07/25 1318)  CBC:   Recent Labs Lab 06/24/17 1958 06/26/17 0149  WBC 7.5 6.2   NEUTROABS  --  2.7  HGB 16.2* 15.4*  HCT 46.8* 45.2  MCV 94.4 93.4  PLT 171 188    Basic Metabolic Panel:   Recent Labs Lab 06/24/17 1958 06/26/17 0149  NA 141 134*  K 3.4* 3.5  CL 105 102  CO2 27 24  GLUCOSE 284* 205*  BUN 12 9  CREATININE 0.57 0.65  CALCIUM 10.1 9.5    Lipid Panel:     Component Value Date/Time   CHOL 184 06/25/2017 0531   TRIG 204 (H) 06/25/2017 0531   HDL 31 (L) 06/25/2017 0531   CHOLHDL 5.9 06/25/2017 0531   VLDL 41 (H) 06/25/2017 0531   LDLCALC 112 (H) 06/25/2017 0531   HgbA1c:  Lab Results  Component Value Date   HGBA1C 11.1 (H) 06/25/2017   Urine Drug Screen: No results found for: LABOPIA, COCAINSCRNUR, LABBENZ, AMPHETMU, THCU, LABBARB  Alcohol Level No results found for: ETH  IMAGING  Ct Head Wo Contrast 06/24/2017 IMPRESSION: 1. Negative for acute intracranial hemorrhage 2. 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 3. Mild focal hypodensity within the left parasagittal occipital lobe, cannot exclude small area of edema/acute infarct, suggest MRI for further evaluation.    Tammy Bauer 62Wo Contrast Tammy Maxine GlennMra Head/Bauer Wo Cm 06/25/2017 IMPRESSION: 1. Acute/subacute nonhemorrhagic infarct involving the superior left temporal lobe, posterior insular cortex, and left frontal operculum. 2. Other white matter disease and a remote lacunar infarct involving the left corona radiata suggests microvascular disease. 3. Focal moderate stenosis involving the superior left M2 division without focal occlusion. 4. Mild distal branch vessel  disease within the posterior cerebral arteries bilaterally.  US Carotid Bilateral (at [facility] And Ap Only) 06/25/2017 IMPRESSION: Minimal to moderate amount of bilateral atherosclerotic plaque, right greater than left, not resulting in a hemodynamically significant stenosis within either internal carotid artery.  2D Echo 06/25/2017 Study Conclusions - Left ventricle: The  cavity size was normal. Wall thickness was   increased in a pattern of mild LVH. Systolic function was normal.  The estimated ejection fraction was in the range of 55% to 60%.  Wall motion was normal; there were no regional wall motion abnormalities. - Aortic valve: Valve area (VTI): 2.83 cm^2. Valve area (Vmax): 2.58 cm^2. Valve area (Vmean): 2.46 cm^2. - Technically adequate study.    PHYSICAL EXAM Frail middle aged african american lady not in distress. . Afebrile. Head is nontraumatic. Neck is supple without bruit.    Cardiac exam no murmur or gallop. Lungs are clear to auscultation. Distal pulses are well felt. Neurological Exam ;  Awake  Alert oriented x 3. Normal speech and language.eye movements full without nystagmus.fundi were not visualized. Vision acuity and fields appear normal. Hearing is normal. Palatal movements are normal. Face symmetric. Tongue midline. Normal strength, tone, reflexes and coordination. Normal sensation. Gait deferred.  ASSESSMENT/PLAN Tammy Bauer is a 51 y.o. female with history ofhistory of hypertension, medication non-compliance, and tobacco abuse who presented to the emergency room at [facility] with complaints of double vision that started about a week ago but can't really worse on Sunday. She did not receive IV t-PA due to arriving outside of the treatment window.   Stroke: Acute/subacute nonhemorrhagic infarct involving the superior left temporal lobe, posterior insular cortex, and left frontal operculum, likely embolic   undetermined etiology ;embolic source of unknown source  Resultant  No deficits  CT head: Old left temporal, parietal lobe and adjacent insula/sub insula infarcts and mild focal hypodensity within the left parasagittal occipital lobe suggesting acute infarct.  MRI head: Acute/subacute nonhemorrhagic infarct involving the superior left temporal lobe, posterior insular cortex, and left frontal operculum  MRA head: moderate  superior left M2 division stenosis, mild bilateral distal PCA branch vessel disease.  Carotid Doppler (OSH): B ICA 1-39% stenosis, VAs antegrade  2D Echo (OSH): Mild LVH. EF 60-65%. No source of embolus   LDL 112  HgbA1c 11.1  SCDs for VTE prophylaxis Diet Carb Modified Diet NPO time specified Except for: Sips with Meds  No antithrombotic prior to admission, now on aspirin 325 mg daily  Patient counseled to be compliant with her antithrombotic medications  Ongoing aggressive stroke risk factor management  Therapy recommendations:  None Disposition:  Home  Hypertension  Stable  Permissive hypertension (OK if < 220/120) but gradually normalize in 5-7 days  Long-term BP goal normotensive  Hyperlipidemia  Home meds: none  LDL 112, goal < 70  Add atorvastatin 40mg  PO daily  Continue statin at discharge  Diabetes  HgbA1c 11.1, goal < 7.0  Uncontrolled  Other Stroke Risk Factors  Cigarette smoker, advised to stop smoking  Other Active Problems  Needs to re-establish care with PCP. Hospital day # 2    She also had some monocular diplopia/blurred vision symptoms which are unlikely to be of central nervous system etiology. She may need outpatient ophthalmology referral to evaluate this further. I counseled the patient to quit smoking and advised her to start taking aspirin daily and start taking medications for diabetes, lipids and blood pressure and have regular follow-up with primary physician. I also reviewed  patient's telemetry and not found any atrial fibrillation over 24 hours of continuous monitoring. She may benefit with transesophageal echocardiogram and loop recorder insertion to look for cardiac source of embolism and detection of paroxysmal atrial fibrillation. The patient may also benefit by participation in the [employer] [facility] registry and she has expressed interest and will be given information to review and decide.  .  Await TEE and loop recorder insertion.  Follow-up as an outpatient in stroke clinic in 6 weeks. Any call for questions.  Delia HeadyPramod Sethi, MD Medical Director [facility] Pager: [phone] 06/27/2017 1:25 PM   To contact Stroke Continuity provider, please refer to [email or web address]. After hours, contact General Neurology

## 2017-06-27 NOTE — Care Management Note (Signed)
Case Management Note  Patient Details  Name: Tammy GaskinsMela Bauer MRN: [id number] Date of Birth: [birth year]  Subjective/Objective:                    Action/Plan: Pt discharging home with self care. CM provided pt with MATCH letter to assist with the cost of her medications at d/c. Pt also [facility] with [facility]. Patient to f/[facility] with [facility] tomorrow [facility].  Per pt and family she will have 24/7 support at home.  Pt has transportation home.   Expected Discharge Date:                  Expected Discharge Plan:  Home/Self Care  In-House Referral:     Discharge planning Services  CM Consult, Medication Assistance, MATCH Program  Post Acute Care Choice:    Choice offered to:     DME Arranged:    DME Agency:     HH Arranged:    HH Agency:     Status of Service:  Completed, signed off  If discussed at [employer] Length of Stay Meetings, dates discussed:    Additional Comments:  Kermit BaloKelli F Crystol Walpole, RN 06/27/2017, 4:53 PM

## 2017-06-27 NOTE — Discharge Summary (Signed)
Physician Discharge Summary  Tammy Bauer BSJ:[id number] DOB: 1966-08-20 DOA: 06/24/2017  PCP: Eustaquio Maize, MD  Admit date: 06/24/2017 Discharge date: 06/27/2017  Admitted From: Home Discharge disposition: Home   Recommendations for Outpatient Follow-Up:   1. Patient will need close outpatient follow-up of glycemic and blood pressure control.   Discharge Diagnosis:   Principal Problem:    Acute CVA (cerebrovascular accident) [facility]) Active Problems:    Hypertensive emergency    Tobacco abuse  Discharge Condition: Improved.  Diet recommendation: Low sodium, heart healthy.  Carbohydrate-modified.   History of Present Illness:   51 year old woman with a PMH of hypertension, noncompliant with medications, and tobacco abuse who was admitted with a chief complaint of blurry vision. Found to be hypertensive on presentation. Initial CT showed a mild focal hypodensity in the left parasagittal occipital lobe concerning for acute infarction.  Hospital Course by Problem:   Principal Problem:   Acute CVA (cerebrovascular accident) ([place]) Acute CVA confirmed by MRI. t-PA not administered secondary to falling outside the treatment window. Full stroke workup initiated. Neurology following with recommendations to proceed with TEE, which was done prior to discharge in addition to placement of a loop recorder. Otherwise, needs better risk factor modification. Continue aspirin.  Active Problems:   Hyperlipidemia Statin therapy initiated for an LDL of 112.     Hypertensive emergency Permissive hypertension allowed for the first 24-48 hours. Blood pressure 160s-190s/70s-80s. Discharged on Norvasc but will need close follow-up for ongoing blood pressure control.    Tobacco abuse Counseled.    Uncontrolled diabetes with complications Hemoglobin A1c 11.1%. Currently being managed with Lantus 5 units and insulin sensitive SSI with reasonably good control on this regimen. We'll  discharge on 5 units of Lantus and metformin with close outpatient follow-up.    HIV screening Done, nonreactive.  Medical Consultants:    Neurology  Electrophysiology   Discharge Exam:   Vitals:   06/27/17 1435 06/27/17 1440  BP: (!) 151/82   Pulse: 78 77  Resp: (!) 22 (!) 25  Temp:     Vitals:   06/27/17 1425 06/27/17 1434 06/27/17 1435 06/27/17 1440  BP:   (!) 151/82   Pulse: 82 72 78 77  Resp: (!) 23 16 (!) 22 (!) 25  Temp:      TempSrc:      SpO2: 98% 94% 95% 97%  Weight:      Height:        General exam: Appears calm and comfortable. Frail, no distress. Respiratory system: Clear to auscultation. Respiratory effort normal. Cardiovascular system: S1 & S2 heard, RRR. No JVD,  rubs, gallops or clicks. No murmurs. Gastrointestinal system: Abdomen is nondistended, soft and nontender. No organomegaly or masses felt. Normal bowel sounds heard. Central nervous system: Alert and oriented. No focal neurological deficits. Normal speech. No visual field cuts. Extremities: No clubbing,  or cyanosis. No edema. Skin: No rashes, lesions or ulcers. Psychiatry: Judgement and insight appear fair. Mood & affect appropriate.    The results of significant diagnostics from this hospitalization (including imaging, microbiology, ancillary and laboratory) are listed below for reference.     Procedures and Diagnostic Studies:   No results found.   Labs:   Basic Metabolic Panel:  Recent Labs Lab 06/24/17 1958 06/26/17 0149  NA 141 134*  K 3.4* 3.5  CL 105 102  CO2 27 24  GLUCOSE 284* 205*  BUN 12 9  CREATININE 0.57 0.65  CALCIUM 10.1 9.5   GFR Estimated Creatinine Clearance:  75.7 mL/min (by C-G formula based on SCr of 0.65 mg/dL). Liver Function Tests: No results for input(s): AST, ALT, ALKPHOS, BILITOT, PROT, ALBUMIN in the last 168 hours. No results for input(s): LIPASE, AMYLASE in the last 168 hours. No results for input(s): AMMONIA in the last 168  hours. Coagulation profile  Recent Labs Lab 06/27/17 1023  INR 0.97    CBC:  Recent Labs Lab 06/24/17 1958 06/26/17 0149  WBC 7.5 6.2  NEUTROABS  --  2.7  HGB 16.2* 15.4*  HCT 46.8* 45.2  MCV 94.4 93.4  PLT 171 188   Cardiac Enzymes: No results for input(s): CKTOTAL, CKMB, CKMBINDEX, TROPONINI in the last 168 hours. BNP: Invalid input(s): POCBNP CBG:  Recent Labs Lab 06/26/17 1134 06/26/17 1621 06/26/17 2114 06/27/17 0619 06/27/17 1155  GLUCAP 233* 96 114* 147* 138*   D-Dimer No results for input(s): DDIMER in the last 72 hours. Hgb A1c  Recent Labs  06/25/17 0531  HGBA1C 11.1*   Lipid Profile  Recent Labs  06/25/17 0531  CHOL 184  HDL 31*  LDLCALC 112*  TRIG 204*  CHOLHDL 5.9   Thyroid function studies No results for input(s): TSH, T4TOTAL, T3FREE, THYROIDAB in the last 72 hours.  Invalid input(s): FREET3 Anemia work up No results for input(s): VITAMINB12, FOLATE, FERRITIN, TIBC, IRON, RETICCTPCT in the last 72 hours. Microbiology No results found for this or any previous visit (from the past 240 hour(s)).   Discharge Instructions:   Discharge Instructions    Ambulatory referral to Neurology    Complete by:  As directed    An appointment is requested in approximately: 6 Week(s): for stroke   Call MD for:  difficulty breathing, headache or visual disturbances    Complete by:  As directed    Call MD for:  extreme fatigue    Complete by:  As directed    Call MD for:  persistant dizziness or light-headedness    Complete by:  As directed    Call MD for:  persistant nausea and vomiting    Complete by:  As directed    Diet - low sodium heart healthy    Complete by:  As directed    Diet Carb Modified    Complete by:  As directed    Diet Carb Modified    Complete by:  As directed    Discharge instructions    Complete by:  As directed    It is VERY IMPORTANT that you follow up with a PCP on a regular basis.  Check your blood glucose  fasting every morning before breakfast and if possible at bedtime and maintain a log of your readings.  Bring this log with you when you follow up with your PCP so that he or she can adjust your medications at your follow up visit.   Increase activity slowly    Complete by:  As directed    Increase activity slowly    Complete by:  As directed      Allergies as of 06/27/2017      Reactions   Ace Inhibitors Cough      Medication List    TAKE these medications   acetaminophen 325 MG tablet Commonly known as:  TYLENOL Take 650 mg by mouth every 6 (six) hours as needed for mild pain or moderate pain.   amLODipine 10 MG tablet Commonly known as:  NORVASC Take 1 tablet (10 mg total) by mouth daily. TAKE ONE TABLET BY MOUTH ONCE DAILY What  changed:  how much to take  how to take this  when to take this   aspirin 325 MG tablet Take 1 tablet (325 mg total) by mouth daily.   atorvastatin 40 MG tablet Commonly known as:  LIPITOR Take 1 tablet (40 mg total) by mouth at bedtime. Any generic statin from [employer] list is okay   blood glucose meter kit and supplies Kit Dispense based on patient and insurance preference. Use up to four times daily as directed. (FOR ICD-9 250.00, 250.01).   hydrALAZINE 10 MG tablet Commonly known as:  APRESOLINE Take 1 tablet (10 mg total) by mouth 3 (three) times daily.   insulin glargine 100 UNIT/ML injection Commonly known as:  LANTUS Inject 0.05 mLs (5 Units total) into the skin daily.   insulin starter kit- syringes Misc 1 kit by Other route once.   metFORMIN 1000 MG tablet Commonly known as:  GLUCOPHAGE Take 1 tablet (1,000 mg total) by mouth 2 (two) times daily with a meal.      Follow-up Information    Center, [place]. Schedule an appointment as soon as possible for a visit in 2 week(s).   Contact information: [address] [place] [address] [phone]        Garvin Fila, MD. Schedule an appointment as soon  as possible for a visit in 6 week(s).   Specialties:  Neurology, Radiology Contact information: [address] [place] [address] [phone]            Time coordinating discharge: 35 minutes.  Signed:  Sanjna Haskew  Pager [phone] [facility] 06/27/2017, 6:35 PM

## 2017-06-27 NOTE — H&P (Signed)
   INTERVAL PROCEDURE H&P  History and Physical Interval Note:  06/27/2017 1:13 PM  Tammy Bauer has presented today for their planned procedure. The various methods of treatment have been discussed with the patient and family. After consideration of risks, benefits and other options for treatment, the patient has consented to the procedure.  The patients' outpatient history has been reviewed, patient examined, and no change in status from most recent office note within the past 30 days. I have reviewed the patients' chart and labs and will proceed as planned. Questions were answered to the patient's satisfaction.   Tammy NoseKenneth Tammy. Ashe [name], MD, [facility]ACC  [place]  [facility]  Attending Cardiologist  Direct Dial: [phone]  Fax: [phone]  Website:  [email or web address]  Tammy Bauer 06/27/2017, 1:13 PM

## 2017-06-27 NOTE — CV Procedure (Signed)
TRANSESOPHAGEAL ECHOCARDIOGRAM (TEE) NOTE  INDICATIONS: cryptogenic stroke  PROCEDURE:   Informed consent was obtained prior to the procedure. The risks, benefits and alternatives for the procedure were discussed and the patient comprehended these risks.  Risks include, but are not limited to, cough, sore throat, vomiting, nausea, somnolence, esophageal and stomach trauma or perforation, bleeding, low blood pressure, aspiration, pneumonia, infection, trauma to the teeth and death.    After a procedural time-out, the patient was given 5 mg versed and 50 mcg fentanyl for moderate sedation.  The patient's heart rate, blood pressure, and oxygen saturation are monitored continuously during the procedure.The oropharynx was anesthetized 10 cc of topical 1% viscous lidocaine and 2 cetacaine sprays.  The transesophageal probe was inserted in the esophagus and stomach without difficulty and multiple views were obtained.  The patient was kept under observation until the patient left the procedure room.  The period of conscious sedation is 15 minutes, of which I was present face-to-face 100% of this time. The patient left the procedure room in stable condition.   Agitated microbubble saline contrast was administered.  COMPLICATIONS:    There were no immediate complications.  Findings:  1. LEFT VENTRICLE: The left ventricular wall thickness is moderately increased.  The left ventricular cavity is normal in size. Wall motion is normal.  LVEF is 60-65%.  2. RIGHT VENTRICLE:  The right ventricle is normal in structure and function without any thrombus or masses.    3. LEFT ATRIUM:  The left atrium is mildly dilated in size without any thrombus or masses.  There is not spontaneous echo contrast ("smoke") in the left atrium consistent with a low flow state.  4. LEFT ATRIAL APPENDAGE:  The left atrial appendage is free of any thrombus or masses. The appendage has single lobes. Pulse doppler indicates  moderate flow in the appendage.  5. ATRIAL SEPTUM:  The atrial septum appears intact and is free of thrombus and/or masses.  There is no evidence for interatrial shunting by color doppler and saline microbubble.  6. RIGHT ATRIUM:  The right atrium is normal in size and function without any thrombus or masses.  7. MITRAL VALVE:  The mitral valve is normal in structure and function with trivial regurgitation.  There were no vegetations or stenosis.  8. AORTIC VALVE:  The aortic valve is trileaflet, normal in structure and function with no regurgitation.  There were no vegetations or stenosis  9. TRICUSPID VALVE:  The tricuspid valve is normal in structure and function with trivial regurgitation.  There were no vegetations or stenosis  10.  PULMONIC VALVE:  The pulmonic valve is normal in structure and function with no regurgitation.  There were no vegetations or stenosis.   11. AORTIC ARCH, ASCENDING AND DESCENDING AORTA:  There was grade 1 Myrtis Ser(Katz et. Al, 1992) atherosclerosis of the ascending aorta, aortic arch, or proximal descending aorta.  12. PULMONARY VEINS: Anomalous pulmonary venous return was not noted.  13. PERICARDIUM: The pericardium appeared normal and non-thickened.  There is no pericardial effusion.  IMPRESSION:   1. No LAA thrombus 2. Negative for PFO by color doppler and saline microbubble contrast 3. Small amount of aortic atherosclerosis 4. Moderate LVH 5. LVEF 60-65%  RECOMMENDATIONS:    1. No evidence for cardiac cause of stroke. 2. Signs of hypertensive cardiomyopathy - recommend improved blood pressure control. 3. Consider implanted loop recorder.  Time Spent Directly with the Patient:  30 minutes   Chrystie NoseKenneth C. Lashawndra Lampkins, MD, [facility]ACC  [facility]  Attending Cardiologist  Direct Dial: [phone]  Fax: [phone]  Website:  [email or web address]  Lisette AbuKenneth C Angellee Cohill 06/27/2017, 2:28 PM

## 2017-06-28 ENCOUNTER — Encounter (HOSPITAL_COMMUNITY): Payer: Self-pay | Admitting: Cardiology

## 2017-07-02 ENCOUNTER — Other Ambulatory Visit: Payer: Self-pay

## 2017-07-02 NOTE — Patient Outreach (Signed)
[facility]) Care Management  07/02/2017  Tammy GaskinsMela Shelton Feb 23, 1966 [id number]     EMMI-STROKE RED ON EMMI ALERT Day # 1 Date: 06/29/17 Red Alert Reason: "Scheduled follow up appt? No"     Outreach attempt #1 to patient. No answer. RN CM left HIPAA compliant message along with contact info.          Plan: RN CM will make outreach attempt to patient within one business day if no return call from patient.    Antionette Fairyoshanda Iyah Laguna, RN,BSN,CCM [facility] Care Management Telephonic Care Management Coordinator Direct Phone: [phone] Toll Free: [phone] Fax: [phone]

## 2017-07-02 NOTE — Patient Outreach (Signed)
[facility]) Care Management  07/02/2017  Tammy GaskinsMela Bauer [birth year] [id number]   EMMI-STROKE RED ON EMMI ALERT Day # 1 Date: 06/29/17 Red Alert Reason: "Scheduled follow up appt? No"   Incoming call form patient returning RN CM's call. Patient states she has been doing good since discharge home. She voices that she still has some double vision but has been told that it would probably persist for some time and advised to f/u with eye MD.  Reviewed and addressed red alert with patient. She states that Dr. Marlis EdelsonSethi's office called to schedule f/u appt and patient was advised that she had $200 co-pay due to being uninsured. She made appt but does not think she will be able to keep appt and make co-pay. She voices that she is in the process of getting some insurance. She has 2wk [name].Patient has not yet made appt but voiced understanding of doing so promptly and will call today. She states that this will take the place of her going to see PCP as med center takes uninsured patient. Advised patient to alert staff of her co pay amt for neurology and to inquire if any assistance or alternative options available to assist her. She will f/u with office. She voices that she has all her meds and is adhering to med regimen. No issues or concerns regarding meds or transportation. Reviewed with patient s/s of stroke and when to seek medical attention. She voiced understanding. She is agreeable to RN CM mailing her further stroke educational info to review. Advised patient that they would continue to get automated EMMI-Stroke post discharge calls to assess how they are doing following recent hospitalization and will receive a call from a nurse if any of their responses were abnormal. Patient voiced understanding and was appreciative of f/u call.   Plan: RN CM will notify [facility] administrative assistant of case status. RN CM will send EMMI-Stroke educational info to  patient.   [name], RN,BSN,CCM [facility] Care Management Telephonic Care Management Coordinator Direct Phone: [phone] Toll Free: [phone] Fax: [phone]

## 2017-07-02 NOTE — Patient Outreach (Signed)
[facility]) Care Management  07/02/2017  Lucia GaskinsMela Shelton [birth year] [id number]   EMMI-STROKE RED ON EMMI ALERT Day # 1 Date: 06/29/17 Red Alert Reason: "Scheduled follow up appt? No"    Voicemail message received from patient. Return call placed to patient. No answer. RN CM left HIPAA compliant voicemail message along with contact info.     Plan: RN CM will make outreach attempt to patient within one business day if no return call from patient.   Antionette Fairyoshanda Tait Balistreri, RN,BSN,CCM [facility] Care Management Telephonic Care Management Coordinator Direct Phone: [phone] Toll Free: [phone] Fax: [phone]

## 2017-07-03 ENCOUNTER — Other Ambulatory Visit: Payer: Self-pay

## 2017-07-03 ENCOUNTER — Ambulatory Visit: Payer: Self-pay

## 2017-07-03 NOTE — Patient Outreach (Signed)
[facility]) Care Management  07/02/17  Tammy GaskinsMela Bauer 12/15/1965 [id number]   Care Coordination   Voicemail message received from Elease HashimotoPatricia [facility] requesting call back. Return call placed to nurse. Spoke with nurse regarding patient's case. Patient had contacted center as advised on discharge instructions. Patient was under the impression that this was an MD office that takes uninsured patients. Advised that this is not a MD office as they di not have MD of staff they have nurses who assist with referring patient to community resources and MD practices. RN CM was provided with alternative options for patient to make MD appt at [facility] [place], [facility] [place] and [facility] [place]. Advised that RN CM would f/u with patient to make sure she is aware of options and contact agencies to make an appt.   Outreach attempt to patient. No answer at present. RN CM left HIPAA compliant voicemail message along with contact info for patient.     Plan: RN CM will await return call from patient and convey info to patient.   Antionette Fairyoshanda Carliyah Cotterman, RN,BSN,CCM [facility] Care Management Telephonic Care Management Coordinator Direct Phone: [phone] Toll Free: [phone] Fax: [phone]

## 2017-07-03 NOTE — Patient Outreach (Signed)
[facility]) Care Management  07/03/2017  Tammy GaskinsMela Bauer 04-30-1966 [id number]   Care Coordination   Incoming call from patient reutning RN CM 's call. Reviewed and discussed info RN CM receoved during phone call yesterday from [facility]. Patient confimred that she had been given  the same info from staff. Discussed options with patient in regards to low cost MD appts/co pays until she gets insurance. RN CM provided patient with the following resources to consider: -[facility] [place] -[facility] -[facility] [place] -[facility] -[facility]   Patient states that she prefers to contact the agencies first and use clinic and health dept as last resort. She will contact them today to make an appt.   Plan: RN CM advised patient to call for any further RN CM needs or concerns.    Antionette Fairyoshanda Jahni Nazar, RN,BSN,CCM [facility] Care Management Telephonic Care Management Coordinator Direct Phone: [phone] Toll Free: [phone] Fax: [phone]

## 2017-07-05 ENCOUNTER — Other Ambulatory Visit: Payer: Self-pay

## 2017-07-05 NOTE — Patient Outreach (Signed)
[facility]) Care Management  07/05/2017  Lucia GaskinsMela Shelton Jan 23, 1966 [id number]       EMMI-STROKE RED ON EMMI ALERT Day # 6 Date: 07/05/17 Red Alert Reason: "Smoked or been around smoke? Yes"    Outreach attempt # 1 to patient. Spoke with patient. Patient pleased to report that she was able to use the info provided by RN CM and find a PCP. She has an appt on 07/18/17. No issues with transportation to appts. Reviewed and addressed red alert with patient. She states that she continues to smoke but has made a conscious effort to try to quit. She voices that she want to "do it on my own." She is pleased to report that she was smoking a pack of cigarettes per day but is now down to smoking only three cigarettes a day. RN CM praised and encouraged patient to continue with this. Provided aptietn with 1-800-QUIT-NOW as a resource/support to assist her. Dicussed the overall health benefits of quitting and/or decreasing smoking. Patient states she has been having to "run to the bathroom" and having upset stomach when taking Metformin. RN CM inquire d with patient how she is taking med. She states that she is not taking it with meals. Encouraged patient to take med with meals to see if symptoms improve and if not to alert MD of issue. She also states she is trying to adhere to diabetic diet and making healthier food choices. RN CM provided diet education info to patient and patient agreeable to RN CM mailing out some diet info for her to review. No further RN CM needs or concerns at this time. Advised patient that they would continue to get automated EMMI-Stroke post discharge calls to assess how they are doing following recent hospitalization and will receive a call from a nurse if any of their responses were abnormal. Patient voiced understanding and was appreciative of f/u call.       Plan: RN CM will make notify [facility] administrative assistant of case status. RN CM will send [facility]  educational diet info to patient via mail.   Antionette Fairyoshanda Wyat Infinger, RN,BSN,CCM [facility] Care Management Telephonic Care Management Coordinator Direct Phone: [phone] Toll Free: [phone] Fax: [phone]

## 2017-07-10 ENCOUNTER — Telehealth: Payer: Self-pay | Admitting: Neurology

## 2017-07-10 ENCOUNTER — Ambulatory Visit (INDEPENDENT_AMBULATORY_CARE_PROVIDER_SITE_OTHER): Payer: Self-pay | Admitting: *Deleted

## 2017-07-10 DIAGNOSIS — I639 Cerebral infarction, unspecified: Secondary | ICD-10-CM

## 2017-07-10 LAB — CUP PACEART INCLINIC DEVICE CHECK
Date Time Interrogation Session: 20180807104626
MDC IDC PG IMPLANT DT: 20180725

## 2017-07-10 NOTE — Telephone Encounter (Signed)
Pt was called back and has agreed to appointment on 8-27 for 12, pt has been advised to check in at 11:30 on 07-30-2017

## 2017-07-10 NOTE — Telephone Encounter (Signed)
Pt called back re: rescheduling her appointment for 9-11, there is nothing showing (re: a New Pt appointment ) between the requested dates of 8-26 to 8-31, please call pt if there is a specific  Date and time pt should be seen on.

## 2017-07-10 NOTE — Progress Notes (Signed)
Wound check appointment. Steri-strips removed. Wound without redness or edema. Incision edges approximated, wound well healed. Battery status: Good. R-waves 0.90 mV. 0 symptom episodes, 0 tachy episodes, 0 pause episodes, 0 brady episodes. 0 AF episodes (0% burden). Patient educated on wound care and Carelink monitor. Monthly summary reports and ROV with WC PRN.

## 2017-07-12 ENCOUNTER — Other Ambulatory Visit: Payer: Self-pay

## 2017-07-12 NOTE — Patient Outreach (Signed)
[facility] Care Management  07/12/2017  Lucia GaskinsMela Shelton 1966/01/22 [id number]     EMMI-STROKE RED ON EMMI ALERT Day # 13 Date: 07/11/17 Red Alert Reason: "Went to follow-up appt? No"    Red alert received and no outreach to patient warranted at this time. RN CM has assisted patient with scheduling f/u appt. Patient has MD f/u appt on 07/18/17. No further RN CM interventions needed at this time.       Plan: RN CM will notify [facility] administrative assistant of case status.    Antionette Fairyoshanda Clevland Cork, RN,BSN,CCM [facility] Care Management Telephonic Care Management Coordinator Direct Phone: [phone] Toll Free: [phone] Fax: [phone]

## 2017-07-18 ENCOUNTER — Ambulatory Visit (INDEPENDENT_AMBULATORY_CARE_PROVIDER_SITE_OTHER): Payer: Self-pay | Admitting: Physician Assistant

## 2017-07-18 ENCOUNTER — Encounter (INDEPENDENT_AMBULATORY_CARE_PROVIDER_SITE_OTHER): Payer: Self-pay | Admitting: Physician Assistant

## 2017-07-18 VITALS — BP 161/96 | HR 90 | Temp 98.0°F | Wt 143.0 lb

## 2017-07-18 DIAGNOSIS — E119 Type 2 diabetes mellitus without complications: Secondary | ICD-10-CM

## 2017-07-18 DIAGNOSIS — I10 Essential (primary) hypertension: Secondary | ICD-10-CM

## 2017-07-18 DIAGNOSIS — I639 Cerebral infarction, unspecified: Secondary | ICD-10-CM

## 2017-07-18 MED ORDER — AMLODIPINE BESYLATE 10 MG PO TABS: 10.0000 mg | ORAL_TABLET | Freq: Every day | ORAL | 5 refills | Status: DC

## 2017-07-18 MED ORDER — GLUCOSE BLOOD VI STRP: ORAL_STRIP | 12 refills | Status: DC

## 2017-07-18 MED ORDER — INSULIN GLARGINE 100 UNIT/ML ~~LOC~~ SOLN: 20.0000 [IU] | Freq: Every day | SUBCUTANEOUS | 11 refills | Status: DC

## 2017-07-18 MED ORDER — ATORVASTATIN CALCIUM 40 MG PO TABS: 40.0000 mg | ORAL_TABLET | Freq: Every day | ORAL | 5 refills | Status: DC

## 2017-07-18 MED ORDER — ASPIRIN 325 MG PO TABS: 325.0000 mg | ORAL_TABLET | Freq: Every day | ORAL | 5 refills | Status: DC

## 2017-07-18 MED ORDER — BLOOD GLUCOSE MONITOR KIT: PACK | 0 refills | Status: DC

## 2017-07-18 MED ORDER — "INSULIN SYRINGE 29G X 1/2"" 1 ML MISC": 1.0000 "application " | Freq: Every day | 5 refills | Status: DC

## 2017-07-18 MED ORDER — HYDRALAZINE HCL 10 MG PO TABS: 10.0000 mg | ORAL_TABLET | Freq: Three times a day (TID) | ORAL | 5 refills | Status: DC

## 2017-07-18 MED ORDER — METFORMIN HCL ER 500 MG PO TB24: 1000.0000 mg | ORAL_TABLET | Freq: Every day | ORAL | 5 refills | Status: DC

## 2017-07-18 MED ORDER — CARVEDILOL 6.25 MG PO TABS: 6.2500 mg | ORAL_TABLET | Freq: Two times a day (BID) | ORAL | 5 refills | Status: DC

## 2017-07-18 MED FILL — METFORMIN HCL ER 500 MG TAB: 500 | 30 days supply | Qty: 60 | Fill #0

## 2017-07-18 MED FILL — ?ATORVASTATIN 40MG TABLET: 40 | 30 days supply | Qty: 30 | Fill #0

## 2017-07-18 MED FILL — SURE COMFORT 1 ML SYRINGE: 31G X 5/16" | 30 days supply | Qty: 100 | Fill #0

## 2017-07-18 MED FILL — TRUE METRIX TEST STRIP: 30 days supply | Qty: 100 | Fill #0

## 2017-07-18 MED FILL — ?CARVEDILOL 6.25 MG TABLET: 6.25 | 30 days supply | Qty: 60 | Fill #0

## 2017-07-18 MED FILL — !LANTUS 100 UNITS/ML VIAL: 100 | 30 days supply | Qty: 10 | Fill #0

## 2017-07-18 MED FILL — AMLODIPINE BESYLATE 10 MG T: 10 | 30 days supply | Qty: 30 | Fill #0

## 2017-07-18 MED FILL — hydrALAZINE HCL 10 MG TABS: 10 | 30 days supply | Qty: 90 | Fill #0

## 2017-07-18 NOTE — Progress Notes (Signed)
Subjective:  Patient ID: Tammy Bauer, female    DOB: Jun 14, 1966  Age: 51 y.o. MRN: [id number]  CC: f/u hospital   HPI Tammy Bauer is a 51 y.o. female with a PMH of HTN presents on hospital f/u. Was admitted to hospital from 06/25/17 to 06/27/17. Diagnosed with CVA, hypertensive emergency, and tobacco abuse. Had insertion of loop recorder.  Found to have A1c of 11.1 in hospital. Patient did not know she was a diabetic. Discharged on anti-hypertensive and insulin glargine. Says she is feeling well and has not symptoms or complaints beside diarrhea induced by metformin. Would like to have loop recorder removed at some point.    Outpatient Medications Prior to Visit  Medication Sig Dispense Refill  . amLODipine (NORVASC) 10 MG tablet Take 1 tablet (10 mg total) by mouth daily. TAKE ONE TABLET BY MOUTH ONCE DAILY 30 tablet 5  . aspirin 325 MG tablet Take 1 tablet (325 mg total) by mouth daily. 30 tablet 5  . atorvastatin (LIPITOR) 40 MG tablet Take 1 tablet (40 mg total) by mouth at bedtime. Any generic statin from [employer] list is okay 30 tablet 5  . blood glucose meter kit and supplies KIT Dispense based on patient and insurance preference. Use up to four times daily as directed. (FOR ICD-9 250.00, 250.01). 1 each 0  . hydrALAZINE (APRESOLINE) 10 MG tablet Take 1 tablet (10 mg total) by mouth 3 (three) times daily. 90 tablet 5  . insulin glargine (LANTUS) 100 UNIT/ML injection Inject 0.05 mLs (5 Units total) into the skin daily. 10 mL 11  . acetaminophen (TYLENOL) 325 MG tablet Take 650 mg by mouth every 6 (six) hours as needed for mild pain or moderate pain.     . metFORMIN (GLUCOPHAGE) 1000 MG tablet Take 1 tablet (1,000 mg total) by mouth 2 (two) times daily with a meal. (Patient not taking: Reported on 07/18/2017) 60 tablet 5   No facility-administered medications prior to visit.      ROS Review of Systems  Constitutional: Negative for chills, fever and malaise/fatigue.  Eyes: Negative  for blurred vision.  Respiratory: Negative for shortness of breath.   Cardiovascular: Negative for chest pain and palpitations.  Gastrointestinal: Negative for abdominal pain and nausea.  Genitourinary: Negative for dysuria and hematuria.  Musculoskeletal: Negative for joint pain and myalgias.  Skin: Negative for rash.  Neurological: Negative for tingling and headaches.  Psychiatric/Behavioral: Negative for depression. The patient is not nervous/anxious.         Objective:  BP (!) 161/96 (BP Location: Left Arm, Patient Position: Sitting, Cuff Size: Normal)   Pulse 90   Temp 98 F (36.7 C) (Oral)   Wt 143 lb (64.9 kg)   LMP 07/16/2017 (Approximate)   SpO2 96%   BMI 26.16 kg/m   BP/Weight 07/18/2017 06/27/2017 08/27/2682  Systolic BP 419 622 -  Diastolic BP 96 82 -  Wt. (Lbs) 143 - 145  BMI 26.16 - 26.52      Physical Exam  Constitutional: She is oriented to person, place, and time.  Well developed, well nourished, NAD, polite  HENT:  Head: Normocephalic and atraumatic.  Eyes: No scleral icterus.  Neck: Normal range of motion. Neck supple. No thyromegaly present.  Cardiovascular: Normal rate, regular rhythm and normal heart sounds.   Pulmonary/Chest: Effort normal and breath sounds normal.  Musculoskeletal: She exhibits no edema.  Neurological: She is alert and oriented to person, place, and time. No cranial nerve deficit. Coordination normal.  Skin: Skin  is warm and dry. No rash noted. No erythema. No pallor.  Incision for loop recorder clean and dry without signs of infection  Psychiatric: She has a normal mood and affect. Her behavior is normal. Thought content normal.  Vitals reviewed.    Assessment & Plan:   1. Essential hypertension - Refill carvedilol (COREG) 6.25 MG tablet; Take 1 tablet (6.25 mg total) by mouth 2 (two) times daily with a meal.  Dispense: 60 tablet; Refill: 5 - Refill hydrALAZINE (APRESOLINE) 10 MG tablet; Take 1 tablet (10 mg total) by  mouth 3 (three) times daily.  Dispense: 90 tablet; Refill: 5 - Refill atorvastatin (LIPITOR) 40 MG tablet; Take 1 tablet (40 mg total) by mouth at bedtime. Any generic statin from [facility] list is okay  Dispense: 30 tablet; Refill: 5 - Refill amLODipine (NORVASC) 10 MG tablet; Take 1 tablet (10 mg total) by mouth daily. TAKE ONE TABLET BY MOUTH ONCE DAILY  Dispense: 30 tablet; Refill: 5 - CBC with Differential - Comprehensive metabolic panel  2. Acute CVA (cerebrovascular accident) ([place]) - Refill aspirin 325 MG tablet; Take 1 tablet (325 mg total) by mouth daily.  Dispense: 30 tablet; Refill: 5  3. Type 2 diabetes mellitus without complication, without long-term current use of insulin (HCC) - Begin metFORMIN (GLUCOPHAGE XR) 500 MG 24 hr tablet; Take 2 tablets (1,000 mg total) by mouth daily with breakfast.  Dispense: 60 tablet; Refill: 5 - Refill blood glucose meter kit and supplies KIT; Dispense based on patient and insurance preference. Use up to four times daily as directed. (FOR ICD-9 250.00, 250.01).  Dispense: 1 each; Refill: 0 - Increase insulin glargine (LANTUS) 100 UNIT/ML injection; Inject 0.2 mLs (20 Units total) into the skin daily.  Dispense: 10 mL; Refill: 11 - INSULIN SYRINGE 1CC/29G (B-D INSULIN SYRINGE) 29G X 1/2" 1 ML MISC; 1 application by Does not apply route at bedtime.  Dispense: 30 each; Refill: 5 - Refill glucose blood (RELION GLUCOSE TEST STRIPS) test strip; Use as instructed  Dispense: 100 each; Refill: 12   Meds ordered this encounter  Medications  . carvedilol (COREG) 6.25 MG tablet    Sig: Take 1 tablet (6.25 mg total) by mouth 2 (two) times daily with a meal.    Dispense:  60 tablet    Refill:  5    Order Specific Question:   Supervising Provider    Answer:   Tresa Garter [id number]  . metFORMIN (GLUCOPHAGE XR) 500 MG 24 hr tablet    Sig: Take 2 tablets (1,000 mg total) by mouth daily with breakfast.    Dispense:  60 tablet    Refill:  5    Order  Specific Question:   Supervising Provider    Answer:   Tresa Garter [id number]  . hydrALAZINE (APRESOLINE) 10 MG tablet    Sig: Take 1 tablet (10 mg total) by mouth 3 (three) times daily.    Dispense:  90 tablet    Refill:  5    Order Specific Question:   Supervising Provider    Answer:   Tresa Garter [id number]  . atorvastatin (LIPITOR) 40 MG tablet    Sig: Take 1 tablet (40 mg total) by mouth at bedtime. Any generic statin from [facility] list is okay    Dispense:  30 tablet    Refill:  5    Order Specific Question:   Supervising Provider    Answer:   Tresa Garter [id number]  . blood glucose meter kit  and supplies KIT    Sig: Dispense based on patient and insurance preference. Use up to four times daily as directed. (FOR ICD-9 250.00, 250.01).    Dispense:  1 each    Refill:  0    Order Specific Question:   Supervising Provider    Answer:   Tresa Garter [id number]    Order Specific Question:   Number of strips    Answer:   100    Order Specific Question:   Number of lancets    Answer:   100  . aspirin 325 MG tablet    Sig: Take 1 tablet (325 mg total) by mouth daily.    Dispense:  30 tablet    Refill:  5    Order Specific Question:   Supervising Provider    Answer:   Tresa Garter [id number]  . amLODipine (NORVASC) 10 MG tablet    Sig: Take 1 tablet (10 mg total) by mouth daily. TAKE ONE TABLET BY MOUTH ONCE DAILY    Dispense:  30 tablet    Refill:  5    Order Specific Question:   Supervising Provider    Answer:   Tresa Garter [id number]  . insulin glargine (LANTUS) 100 UNIT/ML injection    Sig: Inject 0.2 mLs (20 Units total) into the skin daily.    Dispense:  10 mL    Refill:  11    Order Specific Question:   Supervising Provider    Answer:   Tresa Garter [id number]  . INSULIN SYRINGE 1CC/29G (B-D INSULIN SYRINGE) 29G X 1/2" 1 ML MISC    Sig: 1 application by Does not apply route at bedtime.    Dispense:  30 each    Refill:   5    Order Specific Question:   Supervising Provider    Answer:   Tresa Garter [id number]  . glucose blood (RELION GLUCOSE TEST STRIPS) test strip    Sig: Use as instructed    Dispense:  100 each    Refill:  12    Order Specific Question:   Supervising Provider    Answer:   Tresa Garter [[id number]]    Follow-up: Return in about 4 weeks (around 08/15/2017) for Diabetes, bring glucose log.   Clent Demark PA

## 2017-07-18 NOTE — Patient Instructions (Signed)
Diabetes Mellitus and Food It is important for you to manage your blood sugar (glucose) level. Your blood glucose level can be greatly affected by what you eat. Eating healthier foods in the appropriate amounts throughout the day at about the same time each day will help you control your blood glucose level. It can also help slow or prevent worsening of your diabetes mellitus. Healthy eating may even help you improve the level of your blood pressure and reach or maintain a healthy weight. General recommendations for healthful eating and cooking habits include:  Eating meals and snacks regularly. Avoid going long periods of time without eating to lose weight.  Eating a diet that consists mainly of plant-based foods, such as fruits, vegetables, nuts, legumes, and whole grains.  Using low-heat cooking methods, such as baking, instead of high-heat cooking methods, such as deep frying.  Work with your dietitian to make sure you understand how to use the Nutrition Facts information on food labels. How can food affect me? Carbohydrates Carbohydrates affect your blood glucose level more than any other type of food. Your dietitian will help you determine how many carbohydrates to eat at each meal and teach you how to count carbohydrates. Counting carbohydrates is important to keep your blood glucose at a healthy level, especially if you are using insulin or taking certain medicines for diabetes mellitus. Alcohol Alcohol can cause sudden decreases in blood glucose (hypoglycemia), especially if you use insulin or take certain medicines for diabetes mellitus. Hypoglycemia can be a life-threatening condition. Symptoms of hypoglycemia (sleepiness, dizziness, and disorientation) are similar to symptoms of having too much alcohol. If your health care provider has given you approval to drink alcohol, do so in moderation and use the following guidelines:  Women should not have more than one drink per day, and men  should not have more than two drinks per day. One drink is equal to: ? 12 oz of beer. ? 5 oz of wine. ? 1 oz of hard liquor.  Do not drink on an empty stomach.  Keep yourself hydrated. Have water, diet soda, or unsweetened iced tea.  Regular soda, juice, and other mixers might contain a lot of carbohydrates and should be counted.  What foods are not recommended? As you make food choices, it is important to remember that all foods are not the same. Some foods have fewer nutrients per serving than other foods, even though they might have the same number of calories or carbohydrates. It is difficult to get your body what it needs when you eat foods with fewer nutrients. Examples of foods that you should avoid that are high in calories and carbohydrates but low in nutrients include:  Trans fats (most processed foods list trans fats on the Nutrition Facts label).  Regular soda.  Juice.  Candy.  Sweets, such as cake, pie, doughnuts, and cookies.  Fried foods.  What foods can I eat? Eat nutrient-rich foods, which will nourish your body and keep you healthy. The food you should eat also will depend on several factors, including:  The calories you need.  The medicines you take.  Your weight.  Your blood glucose level.  Your blood pressure level.  Your cholesterol level.  You should eat a variety of foods, including:  Protein. ? Lean cuts of meat. ? Proteins low in saturated fats, such as fish, egg whites, and beans. Avoid processed meats.  Fruits and vegetables. ? Fruits and vegetables that may help control blood glucose levels, such as apples,   mangoes, and yams.  Dairy products. ? Choose fat-free or low-fat dairy products, such as milk, yogurt, and cheese.  Grains, bread, pasta, and rice. ? Choose whole grain products, such as multigrain bread, whole oats, and brown rice. These foods may help control blood pressure.  Fats. ? Foods containing healthful fats, such as  nuts, avocado, olive oil, canola oil, and fish.  Does everyone with diabetes mellitus have the same meal plan? Because every person with diabetes mellitus is different, there is not one meal plan that works for everyone. It is very important that you meet with a dietitian who will help you create a meal plan that is just right for you. This information is not intended to replace advice given to you by your health care provider. Make sure you discuss any questions you have with your health care provider. Document Released: 08/17/2005 Document Revised: 04/27/2016 Document Reviewed: 10/17/2013 Elsevier Interactive Patient Education  2017 Elsevier Inc.  

## 2017-07-19 MED FILL — !TRUE METRIX BLOOD GLUCOSE: 1 days supply | Qty: 1 | Fill #0

## 2017-07-19 MED FILL — TRUEplus LANCETS 28G MISC: 30 days supply | Qty: 100 | Fill #0

## 2017-07-20 LAB — COMPREHENSIVE METABOLIC PANEL
ALK PHOS: 59 IU/L (ref 39–117)
ALT: 21 IU/L (ref 0–32)
AST: 21 IU/L (ref 0–40)
Albumin/Globulin Ratio: 1.7 (ref 1.2–2.2)
Albumin: 5 g/dL (ref 3.5–5.5)
BUN/Creatinine Ratio: 20 (ref 9–23)
BUN: 13 mg/dL (ref 6–24)
Bilirubin Total: 0.3 mg/dL (ref 0.0–1.2)
CALCIUM: 10.7 mg/dL — AB (ref 8.7–10.2)
CO2: 20 mmol/L (ref 20–29)
CREATININE: 0.66 mg/dL (ref 0.57–1.00)
Chloride: 101 mmol/L (ref 96–106)
GFR calc Af Amer: 120 mL/min/{1.73_m2} (ref >59)
GFR, EST NON AFRICAN AMERICAN: 104 mL/min/{1.73_m2} (ref >59)
Globulin, Total: 2.9 g/dL (ref 1.5–4.5)
Glucose: 84 mg/dL (ref 65–99)
Potassium: 4 mmol/L (ref 3.5–5.2)
Sodium: 139 mmol/L (ref 134–144)
Total Protein: 7.9 g/dL (ref 6.0–8.5)

## 2017-07-20 LAB — CBC WITH DIFFERENTIAL/PLATELET
BASOS ABS: 0 10*3/uL (ref 0.0–0.2)
Basos: 0 %
EOS (ABSOLUTE): 0 10*3/uL (ref 0.0–0.4)
EOS: 1 %
Hematocrit: 48.7 % — ABNORMAL HIGH (ref 34.0–46.6)
Hemoglobin: 16.1 g/dL — ABNORMAL HIGH (ref 11.1–15.9)
IMMATURE GRANULOCYTES: 0 %
Immature Grans (Abs): 0 10*3/uL (ref 0.0–0.1)
Lymphocytes Absolute: 2 10*3/uL (ref 0.7–3.1)
Lymphs: 29 %
MCH: 32.9 pg (ref 26.6–33.0)
MCHC: 33.1 g/dL (ref 31.5–35.7)
MCV: 99 fL — ABNORMAL HIGH (ref 79–97)
MONOCYTES: 9 %
MONOS ABS: 0.6 10*3/uL (ref 0.1–0.9)
Neutrophils Absolute: 4.2 10*3/uL (ref 1.4–7.0)
Neutrophils: 61 %
PLATELETS: 335 10*3/uL (ref 150–379)
RBC: 4.9 x10E6/uL (ref 3.77–5.28)
RDW: 14.2 % (ref 12.3–15.4)
WBC: 6.9 10*3/uL (ref 3.4–10.8)

## 2017-07-27 ENCOUNTER — Ambulatory Visit (INDEPENDENT_AMBULATORY_CARE_PROVIDER_SITE_OTHER): Payer: Self-pay | Admitting: *Deleted

## 2017-07-27 DIAGNOSIS — I639 Cerebral infarction, unspecified: Secondary | ICD-10-CM

## 2017-07-30 ENCOUNTER — Ambulatory Visit (INDEPENDENT_AMBULATORY_CARE_PROVIDER_SITE_OTHER): Payer: Self-pay | Admitting: Neurology

## 2017-07-30 ENCOUNTER — Encounter: Payer: Self-pay | Admitting: Neurology

## 2017-07-30 VITALS — BP 137/84 | HR 78 | Ht 62.0 in | Wt 145.0 lb

## 2017-07-30 DIAGNOSIS — I639 Cerebral infarction, unspecified: Secondary | ICD-10-CM

## 2017-07-30 NOTE — Progress Notes (Signed)
[facility] [address] [place]. [place] [address] [phone]       OFFICE FOLLOW-UP NOTE  [name] Date of Birth:  [birth year] Medical Record Number:  [id number]   HPI: [name] is a pleasant 51 year african american lady seen today for  first office f/u visit following [facility] admission for recent stroke. History is obtained from patient and personal review of electronic medical records.Tammy Sheltonis a 51 y.o.femalewith a history of hypertension, medication non-compliance, and tobacco abuse who presented to the emergency room at [facility] with complaints of double vision that started about a week ago but can't really worse on Sunday. Next line should reported seeing double from both eyes, images side-to-side with no improvement if she covered one eye.  Her blood pressure on arrival systolic was 903E. She was admitted for evaluation of possible posterior reversible encephalopathy syndrome versus stroke.MRI imaging was obtained which I personally reviewed revealed evidence of Acute/subacute nonhemorrhagic infarct involving the superior left temporal lobe, posterior insular cortex, and left frontal operculum  She was transferred to [facility] because of unavailability of telemetry beds at [facility].  Denies any current headaches. Reports occasional headaches. Next line denies any nausea or vomiting. Next line to managing fevers chills next line denies any weight gain or weight loss.  Patient does not report any easy bruisability or bleeding. Denies any clotting disorders. Denies any history of miscarriages.  Pt is a current smoker and newly-diagnosed diabetic with an a1C of 11.1.LKW: At least 7 days ago.Premorbid modified Rankin scale (mRS): 0.NIHSS: 0-Completely asymptomatic and back to baseline post-stroke.Transthoraxic  Echo showed normal ejection.TEE was normal. Loop recorder was placed. fraction and carotid dopplers showed no significant extracranial  stenosis.LDL cholestrol was elevated at 112 mg% and HbA1c at 11.1.She was advised to stop smoking and started on aspirin and lipitor. She states she is doing well her blood pressure is well controlled and and today it is 137/89. She continues to pull smoker but has cutback to half pack per day. She is tolerating aspirin without bruising or bleeding and Lipitor without any muscle aches. Her fasting sugars are doing well and range below 100. Loop recorder  has so far not detected any paroxysmal atrial fibrillation.she has no complaints today. She is back to her baseline doing everything she did prior to her stroke.  ROS:   14 system review of systems is positive for  double vision only and all other systems negative  PMH:  Past Medical History:  Diagnosis Date  . Diabetes mellitus without complication ([place])   . Hypertension   . Stroke [facility])     Social History:  Social History   Social History  . Marital status: Married    Spouse name: N/A  . Number of children: N/A  . Years of education: N/A   Occupational History  . Not on file.   Social History Main Topics  . Smoking status: Current Every Day Smoker    Packs/day: 0.25    Types: Cigarettes    Start date: 08/29/1999  . Smokeless tobacco: Never Used     Comment: smoke 5 cigarettes a day  . Alcohol use No  . Drug use: No  . Sexual activity: Not on file   Other Topics Concern  . Not on file   Social History Narrative  . No narrative on file    Medications:   Current Outpatient Prescriptions on File Prior to Visit  Medication Sig Dispense Refill  . acetaminophen (  TYLENOL) 325 MG tablet Take 650 mg by mouth every 6 (six) hours as needed for mild pain or moderate pain.     Marland Kitchen amLODipine (NORVASC) 10 MG tablet Take 1 tablet (10 mg total) by mouth daily. TAKE ONE TABLET BY MOUTH ONCE DAILY 30 tablet 5  . aspirin 325 MG tablet Take 1 tablet (325 mg total) by mouth daily. 30 tablet 5  . atorvastatin (LIPITOR) 40 MG tablet Take 1  tablet (40 mg total) by mouth at bedtime. Any generic statin from [facility] list is okay 30 tablet 5  . blood glucose meter kit and supplies KIT Dispense based on patient and insurance preference. Use up to four times daily as directed. (FOR ICD-9 250.00, 250.01). 1 each 0  . carvedilol (COREG) 6.25 MG tablet Take 1 tablet (6.25 mg total) by mouth 2 (two) times daily with a meal. 60 tablet 5  . glucose blood (RELION GLUCOSE TEST STRIPS) test strip Use as instructed 100 each 12  . hydrALAZINE (APRESOLINE) 10 MG tablet Take 1 tablet (10 mg total) by mouth 3 (three) times daily. 90 tablet 5  . insulin glargine (LANTUS) 100 UNIT/ML injection Inject 0.2 mLs (20 Units total) into the skin daily. 10 mL 11  . INSULIN SYRINGE 1CC/29G (B-D INSULIN SYRINGE) 29G X 1/2" 1 ML MISC 1 application by Does not apply route at bedtime. 30 each 5  . metFORMIN (GLUCOPHAGE XR) 500 MG 24 hr tablet Take 2 tablets (1,000 mg total) by mouth daily with breakfast. 60 tablet 5   No current facility-administered medications on file prior to visit.     Allergies:   Allergies  Allergen Reactions  . Ace Inhibitors Cough    Physical Exam General: well developed, well nourished middle aged lady not in distress, seated, in no evident distress Head: head normocephalic and atraumatic.  Neck: supple with no carotid or supraclavicular bruits Cardiovascular: regular rate and rhythm, no murmurs Musculoskeletal: no deformity Skin:  no rash/petichiae Vascular:  Normal pulses all extremities Vitals:   07/30/17 1158  BP: 137/84  Pulse: 78   Neurologic Exam Mental Status: Awake and fully alert. Oriented to place and time. Recent and remote memory intact. Attention span, concentration and fund of knowledge appropriate. Mood and affect appropriate.  Cranial Nerves: Fundoscopic exam reveals sharp disc margins. Pupils equal, briskly reactive to light. Extraocular movements full without nystagmus. Visual fields full to confrontation.  Hearing intact. Facial sensation intact. Face, tongue, palate moves normally and symmetrically.  Motor: Normal bulk and tone. Normal strength in all tested extremity muscles. Sensory.: intact to touch ,pinprick .position and vibratory sensation.  Coordination: Rapid alternating movements normal in all extremities. Finger-to-nose and heel-to-shin performed accurately bilaterally. Gait and Station: Arises from chair without difficulty. Stance is normal. Gait demonstrates normal stride length and balance . Able to heel, toe and tandem walk without difficulty.  Reflexes: 1+ and symmetric. Toes downgoing.   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  I also advised the patient to eat a healthy diet with plenty of whole grains, cereals, fruits and vegetables, exercise regularly and maintain ideal body weight . I have counseled the patient to quit smoking completely and she agrees. The patient is interested in participating in particpating in [place] stroke patient registry.Followup in the future with my nurse practitioner in 6 months or call earlier if necessary Greater than 50% of time during this 25 minute visit was spent on counseling,explanation of diagnosis, planning of further management, discussion with patient and family and coordination of care Antony Contras, MD  [facility] [address] [place],  [address]  Phone [phone] Fax [phone] Note: This document was prepared with digital dictation and possible smart phrase technology. Any transcriptional errors that result from this process are unintentional

## 2017-07-30 NOTE — Patient Instructions (Signed)
I had a long d/w patient about her  recent  Cryptogenic stroke, risk for recurrent stroke/TIAs, personally independently reviewed imaging studies and stroke evaluation results and answered questions.Continue aspirin 325 mg daily  for secondary stroke prevention and maintain strict control of hypertension with blood pressure goal below 130/90, diabetes with hemoglobin A1c goal below 6.5% and lipids with LDL cholesterol goal below 70 mg/dL. I also advised the patient to eat a healthy diet with plenty of whole grains, cereals, fruits and vegetables, exercise regularly and maintain ideal body weight . I have counseled the patient to quit smoking completely and she agrees. The patient is interested in participating in particpating in [facility] stroke patient with history Followup in the future with my nurse practitioner in 6 months or call earlier if necessary Stroke Prevention Some medical conditions and behaviors are associated with an increased chance of having a stroke. You may prevent a stroke by making healthy choices and managing medical conditions. How can I reduce my risk of having a stroke?  Stay physically active. Get at least 30 minutes of activity on most or all days.  Do not smoke. It may also be helpful to avoid exposure to secondhand smoke.  Limit alcohol use. Moderate alcohol use is considered to be: ? No more than 2 drinks per day for men. ? No more than 1 drink per day for nonpregnant women.  Eat healthy foods. This involves: ? Eating 5 or more servings of fruits and vegetables a day. ? Making dietary changes that address high blood pressure (hypertension), high cholesterol, diabetes, or obesity.  Manage your cholesterol levels. ? Making food choices that are high in fiber and low in saturated fat, trans fat, and cholesterol may control cholesterol levels. ? Take any prescribed medicines to control cholesterol as directed by your health care provider.  Manage your  diabetes. ? Controlling your carbohydrate and sugar intake is recommended to manage diabetes. ? Take any prescribed medicines to control diabetes as directed by your health care provider.  Control your hypertension. ? Making food choices that are low in salt (sodium), saturated fat, trans fat, and cholesterol is recommended to manage hypertension. ? Ask your health care provider if you need treatment to lower your blood pressure. Take any prescribed medicines to control hypertension as directed by your health care provider. ? If you are 17-2 years of age, have your blood pressure checked every 3-5 years. If you are 78 years of age or older, have your blood pressure checked every year.  Maintain a healthy weight. ? Reducing calorie intake and making food choices that are low in sodium, saturated fat, trans fat, and cholesterol are recommended to manage weight.  Stop drug abuse.  Avoid taking birth control pills. ? Talk to your health care provider about the risks of taking birth control pills if you are over 37 years old, smoke, get migraines, or have ever had a blood clot.  Get evaluated for sleep disorders (sleep apnea). ? Talk to your health care provider about getting a sleep evaluation if you snore a lot or have excessive sleepiness.  Take medicines only as directed by your health care provider. ? For some people, aspirin or blood thinners (anticoagulants) are helpful in reducing the risk of forming abnormal blood clots that can lead to stroke. If you have the irregular heart rhythm of atrial fibrillation, you should be on a blood thinner unless there is a good reason you cannot take them. ? Understand all your medicine  instructions.  Make sure that other conditions (such as anemia or atherosclerosis) are addressed. Get help right away if:  You have sudden weakness or numbness of the face, arm, or leg, especially on one side of the body.  Your face or eyelid droops to one  side.  You have sudden confusion.  You have trouble speaking (aphasia) or understanding.  You have sudden trouble seeing in one or both eyes.  You have sudden trouble walking.  You have dizziness.  You have a loss of balance or coordination.  You have a sudden, severe headache with no known cause.  You have new chest pain or an irregular heartbeat. Any of these symptoms may represent a serious problem that is an emergency. Do not wait to see if the symptoms will go away. Get medical help at once. Call your local emergency services (911 in [place] Do not drive yourself to the hospital. This information is not intended to replace advice given to you by your health care provider. Make sure you discuss any questions you have with your health care provider. Document Released: 12/28/2004 Document Revised: 04/27/2016 Document Reviewed: 05/23/2013 Elsevier Interactive Patient Education  2017 [employer].

## 2017-08-03 NOTE — Progress Notes (Signed)
[facility] Summary Report / Loop Recorder 

## 2017-08-14 ENCOUNTER — Ambulatory Visit: Payer: Self-pay | Admitting: Neurology

## 2017-08-16 MED FILL — ?ATORVASTATIN 40MG TABLET: 40 | 30 days supply | Qty: 30 | Fill #1

## 2017-08-16 MED FILL — ?CARVEDILOL 6.25 MG TABLET: 6.25 | 30 days supply | Qty: 60 | Fill #1

## 2017-08-16 MED FILL — AMLODIPINE BESYLATE 10 MG T: 10 | 30 days supply | Qty: 30 | Fill #1

## 2017-08-20 ENCOUNTER — Ambulatory Visit (INDEPENDENT_AMBULATORY_CARE_PROVIDER_SITE_OTHER): Payer: Self-pay | Admitting: Physician Assistant

## 2017-08-20 ENCOUNTER — Encounter (INDEPENDENT_AMBULATORY_CARE_PROVIDER_SITE_OTHER): Payer: Self-pay | Admitting: Physician Assistant

## 2017-08-20 VITALS — BP 146/85 | HR 70 | Temp 98.2°F | Wt 142.4 lb

## 2017-08-20 DIAGNOSIS — Z794 Long term (current) use of insulin: Secondary | ICD-10-CM

## 2017-08-20 DIAGNOSIS — E119 Type 2 diabetes mellitus without complications: Secondary | ICD-10-CM

## 2017-08-20 DIAGNOSIS — I10 Essential (primary) hypertension: Secondary | ICD-10-CM

## 2017-08-20 DIAGNOSIS — H532 Diplopia: Secondary | ICD-10-CM

## 2017-08-20 MED ORDER — LOSARTAN POTASSIUM 50 MG PO TABS: 50.0000 mg | ORAL_TABLET | Freq: Every day | ORAL | 5 refills | Status: DC

## 2017-08-20 MED FILL — METFORMIN HCL ER 500 MG TAB: 500 | 30 days supply | Qty: 60 | Fill #1

## 2017-08-20 MED FILL — hydrALAZINE HCL 10 MG TABS: 10 | 30 days supply | Qty: 90 | Fill #1

## 2017-08-20 MED FILL — LOSARTAN POTASSIUM 50 MG TA: 50 | 30 days supply | Qty: 30 | Fill #0

## 2017-08-20 NOTE — Patient Instructions (Signed)
Losartan tablets What is this medicine? LOSARTAN (loe SAR tan) is used to treat high blood pressure and to reduce the risk of stroke in certain patients. This drug also slows the progression of kidney disease in patients with diabetes. This medicine may be used for other purposes; ask your health care provider or pharmacist if you have questions. COMMON BRAND NAME(S): Cozaar What should I tell my health care provider before I take this medicine? They need to know if you have any of these conditions: -heart failure -kidney or liver disease -an unusual or allergic reaction to losartan, other medicines, foods, dyes, or preservatives -pregnant or trying to get pregnant -breast-feeding How should I use this medicine? Take this medicine by mouth with a glass of water. Follow the directions on the prescription label. This medicine can be taken with or without food. Take your doses at regular intervals. Do not take your medicine more often than directed. Talk to your pediatrician regarding the use of this medicine in children. Special care may be needed. Overdosage: If you think you have taken too much of this medicine contact a poison control center or emergency room at once. NOTE: This medicine is only for you. Do not share this medicine with others. What if I miss a dose? If you miss a dose, take it as soon as you can. If it is almost time for your next dose, take only that dose. Do not take double or extra doses. What may interact with this medicine? -blood pressure medicines -diuretics, especially triamterene, spironolactone, or amiloride -fluconazole -NSAIDs, medicines for pain and inflammation, like ibuprofen or naproxen -potassium salts or potassium supplements -rifampin This list may not describe all possible interactions. Give your health care provider a list of all the medicines, herbs, non-prescription drugs, or dietary supplements you use. Also tell them if you smoke, drink alcohol, or  use illegal drugs. Some items may interact with your medicine. What should I watch for while using this medicine? Visit your doctor or health care professional for regular checks on your progress. Check your blood pressure as directed. Ask your doctor or health care professional what your blood pressure should be and when you should contact him or her. Call your doctor or health care professional if you notice an irregular or fast heart beat. Women should inform their doctor if they wish to become pregnant or think they might be pregnant. There is a potential for serious side effects to an unborn child, particularly in the second or third trimester. Talk to your health care professional or pharmacist for more information. You may get drowsy or dizzy. Do not drive, use machinery, or do anything that needs mental alertness until you know how this drug affects you. Do not stand or sit up quickly, especially if you are an older patient. This reduces the risk of dizzy or fainting spells. Alcohol can make you more drowsy and dizzy. Avoid alcoholic drinks. Avoid salt substitutes unless you are told otherwise by your doctor or health care professional. Do not treat yourself for coughs, colds, or pain while you are taking this medicine without asking your doctor or health care professional for advice. Some ingredients may increase your blood pressure. What side effects may I notice from receiving this medicine? Side effects that you should report to your doctor or health care professional as soon as possible: -confusion, dizziness, light headedness or fainting spells -decreased amount of urine passed -difficulty breathing or swallowing, hoarseness, or tightening of the throat -fast or   irregular heart beat, palpitations, or chest pain -skin rash, itching -swelling of your face, lips, tongue, hands, or feet Side effects that usually do not require medical attention (report to your doctor or health care  professional if they continue or are bothersome): -cough -decreased sexual function or desire -headache -nasal congestion or stuffiness -nausea or stomach pain -sore or cramping muscles This list may not describe all possible side effects. Call your doctor for medical advice about side effects. You may report side effects to FDA at 1-800-FDA-1088. Where should I keep my medicine? Keep out of the reach of children. Store at room temperature between 15 and 30 degrees C (59 and 86 degrees F). Protect from light. Keep container tightly closed. Throw away any unused medicine after the expiration date. NOTE: This sheet is a summary. It may not cover all possible information. If you have questions about this medicine, talk to your doctor, pharmacist, or health care provider.  2018 Elsevier/Gold Standard (2008-01-31 16:42:18)  

## 2017-08-20 NOTE — Progress Notes (Signed)
Subjective:  Patient ID: Tammy Bauer, female    DOB: Nov 26, 1966  Age: 51 y.o. MRN: [id number]  CC: f/u DM  HPI Tammy Bauer is a 51 y.o. female with a medical history of HTN, Cryptogenic stroke, and DM2 presents on f/u of DM2, HTN, and stroke. She was seen by neurology on 07/30/17 and advised to continue on Aspirin 325 mg qday, control BP, maintain A1c below 6.5%, and keep LDL to below 70 mg/dL. Last A1c 11.1% on 06/25/17. Last BP 161/96 on 07/18/17. Currently taking carvedilol 6.25 mg BID, Hydralazine 10 mg TID, and Amlodipine 10 mg qday for BP control. BP is currently 154/87 and 146/85 on repeat. Reports BP is sometimes 130s/80s outside of clinic. Only symptom is of double vision since her stroke. She has been referred to the ophthalmologist but won't be seen until six months from her stroke. Does not endorse any other symptoms or complaints,    Patient is using 20 units of Lantus and Metformin 1000 mg XR as directed. Brings glucose log and low is 60, high is 200, regularly from 80-150. Has stopped using glimepiride as directed in the previous visit here.   Outpatient Medications Prior to Visit  Medication Sig Dispense Refill  . acetaminophen (TYLENOL) 325 MG tablet Take 650 mg by mouth every 6 (six) hours as needed for mild pain or moderate pain.     Marland Kitchen amLODipine (NORVASC) 10 MG tablet Take 1 tablet (10 mg total) by mouth daily. TAKE ONE TABLET BY MOUTH ONCE DAILY 30 tablet 5  . aspirin 325 MG tablet Take 1 tablet (325 mg total) by mouth daily. 30 tablet 5  . atorvastatin (LIPITOR) 40 MG tablet Take 1 tablet (40 mg total) by mouth at bedtime. Any generic statin from [facility] list is okay 30 tablet 5  . blood glucose meter kit and supplies KIT Dispense based on patient and insurance preference. Use up to four times daily as directed. (FOR ICD-9 250.00, 250.01). 1 each 0  . carvedilol (COREG) 6.25 MG tablet Take 1 tablet (6.25 mg total) by mouth 2 (two) times daily with a meal. 60 tablet 5  .  glimepiride (AMARYL) 2 MG tablet Take 2 mg by mouth daily with breakfast.    . glucose blood (RELION GLUCOSE TEST STRIPS) test strip Use as instructed 100 each 12  . hydrALAZINE (APRESOLINE) 10 MG tablet Take 1 tablet (10 mg total) by mouth 3 (three) times daily. 90 tablet 5  . insulin glargine (LANTUS) 100 UNIT/ML injection Inject 0.2 mLs (20 Units total) into the skin daily. 10 mL 11  . INSULIN SYRINGE 1CC/29G (B-D INSULIN SYRINGE) 29G X 1/2" 1 ML MISC 1 application by Does not apply route at bedtime. 30 each 5  . metFORMIN (GLUCOPHAGE XR) 500 MG 24 hr tablet Take 2 tablets (1,000 mg total) by mouth daily with breakfast. 60 tablet 5   No facility-administered medications prior to visit.      ROS Review of Systems  Constitutional: Negative for chills, fever and malaise/fatigue.  Eyes: Positive for double vision. Negative for blurred vision.  Respiratory: Negative for shortness of breath.   Cardiovascular: Negative for chest pain and palpitations.  Gastrointestinal: Negative for abdominal pain and nausea.  Genitourinary: Negative for dysuria and hematuria.  Musculoskeletal: Negative for joint pain and myalgias.  Skin: Negative for rash.  Neurological: Negative for tingling and headaches.  Psychiatric/Behavioral: Negative for depression. The patient is not nervous/anxious.     Objective:  BP (!) 154/87 (BP Location: Left  Arm, Patient Position: Sitting, Cuff Size: Normal)   Pulse 70   Temp 98.2 F (36.8 C) (Oral)   Wt 142 lb 6.4 oz (64.6 kg)   LMP 08/06/2017 (Approximate)   SpO2 97%   BMI 26.05 kg/m   BP/Weight 08/20/2017 07/30/2017 04/12/2584  Systolic BP 277 824 235  Diastolic BP 87 84 96  Wt. (Lbs) 142.4 145 143  BMI 26.05 26.52 26.16      Physical Exam  Constitutional: She is oriented to person, place, and time.  Well developed, well nourished, NAD, polite  HENT:  Head: Normocephalic and atraumatic.  Eyes: Conjunctivae and EOM are normal. No scleral icterus.  Neck:  Normal range of motion. Neck supple. No thyromegaly present.  Cardiovascular: Normal rate, regular rhythm and normal heart sounds.   No LE edema, no carotid bruit bilaterally.  Pulmonary/Chest: Effort normal and breath sounds normal.  Abdominal: Soft. Bowel sounds are normal. There is no tenderness.  Musculoskeletal: She exhibits no edema.  Neurological: She is alert and oriented to person, place, and time. No cranial nerve deficit. Coordination normal.  Skin: Skin is warm and dry. No rash noted. No erythema. No pallor.  Psychiatric: She has a normal mood and affect. Her behavior is normal. Thought content normal.  Vitals reviewed.    Assessment & Plan:    1. Hypertension, unspecified type - Comprehensive metabolic panel - Begin losartan (COZAAR) 50 MG tablet; Take 1 tablet (50 mg total) by mouth daily.  Dispense: 30 tablet; Refill: 5 - Continue Amlodipine, Hydralazine, and Carvedilol.  2. Type 2 diabetes mellitus without complication, with long-term current use of insulin ([place]) - Ambulatory referral to Ophthalmology - Microalbumin/Creatinine Ratio, Urine - Comprehensive metabolic panel  3. Diplopia - Ambulatory referral to Ophthalmology   Meds ordered this encounter  Medications  . losartan (COZAAR) 50 MG tablet    Sig: Take 1 tablet (50 mg total) by mouth daily.    Dispense:  30 tablet    Refill:  5    Order Specific Question:   Supervising Provider    Answer:   Tresa Garter [[id number]]    Follow-up: 6 weeks  Clent Demark PA

## 2017-08-21 ENCOUNTER — Other Ambulatory Visit: Payer: Self-pay | Admitting: *Deleted

## 2017-08-21 DIAGNOSIS — E119 Type 2 diabetes mellitus without complications: Secondary | ICD-10-CM

## 2017-08-21 MED ORDER — INSULIN GLARGINE 100 UNIT/ML ~~LOC~~ SOLN: 20.0000 [IU] | Freq: Every day | SUBCUTANEOUS | 3 refills | Status: DC

## 2017-08-21 NOTE — Telephone Encounter (Signed)
PRINTED FOR PASS PROGRAM 

## 2017-08-22 LAB — COMPREHENSIVE METABOLIC PANEL
ALBUMIN: 4.5 g/dL (ref 3.5–5.5)
ALT: 12 IU/L (ref 0–32)
AST: 15 IU/L (ref 0–40)
Albumin/Globulin Ratio: 1.6 (ref 1.2–2.2)
Alkaline Phosphatase: 54 IU/L (ref 39–117)
BUN / CREAT RATIO: 22 (ref 9–23)
BUN: 15 mg/dL (ref 6–24)
Bilirubin Total: 0.3 mg/dL (ref 0.0–1.2)
CALCIUM: 10.4 mg/dL — AB (ref 8.7–10.2)
CO2: 21 mmol/L (ref 20–29)
CREATININE: 0.68 mg/dL (ref 0.57–1.00)
Chloride: 105 mmol/L (ref 96–106)
GFR, EST AFRICAN AMERICAN: 119 mL/min/{1.73_m2} (ref >59)
GFR, EST NON AFRICAN AMERICAN: 103 mL/min/{1.73_m2} (ref >59)
GLOBULIN, TOTAL: 2.9 g/dL (ref 1.5–4.5)
GLUCOSE: 85 mg/dL (ref 65–99)
Potassium: 4 mmol/L (ref 3.5–5.2)
SODIUM: 139 mmol/L (ref 134–144)
TOTAL PROTEIN: 7.4 g/dL (ref 6.0–8.5)

## 2017-08-22 LAB — MICROALBUMIN / CREATININE URINE RATIO
CREATININE, UR: 192 mg/dL
Microalb/Creat Ratio: 32.1 mg/g creat — ABNORMAL HIGH (ref 0.0–30.0)
Microalbumin, Urine: 61.7 ug/mL

## 2017-08-24 ENCOUNTER — Telehealth (INDEPENDENT_AMBULATORY_CARE_PROVIDER_SITE_OTHER): Payer: Self-pay

## 2017-08-24 NOTE — Telephone Encounter (Signed)
Patient aware of mild proteinuria, explained to patient what that is. And that this can be reversed with control of her HTN and DM. Patient expressed understanding. Maryjean Morn, CMA

## 2017-08-24 NOTE — Telephone Encounter (Signed)
-----   Message from Loletta Specter, PA-C sent at 08/24/2017  8:36 AM EDT ----- Mild proteinuria. This may be reversed with control of hypertension and diabetes.

## 2017-08-27 ENCOUNTER — Ambulatory Visit (INDEPENDENT_AMBULATORY_CARE_PROVIDER_SITE_OTHER): Payer: Self-pay | Admitting: *Deleted

## 2017-08-27 DIAGNOSIS — I639 Cerebral infarction, unspecified: Secondary | ICD-10-CM

## 2017-08-28 LAB — CUP PACEART REMOTE DEVICE CHECK
Date Time Interrogation Session: 20180923221044
Implantable Pulse Generator Implant Date: 20180725

## 2017-08-28 NOTE — Progress Notes (Signed)
[facility] Summary Report / Loop Recorder 

## 2017-09-11 LAB — CUP PACEART REMOTE DEVICE CHECK
Date Time Interrogation Session: 20180825000019
MDC IDC PG IMPLANT DT: 20180725

## 2017-09-17 MED FILL — AMLODIPINE BESYLATE 10 MG T: 10 | 30 days supply | Qty: 30 | Fill #2

## 2017-09-17 MED FILL — ?CARVEDILOL 6.25 MG TABLET: 6.25 | 30 days supply | Qty: 60 | Fill #2

## 2017-09-25 ENCOUNTER — Ambulatory Visit (INDEPENDENT_AMBULATORY_CARE_PROVIDER_SITE_OTHER): Payer: Self-pay | Admitting: *Deleted

## 2017-09-25 DIAGNOSIS — I639 Cerebral infarction, unspecified: Secondary | ICD-10-CM

## 2017-09-26 NOTE — Progress Notes (Signed)
[facility] Summary Report / Loop Recorder 

## 2017-09-27 LAB — CUP PACEART REMOTE DEVICE CHECK
MDC IDC PG IMPLANT DT: 20180725
MDC IDC SESS DTM: 20181023224044

## 2017-09-28 ENCOUNTER — Telehealth (INDEPENDENT_AMBULATORY_CARE_PROVIDER_SITE_OTHER): Payer: Self-pay | Admitting: Physician Assistant

## 2017-09-28 NOTE — Telephone Encounter (Signed)
Spoke with patient, she asked if her loosing her grip was a sign of her having a stroke, explained to patient that I could not answer that, asked if she had any other signs or symptoms she said No. Instructed patient that if she kept having the felling of not being able to grip(weakness) to go to UC. Patient said she would. Maryjean Mornempestt S Amerie Beaumont, CMA

## 2017-09-28 NOTE — Telephone Encounter (Signed)
Patient wants to talk to you please, call her [phone]

## 2017-10-01 ENCOUNTER — Ambulatory Visit (INDEPENDENT_AMBULATORY_CARE_PROVIDER_SITE_OTHER): Payer: Self-pay | Admitting: Physician Assistant

## 2017-10-01 ENCOUNTER — Emergency Department (HOSPITAL_COMMUNITY): Payer: Medicaid Other

## 2017-10-01 ENCOUNTER — Encounter (HOSPITAL_COMMUNITY): Payer: Self-pay

## 2017-10-01 ENCOUNTER — Encounter (INDEPENDENT_AMBULATORY_CARE_PROVIDER_SITE_OTHER): Payer: Self-pay | Admitting: Physician Assistant

## 2017-10-01 ENCOUNTER — Inpatient Hospital Stay (HOSPITAL_COMMUNITY)
Admission: EM | Admit: 2017-10-01 | Discharge: 2017-10-02 | DRG: 066 | Disposition: A | Discharge status: Home / Self Care | Payer: Medicaid Other | Attending: Internal Medicine | Admitting: Internal Medicine

## 2017-10-01 ENCOUNTER — Telehealth (INDEPENDENT_AMBULATORY_CARE_PROVIDER_SITE_OTHER): Payer: Self-pay | Admitting: Physician Assistant

## 2017-10-01 VITALS — BP 178/98 | HR 78 | Temp 98.0°F | Wt 141.6 lb

## 2017-10-01 DIAGNOSIS — R29702 NIHSS score 2: Secondary | ICD-10-CM | POA: Diagnosis present

## 2017-10-01 DIAGNOSIS — F17210 Nicotine dependence, cigarettes, uncomplicated: Secondary | ICD-10-CM | POA: Diagnosis present

## 2017-10-01 DIAGNOSIS — R29898 Other symptoms and signs involving the musculoskeletal system: Secondary | ICD-10-CM

## 2017-10-01 DIAGNOSIS — Z794 Long term (current) use of insulin: Secondary | ICD-10-CM

## 2017-10-01 DIAGNOSIS — R29818 Other symptoms and signs involving the nervous system: Secondary | ICD-10-CM

## 2017-10-01 DIAGNOSIS — R402143 Coma scale, eyes open, spontaneous, at hospital admission: Secondary | ICD-10-CM | POA: Diagnosis present

## 2017-10-01 DIAGNOSIS — R402363 Coma scale, best motor response, obeys commands, at hospital admission: Secondary | ICD-10-CM | POA: Diagnosis present

## 2017-10-01 DIAGNOSIS — E1159 Type 2 diabetes mellitus with other circulatory complications: Secondary | ICD-10-CM | POA: Diagnosis present

## 2017-10-01 DIAGNOSIS — E785 Hyperlipidemia, unspecified: Secondary | ICD-10-CM | POA: Diagnosis present

## 2017-10-01 DIAGNOSIS — E119 Type 2 diabetes mellitus without complications: Secondary | ICD-10-CM

## 2017-10-01 DIAGNOSIS — I10 Essential (primary) hypertension: Secondary | ICD-10-CM | POA: Diagnosis present

## 2017-10-01 DIAGNOSIS — I639 Cerebral infarction, unspecified: Principal | ICD-10-CM

## 2017-10-01 DIAGNOSIS — Z79899 Other long term (current) drug therapy: Secondary | ICD-10-CM

## 2017-10-01 DIAGNOSIS — R402253 Coma scale, best verbal response, oriented, at hospital admission: Secondary | ICD-10-CM | POA: Diagnosis present

## 2017-10-01 DIAGNOSIS — I152 Hypertension secondary to endocrine disorders: Secondary | ICD-10-CM | POA: Diagnosis present

## 2017-10-01 DIAGNOSIS — Z7982 Long term (current) use of aspirin: Secondary | ICD-10-CM

## 2017-10-01 LAB — COMPREHENSIVE METABOLIC PANEL
ALT: 16 U/L (ref 14–54)
ANION GAP: 9 (ref 5–15)
AST: 16 U/L (ref 15–41)
Albumin: 3.9 g/dL (ref 3.5–5.0)
Alkaline Phosphatase: 45 U/L (ref 38–126)
BUN: 11 mg/dL (ref 6–20)
CHLORIDE: 106 mmol/L (ref 101–111)
CO2: 23 mmol/L (ref 22–32)
Calcium: 10 mg/dL (ref 8.9–10.3)
Creatinine, Ser: 0.68 mg/dL (ref 0.44–1.00)
GFR calc Af Amer: >60 mL/min (ref >60)
GFR calc non Af Amer: >60 mL/min (ref >60)
GLUCOSE: 195 mg/dL — AB (ref 65–99)
POTASSIUM: 3.8 mmol/L (ref 3.5–5.1)
Sodium: 138 mmol/L (ref 135–145)
TOTAL PROTEIN: 7.1 g/dL (ref 6.5–8.1)
Total Bilirubin: 0.7 mg/dL (ref 0.3–1.2)

## 2017-10-01 LAB — I-STAT CHEM 8, ED
BUN: 13 mg/dL (ref 6–20)
CREATININE: 0.6 mg/dL (ref 0.44–1.00)
Calcium, Ion: 1.21 mmol/L (ref 1.15–1.40)
Chloride: 106 mmol/L (ref 101–111)
Glucose, Bld: 199 mg/dL — ABNORMAL HIGH (ref 65–99)
HEMATOCRIT: 43 % (ref 36.0–46.0)
HEMOGLOBIN: 14.6 g/dL (ref 12.0–15.0)
POTASSIUM: 3.8 mmol/L (ref 3.5–5.1)
SODIUM: 141 mmol/L (ref 135–145)
TCO2: 23 mmol/L (ref 22–32)

## 2017-10-01 LAB — CBC
HCT: 42 % (ref 36.0–46.0)
HEMOGLOBIN: 14.2 g/dL (ref 12.0–15.0)
MCH: 32.4 pg (ref 26.0–34.0)
MCHC: 33.8 g/dL (ref 30.0–36.0)
MCV: 95.9 fL (ref 78.0–100.0)
Platelets: UNDETERMINED 10*3/uL (ref 150–400)
RBC: 4.38 MIL/uL (ref 3.87–5.11)
RDW: 13.2 % (ref 11.5–15.5)
WBC: 6.8 10*3/uL (ref 4.0–10.5)

## 2017-10-01 LAB — DIFFERENTIAL
BASOS PCT: 0 %
Basophils Absolute: 0 10*3/uL (ref 0.0–0.1)
EOS PCT: 1 %
Eosinophils Absolute: 0.1 10*3/uL (ref 0.0–0.7)
LYMPHS PCT: 37 %
Lymphs Abs: 2.5 10*3/uL (ref 0.7–4.0)
MONOS PCT: 7 %
Monocytes Absolute: 0.5 10*3/uL (ref 0.1–1.0)
NEUTROS ABS: 3.7 10*3/uL (ref 1.7–7.7)
Neutrophils Relative %: 55 %

## 2017-10-01 LAB — POCT GLYCOSYLATED HEMOGLOBIN (HGB A1C): Hemoglobin A1C: 6.6

## 2017-10-01 LAB — I-STAT TROPONIN, ED: Troponin i, poc: 0 ng/mL (ref 0.00–0.08)

## 2017-10-01 LAB — PROTIME-INR
INR: 0.99
PROTHROMBIN TIME: 13 s (ref 11.4–15.2)

## 2017-10-01 LAB — APTT: aPTT: 30 seconds (ref 24–36)

## 2017-10-01 MED ORDER — IOPAMIDOL (ISOVUE-370) INJECTION 76%
INTRAVENOUS | Status: AC
  Administered 2017-10-02: 50 mL
  Filled 2017-10-01: qty 50

## 2017-10-01 MED FILL — METFORMIN HCL ER 500 MG TAB: 500 | 30 days supply | Qty: 60 | Fill #2

## 2017-10-01 MED FILL — ?ATORVASTATIN 40MG TABLET: 40 | 30 days supply | Qty: 30 | Fill #2

## 2017-10-01 MED FILL — hydrALAZINE HCL 10 MG TABS: 10 | 30 days supply | Qty: 90 | Fill #2

## 2017-10-01 MED FILL — $LANTUS 100 UNITS/ML VIAL: 100 | 30 days supply | Qty: 10 | Fill #1

## 2017-10-01 NOTE — ED Notes (Signed)
Patient transported to MRI 

## 2017-10-01 NOTE — ED Notes (Signed)
Dr. Gardner at bedside 

## 2017-10-01 NOTE — Progress Notes (Signed)
Subjective:  Patient ID: Tammy Bauer, female    DOB: 1966/10/06  Age: 51 y.o. MRN: [id number]  CC: f/u DM and HTN  HPI  Tammy Bauer is a 51 y.o. RHD female with a medical history of HTN, Cryptogenic stroke, DM2, tobacco abuse, and noncompliance presents on f/u of DM2 and HTN. Also complains of right hand weakness. She was seen by neurology on 07/30/17 s/p cryptogenic stroke and advised to continue on Aspirin 325 mg qday, control BP, maintain A1c below 6.5%, and keep LDL to below 70 mg/dL. Last A1c 11.1% on 06/25/17. A1c 6.6% in clinic today. Last BP 145/85 on 08/20/17.  BP 178/98 in clinic today. Says she did not take her anti-hypertensives today. Takes carvedilol 6.25 mg BID, Hydralazine 10 mg TID, Losartan 50 mg, and Amlodipine 10 mg qday. Reports BP is normally 130s/80s.    Complains of right hand weakness since four days ago. Weakness is concentrated mostly at the right thumb and index finger. Less weakness on the 3rd and 4th digit. Right fifth digit unaffected. Difficult for her to squeeze tightly or to firmly grasp items. Feeling somewhat better today. She is able to approximate fingers today and has more strength.  Had an injury to RUE just inferior to medial deltoid while moving furniture approximately 3 weeks ago and does not know if this is related. Continues to take her aspirin daily. Denies HA, neck pain, CP, palpitations, tingling, numbness, pain, confusion, seizures, syncope, speech disturbance, other neurological deficit, fever, chills, nausea, or vomiting.    Outpatient Medications Prior to Visit  Medication Sig Dispense Refill  . acetaminophen (TYLENOL) 325 MG tablet Take 650 mg by mouth every 6 (six) hours as needed for mild pain or moderate pain.     Marland Kitchen amLODipine (NORVASC) 10 MG tablet Take 1 tablet (10 mg total) by mouth daily. TAKE ONE TABLET BY MOUTH ONCE DAILY 30 tablet 5  . aspirin 325 MG tablet Take 1 tablet (325 mg total) by mouth daily. 30 tablet 5  . atorvastatin  (LIPITOR) 40 MG tablet Take 1 tablet (40 mg total) by mouth at bedtime. Any generic statin from [facility] list is okay 30 tablet 5  . blood glucose meter kit and supplies KIT Dispense based on patient and insurance preference. Use up to four times daily as directed. (FOR ICD-9 250.00, 250.01). 1 each 0  . carvedilol (COREG) 6.25 MG tablet Take 1 tablet (6.25 mg total) by mouth 2 (two) times daily with a meal. 60 tablet 5  . glucose blood (RELION GLUCOSE TEST STRIPS) test strip Use as instructed 100 each 12  . hydrALAZINE (APRESOLINE) 10 MG tablet Take 1 tablet (10 mg total) by mouth 3 (three) times daily. 90 tablet 5  . insulin glargine (LANTUS) 100 UNIT/ML injection Inject 0.2 mLs (20 Units total) into the skin daily. 10 mL 3  . INSULIN SYRINGE 1CC/29G (B-D INSULIN SYRINGE) 29G X 1/2" 1 ML MISC 1 application by Does not apply route at bedtime. 30 each 5  . losartan (COZAAR) 50 MG tablet Take 1 tablet (50 mg total) by mouth daily. 30 tablet 5  . metFORMIN (GLUCOPHAGE XR) 500 MG 24 hr tablet Take 2 tablets (1,000 mg total) by mouth daily with breakfast. 60 tablet 5   No facility-administered medications prior to visit.      ROS Review of Systems  Constitutional: Negative for chills, fever and malaise/fatigue.  Eyes: Negative for blurred vision.  Respiratory: Negative for shortness of breath.   Cardiovascular: Negative for  chest pain and palpitations.  Gastrointestinal: Negative for abdominal pain and nausea.  Genitourinary: Negative for dysuria and hematuria.  Musculoskeletal: Negative for joint pain, myalgias and neck pain.  Skin: Negative for rash.  Neurological: Negative for tingling and headaches.       Right hand weakness.  Psychiatric/Behavioral: Negative for depression. The patient is not nervous/anxious.     Objective:  BP (!) 178/98 (BP Location: Left Arm, Patient Position: Sitting, Cuff Size: Normal) Comment: without medication  Pulse 78   Temp 98 F (36.7 C) (Oral)   Wt 141  lb 9.6 oz (64.2 kg)   LMP 08/25/2017 (Approximate)   SpO2 94%   BMI 25.90 kg/m   BP/Weight 10/01/2017 08/20/2017 8/00/3491  Systolic BP 791 505 697  Diastolic BP 98 85 84  Wt. (Lbs) 141.6 142.4 145  BMI 25.9 26.05 26.52      Physical Exam  Constitutional: She is oriented to person, place, and time.  Well developed, well nourished, NAD, polite  HENT:  Head: Normocephalic and atraumatic.  Eyes: Conjunctivae and EOM are normal. No scleral icterus.  Neck: Normal range of motion. Neck supple.  Cardiovascular: Normal rate, regular rhythm and normal heart sounds.   Pulmonary/Chest: Effort normal and breath sounds normal. No respiratory distress.  Musculoskeletal: She exhibits no edema.  Neck and right shoulder with full nonpainful aROM. No muscular spasm of the upper back/neck.  Neurological: She is alert and oriented to person, place, and time. No cranial nerve deficit. Coordination normal.  Right hand grip strength 4/5. Weakness more pronounced on the 1st and 2nd digits, less so on the 3rd and 4th. Right fifth finger without weakness. Speech normal. No other gross neurological deficits noted.  Skin: Skin is warm and dry. No rash noted. No erythema. No pallor.  Psychiatric: She has a normal mood and affect. Her behavior is normal. Thought content normal.  Vitals reviewed.    Assessment & Plan:   1. Type 2 diabetes mellitus without complication, with long-term current use of insulin ([facility]) - HgB A1c 6.6% in clinic today. - Continue on Metformin and Lantus.  2. Other symptoms and signs involving the nervous system - Seems she may have had a TIA vs less likely injury to radial or median nerve while moving.  - MR Brain Wo Contrast; Future scheduled for 10/12/17 which is too far off. I have advised pt to go to the ED and have evaluation and imaging done today.  - I have advised patient to continue her Aspirin and take anti-hypertensives as directed.  - I have advised patient to call her  neurologist to schedule an appointment.    Follow-up: Return in about 4 weeks (around 10/29/2017) for vaccination and weakness.   Clent Demark PA

## 2017-10-01 NOTE — Consult Note (Signed)
Neurology Consultation Reason for Consult: Hand weakness Referring Physician: Cardama, P  CC: Right hand weakness  History is obtained from: Patient  HPI: Tammy Bauer is a 51 y.o. female with a history of prior stroke who presents with right hand weakness that started Wednesday.  She did not injure the hand, there is no pain associated with it.  She did not not notice any numbness associated with it.  Due to this, she came into the emergency department and had a head CT which demonstrates hypodensity in subcortical white matter which was not clearly present on previous MRI, there is question about the acuity.   LKW: Wednesday tpa given?: no, out of window    ROS: A 14 point ROS was performed and is negative except as noted in the HPI.  Past Medical History:  Diagnosis Date  . Diabetes mellitus without complication (HCC)   . Hypertension   . Stroke [facility](HCC)      Family History  Problem Relation Age of Onset  . Stroke Maternal Grandfather      Social History:  reports that she has been smoking Cigarettes.  She started smoking about 18 years ago. She has been smoking about 0.25 packs per day. She has never used smokeless tobacco. She reports that she does not drink alcohol or use drugs.   Exam: Current vital signs: BP (!) 164/91 (BP Location: Left Arm)   Pulse 74   Temp 97.8 F (36.6 C) (Oral)   Resp 12   Ht 5\' 2"  (1.575 m)   Wt 63.5 kg (140 lb)   SpO2 97%   BMI 25.61 kg/m  Vital signs in last 24 hours: Temp:  [97.8 F (36.6 C)-98 F (36.7 C)] 97.8 F (36.6 C) (10/29 1547) Pulse Rate:  [74-87] 74 (10/29 1547) Resp:  [12-14] 12 (10/29 1547) BP: (153-178)/(89-98) 164/91 (10/29 1547) SpO2:  [94 %-99 %] 97 % (10/29 1547) Weight:  [63.5 kg (140 lb)-64.2 kg (141 lb 9.6 oz)] 63.5 kg (140 lb) (10/29 1217)   Physical Exam  Constitutional: Appears well-developed and well-nourished.  Psych: Affect appropriate to situation Eyes: No scleral injection HENT: No OP  obstrucion Head: Normocephalic.  Cardiovascular: Normal rate and regular rhythm.  Respiratory: Effort normal and breath sounds normal to anterior ascultation GI: Soft.  No distension. There is no tenderness.  Skin: WDI  Neuro: Mental Status: Patient is awake, alert, oriented to person, place, month, year, and situation. Patient is able to give a clear and coherent history. No signs of aphasia or neglect Cranial Nerves: II: Visual Fields are full. Pupils are equal, round, and reactive to light.   III,IV, VI: EOMI without ptosis she has diplopia which is worse on left gaze V: Facial sensation is symmetric to temperature VII: Facial movement is symmetric.  VIII: hearing is intact to voice X: Uvula elevates symmetrically XI: Shoulder shrug is symmetric. XII: tongue is midline without atrophy or fasciculations.  Motor: Tone is normal. Bulk is normal. 5/5 strength was present in left arm and bilateral legs.  In the right arm, she has good proximal strength, however distally she has impaired abduction of the thumb and adjacent two digits.  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  It is possible that the lesion on MRI occurred near the same time as her previous stroke, but certainly could represent new infarct as well.  Recommendations: 1) MRI brain, if there is a new stroke then I would favor admission with revisiting her vascular imaging with CTA  head and neck and repeating her echocardiogram   Ritta Slot, MD [facility] [phone]  If 7pm- 7am, please page neurology on call as listed in AMION.

## 2017-10-01 NOTE — ED Triage Notes (Signed)
Per Pt, Pt is coming from MD with complaints of right hand numbness that started four days ago. Pt has inability to fully grip with right hand. Denied arm drift or weakness. Very slight right facial droop. Denies any confusion or aphasia.

## 2017-10-01 NOTE — Progress Notes (Signed)
Pt states she does not have grip on her right hand,  Denies pain

## 2017-10-01 NOTE — Telephone Encounter (Signed)
Mrs Tammy Bauer wants to let you know that she can't wait any more at the Emergency room they did a Ct Scan and she has a son to pick up from school and if PA Tammy Bauer can call her back thank you

## 2017-10-01 NOTE — ED Provider Notes (Signed)
[facility] EMERGENCY DEPARTMENT Provider Note   CSN: [id number] Arrival date & time: 10/01/17  1200    History   Chief Complaint Chief Complaint  Patient presents with  . Numbness    HPI Tammy Bauer is a 51 y.o. female.  The history is provided by the patient and medical records. No language interpreter was used.   Tammy Bauer is a 51 y.o. female  with a PMH of prior CVA, HTN, DM who presents to the Emergency Department complaining of right grip strength weakness which began 5 days ago. Patient states that she did not notice any other associated symptoms.  She is right-hand dominant and started to write on Wednesday, when she realized that it was difficult and her handwriting was not the same.  She then realized that she had little grip strength.  Because she has history of a prior stroke, she states that she smiled in the mirror and lifted her arms above her head without any difficulty.  She thought this was strange, but since she could do the other tasks, she did not think this was a stroke. She was seen by PCP this morning where she was sent to ED for further workup.  Denies headache, numbness, tingling, lower extremity weakness, difficulty with speech, facial asymmetry or acute visual changes.  Past Medical History:  Diagnosis Date  . Diabetes mellitus without complication ([place])   . Hypertension   . Stroke [facility])     Patient Active Problem List   Diagnosis Date Noted  . Acute CVA (cerebrovascular accident) ([place]) 06/25/2017  . Hypertensive emergency 06/25/2017  . Tobacco abuse 06/25/2017  . Essential hypertension 10/07/2015  . Hyperglycemia 10/07/2015    Past Surgical History:  Procedure Laterality Date  . LOOP RECORDER INSERTION N/A 06/27/2017   Procedure: Loop Recorder Insertion;  Surgeon: Constance Haw, MD;  Location: [place] CV LAB;  Service: Cardiovascular;  Laterality: N/A;  . TEE WITHOUT CARDIOVERSION N/A 06/27/2017   Procedure:  TRANSESOPHAGEAL ECHOCARDIOGRAM (TEE);  Surgeon: Pixie Casino, MD;  Location: [facility] ENDOSCOPY;  Service: Cardiovascular;  Laterality: N/A;    OB History    No data available       Home Medications    Prior to Admission medications   Medication Sig Start Date End Date Taking? Authorizing Provider  acetaminophen (TYLENOL) 325 MG tablet Take 650 mg by mouth every 6 (six) hours as needed for mild pain or moderate pain.     [provider]  amLODipine (NORVASC) 10 MG tablet Take 1 tablet (10 mg total) by mouth daily. TAKE ONE TABLET BY MOUTH ONCE DAILY 07/18/17   Clent Demark, PA-C  aspirin 325 MG tablet Take 1 tablet (325 mg total) by mouth daily. 07/18/17   Clent Demark, PA-C  atorvastatin (LIPITOR) 40 MG tablet Take 1 tablet (40 mg total) by mouth at bedtime. Any generic statin from [employer] list is okay 07/18/17   Clent Demark, PA-C  blood glucose meter kit and supplies KIT Dispense based on patient and insurance preference. Use up to four times daily as directed. (FOR ICD-9 250.00, 250.01). 07/18/17   Clent Demark, PA-C  carvedilol (COREG) 6.25 MG tablet Take 1 tablet (6.25 mg total) by mouth 2 (two) times daily with a meal. 07/18/17   Clent Demark, PA-C  glucose blood (RELION GLUCOSE TEST STRIPS) test strip Use as instructed 07/18/17   Clent Demark, PA-C  hydrALAZINE (APRESOLINE) 10 MG tablet Take 1 tablet (10 mg  total) by mouth 3 (three) times daily. 07/18/17 07/18/18  Loletta Specter, PA-C  insulin glargine (LANTUS) 100 UNIT/ML injection Inject 0.2 mLs (20 Units total) into the skin daily. 08/21/17   Quentin Angst, MD  INSULIN SYRINGE 1CC/29G (B-D INSULIN SYRINGE) 29G X 1/2" 1 ML MISC 1 application by Does not apply route at bedtime. 07/18/17   Loletta Specter, PA-C  losartan (COZAAR) 50 MG tablet Take 1 tablet (50 mg total) by mouth daily. 08/20/17   Loletta Specter, PA-C  metFORMIN (GLUCOPHAGE XR) 500 MG 24 hr tablet Take 2 tablets (1,000  mg total) by mouth daily with breakfast. 07/18/17   Loletta Specter, PA-C    Family History Family History  Problem Relation Age of Onset  . Stroke Maternal Grandfather     Social History Social History  Substance Use Topics  . Smoking status: Current Every Day Smoker    Packs/day: 0.25    Types: Cigarettes    Start date: 08/29/1999  . Smokeless tobacco: Never Used     Comment: smoke 5 cigarettes a day  . Alcohol use No     Allergies   Ace inhibitors   Review of Systems Review of Systems  Neurological: Positive for weakness. Negative for dizziness, syncope, facial asymmetry, speech difficulty, numbness and headaches.  All other systems reviewed and are negative.    Physical Exam Updated Vital Signs BP (!) 164/91 (BP Location: Left Arm)   Pulse 74   Temp 97.8 F (36.6 C) (Oral)   Resp 12   Ht 5\' 2"  (1.575 m)   Wt 63.5 kg (140 lb)   SpO2 97%   BMI 25.61 kg/m   Physical Exam  Constitutional: She is oriented to person, place, and time. She appears well-developed and well-nourished. No distress.  HENT:  Head: Normocephalic and atraumatic.  Cardiovascular: Normal rate, regular rhythm and normal heart sounds.   No murmur heard. Pulmonary/Chest: Effort normal and breath sounds normal. No respiratory distress.  Abdominal: Soft. She exhibits no distension. There is no tenderness.  Musculoskeletal: She exhibits no edema.  Neurological: She is alert and oriented to person, place, and time.  Decreased grip strength and pincer grasp on right compared to left.  Alert, oriented, thought content appropriate, able to give a coherent history. Speech is clear and goal oriented, able to follow commands.  Cranial Nerves:  II:  Peripheral visual fields grossly normal, pupils equal, round, reactive to light III, IV, VI: EOM intact bilaterally, ptosis not present V,VII: smile symmetric, eyes kept closed tightly against resistance, facial light touch sensation equal VIII: hearing  grossly normal IX, X: symmetric soft palate movement, uvula elevates symmetrically  XI: bilateral shoulder shrug symmetric and strong XII: midline tongue extension Sensory to light touch normal in all four extremities.  Normal finger-to-nose and rapid alternating movements; normal gait and balance. No drift.  Skin: Skin is warm and dry.  Nursing note and vitals reviewed.    ED Treatments / Results  Labs (all labs ordered are listed, but only abnormal results are displayed) Labs Reviewed  COMPREHENSIVE METABOLIC PANEL - Abnormal; Notable for the following:       Result Value   Glucose, Bld 195 (*)    All other components within normal limits  I-STAT CHEM 8, ED - Abnormal; Notable for the following:    Glucose, Bld 199 (*)    All other components within normal limits  PROTIME-INR  APTT  CBC  DIFFERENTIAL  I-STAT TROPONIN, ED  CBG  MONITORING, ED    EKG  EKG Interpretation None       Radiology Ct Head Wo Contrast  Result Date: 10/01/2017 CLINICAL DATA:  Right handed weakness for several days EXAM: CT HEAD WITHOUT CONTRAST TECHNIQUE: Contiguous axial images were obtained from the base of the skull through the vertex without intravenous contrast. COMPARISON:  06/25/2017 FINDINGS: Brain: There is an area of decreased attenuation identified on the left in the superior aspect of the temporal lobe consistent with prior infarct. This corresponds with that seen on prior MRI examination. Some scattered decreased attenuation is noted in the left centrum semi ovale also likely related to prior ischemia. No findings to suggest acute hemorrhage, acute infarction or space-occupying mass lesion are noted. Vascular: No hyperdense vessel or unexpected calcification. Skull: Normal. Negative for fracture or focal lesion. Sinuses/Orbits: No acute finding. Other: None. IMPRESSION: Areas on the left consistent with prior ischemia. The more prominent area is along the superior aspect of the left  temporal lobe similar to that seen on the prior MRI examination. The area the deep centrum semiovale on the left is new from that MRI but also likely of a more subacute to chronic nature. Electronically Signed   By: Inez Catalina M.D.   On: 10/01/2017 14:07    Procedures Procedures (including critical care time)  Medications Ordered in ED Medications - No data to display   Initial Impression / Assessment and Plan / ED Course  I have reviewed the triage vital signs and the nursing notes.  Pertinent labs & imaging results that were available during my care of the patient were reviewed by me and considered in my medical decision making (see chart for details).    Tammy Bauer is a 51 y.o. female who presents to ED for right grip strength weakness on Wednesday, 5 days ago. Hx of prior CVA on 324 mg aspirin. Exam with right grip strength and pincer grasp weakness. CT reviewed:    IMPRESSION: Areas on the left consistent with prior ischemia. The more prominent area is along the superior aspect of the left temporal lobe similar to that seen on the prior MRI examination. The area the deep centrum semiovale on the left is new from that MRI but also likely of a more subacute to chronic nature.  Neurology, Dr. Leonel Ramsay consulted who will evaluate patient. Care assumed by oncoming provider, Dr. Leonette Monarch, who will follow up on neuro recommendations and dispo appropriately.   Final Clinical Impressions(s) / ED Diagnoses   Final diagnoses:  None    New Prescriptions New Prescriptions   No medications on file     Tabb Croghan, Ozella Almond, PA-C 10/01/17 2012    Fatima Blank, MD 10/01/17 [phone]

## 2017-10-01 NOTE — Telephone Encounter (Signed)
I spoke to patient of her CT Brain result and advised that she go back to the ED for evaluation of her new CT finding.

## 2017-10-01 NOTE — Patient Instructions (Addendum)
Make sure to call your neurologist today. Go to the Emergency Room/call ambulance should you have other neurological deficits, headache, or worsening of your right hand strength.    Stroke Prevention Some medical conditions and behaviors are associated with an increased chance of having a stroke. You may prevent a stroke by making healthy choices and managing medical conditions. How can I reduce my risk of having a stroke?  Stay physically active. Get at least 30 minutes of activity on most or all days.  Do not smoke. It may also be helpful to avoid exposure to secondhand smoke.  Limit alcohol use. Moderate alcohol use is considered to be: ? No more than 2 drinks per day for men. ? No more than 1 drink per day for nonpregnant women.  Eat healthy foods. This involves: ? Eating 5 or more servings of fruits and vegetables a day. ? Making dietary changes that address high blood pressure (hypertension), high cholesterol, diabetes, or obesity.  Manage your cholesterol levels. ? Making food choices that are high in fiber and low in saturated fat, trans fat, and cholesterol may control cholesterol levels. ? Take any prescribed medicines to control cholesterol as directed by your health care provider.  Manage your diabetes. ? Controlling your carbohydrate and sugar intake is recommended to manage diabetes. ? Take any prescribed medicines to control diabetes as directed by your health care provider.  Control your hypertension. ? Making food choices that are low in salt (sodium), saturated fat, trans fat, and cholesterol is recommended to manage hypertension. ? Ask your health care provider if you need treatment to lower your blood pressure. Take any prescribed medicines to control hypertension as directed by your health care provider. ? If you are 7618-49 years of age, have your blood pressure checked every 3-5 years. If you are 49 years of age or older, have your blood pressure checked every  year.  Maintain a healthy weight. ? Reducing calorie intake and making food choices that are low in sodium, saturated fat, trans fat, and cholesterol are recommended to manage weight.  Stop drug abuse.  Avoid taking birth control pills. ? Talk to your health care provider about the risks of taking birth control pills if you are over 49 years old, smoke, get migraines, or have ever had a blood clot.  Get evaluated for sleep disorders (sleep apnea). ? Talk to your health care provider about getting a sleep evaluation if you snore a lot or have excessive sleepiness.  Take medicines only as directed by your health care provider. ? For some people, aspirin or blood thinners (anticoagulants) are helpful in reducing the risk of forming abnormal blood clots that can lead to stroke. If you have the irregular heart rhythm of atrial fibrillation, you should be on a blood thinner unless there is a good reason you cannot take them. ? Understand all your medicine instructions.  Make sure that other conditions (such as anemia or atherosclerosis) are addressed. Get help right away if:  You have sudden weakness or numbness of the face, arm, or leg, especially on one side of the body.  Your face or eyelid droops to one side.  You have sudden confusion.  You have trouble speaking (aphasia) or understanding.  You have sudden trouble seeing in one or both eyes.  You have sudden trouble walking.  You have dizziness.  You have a loss of balance or coordination.  You have a sudden, severe headache with no known cause.  You have new  chest pain or an irregular heartbeat. Any of these symptoms may represent a serious problem that is an emergency. Do not wait to see if the symptoms will go away. Get medical help at once. Call your local emergency services (911 in [place] Do not drive yourself to the hospital. This information is not intended to replace advice given to you by your health care provider.  Make sure you discuss any questions you have with your health care provider. Document Released: 12/28/2004 Document Revised: 04/27/2016 Document Reviewed: 05/23/2013 Elsevier Interactive Patient Education  2017 [employer].

## 2017-10-02 ENCOUNTER — Encounter (HOSPITAL_COMMUNITY): Payer: Self-pay

## 2017-10-02 ENCOUNTER — Inpatient Hospital Stay (HOSPITAL_COMMUNITY): Payer: Medicaid Other

## 2017-10-02 DIAGNOSIS — F17210 Nicotine dependence, cigarettes, uncomplicated: Secondary | ICD-10-CM | POA: Diagnosis present

## 2017-10-02 DIAGNOSIS — R29702 NIHSS score 2: Secondary | ICD-10-CM | POA: Diagnosis present

## 2017-10-02 DIAGNOSIS — R402253 Coma scale, best verbal response, oriented, at hospital admission: Secondary | ICD-10-CM | POA: Diagnosis present

## 2017-10-02 DIAGNOSIS — I639 Cerebral infarction, unspecified: Principal | ICD-10-CM

## 2017-10-02 DIAGNOSIS — I10 Essential (primary) hypertension: Secondary | ICD-10-CM

## 2017-10-02 DIAGNOSIS — E119 Type 2 diabetes mellitus without complications: Secondary | ICD-10-CM

## 2017-10-02 DIAGNOSIS — R402363 Coma scale, best motor response, obeys commands, at hospital admission: Secondary | ICD-10-CM | POA: Diagnosis present

## 2017-10-02 DIAGNOSIS — E041 Nontoxic single thyroid nodule: Secondary | ICD-10-CM

## 2017-10-02 DIAGNOSIS — R402143 Coma scale, eyes open, spontaneous, at hospital admission: Secondary | ICD-10-CM | POA: Diagnosis present

## 2017-10-02 DIAGNOSIS — Z794 Long term (current) use of insulin: Secondary | ICD-10-CM | POA: Diagnosis not present

## 2017-10-02 DIAGNOSIS — E1159 Type 2 diabetes mellitus with other circulatory complications: Secondary | ICD-10-CM

## 2017-10-02 DIAGNOSIS — Z7982 Long term (current) use of aspirin: Secondary | ICD-10-CM | POA: Diagnosis not present

## 2017-10-02 DIAGNOSIS — Z79899 Other long term (current) drug therapy: Secondary | ICD-10-CM | POA: Diagnosis not present

## 2017-10-02 DIAGNOSIS — E785 Hyperlipidemia, unspecified: Secondary | ICD-10-CM | POA: Diagnosis present

## 2017-10-02 DIAGNOSIS — I361 Nonrheumatic tricuspid (valve) insufficiency: Secondary | ICD-10-CM

## 2017-10-02 HISTORY — DX: Nontoxic single thyroid nodule: E04.1

## 2017-10-02 LAB — LIPID PANEL
Cholesterol: 113 mg/dL (ref 0–200)
HDL: 39 mg/dL — ABNORMAL LOW (ref >40)
LDL CALC: 61 mg/dL (ref 0–99)
Total CHOL/HDL Ratio: 2.9 RATIO
Triglycerides: 64 mg/dL (ref <150)
VLDL: 13 mg/dL (ref 0–40)

## 2017-10-02 LAB — ECHOCARDIOGRAM COMPLETE
HEIGHTINCHES: 62 in
WEIGHTICAEL: 2240 [oz_av]

## 2017-10-02 LAB — GLUCOSE, CAPILLARY
GLUCOSE-CAPILLARY: 114 mg/dL — AB (ref 65–99)
GLUCOSE-CAPILLARY: 123 mg/dL — AB (ref 65–99)
GLUCOSE-CAPILLARY: 104 mg/dL — AB (ref 65–99)
GLUCOSE-CAPILLARY: 144 mg/dL — AB (ref 65–99)

## 2017-10-02 MED ORDER — CLOPIDOGREL BISULFATE 75 MG PO TABS: 75.0000 mg | ORAL_TABLET | Freq: Every day | ORAL | 0 refills | Status: DC

## 2017-10-02 MED ORDER — NICOTINE 7 MG/24HR TD PT24
7.0000 mg | MEDICATED_PATCH | Freq: Every day | TRANSDERMAL | Status: DC
  Filled 2017-10-02: qty 1

## 2017-10-02 MED ORDER — ASPIRIN EC 81 MG PO TBEC
81.0000 mg | DELAYED_RELEASE_TABLET | Freq: Every day | ORAL | Status: DC
  Administered 2017-10-02: 81 mg via ORAL
  Filled 2017-10-02: qty 1

## 2017-10-02 MED ORDER — STROKE: EARLY STAGES OF RECOVERY BOOK
Freq: Once | Status: AC
  Administered 2017-10-02: 06:00:00
  Filled 2017-10-02 (×2): qty 1

## 2017-10-02 MED ORDER — CLOPIDOGREL BISULFATE 75 MG PO TABS
75.0000 mg | ORAL_TABLET | Freq: Every day | ORAL | Status: DC
  Administered 2017-10-02: 75 mg via ORAL
  Filled 2017-10-02: qty 1

## 2017-10-02 MED ORDER — ASPIRIN 81 MG PO TBEC: 81.0000 mg | DELAYED_RELEASE_TABLET | Freq: Every day | ORAL | 0 refills | Status: DC

## 2017-10-02 MED ORDER — ACETAMINOPHEN 325 MG PO TABS: 650.0000 mg | ORAL_TABLET | ORAL | Status: DC | PRN

## 2017-10-02 MED ORDER — ENOXAPARIN SODIUM 40 MG/0.4ML ~~LOC~~ SOLN
40.0000 mg | SUBCUTANEOUS | Status: DC
  Administered 2017-10-02: 40 mg via SUBCUTANEOUS
  Filled 2017-10-02: qty 0.4

## 2017-10-02 MED ORDER — INSULIN GLARGINE 100 UNIT/ML ~~LOC~~ SOLN: 20.0000 [IU] | Freq: Every day | SUBCUTANEOUS | Status: DC

## 2017-10-02 MED ORDER — ACETAMINOPHEN 160 MG/5ML PO SOLN: 650.0000 mg | ORAL | Status: DC | PRN

## 2017-10-02 MED ORDER — NICOTINE 7 MG/24HR TD PT24: 7.0000 mg | MEDICATED_PATCH | Freq: Every day | TRANSDERMAL | 0 refills | Status: DC

## 2017-10-02 MED ORDER — ATORVASTATIN CALCIUM 40 MG PO TABS
40.0000 mg | ORAL_TABLET | Freq: Every day | ORAL | Status: DC
  Administered 2017-10-02: 40 mg via ORAL
  Filled 2017-10-02: qty 1

## 2017-10-02 MED ORDER — ACETAMINOPHEN 650 MG RE SUPP: 650.0000 mg | RECTAL | Status: DC | PRN

## 2017-10-02 MED ORDER — CARVEDILOL 6.25 MG PO TABS
6.2500 mg | ORAL_TABLET | Freq: Two times a day (BID) | ORAL | Status: DC
  Administered 2017-10-02 (×2): 6.25 mg via ORAL
  Filled 2017-10-02 (×2): qty 1

## 2017-10-02 MED ORDER — LOSARTAN POTASSIUM 50 MG PO TABS
50.0000 mg | ORAL_TABLET | Freq: Every day | ORAL | Status: DC
  Administered 2017-10-02: 50 mg via ORAL
  Filled 2017-10-02: qty 1

## 2017-10-02 MED ORDER — HYDRALAZINE HCL 10 MG PO TABS
10.0000 mg | ORAL_TABLET | Freq: Three times a day (TID) | ORAL | Status: DC
  Administered 2017-10-02 (×3): 10 mg via ORAL
  Filled 2017-10-02 (×3): qty 1

## 2017-10-02 MED ORDER — INSULIN ASPART 100 UNIT/ML ~~LOC~~ SOLN
0.0000 [IU] | Freq: Three times a day (TID) | SUBCUTANEOUS | Status: DC
  Administered 2017-10-02: 1 [IU] via SUBCUTANEOUS

## 2017-10-02 MED ORDER — AMLODIPINE BESYLATE 10 MG PO TABS
10.0000 mg | ORAL_TABLET | Freq: Every day | ORAL | Status: DC
  Administered 2017-10-02: 10 mg via ORAL
  Filled 2017-10-02: qty 1

## 2017-10-02 MED ORDER — INSULIN GLARGINE 100 UNIT/ML ~~LOC~~ SOLN
15.0000 [IU] | Freq: Every day | SUBCUTANEOUS | Status: DC
  Administered 2017-10-02: 15 [IU] via SUBCUTANEOUS
  Filled 2017-10-02 (×2): qty 0.15

## 2017-10-02 NOTE — Discharge Summary (Signed)
Physician Discharge Summary  Tammy Bauer YFV:[id number] DOB: 06/30/1966 DOA: 10/01/2017  PCP: Clent Demark, PA-C  Admit date: 10/01/2017 Discharge date: 10/02/2017  Admitted From: Home  Disposition:  Home   Recommendations for Outpatient Follow-up:  1. Follow up with PCP in 1-2 weeks 2. Please obtain BMP/CBC in one week 3. Needs to have renal artery duplex out patient     Discharge Condition: Stable.  CODE STATUS; Full code.  Diet recommendation: Heart Healthy   Brief/Interim Summary: 1-Acute, recurrent, cryptogenic, Left frontal lobe nonhemorrhagic infarct, MRI positive  for stroke.  Right facial drop and right hand weakness.  Continue with statins , BP controlled.  Loop recorder will be reviewed by Dr Leonie Man.  HB A1c at 6.  Plan to discharge on aspirin and plavix.  Outpatient OT>  Awaiting ECHO ; no source of embolism.   R/O Renal Artery Stenosis: Unable to get duplex tonight, plan to do out patient.   Hyperlipidemia; continue with lipitor.   HTN; continue with Norvasc and hydralazine.   Discharge Diagnoses:  Principal Problem:   Acute ischemic stroke [facility]) Active Problems:   Essential hypertension   DM2 (diabetes mellitus, type 2) [facility])    Discharge Instructions  Discharge Instructions    Ambulatory referral to Neurology    Complete by:  As directed    An appointment is requested in approximately: 6 weeks Follow up with stroke clinic ( Dr Leonie Man preferred, if not available, then consider Cecille Rubin, Dr Erlinda Hong, [facility] or Jaynee Eagles whoever is available) at [facility] in about 6-8 weeks. Thanks.   Ambulatory referral to Occupational Therapy    Complete by:  As directed    Diet - low sodium heart healthy    Complete by:  As directed    Increase activity slowly    Complete by:  As directed      Allergies as of 10/02/2017      Reactions   Ace Inhibitors Cough      Medication List    STOP taking these medications   aspirin 325 MG tablet Replaced by:   aspirin 81 MG EC tablet     TAKE these medications   acetaminophen 325 MG tablet Commonly known as:  TYLENOL Take 650 mg by mouth every 6 (six) hours as needed for mild pain or moderate pain.   amLODipine 10 MG tablet Commonly known as:  NORVASC Take 1 tablet (10 mg total) by mouth daily. TAKE ONE TABLET BY MOUTH ONCE DAILY   aspirin 81 MG EC tablet Take 1 tablet (81 mg total) by mouth daily. Replaces:  aspirin 325 MG tablet   atorvastatin 40 MG tablet Commonly known as:  LIPITOR Take 1 tablet (40 mg total) by mouth at bedtime. Any generic statin from [employer] list is okay   blood glucose meter kit and supplies Kit Dispense based on patient and insurance preference. Use up to four times daily as directed. (FOR ICD-9 250.00, 250.01).   carvedilol 6.25 MG tablet Commonly known as:  COREG Take 1 tablet (6.25 mg total) by mouth 2 (two) times daily with a meal.   clopidogrel 75 MG tablet Commonly known as:  PLAVIX Take 1 tablet (75 mg total) by mouth daily.   glucose blood test strip Commonly known as:  RELION GLUCOSE TEST STRIPS Use as instructed   hydrALAZINE 10 MG tablet Commonly known as:  APRESOLINE Take 1 tablet (10 mg total) by mouth 3 (three) times daily.   insulin glargine 100 UNIT/ML injection Commonly known as:  LANTUS Inject 0.2 mLs (20 Units total) into the skin daily.   INSULIN SYRINGE 1CC/29G 29G X 1/2" 1 ML Misc Commonly known as:  B-D INSULIN SYRINGE 1 application by Does not apply route at bedtime.   losartan 50 MG tablet Commonly known as:  COZAAR Take 1 tablet (50 mg total) by mouth daily.   metFORMIN 500 MG 24 hr tablet Commonly known as:  GLUCOPHAGE XR Take 2 tablets (1,000 mg total) by mouth daily with breakfast. What changed:  how much to take  when to take this   nicotine 7 mg/24hr patch Commonly known as:  NICODERM CQ - dosed in mg/24 hr Place 1 patch (7 mg total) onto the skin daily.      Follow-up Information    Garvin Fila,  MD. Schedule an appointment as soon as possible for a visit in 6 week(s).   Specialties:  Neurology, Radiology Contact information: [place] [address] [place] Follow up.   Specialty:  Rehabilitation Why:  They will contact you for the first appointment Contact information: [place] [address] [phone]         Allergies  Allergen Reactions  . Ace Inhibitors Cough    Consultations:  Neurology    Procedures/Studies: Ct Angio Head W Or Wo Contrast  Result Date: 10/02/2017 CLINICAL DATA:  Follow-up stroke. History of hypertension and diabetes. EXAM: CT ANGIOGRAPHY HEAD AND NECK TECHNIQUE: Multidetector CT imaging of the head and neck was performed using the standard protocol during bolus administration of intravenous contrast. Multiplanar CT image reconstructions and MIPs were obtained to evaluate the vascular anatomy. Carotid stenosis measurements (when applicable) are obtained utilizing NASCET criteria, using the distal internal carotid diameter as the denominator. CONTRAST:  50 cc Isovue 370 COMPARISON:  MRI and MRA head October 01, 2017 FINDINGS: CT HEAD FINDINGS BRAIN: No intraparenchymal hemorrhage, mass effect nor midline shift. LEFT temporal lobe encephalomalacia. LEFT frontal lobe/centrum semiovale infarcts demonstrated is acute today. Additional patchy supratentorial white matter hypodensities compatible with mild chronic small vessel ischemic disease. No acute large vascular territory infarcts. No abnormal extra-axial fluid collections. Basal cisterns are patent. VASCULAR: Mild calcific atherosclerosis. SKULL/SOFT TISSUES: No skull fracture. No significant soft tissue swelling. ORBITS/SINUSES: The included ocular globes and orbital contents are normal.The mastoid aircells and included paranasal sinuses are well-aerated. OTHER:  None. CTA NECK AORTIC ARCH: Normal appearance of the thoracic arch, normal branch pattern. The origins of the innominate, left Common carotid artery and subclavian artery are widely patent. RIGHT CAROTID SYSTEM: Common carotid artery is widely patent, lateral course of minimal intimal thickening. Moderate calcific atherosclerosis carotid bifurcation without hemodynamically significant stenosis by NASCET criteria. Normal appearance of the internal carotid artery. LEFT CAROTID SYSTEM: Common carotid artery is widely patent, lateral course with minimal intimal thickening. Mild calcific atherosclerosis carotid bifurcation without hemodynamically significant stenosis by NASCET criteria. Normal appearance of the internal carotid artery. VERTEBRAL ARTERIES:Atherosclerosis resulting in moderate tandem stenosis RIGHT vertebral artery V1 segment, mild on the LEFT . Codominant, patent vertebral artery's. Mild extrinsic compression vertebral artery due to degenerative cervical spine. SKELETON: No acute osseous process though bone windows have not been submitted. 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 Endplate spurring resulting in severe RIGHT C3-4, RIGHT C4-5, bilateral C5-6 and bilateral C6-7 neural foraminal narrowing. Multiple dental periapical abscess. OTHER NECK: Soft tissues of the neck are nonacute though, not tailored  for evaluation. Multiple thyroid nodules, dominant 16 mm RIGHT thyroid nodule. UPPER CHEST: Included lung apices are clear. No superior mediastinal lymphadenopathy. CTA HEAD ANTERIOR CIRCULATION: Patent cervical internal carotid arteries, petrous, cavernous and supra clinoid internal carotid arteries. Mild atherosclerosis resulting mild stenosis carotid siphon. Patent anterior and middle cerebral arteries. Severe stenosis LEFT A3 origin. 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 Mild tandem stenosis bilateral middle cerebral artery's. No  large vessel occlusion, significant stenosis, contrast extravasation. POSTERIOR CIRCULATION: Patent vertebral arteries, vertebrobasilar junction and basilar artery, as well as main branch vessels. Patent posterior cerebral arteries. Small LEFT posterior communicating artery present. Moderate stenosis LEFT P2 segment and LEFT P3 segment. No large vessel occlusion, significant stenosis, contrast extravasation or aneurysm. VENOUS SINUSES: Major dural venous sinuses are patent though not tailored for evaluation on this angiographic examination. ANATOMIC VARIANTS: None. DELAYED PHASE: No abnormal intracranial enhancement. MIP images reviewed. IMPRESSION: CT HEAD: 1. Acute small LEFT frontal white matter infarct better characterized on yesterday's MRI head. 2. Old small LEFT temporal lobe/MCA territory infarct. 3. Mild chronic small vessel ischemic disease. CTA NECK: 1. No hemodynamically significant stenosis carotid artery's. Advanced atherosclerosis. 2. Moderate stenoses RIGHT proximal vertebral artery compatible with atherosclerosis. 3. Multilevel severe neural foraminal narrowing. 4. **An incidental finding of potential clinical significance has been found. 16 mm RIGHT thyroid nodule. Recommend thyroid ultrasound on nonemergent basis. This follows ACR consensus guidelines: Managing Incidental Thyroid Nodules Detected on Imaging: White Paper of the ACR Incidental Thyroid Findings Committee. J Am Coll Radiol 2015; 12:143-150.** CTA HEAD: 1. No emergent large vessel occlusion. 2. Severe stenosis LEFT A3 origin. 3. Moderate stenosis bilateral middle and LEFT posterior cerebral artery cerebral artery's compatible with atherosclerosis. 4. 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 Neuro-Interventional Radiology consultation is suggested to evaluate the appropriateness of potential treatment. Non-emergent evaluation can be arranged by calling [phone] during usual hours. Emergency evaluation can be requested by paging  [phone]. Electronically Signed   By: Elon Alas M.D.   On: 10/02/2017 02:19   Ct Head Wo Contrast  Result Date: 10/01/2017 CLINICAL DATA:  Right handed weakness for several days EXAM: CT HEAD WITHOUT CONTRAST TECHNIQUE: Contiguous axial images were obtained from the base of the skull through the vertex without intravenous contrast. COMPARISON:  06/25/2017 FINDINGS: Brain: There is an area of decreased attenuation identified on the left in the superior aspect of the temporal lobe consistent with prior infarct. This corresponds with that seen on prior MRI examination. Some scattered decreased attenuation is noted in the left centrum semi ovale also likely related to prior ischemia. No findings to suggest acute hemorrhage, acute infarction or space-occupying mass lesion are noted. Vascular: No hyperdense vessel or unexpected calcification. Skull: Normal. Negative for fracture or focal lesion. Sinuses/Orbits: No acute finding. Other: None. IMPRESSION: Areas on the left consistent with prior ischemia. The more prominent area is along the superior aspect of the left temporal lobe similar to that seen on the prior MRI examination. The area the deep centrum semiovale on the left is new from that MRI but also likely of a more subacute to chronic nature. Electronically Signed   By: Inez Catalina M.D.   On: 10/01/2017 14:07   Ct Angio Neck W Or Wo Contrast  Result Date: 10/02/2017 CLINICAL DATA:  Follow-up stroke. History of hypertension and diabetes. EXAM: CT ANGIOGRAPHY HEAD AND NECK TECHNIQUE: Multidetector CT imaging of the head and neck was performed using the standard protocol during bolus administration  of intravenous contrast. Multiplanar CT image reconstructions and MIPs were obtained to evaluate the vascular anatomy. Carotid stenosis measurements (when applicable) are obtained utilizing NASCET criteria, using the distal internal carotid diameter as the denominator. CONTRAST:  50 cc Isovue 370  COMPARISON:  MRI and MRA head October 01, 2017 FINDINGS: CT HEAD FINDINGS BRAIN: No intraparenchymal hemorrhage, mass effect nor midline shift. LEFT temporal lobe encephalomalacia. LEFT frontal lobe/centrum semiovale infarcts demonstrated is acute today. Additional patchy supratentorial white matter hypodensities compatible with mild chronic small vessel ischemic disease. No acute large vascular territory infarcts. No abnormal extra-axial fluid collections. Basal cisterns are patent. VASCULAR: Mild calcific atherosclerosis. SKULL/SOFT TISSUES: No skull fracture. No significant soft tissue swelling. ORBITS/SINUSES: The included ocular globes and orbital contents are normal.The mastoid aircells and included paranasal sinuses are well-aerated. OTHER: None. CTA NECK AORTIC ARCH: Normal appearance of the thoracic arch, normal branch pattern. The origins of the innominate, left Common carotid artery and subclavian artery are widely patent. RIGHT CAROTID SYSTEM: Common carotid artery is widely patent, lateral course of minimal intimal thickening. Moderate calcific atherosclerosis carotid bifurcation without hemodynamically significant stenosis by NASCET criteria. Normal appearance of the internal carotid artery. LEFT CAROTID SYSTEM: Common carotid artery is widely patent, lateral course with minimal intimal thickening. Mild calcific atherosclerosis carotid bifurcation without hemodynamically significant stenosis by NASCET criteria. Normal appearance of the internal carotid artery. VERTEBRAL ARTERIES:Atherosclerosis resulting in moderate tandem stenosis RIGHT vertebral artery V1 segment, mild on the LEFT . Codominant, patent vertebral artery's. Mild extrinsic compression vertebral artery due to degenerative cervical spine. SKELETON: No acute osseous process though bone windows have not been submitted. 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 Endplate spurring resulting in severe RIGHT C3-4,  RIGHT C4-5, bilateral C5-6 and bilateral C6-7 neural foraminal narrowing. Multiple dental periapical abscess. OTHER NECK: Soft tissues of the neck are nonacute though, not tailored for evaluation. Multiple thyroid nodules, dominant 16 mm RIGHT thyroid nodule. UPPER CHEST: Included lung apices are clear. No superior mediastinal lymphadenopathy. CTA HEAD ANTERIOR CIRCULATION: Patent cervical internal carotid arteries, petrous, cavernous and supra clinoid internal carotid arteries. Mild atherosclerosis resulting mild stenosis carotid siphon. Patent anterior and middle cerebral arteries. Severe stenosis LEFT A3 origin. 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 Mild tandem stenosis bilateral middle cerebral artery's. No large vessel occlusion, significant stenosis, contrast extravasation. POSTERIOR CIRCULATION: Patent vertebral arteries, vertebrobasilar junction and basilar artery, as well as main branch vessels. Patent posterior cerebral arteries. Small LEFT posterior communicating artery present. Moderate stenosis LEFT P2 segment and LEFT P3 segment. No large vessel occlusion, significant stenosis, contrast extravasation or aneurysm. VENOUS SINUSES: Major dural venous sinuses are patent though not tailored for evaluation on this angiographic examination. ANATOMIC VARIANTS: None. DELAYED PHASE: No abnormal intracranial enhancement. MIP images reviewed. IMPRESSION: CT HEAD: 1. Acute small LEFT frontal white matter infarct better characterized on yesterday's MRI head. 2. Old small LEFT temporal lobe/MCA territory infarct. 3. Mild chronic small vessel ischemic disease. CTA NECK: 1. No hemodynamically significant stenosis carotid artery's. Advanced atherosclerosis. 2. Moderate stenoses RIGHT proximal vertebral artery compatible with atherosclerosis. 3. Multilevel severe neural foraminal narrowing. 4. **An incidental finding of potential clinical significance has been found. 16 mm RIGHT  thyroid nodule. Recommend thyroid ultrasound on nonemergent basis. This follows ACR consensus guidelines: Managing Incidental Thyroid Nodules Detected on Imaging: White Paper of the ACR Incidental Thyroid Findings Committee. J Am Coll Radiol 2015; 12:143-150.** CTA HEAD: 1. No emergent large vessel occlusion. 2. Severe stenosis LEFT  A3 origin. 3. Moderate stenosis bilateral middle and LEFT posterior cerebral artery cerebral artery's compatible with atherosclerosis. 4. 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 Neuro-Interventional Radiology consultation is suggested to evaluate the appropriateness of potential treatment. Non-emergent evaluation can be arranged by calling [phone] during usual hours. Emergency evaluation can be requested by paging [phone]. Electronically Signed   By: Elon Alas M.D.   On: 10/02/2017 02:19   Mr Brain Wo Contrast  Result Date: 10/01/2017 CLINICAL DATA:  RIGHT hand weakness. Follow-up abnormal head CT. History of stroke, hypertension, diabetes. EXAM: MRI HEAD WITHOUT CONTRAST MRA HEAD WITHOUT CONTRAST TECHNIQUE: Multiplanar, multiecho pulse sequences of the brain and surrounding structures were obtained without intravenous contrast. Angiographic images of the head were obtained using MRA technique without contrast. COMPARISON:  CT HEAD October 01, 2017 at 1402 hours and, MRI/MRA head June 25, 2017 FINDINGS: MRI HEAD FINDINGS BRAIN: 10 x 16 mm area reduced diffusion LEFT posterior frontal lobe centrum semiovale to corona radiata with low ADC values and FLAIR T2 hyperintense signal. Multiple old patchy supratentorial white matter T2 hyperintensities, some of which are cystic. LEFT temporal lobe encephalomalacia involving superior gyrus faint susceptibility artifact compatible with mineralization. Chronic microhemorrhage LEFT frontal lobe. Ventricles and sulci are overall normal for patient's age. No midline shift, mass effect or masses. No abnormal extra-axial fluid  collections. VASCULAR: Present, see below. SKULL AND UPPER CERVICAL SPINE: No abnormal sellar expansion. No suspicious calvarial bone marrow signal. Craniocervical junction maintained. SINUSES/ORBITS: Mild RIGHT maxillary sinus mucosal thickening. Mastoid air cells are well aerated. The included ocular globes and orbital contents are non-suspicious. OTHER: 17 mm cystic lesion LEFT parotid gland with T2 shine through. 9 mm probable sebaceous cyst LEFT inferior regular scalp. MRA HEAD FINDINGS ANTERIOR CIRCULATION: Normal flow related enhancement of the included cervical, petrous, cavernous and supraclinoid internal carotid arteries. Patent anterior and middle cerebral arteries, including distal segments. Moderate stenosis proximal LEFT A3 segment. Mild tandem stenoses bilateral anterior and middle cerebral artery's compatible with atherosclerosis. 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 No large vessel occlusion, flow limiting stenosis. POSTERIOR CIRCULATION: Codominant vertebral artery's. Basilar artery is patent, with normal flow related enhancement of the main branch vessels. Patent posterior cerebral arteries. Mild stenosis bilateral proximal P3 segments. Robust LEFT posterior communicating artery present. No large vessel occlusion, flow limiting stenosis,  aneurysm. ANATOMIC VARIANTS: None. Source images and MIP images were reviewed. IMPRESSION: MRI HEAD: 1. Acute 10 x 16 mm LEFT frontal lobe nonhemorrhagic infarct. 2. Mild to moderate chronic small vessel ischemic disease, advanced for age though otherwise stable from prior MRI. 3. Old LEFT temporal lobe/MCA territory infarct. 4. 17 mm nonspecific cyst LEFT parotid gland. MRA HEAD: 1. No emergent large vessel occlusion or flow limiting stenosis. 2. Mild intracranial atherosclerosis. Moderate stenosis LEFT A3 segment. 3. 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 Neuro-Interventional Radiology consultation is  suggested to evaluate the appropriateness of potential treatment. Non-emergent evaluation can be arranged by calling [phone] during usual hours. Emergency evaluation can be requested by paging [phone]. Electronically Signed   By: Elon Alas M.D.   On: 10/01/2017 22:39   Mr Jodene Nam Head Wo Contrast  Result Date: 10/01/2017 CLINICAL DATA:  RIGHT hand weakness. Follow-up abnormal head CT. History of stroke, hypertension, diabetes. EXAM: MRI HEAD WITHOUT CONTRAST MRA HEAD WITHOUT CONTRAST TECHNIQUE: Multiplanar, multiecho pulse sequences of the brain and surrounding structures were obtained without intravenous contrast. Angiographic images of the head were obtained using MRA technique  without contrast. COMPARISON:  CT HEAD October 01, 2017 at 1402 hours and, MRI/MRA head June 25, 2017 FINDINGS: MRI HEAD FINDINGS BRAIN: 10 x 16 mm area reduced diffusion LEFT posterior frontal lobe centrum semiovale to corona radiata with low ADC values and FLAIR T2 hyperintense signal. Multiple old patchy supratentorial white matter T2 hyperintensities, some of which are cystic. LEFT temporal lobe encephalomalacia involving superior gyrus faint susceptibility artifact compatible with mineralization. Chronic microhemorrhage LEFT frontal lobe. Ventricles and sulci are overall normal for patient's age. No midline shift, mass effect or masses. No abnormal extra-axial fluid collections. VASCULAR: Present, see below. SKULL AND UPPER CERVICAL SPINE: No abnormal sellar expansion. No suspicious calvarial bone marrow signal. Craniocervical junction maintained. SINUSES/ORBITS: Mild RIGHT maxillary sinus mucosal thickening. Mastoid air cells are well aerated. The included ocular globes and orbital contents are non-suspicious. OTHER: 17 mm cystic lesion LEFT parotid gland with T2 shine through. 9 mm probable sebaceous cyst LEFT inferior regular scalp. MRA HEAD FINDINGS ANTERIOR CIRCULATION: Normal flow related enhancement of the  included cervical, petrous, cavernous and supraclinoid internal carotid arteries. Patent anterior and middle cerebral arteries, including distal segments. Moderate stenosis proximal LEFT A3 segment. Mild tandem stenoses bilateral anterior and middle cerebral artery's compatible with atherosclerosis. 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 No large vessel occlusion, flow limiting stenosis. POSTERIOR CIRCULATION: Codominant vertebral artery's. Basilar artery is patent, with normal flow related enhancement of the main branch vessels. Patent posterior cerebral arteries. Mild stenosis bilateral proximal P3 segments. Robust LEFT posterior communicating artery present. No large vessel occlusion, flow limiting stenosis,  aneurysm. ANATOMIC VARIANTS: None. Source images and MIP images were reviewed. IMPRESSION: MRI HEAD: 1. Acute 10 x 16 mm LEFT frontal lobe nonhemorrhagic infarct. 2. Mild to moderate chronic small vessel ischemic disease, advanced for age though otherwise stable from prior MRI. 3. Old LEFT temporal lobe/MCA territory infarct. 4. 17 mm nonspecific cyst LEFT parotid gland. MRA HEAD: 1. No emergent large vessel occlusion or flow limiting stenosis. 2. Mild intracranial atherosclerosis. Moderate stenosis LEFT A3 segment. 3. 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 Neuro-Interventional Radiology consultation is suggested to evaluate the appropriateness of potential treatment. Non-emergent evaluation can be arranged by calling [phone] during usual hours. Emergency evaluation can be requested by paging [phone]. Electronically Signed   By: Elon Alas M.D.   On: 10/01/2017 22:39       Subjective: Hand weakness persist but improved.   Discharge Exam: Vitals:   10/02/17 0851 10/02/17 1528  BP: (!) 153/76 127/68  Pulse: 72 82  Resp: 20 20  Temp: 97.9 F (36.6 C) 97.6 F (36.4 C)  SpO2: 98% 98%   Vitals:   10/02/17 0157 10/02/17 0611  10/02/17 0851 10/02/17 1528  BP: (!) 180/101 (!) 159/88 (!) 153/76 127/68  Pulse: 82 75 72 82  Resp: _0 Temp: 98.5 F (36.9 C) 98.6 F (37 C) 97.9 F (36.6 C) 97.6 F (36.4 C)  TempSrc: Oral Oral Oral Oral  SpO2: 100% 99% 98% 98%  Weight:      Height:        General: Pt is alert, awake, not in acute distress Cardiovascular: RRR, S1/S2 +, no rubs, no gallops Respiratory: CTA bilaterally, no wheezing, no rhonchi Abdominal: Soft, NT, ND, bowel sounds + Extremities: no edema, no cyanosis    The results of significant diagnostics from this hospitalization (including imaging, microbiology, ancillary and laboratory) are listed below for reference.     Microbiology:  No results found for this or any previous visit (from the past 240 hour(s)).   Labs: BNP (last 3 results) No results for input(s): BNP in the last 8760 hours. Basic Metabolic Panel:  Recent Labs Lab 10/01/17 1237 10/01/17 1253  NA 138 141  K 3.8 3.8  CL 106 106  CO2 23  --   GLUCOSE 195* 199*  BUN 11 13  CREATININE 0.68 0.60  CALCIUM 10.0  --    Liver Function Tests:  Recent Labs Lab 10/01/17 1237  AST 16  ALT 16  ALKPHOS 45  BILITOT 0.7  PROT 7.1  ALBUMIN 3.9   No results for input(s): LIPASE, AMYLASE in the last 168 hours. No results for input(s): AMMONIA in the last 168 hours. CBC:  Recent Labs Lab 10/01/17 1237 10/01/17 1253  WBC 6.8  --   NEUTROABS 3.7  --   HGB 14.2 14.6  HCT 42.0 43.0  MCV 95.9  --   PLT PLATELET CLUMPS NOTED ON SMEAR, UNABLE TO ESTIMATE  --    Cardiac Enzymes: No results for input(s): CKTOTAL, CKMB, CKMBINDEX, TROPONINI in the last 168 hours. BNP: Invalid input(s): POCBNP CBG:  Recent Labs Lab 10/02/17 0159 10/02/17 0720 10/02/17 1129  GLUCAP 114* 123* 104*   D-Dimer No results for input(s): DDIMER in the last 72 hours. Hgb A1c  Recent Labs  10/01/17 1024  HGBA1C 6.6   Lipid Profile  Recent Labs  10/02/17 0419  CHOL 113  HDL  39*  LDLCALC 61  TRIG 64  CHOLHDL 2.9   Thyroid function studies No results for input(s): TSH, T4TOTAL, T3FREE, THYROIDAB in the last 72 hours.  Invalid input(s): FREET3 Anemia work up No results for input(s): VITAMINB12, FOLATE, FERRITIN, TIBC, IRON, RETICCTPCT in the last 72 hours. Urinalysis    Component Value Date/Time   COLORURINE YELLOW 04/02/2016 0638   APPEARANCEUR CLEAR 04/02/2016 0638   LABSPEC 1.015 04/02/2016 0638   PHURINE 6.0 04/02/2016 0638   GLUCOSEU NEGATIVE 04/02/2016 0638   HGBUR NEGATIVE 04/02/2016 0638   BILIRUBINUR NEGATIVE 04/02/2016 0638   KETONESUR 15 (A) 04/02/2016 0638   PROTEINUR NEGATIVE 04/02/2016 0638   UROBILINOGEN 1.0 05/22/2015 2045   NITRITE NEGATIVE 04/02/2016 0638   LEUKOCYTESUR SMALL (A) 04/02/2016 0638   Sepsis Labs Invalid input(s): PROCALCITONIN,  WBC,  LACTICIDVEN Microbiology No results found for this or any previous visit (from the past 240 hour(s)).   Time coordinating discharge: Over 30 minutes  SIGNED:   Elmarie Shiley, MD  [facility] 10/02/2017, 4:53 PM Pager [phone]  If 7PM-7AM, please contact night-coverage [email or web address] Password [email or web address]

## 2017-10-02 NOTE — H&P (Signed)
History and Physical    Niquita Digioia PJD:[id number] DOB: Aug 01, 1966 DOA: 10/01/2017  PCP: Loletta Specter, PA-C  Patient coming from: Home  I have personally briefly reviewed patient's old medical records in [facility]  Chief Complaint: R hand weakness  HPI: Madissen Wyse is a 51 y.o. female with medical history significant of DM2, HTN, cryptogenic stroke in July of this year s/p loop recorder insertion.  Patient presents to the ED with R hand weakness.  Symptoms onset Wed.  Persistent and constant since onset.  No injury.  No numbness.  Came to ED.  CT head shows hypodensity not clearly present on prior MRI.   ED Course: MRI brain confirms acute ischemic stroke!  A1C is down to 6.6 from 11.1 x3 months ago.  She confirms she is still smoking ~4 cigarettes a day.   Review of Systems: As per HPI otherwise 10 point review of systems negative.   Past Medical History:  Diagnosis Date  . Diabetes mellitus without complication (HCC)   . Hypertension   . Stroke [facility])     Past Surgical History:  Procedure Laterality Date  . LOOP RECORDER INSERTION N/A 06/27/2017   Procedure: Loop Recorder Insertion;  Surgeon: Regan Lemming, MD;  Location: [facility] INVASIVE CV LAB;  Service: Cardiovascular;  Laterality: N/A;  . TEE WITHOUT CARDIOVERSION N/A 06/27/2017   Procedure: TRANSESOPHAGEAL ECHOCARDIOGRAM (TEE);  Surgeon: [name], MD;  Location: [facility] ENDOSCOPY;  Service: Cardiovascular;  Laterality: N/A;     reports that she has been smoking Cigarettes.  She started smoking about 18 years ago. She has been smoking about 0.25 packs per day. She has never used smokeless tobacco. She reports that she does not drink alcohol or use drugs.  Allergies  Allergen Reactions  . Ace Inhibitors Cough    Family History  Problem Relation Age of Onset  . Stroke Maternal Grandfather      Prior to Admission medications   Medication Sig Start Date End Date Taking? Authorizing Provider    acetaminophen (TYLENOL) 325 MG tablet Take 650 mg by mouth every 6 (six) hours as needed for mild pain or moderate pain.     [provider]  amLODipine (NORVASC) 10 MG tablet Take 1 tablet (10 mg total) by mouth daily. TAKE ONE TABLET BY MOUTH ONCE DAILY 07/18/17   Loletta Specter, PA-C  aspirin 325 MG tablet Take 1 tablet (325 mg total) by mouth daily. 07/18/17   Loletta Specter, PA-C  atorvastatin (LIPITOR) 40 MG tablet Take 1 tablet (40 mg total) by mouth at bedtime. Any generic statin from [employer] list is okay 07/18/17   Loletta Specter, PA-C  blood glucose meter kit and supplies KIT Dispense based on patient and insurance preference. Use up to four times daily as directed. (FOR ICD-9 250.00, 250.01). 07/18/17   Loletta Specter, PA-C  carvedilol (COREG) 6.25 MG tablet Take 1 tablet (6.25 mg total) by mouth 2 (two) times daily with a meal. 07/18/17   Loletta Specter, PA-C  glucose blood (RELION GLUCOSE TEST STRIPS) test strip Use as instructed 07/18/17   Loletta Specter, PA-C  hydrALAZINE (APRESOLINE) 10 MG tablet Take 1 tablet (10 mg total) by mouth 3 (three) times daily. 07/18/17 07/18/18  Loletta Specter, PA-C  insulin glargine (LANTUS) 100 UNIT/ML injection Inject 0.2 mLs (20 Units total) into the skin daily. 08/21/17   Quentin Angst, MD  INSULIN SYRINGE 1CC/29G (B-D INSULIN SYRINGE) 29G X 1/2" 1 ML  MISC 1 application by Does not apply route at bedtime. 07/18/17   Clent Demark, PA-C  losartan (COZAAR) 50 MG tablet Take 1 tablet (50 mg total) by mouth daily. 08/20/17   Clent Demark, PA-C  metFORMIN (GLUCOPHAGE XR) 500 MG 24 hr tablet Take 2 tablets (1,000 mg total) by mouth daily with breakfast. 07/18/17   Clent Demark, PA-C    Physical Exam: Vitals:   10/01/17 2045 10/01/17 2100 10/01/17 2115 10/01/17 2130  BP: (!) 153/90 (!) 142/84 (!) 146/86 (!) 158/92  Pulse: 91 82 78 85  Resp: 19 16 (!) 22 (!) 26  Temp:      TempSrc:      SpO2: 99% 99%  98% 100%  Weight:      Height:        Constitutional: NAD, calm, comfortable Eyes: PERRL, lids and conjunctivae normal ENMT: Mucous membranes are moist. Posterior pharynx clear of any exudate or lesions.Normal dentition.  Neck: normal, supple, no masses, no thyromegaly Respiratory: clear to auscultation bilaterally, no wheezing, no crackles. Normal respiratory effort. No accessory muscle use.  Cardiovascular: Regular rate and rhythm, no murmurs / rubs / gallops. No extremity edema. 2+ pedal pulses. No carotid bruits.  Abdomen: no tenderness, no masses palpated. No hepatosplenomegaly. Bowel sounds positive.  Musculoskeletal: no clubbing / cyanosis. No joint deformity upper and lower extremities. Good ROM, no contractures. Normal muscle tone.  Skin: no rashes, lesions, ulcers. No induration Neurologic: Weakness of R hand. Psychiatric: Normal judgment and insight. Alert and oriented x 3. Normal mood.    Labs on Admission: I have personally reviewed following labs and imaging studies  CBC:  Recent Labs Lab 10/01/17 1237 10/01/17 1253  WBC 6.8  --   NEUTROABS 3.7  --   HGB 14.2 14.6  HCT 42.0 43.0  MCV 95.9  --   PLT PLATELET CLUMPS NOTED ON SMEAR, UNABLE TO ESTIMATE  --    Basic Metabolic Panel:  Recent Labs Lab 10/01/17 1237 10/01/17 1253  NA 138 141  K 3.8 3.8  CL 106 106  CO2 23  --   GLUCOSE 195* 199*  BUN 11 13  CREATININE 0.68 0.60  CALCIUM 10.0  --    GFR: Estimated Creatinine Clearance: 74.5 mL/min (by C-G formula based on SCr of 0.6 mg/dL). Liver Function Tests:  Recent Labs Lab 10/01/17 1237  AST 16  ALT 16  ALKPHOS 45  BILITOT 0.7  PROT 7.1  ALBUMIN 3.9   No results for input(s): LIPASE, AMYLASE in the last 168 hours. No results for input(s): AMMONIA in the last 168 hours. Coagulation Profile:  Recent Labs Lab 10/01/17 1237  INR 0.99   Cardiac Enzymes: No results for input(s): CKTOTAL, CKMB, CKMBINDEX, TROPONINI in the last 168  hours. BNP (last 3 results) No results for input(s): PROBNP in the last 8760 hours. HbA1C:  Recent Labs  10/01/17 1024  HGBA1C 6.6   CBG: No results for input(s): GLUCAP in the last 168 hours. Lipid Profile: No results for input(s): CHOL, HDL, LDLCALC, TRIG, CHOLHDL, LDLDIRECT in the last 72 hours. Thyroid Function Tests: No results for input(s): TSH, T4TOTAL, FREET4, T3FREE, THYROIDAB in the last 72 hours. Anemia Panel: No results for input(s): VITAMINB12, FOLATE, FERRITIN, TIBC, IRON, RETICCTPCT in the last 72 hours. Urine analysis:    Component Value Date/Time   COLORURINE YELLOW 04/02/2016 [place] 04/02/2016 0638   LABSPEC 1.015 04/02/2016 0638   PHURINE 6.0 04/02/2016 0638   GLUCOSEU NEGATIVE 04/02/2016  [place] 04/02/2016 0638   BILIRUBINUR NEGATIVE 04/02/2016 0638   KETONESUR 15 (A) 04/02/2016 0638   PROTEINUR NEGATIVE 04/02/2016 0638   UROBILINOGEN 1.0 05/22/2015 2045   NITRITE NEGATIVE 04/02/2016 0638   LEUKOCYTESUR SMALL (A) 04/02/2016 0638    Radiological Exams on Admission: Ct Head Wo Contrast  Result Date: 10/01/2017 CLINICAL DATA:  Right handed weakness for several days EXAM: CT HEAD WITHOUT CONTRAST TECHNIQUE: Contiguous axial images were obtained from the base of the skull through the vertex without intravenous contrast. COMPARISON:  06/25/2017 FINDINGS: Brain: There is an area of decreased attenuation identified on the left in the superior aspect of the temporal lobe consistent with prior infarct. This corresponds with that seen on prior MRI examination. Some scattered decreased attenuation is noted in the left centrum semi ovale also likely related to prior ischemia. No findings to suggest acute hemorrhage, acute infarction or space-occupying mass lesion are noted. Vascular: No hyperdense vessel or unexpected calcification. Skull: Normal. Negative for fracture or focal lesion. Sinuses/Orbits: No acute finding. Other: None.  IMPRESSION: Areas on the left consistent with prior ischemia. The more prominent area is along the superior aspect of the left temporal lobe similar to that seen on the prior MRI examination. The area the deep centrum semiovale on the left is new from that MRI but also likely of a more subacute to chronic nature. Electronically Signed   By: Inez Catalina M.D.   On: 10/01/2017 14:07   Mr Brain Wo Contrast  Result Date: 10/01/2017 CLINICAL DATA:  RIGHT hand weakness. Follow-up abnormal head CT. History of stroke, hypertension, diabetes. EXAM: MRI HEAD WITHOUT CONTRAST MRA HEAD WITHOUT CONTRAST TECHNIQUE: Multiplanar, multiecho pulse sequences of the brain and surrounding structures were obtained without intravenous contrast. Angiographic images of the head were obtained using MRA technique without contrast. COMPARISON:  CT HEAD October 01, 2017 at 1402 hours and, MRI/MRA head June 25, 2017 FINDINGS: MRI HEAD FINDINGS BRAIN: 10 x 16 mm area reduced diffusion LEFT posterior frontal lobe centrum semiovale to corona radiata with low ADC values and FLAIR T2 hyperintense signal. Multiple old patchy supratentorial white matter T2 hyperintensities, some of which are cystic. LEFT temporal lobe encephalomalacia involving superior gyrus faint susceptibility artifact compatible with mineralization. Chronic microhemorrhage LEFT frontal lobe. Ventricles and sulci are overall normal for patient's age. No midline shift, mass effect or masses. No abnormal extra-axial fluid collections. VASCULAR: Present, see below. SKULL AND UPPER CERVICAL SPINE: No abnormal sellar expansion. No suspicious calvarial bone marrow signal. Craniocervical junction maintained. SINUSES/ORBITS: Mild RIGHT maxillary sinus mucosal thickening. Mastoid air cells are well aerated. The included ocular globes and orbital contents are non-suspicious. OTHER: 17 mm cystic lesion LEFT parotid gland with T2 shine through. 9 mm probable sebaceous cyst LEFT inferior  regular scalp. MRA HEAD FINDINGS ANTERIOR CIRCULATION: Normal flow related enhancement of the included cervical, petrous, cavernous and supraclinoid internal carotid arteries. Patent anterior and middle cerebral arteries, including distal segments. Moderate stenosis proximal LEFT A3 segment. Mild tandem stenoses bilateral anterior and middle cerebral artery's compatible with atherosclerosis. 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 No large vessel occlusion, flow limiting stenosis. POSTERIOR CIRCULATION: Codominant vertebral artery's. Basilar artery is patent, with normal flow related enhancement of the main branch vessels. Patent posterior cerebral arteries. Mild stenosis bilateral proximal P3 segments. Robust LEFT posterior communicating artery present. No large vessel occlusion, flow limiting stenosis,  aneurysm. ANATOMIC VARIANTS: None. Source images and MIP images were reviewed. IMPRESSION: MRI  HEAD: 1. Acute 10 x 16 mm LEFT frontal lobe nonhemorrhagic infarct. 2. Mild to moderate chronic small vessel ischemic disease, advanced for age though otherwise stable from prior MRI. 3. Old LEFT temporal lobe/MCA territory infarct. 4. 17 mm nonspecific cyst LEFT parotid gland. MRA HEAD: 1. No emergent large vessel occlusion or flow limiting stenosis. 2. Mild intracranial atherosclerosis. Moderate stenosis LEFT A3 segment. 3. 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 Neuro-Interventional Radiology consultation is suggested to evaluate the appropriateness of potential treatment. Non-emergent evaluation can be arranged by calling [phone] during usual hours. Emergency evaluation can be requested by paging [phone]. Electronically Signed   By: Elon Alas M.D.   On: 10/01/2017 22:39   Mr Jodene Nam Head Wo Contrast  Result Date: 10/01/2017 CLINICAL DATA:  RIGHT hand weakness. Follow-up abnormal head CT. History of stroke, hypertension, diabetes. EXAM: MRI HEAD  WITHOUT CONTRAST MRA HEAD WITHOUT CONTRAST TECHNIQUE: Multiplanar, multiecho pulse sequences of the brain and surrounding structures were obtained without intravenous contrast. Angiographic images of the head were obtained using MRA technique without contrast. COMPARISON:  CT HEAD October 01, 2017 at 1402 hours and, MRI/MRA head June 25, 2017 FINDINGS: MRI HEAD FINDINGS BRAIN: 10 x 16 mm area reduced diffusion LEFT posterior frontal lobe centrum semiovale to corona radiata with low ADC values and FLAIR T2 hyperintense signal. Multiple old patchy supratentorial white matter T2 hyperintensities, some of which are cystic. LEFT temporal lobe encephalomalacia involving superior gyrus faint susceptibility artifact compatible with mineralization. Chronic microhemorrhage LEFT frontal lobe. Ventricles and sulci are overall normal for patient's age. No midline shift, mass effect or masses. No abnormal extra-axial fluid collections. VASCULAR: Present, see below. SKULL AND UPPER CERVICAL SPINE: No abnormal sellar expansion. No suspicious calvarial bone marrow signal. Craniocervical junction maintained. SINUSES/ORBITS: Mild RIGHT maxillary sinus mucosal thickening. Mastoid air cells are well aerated. The included ocular globes and orbital contents are non-suspicious. OTHER: 17 mm cystic lesion LEFT parotid gland with T2 shine through. 9 mm probable sebaceous cyst LEFT inferior regular scalp. MRA HEAD FINDINGS ANTERIOR CIRCULATION: Normal flow related enhancement of the included cervical, petrous, cavernous and supraclinoid internal carotid arteries. Patent anterior and middle cerebral arteries, including distal segments. Moderate stenosis proximal LEFT A3 segment. Mild tandem stenoses bilateral anterior and middle cerebral artery's compatible with atherosclerosis. 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 No large vessel occlusion, flow limiting stenosis. POSTERIOR CIRCULATION: Codominant  vertebral artery's. Basilar artery is patent, with normal flow related enhancement of the main branch vessels. Patent posterior cerebral arteries. Mild stenosis bilateral proximal P3 segments. Robust LEFT posterior communicating artery present. No large vessel occlusion, flow limiting stenosis,  aneurysm. ANATOMIC VARIANTS: None. Source images and MIP images were reviewed. IMPRESSION: MRI HEAD: 1. Acute 10 x 16 mm LEFT frontal lobe nonhemorrhagic infarct. 2. Mild to moderate chronic small vessel ischemic disease, advanced for age though otherwise stable from prior MRI. 3. Old LEFT temporal lobe/MCA territory infarct. 4. 17 mm nonspecific cyst LEFT parotid gland. MRA HEAD: 1. No emergent large vessel occlusion or flow limiting stenosis. 2. Mild intracranial atherosclerosis. Moderate stenosis LEFT A3 segment. 3. 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 Neuro-Interventional Radiology consultation is suggested to evaluate the appropriateness of potential treatment. Non-emergent evaluation can be arranged by calling [phone] during usual hours. Emergency evaluation can be requested by paging [phone]. Electronically Signed   By: Elon Alas M.D.   On: 10/01/2017 22:39    EKG: Independently reviewed.  Assessment/Plan Principal Problem:  Acute ischemic stroke [facility]) Active Problems:   Essential hypertension   DM2 (diabetes mellitus, type 2) ([place])    1. Acute ischemic stroke - 1. Stroke pathway 2. 2d echo ordered 3. Looks like Dr. Leonel Ramsay ordered CTA head and neck 4. Will switch ASA to plavix for now 5. Interrogating loop recorder to look for A.Fib 6. Tele monitor 7. Nicotine patch 2. HTN - continue home meds since out of window for permissive HTN now 3. DM2 - 1. Hold metformin 2. Continue home lantus for now at reduced dose 3. With SSI sensitive scale AC  DVT prophylaxis: Lovenox Code Status: Full Family Communication: Family at bedside Disposition Plan: Home  after admit Consults called: Neuro Admission status: Admit to inpatient   Etta Quill DO [facility] Pager [phone]  If 7AM-7PM, please contact day team taking care of patient [email or web address] Password [email or web address]  10/02/2017, 12:13 AM

## 2017-10-02 NOTE — Evaluation (Signed)
Speech Language Pathology Evaluation Patient Details Name: Tammy Bauer MRN: [id number] DOB: June 01, 1966 Today's Date: 10/02/2017 Time: 7829-5621 SLP Time Calculation (min) (ACUTE ONLY): 19 min  Problem List:  Patient Active Problem List   Diagnosis Date Noted  . Acute ischemic stroke (HCC) 10/02/2017  . DM2 (diabetes mellitus, type 2) (HCC) 10/02/2017  . Acute CVA (cerebrovascular accident) (HCC) 06/25/2017  . Hypertensive emergency 06/25/2017  . Tobacco abuse 06/25/2017  . Essential hypertension 10/07/2015  . Hyperglycemia 10/07/2015   Past Medical History:  Past Medical History:  Diagnosis Date  . Diabetes mellitus without complication (HCC)   . Hypertension   . Stroke [facility])    Past Surgical History:  Past Surgical History:  Procedure Laterality Date  . LOOP RECORDER INSERTION N/A 06/27/2017   Procedure: Loop Recorder Insertion;  Surgeon: Regan Lemming, MD;  Location: [facility] INVASIVE CV LAB;  Service: Cardiovascular;  Laterality: N/A;  . TEE WITHOUT CARDIOVERSION N/A 06/27/2017   Procedure: TRANSESOPHAGEAL ECHOCARDIOGRAM (TEE);  Surgeon: [name], MD;  Location: [facility] ENDOSCOPY;  Service: Cardiovascular;  Laterality: N/A;   HPI:  Tammy Sheltonis a 51 y.o.femalewith medical history significant of DM2, HTN, cryptogenic stroke in July of this year s/p loop recorder insertion. Patient presents to the ED with R hand weakness. Symptoms onset 10/24; persistent and constant since onset. CT head on 10/29 shows hypodensity not clearly present on prior MRI. MRI on 10/29 shows acute left frontal lobe nonhemorrhagic infarct, mild to moderate chronic small vessel ischemic disease, OLD left temporal lobe/MCA territory infarct and nonspecific cyst left parotid gland. SLP evaluation from 06/25/17 showed deficits in executive functions, immediate and delayed recall, thought organization, abstract reasoning and visuoperception with SLP recommendations for f/u with home health or outpatient  SLP tx if needed.    Assessment / Plan / Recommendation Clinical Impression  Pt's speech characterized by mild dysarthria marked by decreased lingual coordination when speaking complex phrases, speaking quickly or completing diadochokinetic tasks; however, speech was intelligible throughout evaluation. Pt's cognition, expressive language and receptive language within normal limits and at baseline level, per pt report. Pt was administered the Cognistat; no errors recorded on all tasks assessed. SLP recommended outpatient or home-health therapy if pt desires at a later time; no f/u SLP intervention needed acutely. Will sign-off.       SLP Assessment  SLP Recommendation/Assessment: Patient does not need any further Speech Lanaguage Pathology Services SLP Visit Diagnosis: Frontal lobe and executive function deficit Frontal lobe and executive function deficit following: Cerebral infarction    Follow Up Recommendations  Outpatient SLP;Other (comment) (If desired by pt)    Frequency and Duration           SLP Evaluation Cognition  Overall Cognitive Status: Within Functional Limits for tasks assessed       Comprehension  Auditory Comprehension Overall Auditory Comprehension: Appears within functional limits for tasks assessed Visual Recognition/Discrimination Discrimination: Not tested Reading Comprehension Reading Status: Not tested    Expression Expression Primary Mode of Expression: Verbal Verbal Expression Overall Verbal Expression: Appears within functional limits for tasks assessed Written Expression Dominant Hand: Right Written Expression: Not tested   Oral / Motor  Oral Motor/Sensory Function Overall Oral Motor/Sensory Function: Mild impairment Facial ROM: Within Functional Limits Facial Symmetry: Within Functional Limits Facial Strength: Within Functional Limits Lingual ROM: Other (Comment) (Discoordination) Lingual Symmetry: Within Functional Limits Lingual Strength:  Within Functional Limits Velum: Within Functional Limits Mandible: Within Functional Limits Motor Speech Overall Motor Speech: Impaired Respiration: Within functional limits Phonation:  Normal Resonance: Within functional limits Articulation: Impaired Level of Impairment: Word Intelligibility: Intelligible Motor Planning: Witnin functional limits Motor Speech Errors: Aware Effective Techniques: Over-articulate   GO                    Carmela RimaAmanda Kirti Carl, Student SLP 10/02/2017, 9:53 AM

## 2017-10-02 NOTE — Evaluation (Signed)
Physical Therapy Evaluation Patient Details Name: Tammy GaskinsMela Bauer MRN: [id number] DOB: May 19, 1966 Today's Date: 10/02/2017   History of Present Illness  51 y.o. female with a history of prior stroke who presents with right hand weakness that started Wednesday  Clinical Impression  Orders received for PT evaluation. Patient demonstrates very modest deficits but is independent with functional mobility. No further acute skilled PT needs at this time. Will sign off.      Follow Up Recommendations No PT follow up    Equipment Recommendations  None recommended by PT    Recommendations for Other Services       Precautions / Restrictions        Mobility  Bed Mobility Overal bed mobility: Independent                Transfers Overall transfer level: Independent Equipment used: None             General transfer comment: no difficulty  Ambulation/Gait Ambulation/Gait assistance: Independent Ambulation Distance (Feet): 360 Feet Assistive device: None Gait Pattern/deviations: WFL(Within Functional Limits)        Stairs Stairs: Yes Stairs assistance: Independent Stair Management: No rails Number of Stairs: 8 General stair comments: no difficulty  Wheelchair Mobility    Modified Rankin (Stroke Patients Only) Modified Rankin (Stroke Patients Only) Pre-Morbid Rankin Score: Moderate disability Modified Rankin: Moderate disability     Balance Overall balance assessment: Independent                                           Pertinent Vitals/Pain Pain Assessment: Faces Faces Pain Scale: No hurt    Home Living Family/patient expects to be discharged to:: Private residence Living Arrangements: Spouse/significant other Available Help at Discharge: Family;Available 24 hours/day Type of Home: House Home Access: Stairs to enter Entrance Stairs-Rails: Left Entrance Stairs-Number of Steps: 2 Home Layout: One level Home Equipment: None       Prior Function Level of Independence: Independent               Hand Dominance   Dominant Hand: Right    Extremity/Trunk Assessment   Upper Extremity Assessment Upper Extremity Assessment: Defer to OT evaluation    Lower Extremity Assessment Lower Extremity Assessment: Overall WFL for tasks assessed       Communication   Communication: No difficulties (mild dysarthric speech)  Cognition Arousal/Alertness: Awake/alert Behavior During Therapy: WFL for tasks assessed/performed Overall Cognitive Status: Within Functional Limits for tasks assessed                                        General Comments      Exercises     Assessment/Plan    PT Assessment Patent does not need any further PT services  PT Problem List         PT Treatment Interventions      PT Goals (Current goals can be found in the Care Plan section)  Acute Rehab PT Goals PT Goal Formulation: All assessment and education complete, DC therapy    Frequency     Barriers to discharge        Co-evaluation               AM-PAC PT "6 Clicks" Daily Activity  Outcome Measure Difficulty turning over in  bed (including adjusting bedclothes, sheets and blankets)?: None Difficulty moving from lying on back to sitting on the side of the bed? : None Difficulty sitting down on and standing up from a chair with arms (e.g., wheelchair, bedside commode, etc,.)?: None Help needed moving to and from a bed to chair (including a wheelchair)?: None Help needed walking in hospital room?: None Help needed climbing 3-5 steps with a railing? : None 6 Click Score: 24    End of Session Equipment Utilized During Treatment: Gait belt Activity Tolerance: Patient tolerated treatment well     PT Visit Diagnosis: Other symptoms and signs involving the nervous system (R29.898)    Time: 1610-9604 PT Time Calculation (min) (ACUTE ONLY): 16 min   Charges:   PT Evaluation $PT Eval Low  Complexity: 1 Low     PT G Codes:   PT G-Codes **NOT FOR INPATIENT CLASS** Functional Assessment Tool Used: Clinical judgement Functional Limitation: Mobility: Walking and moving around Mobility: Walking and Moving Around Current Status (V4098): At least 1 percent but less than 20 percent impaired, limited or restricted Mobility: Walking and Moving Around Goal Status [phone]): At least 1 percent but less than 20 percent impaired, limited or restricted Mobility: Walking and Moving Around Discharge Status [phone]): At least 1 percent but less than 20 percent impaired, limited or restricted    Charlotte Crumb, PT DPT  Board Certified Neurologic Specialist [phone]   [name] 10/02/2017, 10:21 AM

## 2017-10-02 NOTE — ED Notes (Signed)
Patient transported to CT 

## 2017-10-02 NOTE — Progress Notes (Signed)
STROKE TEAM PROGRESS NOTE   SUBJECTIVE (INTERVAL HISTORY) Patient sitting in bed. NAD.   Overall she feels her condition is gradually improving.  Voices no new complaints. No new events reported overnight.   OBJECTIVE  Recent Labs Lab 10/02/17 0159 10/02/17 0720 10/02/17 1129  GLUCAP 114* 123* 104*    Recent Labs Lab 10/01/17 1237 10/01/17 1253  NA 138 141  K 3.8 3.8  CL 106 106  CO2 23  --   GLUCOSE 195* 199*  BUN 11 13  CREATININE 0.68 0.60  CALCIUM 10.0  --     Recent Labs Lab 10/01/17 1237  AST 16  ALT 16  ALKPHOS 45  BILITOT 0.7  PROT 7.1  ALBUMIN 3.9    Recent Labs Lab 10/01/17 1237 10/01/17 1253  WBC 6.8  --   NEUTROABS 3.7  --   HGB 14.2 14.6  HCT 42.0 43.0  MCV 95.9  --   PLT PLATELET CLUMPS NOTED ON SMEAR, UNABLE TO ESTIMATE  --    No results for input(s): CKTOTAL, CKMB, CKMBINDEX, TROPONINI in the last 168 hours.  Recent Labs  10/01/17 1237  LABPROT 13.0  INR 0.99   No results for input(s): COLORURINE, LABSPEC, PHURINE, GLUCOSEU, HGBUR, BILIRUBINUR, KETONESUR, PROTEINUR, UROBILINOGEN, NITRITE, LEUKOCYTESUR in the last 72 hours.  Invalid input(s): APPERANCEUR     Component Value Date/Time   CHOL 113 10/02/2017 0419   TRIG 64 10/02/2017 0419   HDL 39 (L) 10/02/2017 0419   CHOLHDL 2.9 10/02/2017 0419   VLDL 13 10/02/2017 0419   LDLCALC 61 10/02/2017 0419   Lab Results  Component Value Date   HGBA1C 6.6 10/01/2017   No results found for: LABOPIA, COCAINSCRNUR, LABBENZ, AMPHETMU, THCU, LABBARB  No results for input(s): ETH in the last 168 hours.  IMAGING: I have personally reviewed the radiological images below and agree with the radiology interpretations.  Ct Angio Head/Neck W Or Wo Contrast Result Date: 10/02/2017 IMPRESSION: CT HEAD: 1. Acute small LEFT frontal white matter infarct better characterized on yesterday's MRI head. 2. Old small LEFT temporal lobe/MCA territory infarct. 3. Mild chronic small vessel ischemic  disease. CTA NECK: 1. No hemodynamically significant stenosis carotid artery's. Advanced atherosclerosis. 2. Moderate stenoses RIGHT proximal vertebral artery compatible with atherosclerosis. 3. Multilevel severe neural foraminal narrowing. 4. **An incidental finding of potential clinical significance has been found. 16 mm RIGHT thyroid nodule. Recommend thyroid ultrasound on nonemergent basis. This follows ACR consensus guidelines: Managing Incidental Thyroid Nodules Detected on Imaging: White Paper of the ACR Incidental Thyroid Findings Committee. J Am Coll Radiol 2015; 12:143-150.** CTA HEAD: 1. No emergent large vessel occlusion. 2. Severe stenosis LEFT A3 origin. 3. Moderate stenosis bilateral middle and LEFT posterior cerebral artery cerebral artery's compatible with atherosclerosis. 4. 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   Ct Head Wo Contrast Result Date: 10/01/2017 IMPRESSION: Areas on the left consistent with prior ischemia. The more prominent area is along the superior aspect of the left temporal lobe similar to that seen on the prior MRI examination. The area the deep centrum semiovale on the left is new from that MRI but also likely of a more subacute to chronic nature.   Mr Brain Wo Contrast Result Date: 10/01/2017 IMPRESSION: MRI HEAD: 1. Acute 10 x 16 mm LEFT frontal lobe nonhemorrhagic infarct. 2. Mild to moderate chronic small vessel ischemic disease, advanced for age though otherwise stable from prior MRI. 3. Old LEFT temporal lobe/MCA territory infarct. 4. 17 mm nonspecific cyst LEFT parotid  gland. MRA HEAD: 1. No emergent large vessel occlusion or flow limiting stenosis. 2. Mild intracranial atherosclerosis. Moderate stenosis LEFT A3 segment. 3. 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   Mr Maxine Glenn Head Wo Contrast Result Date: 10/01/2017 IMPRESSION: MRI HEAD: 1. Acute 10 x 16 mm LEFT frontal lobe nonhemorrhagic infarct. 2. Mild to moderate chronic small vessel ischemic  disease, advanced for age though otherwise stable from prior MRI. 3. Old LEFT temporal lobe/MCA territory infarct. 4. 17 mm nonspecific cyst LEFT parotid gland. MRA HEAD: 1. No emergent large vessel occlusion or flow limiting stenosis. 2. Mild intracranial atherosclerosis. Moderate stenosis LEFT A3 segment. 3. 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 N    2D Echo:                    PENDING  Renal U/S:                 PENDING  R/O RENAL ARTERY STENOSIS   PHYSICAL EXAM Temp:  [97.8 F (36.6 C)-98.6 F (37 C)] 97.9 F (36.6 C) (10/30 0851) Pulse Rate:  [70-91] 72 (10/30 0851) Resp:  [12-26] 20 (10/30 0851) BP: (140-180)/(76-107) 153/76 (10/30 0851) SpO2:  [97 %-100 %] 98 % (10/30 0851)  General - Well nourished, well developed, in no apparent distress Respiratory - Unlabored breathing noted on exam. Cardiovascular - Regular rate and rhythm   Mental Status -  Level of arousal and orientation to time, place, and person were intact. Language including expression, naming, repetition, comprehension was assessed and found intact. Attention span and concentration were normal Recent and remote memory were intact Fund of Knowledge was assessed and was intact  Cranial Nerves II - XII - II - Visual field intact OU III, IV, VI - Extraocular movements intact. V - Facial sensation intact bilaterally. VII - Facial movement intact bilaterally, slight Right facial droop noted VIII - Hearing & vestibular intact bilaterally X - Palate elevates symmetrically XI - Chin turning & shoulder shrug intact bilaterally. XII - Tongue protrusion intact  Motor Strength - The patient's strength was normal on Left extremities and pronator drift was absent. Right hand weakness slowly improving. Slow fine motor movements. RLE 5/5. Bulk was normal and fasciculations were absent.  Motor Tone - Muscle tone was assessed at the neck and appendages and was normal Reflexes - The patient's reflexes were  symmetrical in all extremities and she had no pathological reflexes Sensory - Light touch was assessed and was symmetrical Coordination - The patient had normal movements in the hands and feet with no ataxia or dysmetria.  Tremor was absent Gait and Station - deferred.   ASSESSMENT AND PLAN: Tammy Bauer is a 51 y.o. female with PMH of prior CVA, HTN, HLD, DM2,        admitted for right hand weakness.   Acute, recurrent, cryptogenic, Left frontal lobe nonhemorrhagic infarct, l   Resultant:   Slight Right facial droop and Right hand weakness   LDL:  61  HgbA1c:  6.6   VTE prophylaxis:  Lovenox  Diet Carb Modified Fluid consistency: Thin; Room service appropriate? Yes    aspirin 325 mg daily prior to admission, now on clopidogrel 75 mg daily and ASA 81 mg daily       based on [place],    Please discharge patient on ASA 81 mg daily and Plavix 75 mg daily for 3 weeks, than d/c ASA and patient will take Plavix alone .  Patient counseled to be compliant with her antithrombotic medications        Therapy recommendations:  NONE       Disposition: HOME, discussed POC with Hospitalists Dr Dow Adolph.        Recommendations:   Ongoing aggressive stroke risk factor management  Continue YOUNG ESUS study follow up.        Follow up Appointments:    PCP follow up   Follow up with [facility], Dr Pearlean Brownie in 6 weeks   R/O AFIB:   Regular loop recorder interrogation  Continue Plavix alone after 3 weeks  R/O Renal Artery Stenosis: Renal U/S pending  Diabetes:  HgbA1c 6.6  goal < 7.0  Controlled  Currently on Novolog /Lantus  CBG monitoring and SSI  DM education and close PCP follow up  Hypertension: Stable, some elevated B/P's noted overnight Permissive hypertension (OK if <220/120) for 24-48 hours post stroke and then gradually normalized within 5-7 days.  Long term BP goal normotensive. May slowly restart home B/P medications after  48 hours with close PCP follow up.  Hyperlipidemia:  Home meds:  Lipitor 40mg   LDL   61, goal < 70  Now on  Lipitor to 40 mg daily  Continue statin at discharge  Other Stroke Risk Factors:  Cigarette smoker, advised to stop smoking - down to 5 per day, Smoking Cessation discussed with patient. Verbalizes importance of stopping.  Hx stroke  Hospital day # 0   Brita Romp Stroke Neurology Team 10/02/2017 2:31 PM I have personally examined this patient, reviewed notes, independently viewed imaging studies, participated in medical decision making and plan of care.ROS completed by me personally and pertinent positives fully documented  I have made any additions or clarifications directly to the above note. Agree with note above. Patient counseled to quit smoking completely and aggressive blood pressure and this factor modification.I spent  35 minutes in total face-to-face time with the patient, more than 50% of which was spent in counseling and coordination of care, reviewing test results, reviewing medication and discussing or reviewing the diagnosis of    Recurrent cryptogenic stroke, the prognosis and treatment options.  Recommend dual antiplatelet therapy for 3 weeks followed by Plavix alone. Follow-up as an outpatient in stroke clinic in 6 weeks and in the [employer]. Neurology to sign-off at this time. Please call with any further questions or concerns. Thank you for this consultation. Delia Heady, MD Medical Director [facility] Pager: [phone] 10/02/2017 2:32 PM    To contact Stroke Continuity provider, please refer to [email or web address]. After hours, contact General Neurology

## 2017-10-02 NOTE — Care Management Note (Signed)
Case Management Note  Patient Details  Name: Temperence Zenor MRN: [id number] Date of Birth: 1966-08-14  Subjective/Objective:  Pt admitted with CVA. She is from home with family.  Pt without insurance.                 Action/Plan: No f/u per PT. OT recommending outpatient therapy. CM met with the patient and her family and she would like to attend [employer]. Orders in EPIC and information on the AVS. Pt is active with [facility] and uses [facility] pharmacy for assistance with her medications.  Husband to provide transportation home.   Expected Discharge Date:                  Expected Discharge Plan:  OP Rehab  In-House Referral:     Discharge planning Services  CM Consult  Post Acute Care Choice:    Choice offered to:     DME Arranged:    DME Agency:     HH Arranged:    [place] Agency:     Status of Service:  Completed, signed off  If discussed at [employer] of Stay Meetings, dates discussed:    Additional Comments:  Pollie Friar, RN 10/02/2017, 4:36 PM

## 2017-10-02 NOTE — Progress Notes (Signed)
Discharge instructions given. Pt verbalized understanding and all questions were answered.  

## 2017-10-02 NOTE — Evaluation (Signed)
Occupational Therapy Evaluation Patient Details Name: Tammy Bauer MRN: [id number] DOB: [birth year] Today's Date: 10/02/2017    History of Present Illness 51 y.o. female with a history of prior stroke who presents with right hand weakness that started Wednesday   Clinical Impression   PTA, pt was living with her husband and was independent. Pt currently performing ADLs and functional mobility at supervision level and using compensatory strategies to perform tasks with RUE. Pt presenting with decreased grasp, pinch, coordination, and strength of RUE (dominant hand). Pt with tendency to not use RUE during ADLs and provided educated on learned non-use; pt verbalizing understanding. Pt would benefit form further acute OT to facilitated safe dc and provided HEP. Recommend dc home with follow up to neuro outpatient OT to increase safety and independence with ADLs and IADLs.    Follow Up Recommendations  Outpatient OT;Supervision - Intermittent (Neuro outpatient OT)    Equipment Recommendations  None recommended by OT    Recommendations for Other Services       Precautions / Restrictions Precautions Precautions: None Restrictions Weight Bearing Restrictions: No      Mobility Bed Mobility Overal bed mobility: Independent                Transfers Overall transfer level: Independent Equipment used: None             General transfer comment: no difficulty    Balance Overall balance assessment: Independent                                         ADL either performed or assessed with clinical judgement   ADL Overall ADL's : Needs assistance/impaired     Grooming: Supervision/safety;Cueing for sequencing;Standing Grooming Details (indicate cue type and reason): Pt with poor funcitonal performance and using LUE only. Encouarged pt to use RUE (dominant hand) for funcitonal tasks. Pt grasping tooth brush between her fingers with R hand.                 Lower Body Dressing Details (indicate cue type and reason): Able to reach down and adjust socks. Decreased use of RUE               General ADL Comments: Pt performing ADLs and funcitonal mobility at supervision level. Pt presenting with decreased functional performance nad use of RUE. Pt with tendency to not use RUE throughout ADLs. Educated pt on learned non-use and importantce of using RUE throughout ADLs. Will return with built up handles and therputty/FM handout     Vision Baseline Vision/History: No visual deficits Patient Visual Report: Diplopia;Other (comment) (double vision from prior stroke) Vision Assessment?: Vision impaired- to be further tested in functional context Additional Comments: Pt with decreased targeted movements due to double vision     Perception     Praxis      Pertinent Vitals/Pain Pain Assessment: Faces Faces Pain Scale: No hurt Pain Intervention(s): Monitored during session     Hand Dominance Right   Extremity/Trunk Assessment Upper Extremity Assessment Upper Extremity Assessment: RUE deficits/detail RUE Deficits / Details: Decreased grasp/pinch strength as wel las FM skills. Pt with poor opposition and undershooting during finger-to-nose test. Pt presenting with wrist flexion during grasping objects. Pt with poor funcitonal use of RUE and hand. Pt using compensatory tehcniques to grasp grooming items.  RUE Coordination: decreased fine motor;decreased gross motor   Lower Extremity Assessment Lower  Extremity Assessment: Overall WFL for tasks assessed   Cervical / Trunk Assessment Cervical / Trunk Assessment: Normal   Communication Communication Communication: No difficulties (mild dysarthric speech)   Cognition Arousal/Alertness: Awake/alert Behavior During Therapy: WFL for tasks assessed/performed Overall Cognitive Status: Within Functional Limits for tasks assessed                                     General  Comments       Exercises     Shoulder Instructions      Home Living Family/patient expects to be discharged to:: Private residence Living Arrangements: Spouse/significant other Available Help at Discharge: Family;Available 24 hours/day Type of Home: House Home Access: Stairs to enter [employer] CorporationEntrance Stairs-Number of Steps: 2 Entrance Stairs-Rails: Left Home Layout: One level     Bathroom Shower/Tub: Chief Strategy OfficerTub/shower unit   Bathroom Toilet: Standard     Home Equipment: None   Additional Comments: Son who is 413 yo  Lives With: Spouse    Prior Functioning/Environment Level of Independence: Independent                 OT Problem List: Decreased range of motion;Decreased activity tolerance;Decreased strength;Decreased coordination;Decreased knowledge of use of DME or AE;Impaired UE functional use      OT Treatment/Interventions: Self-care/ADL training;Therapeutic exercise;Energy conservation;DME and/or AE instruction;Therapeutic activities;Patient/family education    OT Goals(Current goals can be found in the care plan section) Acute Rehab OT Goals Patient Stated Goal: Go home today OT Goal Formulation: With patient Time For Goal Achievement: 10/16/17 Potential to Achieve Goals: Good ADL Goals Pt/caregiver will Perform Home Exercise Program: Increased ROM;Increased strength;Right Upper extremity;With theraputty;With written HEP provided Additional ADL Goal #1: Pt will performing ADLs at Mod I level  OT Frequency: Min 2X/week   Barriers to D/C:            Co-evaluation              AM-PAC PT "6 Clicks" Daily Activity     Outcome Measure Help from another person eating meals?: None Help from another person taking care of personal grooming?: A Little Help from another person toileting, which includes using toliet, bedpan, or urinal?: A Little Help from another person bathing (including washing, rinsing, drying)?: A Little Help from another person to put on and taking  off regular upper body clothing?: None Help from another person to put on and taking off regular lower body clothing?: A Little 6 Click Score: 20   End of Session Nurse Communication: Mobility status  Activity Tolerance: Patient tolerated treatment well Patient left: in bed;with call bell/phone within reach  OT Visit Diagnosis: Hemiplegia and hemiparesis;Other symptoms and signs involving cognitive function Hemiplegia - Right/Left: Right Hemiplegia - dominant/non-dominant: Dominant Hemiplegia - caused by: Cerebral infarction                Time: 1023-1040 OT Time Calculation (min): 17 min Charges:  OT General Charges $OT Visit: 1 Visit OT Evaluation $OT Eval Moderate Complexity: 1 Mod G-Codes:     Menno Vanbergen MSOT, OTR/L Acute Rehab Pager: [phone] Office: [phone]  Theodoro GristCharis M Ebony Yorio 10/02/2017, 2:19 PM

## 2017-10-02 NOTE — Progress Notes (Signed)
Occupational Therapy Treatment Patient Details Name: Tammy GaskinsMela Bauer MRN: [id number] DOB: 03-01-1966 Today's Date: 10/02/2017    History of present illness 51 y.o. female with a history of prior stroke who presents with right hand weakness that started Wednesday   OT comments  Provided pt with theraputty and FM exercises with handout. Pt demonstrating and verbalizing understanding. Reviewed learned non-use and importance of using RUE throughout all functional tasks. Answered all pt and family questions in preparation for dc later today. Continue to recommend follow up at neuro OP OT to optimize functional use of RUE and independence with ADLs. Will continue to follow acutely as admitted to facilitate safe dc.   Follow Up Recommendations  Outpatient OT;Supervision - Intermittent (Neuro outpatient OT)    Equipment Recommendations  None recommended by OT    Recommendations for Other Services      Precautions / Restrictions Precautions Precautions: None Restrictions Weight Bearing Restrictions: No       Mobility Bed Mobility      Focused session on HEP            Transfers      Focused session on HEP                Balance Overall balance assessment: Independent                                         ADL either performed or assessed with clinical judgement   ADL Overall ADL's : Needs assistance/impaired                                       General ADL Comments: Focused session on HEP     Vision   Vision Assessment?: Vision impaired- to be further tested in functional context Additional Comments: Pt with decreased targeted movements due to double vision   Perception     Praxis      Cognition Arousal/Alertness: Awake/alert Behavior During Therapy: WFL for tasks assessed/performed Overall Cognitive Status: Within Functional Limits for tasks assessed                                           Exercises Exercises: Hand exercises;Hand activities;Other exercises Hand Exercises Digit Composite Abduction: AROM;Right;5 reps;Seated;Squeeze ball Digit Composite Adduction: AROM;Right;5 reps;Seated;Squeeze ball Digit Lifts: AROM;Right;5 reps;Seated Thumb Abduction: AROM;Left;5 reps;Seated Thumb Adduction: AROM;Right;5 reps;Seated Other Exercises Other Exercises: Reviewed FM excercises and handout. Pt and husband verbalizing understanding.  Other Exercises: Educated on theraputty excericses and handout. Provided super soft (tan) theraputty. Pt dmeonstarting understanding Other Exercises: Reviewed learned non-use post stroke and importance of funcitonal use of RUE   Shoulder Instructions       General Comments      Pertinent Vitals/ Pain       Pain Assessment: Faces Faces Pain Scale: No hurt Pain Intervention(s): Monitored during session  Home Living                                          Prior Functioning/Environment              Frequency  Min  2X/week        Progress Toward Goals  OT Goals(current goals can now be found in the care plan section)  Progress towards OT goals: Progressing toward goals  Acute Rehab OT Goals Patient Stated Goal: Go home today OT Goal Formulation: With patient Time For Goal Achievement: 10/16/17 Potential to Achieve Goals: Good ADL Goals Pt/caregiver will Perform Home Exercise Program: Increased ROM;Increased strength;Right Upper extremity;With theraputty;With written HEP provided Additional ADL Goal #1: Pt will performing ADLs at Mod I level  Plan Discharge plan remains appropriate    Co-evaluation                 AM-PAC PT "6 Clicks" Daily Activity     Outcome Measure   Help from another person eating meals?: None Help from another person taking care of personal grooming?: A Little Help from another person toileting, which includes using toliet, bedpan, or urinal?: A Little Help from another  person bathing (including washing, rinsing, drying)?: A Little Help from another person to put on and taking off regular upper body clothing?: None Help from another person to put on and taking off regular lower body clothing?: A Little 6 Click Score: 20    End of Session Equipment Utilized During Treatment: Other (comment) (Theraputty)  OT Visit Diagnosis: Hemiplegia and hemiparesis;Other symptoms and signs involving cognitive function Hemiplegia - Right/Left: Right Hemiplegia - dominant/non-dominant: Dominant Hemiplegia - caused by: Cerebral infarction   Activity Tolerance Patient tolerated treatment well   Patient Left in bed;with call bell/phone within reach   Nurse Communication Mobility status        Time: 1435-1500 OT Time Calculation (min): 25 min  Charges: OT General Charges $OT Visit: 1 Visit OT Treatments $Therapeutic Activity: 23-37 mins  Kawena Lyday MSOT, OTR/L Acute Rehab Pager: [phone] Office: [phone]   Theodoro Grist [name] 10/02/2017, 4:50 PM

## 2017-10-02 NOTE — Progress Notes (Signed)
  Echocardiogram 2D Echocardiogram has been performed.  Shona SimpsonLane, Seema Blum F 10/02/2017, 4:38 PM

## 2017-10-03 MED FILL — ?CLOPIDOGREL 75MG TAB: 75 | 30 days supply | Qty: 30 | Fill #0

## 2017-10-05 ENCOUNTER — Other Ambulatory Visit: Payer: Self-pay

## 2017-10-05 NOTE — Patient Outreach (Signed)
[facility] Care Management  10/05/2017  Tammy GaskinsMela Bauer 31-Aug-1966 [id number]     EMMI- STROKE RED ON EMMI ALERT Day # 1 Date: 10/04/17 Red Alert Reason: "Scheduled a follow up appt? No"   Outreach attempt # 1 to patient. Spoke with patient who states she is doing well. Reviewed and addressed red alert with patient. She states that she had PCP f/u appt for four weeks. RN CM reviewed with patient that per discharge papers instructions are to f/u with PCP within 1-2wks. She voices that she will call MD to move appt up. She has not contacted neurologist office to make appt but will do so today. She is awaiting outpatient rehab call to get rehab set up. Patient has rehab contact info on d/c paperwork and will f/u if she has not heard from them in the next few days. She voices that she has all her meds and was only started on two new meds. She denies any questions or concerns regarding her meds. She has supportive family and no issues with transportation. No further RN CM needs or concerns at this time. Advised patient that they would continue to get automated EMMI-Stroke post discharge calls to assess how they are doing following recent hospitalization and will receive a call from a nurse if any of their responses were abnormal. Patient voiced understanding and was appreciative of f/u call.     Plan: RN CM will notify [facility] administrative assistant of case status.  Antionette Fairyoshanda Stockton Nunley, RN,BSN,CCM [facility] Care Management Telephonic Care Management Coordinator Direct Phone: [phone] Toll Free: [phone] Fax: [phone]

## 2017-10-12 ENCOUNTER — Ambulatory Visit (HOSPITAL_COMMUNITY): Payer: Self-pay

## 2017-10-18 MED FILL — ?CARVEDILOL 6.25 MG TABLET: 6.25 | 30 days supply | Qty: 60 | Fill #3

## 2017-10-18 MED FILL — AMLODIPINE BESYLATE 10 MG T: 10 | 30 days supply | Qty: 30 | Fill #3

## 2017-10-29 ENCOUNTER — Ambulatory Visit (INDEPENDENT_AMBULATORY_CARE_PROVIDER_SITE_OTHER): Payer: Self-pay | Admitting: *Deleted

## 2017-10-29 ENCOUNTER — Encounter (INDEPENDENT_AMBULATORY_CARE_PROVIDER_SITE_OTHER): Payer: Self-pay | Admitting: Physician Assistant

## 2017-10-29 ENCOUNTER — Ambulatory Visit (INDEPENDENT_AMBULATORY_CARE_PROVIDER_SITE_OTHER): Payer: Self-pay | Admitting: Physician Assistant

## 2017-10-29 ENCOUNTER — Other Ambulatory Visit: Payer: Self-pay

## 2017-10-29 VITALS — BP 165/98 | HR 77 | Temp 97.9°F | Wt 147.6 lb

## 2017-10-29 DIAGNOSIS — F17200 Nicotine dependence, unspecified, uncomplicated: Secondary | ICD-10-CM

## 2017-10-29 DIAGNOSIS — I639 Cerebral infarction, unspecified: Secondary | ICD-10-CM

## 2017-10-29 DIAGNOSIS — I10 Essential (primary) hypertension: Secondary | ICD-10-CM

## 2017-10-29 DIAGNOSIS — Z09 Encounter for follow-up examination after completed treatment for conditions other than malignant neoplasm: Secondary | ICD-10-CM

## 2017-10-29 MED ORDER — NICOTINE 14 MG/24HR TD PT24: 14.0000 mg | MEDICATED_PATCH | Freq: Every day | TRANSDERMAL | 0 refills | Status: DC

## 2017-10-29 MED ORDER — NICOTINE 7 MG/24HR TD PT24: 7.0000 mg | MEDICATED_PATCH | Freq: Every day | TRANSDERMAL | 0 refills | Status: DC

## 2017-10-29 MED FILL — NICOTINE 14 MG/24HR PATCH: 14 | 28 days supply | Qty: 28 | Fill #0

## 2017-10-29 NOTE — Patient Instructions (Signed)

## 2017-10-29 NOTE — Progress Notes (Signed)
Subjective:  Patient ID: Tammy Bauer, female    DOB: 1966-10-02  Age: 51 y.o. MRN: [id number]  CC: f/u weakness  HPI  Da Sheltonis a 51 y.o.RHD femalewith a medical history of HTN, Cryptogenic stroke, DM2, tobacco abuse, and noncompliance presents on f/u on right hand weakness. She was sent to the ED the same day and diagnosed with acute ischemic stroke. MRI positive for stroke. She was discharged on aspirin and plavix. Referral has been made to neurology and to occupational therapy. Low sodium diet had been recommended with advise to increase activities slowly. Has not been able to see the neurology because of financial reasons. Spoke to occupational therapy and was advised to do home therapy with play-do exercises.  BP noted to be elevated at this visit. Patient states she has not taken any of her anti-hypertensives today. Says blood pressure is normal when she takes her blood pressure at [facility] or [facility]. Continues smoking five cigarettes per day. Says she "needs" to smoke. Has not filled her Nicoderm prescription yet. Denies CP, palpitations, SOB, HA, tingling, numbness, weakness, paralysis, abdominal pain, f/c/n/v, rash, or GI/GU sxs.      Outpatient Medications Prior to Visit  Medication Sig Dispense Refill  . amLODipine (NORVASC) 10 MG tablet Take 1 tablet (10 mg total) by mouth daily. TAKE ONE TABLET BY MOUTH ONCE DAILY 30 tablet 5  . aspirin EC 81 MG EC tablet Take 1 tablet (81 mg total) by mouth daily. 30 tablet 0  . atorvastatin (LIPITOR) 40 MG tablet Take 1 tablet (40 mg total) by mouth at bedtime. Any generic statin from [facility] list is okay 30 tablet 5  . blood glucose meter kit and supplies KIT Dispense based on patient and insurance preference. Use up to four times daily as directed. (FOR ICD-9 250.00, 250.01). 1 each 0  . carvedilol (COREG) 6.25 MG tablet Take 1 tablet (6.25 mg total) by mouth 2 (two) times daily with a meal. 60 tablet 5  . clopidogrel (PLAVIX) 75 MG tablet  Take 1 tablet (75 mg total) by mouth daily. 30 tablet 0  . glucose blood (RELION GLUCOSE TEST STRIPS) test strip Use as instructed 100 each 12  . hydrALAZINE (APRESOLINE) 10 MG tablet Take 1 tablet (10 mg total) by mouth 3 (three) times daily. 90 tablet 5  . insulin glargine (LANTUS) 100 UNIT/ML injection Inject 0.2 mLs (20 Units total) into the skin daily. 10 mL 3  . INSULIN SYRINGE 1CC/29G (B-D INSULIN SYRINGE) 29G X 1/2" 1 ML MISC 1 application by Does not apply route at bedtime. 30 each 5  . losartan (COZAAR) 50 MG tablet Take 1 tablet (50 mg total) by mouth daily. 30 tablet 5  . metFORMIN (GLUCOPHAGE XR) 500 MG 24 hr tablet Take 2 tablets (1,000 mg total) by mouth daily with breakfast. (Patient taking differently: Take 500 mg by mouth 2 (two) times daily. ) 60 tablet 5  . acetaminophen (TYLENOL) 325 MG tablet Take 650 mg by mouth every 6 (six) hours as needed for mild pain or moderate pain.     . nicotine (NICODERM CQ - DOSED IN MG/24 HR) 7 mg/24hr patch Place 1 patch (7 mg total) onto the skin daily. (Patient not taking: Reported on 10/29/2017) 28 patch 0   No facility-administered medications prior to visit.      ROS Review of Systems  Constitutional: Negative for chills, fever and malaise/fatigue.  Eyes: Negative for blurred vision.  Respiratory: Negative for shortness of breath.   Cardiovascular:  Negative for chest pain and palpitations.  Gastrointestinal: Negative for abdominal pain and nausea.  Genitourinary: Negative for dysuria and hematuria.  Musculoskeletal: Negative for joint pain and myalgias.  Skin: Negative for rash.  Neurological: Negative for tingling and headaches.  Psychiatric/Behavioral: Negative for depression. The patient is not nervous/anxious.     Objective:  BP (!) 165/98 (BP Location: Left Arm, Patient Position: Sitting, Cuff Size: Normal)   Pulse 77   Temp 97.9 F (36.6 C) (Oral)   Wt 147 lb 9.6 oz (67 kg)   LMP 10/22/2017 (Approximate)   SpO2 99%    BMI 27.00 kg/m   BP/Weight 10/29/2017 10/02/2017 83/08/4075  Systolic BP 808 811 -  Diastolic BP 98 75 -  Wt. (Lbs) 147.6 - 140  BMI 27 - 25.61      Physical Exam  Constitutional: She is oriented to person, place, and time.  Well developed, well nourished, NAD, polite  HENT:  Head: Normocephalic and atraumatic.  Eyes: EOM are normal. Pupils are equal, round, and reactive to light. No scleral icterus.  Neck: Normal range of motion. Neck supple. No thyromegaly present.  Cardiovascular: Normal rate, regular rhythm and normal heart sounds.  Pulmonary/Chest: Effort normal and breath sounds normal.  Abdominal: Soft. Bowel sounds are normal. There is no tenderness.  Musculoskeletal: She exhibits no edema.  Neurological: She is alert and oriented to person, place, and time. No cranial nerve deficit. Coordination normal.  Strength 5/5 throughout. Light touch sensation intact throughout  Skin: Skin is warm and dry. No rash noted. No erythema. No pallor.  Psychiatric: She has a normal mood and affect. Her behavior is normal. Thought content normal.  Vitals reviewed.    Assessment & Plan:   1. Hospital discharge follow-up - Notes reviewed. Patient asymptomatic today.   2. Cryptogenic stroke ([place]) - Recurrent. Now on Plavix and Aspirin. Neurology appointment set for 01/30/18. Patient will also begin the process of [employer] [employer] application and [employer].   3. Tobacco use disorder - Begin nicotine (NICODERM CQ) 14 mg/24hr patch; Place 1 patch (14 mg total) onto the skin daily.  Dispense: 28 patch; Refill: 0 - Begin nicotine (NICODERM CQ) 7 mg/24hr patch; Place 1 patch (7 mg total) onto the skin daily.  Dispense: 28 patch; Refill: 0  4. Hypertension - Elevated today. Patient has not taken any of her antihypertensive today yet. Strongly advised to take medications everyday. Asked her to return to have BP check, PAP smear, and vaccinations done on the same day.   Meds  ordered this encounter  Medications  . nicotine (NICODERM CQ) 14 mg/24hr patch    Sig: Place 1 patch (14 mg total) onto the skin daily.    Dispense:  28 patch    Refill:  0    Order Specific Question:   Supervising Provider    Answer:   Tresa Garter [id number]  . nicotine (NICODERM CQ) 7 mg/24hr patch    Sig: Place 1 patch (7 mg total) onto the skin daily.    Dispense:  28 patch    Refill:  0    Order Specific Question:   Supervising Provider    Answer:   Tresa Garter [id number]    Follow-up: Return if symptoms worsen or fail to improve, for PAP, vaccines, and BP check.   Clent Demark PA

## 2017-10-30 NOTE — Progress Notes (Signed)
[facility] Summary Report / Loop Recorder 

## 2017-10-31 MED FILL — hydrALAZINE HCL 10 MG TABS: 10 | 30 days supply | Qty: 90 | Fill #3

## 2017-10-31 MED FILL — ?ATORVASTATIN 40MG TABLET: 40 | 30 days supply | Qty: 30 | Fill #3

## 2017-11-05 ENCOUNTER — Telehealth (INDEPENDENT_AMBULATORY_CARE_PROVIDER_SITE_OTHER): Payer: Self-pay | Admitting: Physician Assistant

## 2017-11-05 ENCOUNTER — Other Ambulatory Visit (INDEPENDENT_AMBULATORY_CARE_PROVIDER_SITE_OTHER): Payer: Self-pay | Admitting: Physician Assistant

## 2017-11-05 DIAGNOSIS — Z760 Encounter for issue of repeat prescription: Secondary | ICD-10-CM

## 2017-11-05 MED ORDER — CLOPIDOGREL BISULFATE 75 MG PO TABS: 75.0000 mg | ORAL_TABLET | Freq: Every day | ORAL | 3 refills | Status: DC

## 2017-11-05 MED FILL — ?CLOPIDOGREL 75MG TAB: 75 | 30 days supply | Qty: 30 | Fill #0

## 2017-11-05 NOTE — Telephone Encounter (Signed)
I have sent refill to [facility]. Please let patient know. Thank you for all your help.

## 2017-11-05 NOTE — Telephone Encounter (Signed)
Mrs Tammy Bauer came to the office  Requesting  A refill of Plavix 75 mg she haven't take it since Saturday [facility] . Please, call her thank you

## 2017-11-05 NOTE — Telephone Encounter (Signed)
Patient aware. Tammy Bauer, CMA  

## 2017-11-05 NOTE — Telephone Encounter (Signed)
FWD to PCP. Nayan Proch S Alcus [place], CMA  

## 2017-11-13 LAB — CUP PACEART REMOTE DEVICE CHECK
Implantable Pulse Generator Implant Date: 20180725
MDC IDC SESS DTM: 20181122230915

## 2017-11-26 ENCOUNTER — Ambulatory Visit (INDEPENDENT_AMBULATORY_CARE_PROVIDER_SITE_OTHER): Payer: Self-pay | Admitting: *Deleted

## 2017-11-26 DIAGNOSIS — I639 Cerebral infarction, unspecified: Secondary | ICD-10-CM

## 2017-11-28 NOTE — Progress Notes (Signed)
[facility] Summary Report / Loop Recorder 

## 2017-11-29 MED FILL — hydrALAZINE HCL 10 MG TABS: 10 | 30 days supply | Qty: 90 | Fill #4

## 2017-11-30 MED FILL — ?CLOPIDOGREL 75MG TAB: 75 | 30 days supply | Qty: 30 | Fill #1

## 2017-11-30 MED FILL — LOSARTAN POTASSIUM 50 MG TA: 50 | 30 days supply | Qty: 30 | Fill #1

## 2017-12-07 LAB — CUP PACEART REMOTE DEVICE CHECK
Date Time Interrogation Session: 20181222233831
MDC IDC PG IMPLANT DT: 20180725

## 2017-12-24 ENCOUNTER — Ambulatory Visit (INDEPENDENT_AMBULATORY_CARE_PROVIDER_SITE_OTHER): Payer: Self-pay | Admitting: *Deleted

## 2017-12-24 DIAGNOSIS — I639 Cerebral infarction, unspecified: Secondary | ICD-10-CM

## 2017-12-25 NOTE — Progress Notes (Signed)
[facility] Summary Report / Loop Recorder 

## 2017-12-31 LAB — CUP PACEART REMOTE DEVICE CHECK
Date Time Interrogation Session: 20190122004015
MDC IDC PG IMPLANT DT: 20180725

## 2018-01-02 ENCOUNTER — Observation Stay (HOSPITAL_COMMUNITY)
Admission: EM | Admit: 2018-01-02 | Discharge: 2018-01-03 | Disposition: A | Discharge status: Home / Self Care | Payer: Medicaid Other | Attending: Family Medicine | Admitting: Family Medicine

## 2018-01-02 ENCOUNTER — Encounter (HOSPITAL_COMMUNITY): Payer: Self-pay | Admitting: Emergency Medicine

## 2018-01-02 DIAGNOSIS — Z7902 Long term (current) use of antithrombotics/antiplatelets: Secondary | ICD-10-CM | POA: Insufficient documentation

## 2018-01-02 DIAGNOSIS — Z79899 Other long term (current) drug therapy: Secondary | ICD-10-CM | POA: Diagnosis not present

## 2018-01-02 DIAGNOSIS — I639 Cerebral infarction, unspecified: Secondary | ICD-10-CM | POA: Diagnosis present

## 2018-01-02 DIAGNOSIS — D500 Iron deficiency anemia secondary to blood loss (chronic): Secondary | ICD-10-CM | POA: Insufficient documentation

## 2018-01-02 DIAGNOSIS — Z794 Long term (current) use of insulin: Secondary | ICD-10-CM | POA: Insufficient documentation

## 2018-01-02 DIAGNOSIS — Z8673 Personal history of transient ischemic attack (TIA), and cerebral infarction without residual deficits: Secondary | ICD-10-CM | POA: Diagnosis not present

## 2018-01-02 DIAGNOSIS — N939 Abnormal uterine and vaginal bleeding, unspecified: Principal | ICD-10-CM

## 2018-01-02 DIAGNOSIS — Z7982 Long term (current) use of aspirin: Secondary | ICD-10-CM | POA: Insufficient documentation

## 2018-01-02 DIAGNOSIS — I10 Essential (primary) hypertension: Secondary | ICD-10-CM | POA: Insufficient documentation

## 2018-01-02 DIAGNOSIS — D649 Anemia, unspecified: Secondary | ICD-10-CM | POA: Diagnosis present

## 2018-01-02 DIAGNOSIS — E119 Type 2 diabetes mellitus without complications: Secondary | ICD-10-CM

## 2018-01-02 DIAGNOSIS — Z720 Tobacco use: Secondary | ICD-10-CM | POA: Diagnosis present

## 2018-01-02 DIAGNOSIS — E1159 Type 2 diabetes mellitus with other circulatory complications: Secondary | ICD-10-CM | POA: Diagnosis present

## 2018-01-02 DIAGNOSIS — F17210 Nicotine dependence, cigarettes, uncomplicated: Secondary | ICD-10-CM | POA: Insufficient documentation

## 2018-01-02 DIAGNOSIS — D62 Acute posthemorrhagic anemia: Secondary | ICD-10-CM

## 2018-01-02 LAB — URINALYSIS, ROUTINE W REFLEX MICROSCOPIC
BILIRUBIN URINE: NEGATIVE
Glucose, UA: NEGATIVE mg/dL
Leukocytes, UA: NEGATIVE
NITRITE: NEGATIVE
PH: 6.5 (ref 5.0–8.0)
Protein, ur: >300 mg/dL — AB

## 2018-01-02 LAB — I-STAT CHEM 8, ED
BUN: 15 mg/dL (ref 6–20)
CALCIUM ION: 1.33 mmol/L (ref 1.15–1.40)
CHLORIDE: 105 mmol/L (ref 101–111)
CREATININE: 0.9 mg/dL (ref 0.44–1.00)
GLUCOSE: 268 mg/dL — AB (ref 65–99)
HCT: 30 % — ABNORMAL LOW (ref 36.0–46.0)
Hemoglobin: 10.2 g/dL — ABNORMAL LOW (ref 12.0–15.0)
Potassium: 3.9 mmol/L (ref 3.5–5.1)
Sodium: 141 mmol/L (ref 135–145)
TCO2: 25 mmol/L (ref 22–32)

## 2018-01-02 LAB — URINALYSIS, MICROSCOPIC (REFLEX)

## 2018-01-02 LAB — CBC
HEMATOCRIT: 31.8 % — AB (ref 36.0–46.0)
Hemoglobin: 10.3 g/dL — ABNORMAL LOW (ref 12.0–15.0)
MCH: 31.2 pg (ref 26.0–34.0)
MCHC: 32.4 g/dL (ref 30.0–36.0)
MCV: 96.4 fL (ref 78.0–100.0)
Platelets: 349 10*3/uL (ref 150–400)
RBC: 3.3 MIL/uL — ABNORMAL LOW (ref 3.87–5.11)
RDW: 13.4 % (ref 11.5–15.5)
WBC: 7.1 10*3/uL (ref 4.0–10.5)

## 2018-01-02 LAB — PREGNANCY, URINE: Preg Test, Ur: NEGATIVE

## 2018-01-02 LAB — I-STAT BETA HCG BLOOD, ED (MC, WL, AP ONLY): I-stat hCG, quantitative: <5 m[IU]/mL (ref <5)

## 2018-01-02 MED ORDER — MEGESTROL ACETATE 40 MG PO TABS
80.0000 mg | ORAL_TABLET | Freq: Every day | ORAL | Status: DC
  Administered 2018-01-02: 80 mg via ORAL
  Filled 2018-01-02 (×4): qty 2

## 2018-01-02 NOTE — ED Provider Notes (Signed)
Pt received at sign out. Pt received megace approximately 1 hour ago. Will need further observation, repeat H/H, and re-check for slowing vaginal bleeding. Call back OB/GYN MD prn based on repeat testing/exam. Sign out to Dr. Manus Gunningancour.    Samuel JesterMcManus, Mattilyn Crites, DO 01/02/18 2306

## 2018-01-02 NOTE — ED Triage Notes (Signed)
Pt reports heavy vaginal bleeding with large clots and HA that began yesterday. Pt not on contraception.

## 2018-01-02 NOTE — ED Provider Notes (Signed)
[facility] EMERGENCY DEPARTMENT Provider Note   CSN: [id number] Arrival date & time: 01/02/18  1854     History   Chief Complaint Chief Complaint  Patient presents with  . Vaginal Bleeding    HPI Tammy Bauer is a 52 y.o. female.  HPI  52 year old female, she has a history of stroke, she has a history of diabetes and has a history of being on Plavix as a blood thinner.  She presents stating that today she started having vaginal bleeding as per her normal monthly cycle however it has been extremely heavy with large blood clots and ongoing heavy amounts of bleeding.  She reports it did slow down a little bit during the day but then it became more heavy this evening.  No lightheadedness, no abdominal pain at all, no cramping, no back pain, no shortness of breath, no headache or blurred vision.  The symptoms are persistent, she started taking Plavix 2 months ago  Past Medical History:  Diagnosis Date  . Diabetes mellitus without complication ([place])   . Hypertension   . Stroke [facility])     Patient Active Problem List   Diagnosis Date Noted  . Acute ischemic stroke ([place]) 10/02/2017  . DM2 (diabetes mellitus, type 2) ([place]) 10/02/2017  . Acute CVA (cerebrovascular accident) ([place]) 06/25/2017  . Hypertensive emergency 06/25/2017  . Tobacco abuse 06/25/2017  . Essential hypertension 10/07/2015  . Hyperglycemia 10/07/2015    Past Surgical History:  Procedure Laterality Date  . LOOP RECORDER INSERTION N/A 06/27/2017   Procedure: Loop Recorder Insertion;  Surgeon: Constance Haw, MD;  Location: [facility];  Service: Cardiovascular;  Laterality: N/A;  . TEE WITHOUT CARDIOVERSION N/A 06/27/2017   Procedure: TRANSESOPHAGEAL ECHOCARDIOGRAM (TEE);  Surgeon: Pixie Casino, MD;  Location: [facility] ENDOSCOPY;  Service: Cardiovascular;  Laterality: N/A;    OB History    No data available       Home Medications    Prior to Admission medications   Medication Sig Start Date End  Date Taking? Authorizing Provider  acetaminophen (TYLENOL) 325 MG tablet Take 650 mg by mouth every 6 (six) hours as needed for mild pain or moderate pain.     [provider]  amLODipine (NORVASC) 10 MG tablet Take 1 tablet (10 mg total) by mouth daily. TAKE ONE TABLET BY MOUTH ONCE DAILY 07/18/17   Clent Demark, PA-C  aspirin EC 81 MG EC tablet Take 1 tablet (81 mg total) by mouth daily. 10/03/17   Regalado, Belkys A, MD  atorvastatin (LIPITOR) 40 MG tablet Take 1 tablet (40 mg total) by mouth at bedtime. Any generic statin from [facility] list is okay 07/18/17   Clent Demark, PA-C  blood glucose meter kit and supplies KIT Dispense based on patient and insurance preference. Use up to four times daily as directed. (FOR ICD-9 250.00, 250.01). 07/18/17   Clent Demark, PA-C  carvedilol (COREG) 6.25 MG tablet Take 1 tablet (6.25 mg total) by mouth 2 (two) times daily with a meal. 07/18/17   Clent Demark, PA-C  clopidogrel (PLAVIX) 75 MG tablet Take 1 tablet (75 mg total) by mouth daily. 11/05/17   Clent Demark, PA-C  glucose blood (RELION GLUCOSE TEST STRIPS) test strip Use as instructed 07/18/17   Clent Demark, PA-C  hydrALAZINE (APRESOLINE) 10 MG tablet Take 1 tablet (10 mg total) by mouth 3 (three) times daily. 07/18/17 07/18/18  Clent Demark, PA-C  insulin glargine (LANTUS) 100 UNIT/ML injection Inject  0.2 mLs (20 Units total) into the skin daily. 08/21/17   Tresa Garter, MD  INSULIN SYRINGE 1CC/29G (B-D INSULIN SYRINGE) 29G X 1/2" 1 ML MISC 1 application by Does not apply route at bedtime. 07/18/17   Clent Demark, PA-C  losartan (COZAAR) 50 MG tablet Take 1 tablet (50 mg total) by mouth daily. 08/20/17   Clent Demark, PA-C  metFORMIN (GLUCOPHAGE XR) 500 MG 24 hr tablet Take 2 tablets (1,000 mg total) by mouth daily with breakfast. Patient taking differently: Take 500 mg by mouth 2 (two) times daily.  07/18/17   Clent Demark, PA-C    nicotine (NICODERM CQ) 14 mg/24hr patch Place 1 patch (14 mg total) onto the skin daily. 10/29/17   Clent Demark, PA-C  nicotine (NICODERM CQ) 7 mg/24hr patch Place 1 patch (7 mg total) onto the skin daily. 10/29/17   Clent Demark, PA-C    Family History Family History  Problem Relation Age of Onset  . Stroke Maternal Grandfather     Social History Social History   Tobacco Use  . Smoking status: Current Every Day Smoker    Packs/day: 0.25    Types: Cigarettes    Start date: 08/29/1999  . Smokeless tobacco: Never Used  . Tobacco comment: smoke 5 cigarettes a day  Substance Use Topics  . Alcohol use: No  . Drug use: No     Allergies   Ace inhibitors   Review of Systems Review of Systems  All other systems reviewed and are negative.    Physical Exam Updated Vital Signs BP 136/87   Pulse (!) 141   Temp 97.9 F (36.6 C) (Oral)   Resp 17   Ht '5\' 2"'$  (1.575 m)   Wt 65.8 kg (145 lb)   LMP 01/02/2018   SpO2 100%   BMI 26.52 kg/m   Physical Exam  Constitutional: She appears well-developed and well-nourished. No distress.  HENT:  Head: Normocephalic and atraumatic.  Mouth/Throat: Oropharynx is clear and moist. No oropharyngeal exudate.  Eyes: Conjunctivae and EOM are normal. Pupils are equal, round, and reactive to light. Right eye exhibits no discharge. Left eye exhibits no discharge. No scleral icterus.  Neck: Normal range of motion. Neck supple. No JVD present. No thyromegaly present.  Cardiovascular: Normal rate, regular rhythm, normal heart sounds and intact distal pulses. Exam reveals no gallop and no friction rub.  No murmur heard. Pulmonary/Chest: Effort normal and breath sounds normal. No respiratory distress. She has no wheezes. She has no rales.  Abdominal: Soft. Bowel sounds are normal. She exhibits no distension and no mass. There is no tenderness.  No abdominal tenderness  Genitourinary:  Genitourinary Comments: Large amount of blood in  the vaginal vault with large amount of clot, cervix is open, no injury Chaperone present for exam  Musculoskeletal: Normal range of motion. She exhibits no edema or tenderness.  Lymphadenopathy:    She has no cervical adenopathy.  Neurological: She is alert. Coordination normal.  Skin: Skin is warm and dry. No rash noted. No erythema.  Psychiatric: She has a normal mood and affect. Her behavior is normal.  Nursing note and vitals reviewed.    ED Treatments / Results  Labs (all labs ordered are listed, but only abnormal results are displayed) Labs Reviewed  URINALYSIS, ROUTINE W REFLEX MICROSCOPIC  PREGNANCY, URINE  CBC  I-STAT BETA HCG BLOOD, ED ([facility], [facility], [facility] ONLY)  I-STAT CHEM 8, ED    EKG  EKG Interpretation None  Radiology No results found.  Procedures Procedures (including critical care time)  Medications Ordered in ED Medications - No data to display   Initial Impression / Assessment and Plan / ED Course  I have reviewed the triage vital signs and the nursing notes.  Pertinent labs & imaging results that were available during my care of the patient were reviewed by me and considered in my medical decision making (see chart for details).     Rule out anemia, rule out pregnancy, discussed with GYN as the patient is on Plavix  Mild anemia at 10.3 hemoglobin, no leukocytosis, metabolic panel rather unremarkable except for mild hyperglycemia  Pregnancy test negative  Discussed with Dr. Ihor Dow of the gynecology service who recommends 80 mg of megestrol immediately which may be repeated in 12 hours, observation for a couple hours in the emergency department to see how she does, would potentially need observation if bleeding did not slow down  The patient was informed of her treatment plan including the dose of medicine now, she may need another dose in 12 hours, she will be observed in the emergency department for several hours to make sure that this is  improving and slowing down.  She is also been told that she is to stop her Plavix for 2 days and follow-up with her doctor regarding ongoing treatment given his heavy amount of bleeding if bleeding is to continue very heavily where she is to become tachycardic or have increasing pain  Gynecology should be reconsulted for potential intervention or in person evaluation though at this time it does not appear that she needs that.  Change of shift - care signed out to Oncoming EDP - Dr. Thurnell Garbe - likely will need sign out to 11 PM physician as well due to observatory period  Final Clinical Impressions(s) / ED Diagnoses   Final diagnoses:  Episode of heavy vaginal bleeding  Anemia, blood loss    ED Discharge Orders    None       Noemi Chapel, MD 01/02/18 2158

## 2018-01-03 ENCOUNTER — Encounter (HOSPITAL_COMMUNITY): Payer: Self-pay | Admitting: *Deleted

## 2018-01-03 ENCOUNTER — Observation Stay (HOSPITAL_COMMUNITY): Payer: Medicaid Other

## 2018-01-03 ENCOUNTER — Other Ambulatory Visit: Payer: Self-pay

## 2018-01-03 DIAGNOSIS — N939 Abnormal uterine and vaginal bleeding, unspecified: Secondary | ICD-10-CM

## 2018-01-03 DIAGNOSIS — D649 Anemia, unspecified: Secondary | ICD-10-CM | POA: Diagnosis present

## 2018-01-03 DIAGNOSIS — D62 Acute posthemorrhagic anemia: Secondary | ICD-10-CM

## 2018-01-03 LAB — GLUCOSE, CAPILLARY
GLUCOSE-CAPILLARY: 150 mg/dL — AB (ref 65–99)
GLUCOSE-CAPILLARY: 94 mg/dL (ref 65–99)

## 2018-01-03 LAB — CBC
HCT: 29.3 % — ABNORMAL LOW (ref 36.0–46.0)
HEMOGLOBIN: 9.7 g/dL — AB (ref 12.0–15.0)
MCH: 31.2 pg (ref 26.0–34.0)
MCHC: 33.1 g/dL (ref 30.0–36.0)
MCV: 94.2 fL (ref 78.0–100.0)
Platelets: 267 10*3/uL (ref 150–400)
RBC: 3.11 MIL/uL — AB (ref 3.87–5.11)
RDW: 14 % (ref 11.5–15.5)
WBC: 8 10*3/uL (ref 4.0–10.5)

## 2018-01-03 LAB — ABO/RH: ABO/RH(D): B POS

## 2018-01-03 LAB — PREPARE RBC (CROSSMATCH)

## 2018-01-03 LAB — HEMOGLOBIN AND HEMATOCRIT, BLOOD
HEMATOCRIT: 28.3 % — AB (ref 36.0–46.0)
HEMOGLOBIN: 9.2 g/dL — AB (ref 12.0–15.0)

## 2018-01-03 LAB — PROTIME-INR
INR: 0.96
PROTHROMBIN TIME: 12.7 s (ref 11.4–15.2)

## 2018-01-03 MED ORDER — MEGESTROL ACETATE 40 MG PO TABS
80.0000 mg | ORAL_TABLET | Freq: Once | ORAL | Status: AC
  Administered 2018-01-03: 80 mg via ORAL
  Filled 2018-01-03 (×2): qty 2

## 2018-01-03 MED ORDER — ONDANSETRON HCL 4 MG/2ML IJ SOLN: 4.0000 mg | Freq: Four times a day (QID) | INTRAMUSCULAR | Status: DC | PRN

## 2018-01-03 MED ORDER — ACETAMINOPHEN 650 MG RE SUPP: 650.0000 mg | Freq: Four times a day (QID) | RECTAL | Status: DC | PRN

## 2018-01-03 MED ORDER — MEGESTROL ACETATE 40 MG PO TABS: 120.0000 mg | ORAL_TABLET | Freq: Every day | ORAL | 0 refills | Status: DC

## 2018-01-03 MED ORDER — CARVEDILOL 12.5 MG PO TABS
6.2500 mg | ORAL_TABLET | Freq: Two times a day (BID) | ORAL | Status: DC
  Administered 2018-01-03: 6.25 mg via ORAL
  Filled 2018-01-03: qty 1

## 2018-01-03 MED ORDER — ATORVASTATIN CALCIUM 40 MG PO TABS: 40.0000 mg | ORAL_TABLET | Freq: Every day | ORAL | Status: DC

## 2018-01-03 MED ORDER — INSULIN ASPART 100 UNIT/ML ~~LOC~~ SOLN
0.0000 [IU] | Freq: Three times a day (TID) | SUBCUTANEOUS | Status: DC
  Administered 2018-01-03: 2 [IU] via SUBCUTANEOUS

## 2018-01-03 MED ORDER — SODIUM CHLORIDE 0.9 % IV SOLN: 250.0000 mL | INTRAVENOUS | Status: DC | PRN

## 2018-01-03 MED ORDER — INSULIN GLARGINE 100 UNIT/ML ~~LOC~~ SOLN
20.0000 [IU] | Freq: Every day | SUBCUTANEOUS | Status: DC
  Administered 2018-01-03: 20 [IU] via SUBCUTANEOUS
  Filled 2018-01-03 (×2): qty 0.2

## 2018-01-03 MED ORDER — LOSARTAN POTASSIUM 25 MG PO TABS
50.0000 mg | ORAL_TABLET | Freq: Every day | ORAL | Status: DC
  Administered 2018-01-03: 50 mg via ORAL
  Filled 2018-01-03: qty 2

## 2018-01-03 MED ORDER — AMLODIPINE BESYLATE 5 MG PO TABS
10.0000 mg | ORAL_TABLET | Freq: Every day | ORAL | Status: DC
  Administered 2018-01-03: 10 mg via ORAL
  Filled 2018-01-03: qty 2

## 2018-01-03 MED ORDER — NICOTINE 14 MG/24HR TD PT24
14.0000 mg | MEDICATED_PATCH | Freq: Every day | TRANSDERMAL | Status: DC
  Filled 2018-01-03: qty 1

## 2018-01-03 MED ORDER — METFORMIN HCL ER 500 MG PO TB24
500.0000 mg | ORAL_TABLET | Freq: Two times a day (BID) | ORAL | Status: DC
  Administered 2018-01-03: 500 mg via ORAL
  Filled 2018-01-03: qty 1

## 2018-01-03 MED ORDER — SODIUM CHLORIDE 0.9% FLUSH: 3.0000 mL | INTRAVENOUS | Status: DC | PRN

## 2018-01-03 MED ORDER — INSULIN ASPART 100 UNIT/ML ~~LOC~~ SOLN: 0.0000 [IU] | Freq: Every day | SUBCUTANEOUS | Status: DC

## 2018-01-03 MED ORDER — SODIUM CHLORIDE 0.9% FLUSH: 3.0000 mL | Freq: Two times a day (BID) | INTRAVENOUS | Status: DC

## 2018-01-03 MED ORDER — METFORMIN HCL ER 500 MG PO TB24: 500.0000 mg | ORAL_TABLET | Freq: Two times a day (BID) | ORAL | Status: DC

## 2018-01-03 MED ORDER — SODIUM CHLORIDE 0.9 % IV SOLN: 10.0000 mL/h | Freq: Once | INTRAVENOUS | Status: DC

## 2018-01-03 MED ORDER — HYDRALAZINE HCL 10 MG PO TABS
10.0000 mg | ORAL_TABLET | Freq: Three times a day (TID) | ORAL | Status: DC
  Administered 2018-01-03: 10 mg via ORAL
  Filled 2018-01-03: qty 1

## 2018-01-03 MED ORDER — ACETAMINOPHEN 325 MG PO TABS: 650.0000 mg | ORAL_TABLET | Freq: Four times a day (QID) | ORAL | Status: DC | PRN

## 2018-01-03 MED ORDER — ONDANSETRON HCL 4 MG PO TABS: 4.0000 mg | ORAL_TABLET | Freq: Four times a day (QID) | ORAL | Status: DC | PRN

## 2018-01-03 MED ORDER — MEGESTROL ACETATE 40 MG PO TABS
120.0000 mg | ORAL_TABLET | Freq: Every day | ORAL | Status: DC
  Administered 2018-01-03: 120 mg via ORAL
  Filled 2018-01-03 (×2): qty 3

## 2018-01-03 MED ORDER — INFLUENZA VAC SPLIT QUAD 0.5 ML IM SUSY: 0.5000 mL | PREFILLED_SYRINGE | INTRAMUSCULAR | Status: DC

## 2018-01-03 NOTE — Discharge Summary (Addendum)
Physician Discharge Summary  [name] YNW:[id number] DOB: 03/27/1966 DOA: 01/02/2018  PCP: Clent Demark, PA-C Gynecologist:  Pt scheduled to follow up with Dr. Levin [place], [place]  Admit date: 01/02/2018 Discharge date: 01/03/2018  Admitted From: Home  Disposition: Home  Recommendations for Outpatient Follow-up:  1. Follow up with PCP in 1 weeks 2. Please obtain BMP/CBC in one week  Discharge Condition: STABLE   CODE STATUS: FULL    Brief Hospitalization Summary: Please see all hospital notes, images, labs for full details of the hospitalization.  HPI: Tammy Bauer is a 52 y.o. female with medical history significant for CVA-started on Plavix 2 months ago, hypertension, and diabetes who presented to the emergency department on account of significant vaginal bleeding with large blood clots that began early in the morning yesterday.  She denies any abdominal pain, cramping, chest pain, shortness of breath, or any other symptomatology.  No fever or chills noted.  She denies any aggravating or alleviating factors.   ED Course: Gynecologist Dr. Harolyn Rutherford was contacted and recommended 8m of Megestrol immediately with improvement noted thus far.  Vital signs are stable and orders have been given for a repeat dose of Megestrol at 3 AM along with a 2U PRBC transfusion which the patient is now agreeable to. Baseline Hgb of 14.6 noted 3 months ago and is currently 9.2.   1. Profuse vaginal bleeding secondary to dual antiplatelet treatment regimen.  Appreciate gynecology recommendations with administration of Megace.  Dr. EElonda Huskywas consulted and recommended Megace 120 mg daily and to follow up with him in office on 01/08/18.  Appt was made for patient at 10:45am.  Dr. EElonda Huskyordered a vaginal UKoreastudy to be done while in hospital before discharge and will follow up when patient comes to office next week.  Vaginal bleeding stopped with megace and patient felt much better and felt well to go home.  He  said it was ok to resume the plavix.  Holding the aspirin until patient follows up with him next week in the office.  Pt may need an ablation procedure done.  2. Acute blood loss anemia secondary to above.  s/p  transfusion of 1 unit PRBCs. 3. Type 2 diabetes with hyperglycemia. Resume home treatment plan.  Carb modified diet. 4. Hypertension.  Continue home blood pressure agents and monitor. 5. Prior CVA.  Hold aspirin and Plavix and continue statin. 6. Tobacco abuse.  Nicotine patch.  Smoking cessation counseling given and patient verbalized understanding of the risks, dangers of ongoing tobacco use.  DVT prophylaxis: SCDs Code Status: Full Family Communication: patient Disposition Plan:Home Consults: Dr. EElonda Husky Discharge Diagnoses:  Principal Problem:   Vaginal bleeding Active Problems:   Essential hypertension   [place] [place]   Tobacco abuse   [facility]   Acute blood loss anemia   Anemia  Discharge Instructions: Discharge Instructions    Call MD for:  extreme fatigue   Complete by:  As directed    Call MD for:  persistant dizziness or light-headedness   Complete by:  As directed    Increase activity slowly   Complete by:  As directed      Allergies as of 01/03/2018      Reactions   Ace Inhibitors Cough      Medication List    STOP taking these medications   aspirin 81 MG EC tablet   nicotine 14 mg/24hr patch Commonly known as:  NICODERM CQ  nicotine 7 mg/24hr patch Commonly known as:  NICODERM CQ     TAKE these medications   acetaminophen 325 MG tablet Commonly known as:  TYLENOL Take 650 mg by mouth every 6 (six) hours as needed for mild pain or moderate pain.   amLODipine 10 MG tablet Commonly known as:  NORVASC Take 1 tablet (10 mg total) by mouth daily. TAKE ONE TABLET BY MOUTH ONCE DAILY   atorvastatin 40 MG tablet Commonly known as:  LIPITOR Take 1 tablet (40 mg total) by mouth at  bedtime. Any generic statin from [employer] list is okay   blood glucose meter kit and supplies Kit Dispense based on patient and insurance preference. Use up to four times daily as directed. (FOR ICD-9 250.00, 250.01).   carvedilol 6.25 MG tablet Commonly known as:  COREG Take 1 tablet (6.25 mg total) by mouth 2 (two) times daily with a meal.   clopidogrel 75 MG tablet Commonly known as:  PLAVIX Take 1 tablet (75 mg total) by mouth daily.   glucose blood test strip Commonly known as:  RELION GLUCOSE TEST STRIPS Use as instructed   hydrALAZINE 10 MG tablet Commonly known as:  APRESOLINE Take 1 tablet (10 mg total) by mouth 3 (three) times daily.   insulin glargine 100 UNIT/ML injection Commonly known as:  LANTUS Inject 0.2 mLs (20 Units total) into the skin daily.   INSULIN SYRINGE 1CC/29G 29G X 1/2" 1 ML Misc Commonly known as:  B-D INSULIN SYRINGE 1 application by Does not apply route at bedtime.   losartan 50 MG tablet Commonly known as:  COZAAR Take 1 tablet (50 mg total) by mouth daily.   megestrol 40 MG tablet Commonly known as:  MEGACE Take 3 tablets (120 mg total) by mouth daily for 7 days. Start taking on:  01/04/2018   metFORMIN 500 MG 24 hr tablet Commonly known as:  GLUCOPHAGE XR Take 1 tablet (500 mg total) by mouth 2 (two) times daily.      Follow-up Information    Florian Buff, MD Follow up on 01/08/2018.   Specialties:  Obstetrics and Gynecology, Radiology Why:  at 10:45 am Contact information: [place] [address] [phone]        Clent Demark, PA-C. Schedule an appointment as soon as possible for a visit in 10 day(s).   Specialty:  Physician Assistant Why:  Hospital Follow Up  Contact information: [address] [place] [address] [phone]          Allergies  Allergen Reactions  . Ace Inhibitors Cough   Allergies as of 01/03/2018      Reactions   Ace Inhibitors Cough      Medication List     STOP taking these medications   aspirin 81 MG EC tablet   nicotine 14 mg/24hr patch Commonly known as:  NICODERM CQ   nicotine 7 mg/24hr patch Commonly known as:  NICODERM CQ     TAKE these medications   acetaminophen 325 MG tablet Commonly known as:  TYLENOL Take 650 mg by mouth every 6 (six) hours as needed for mild pain or moderate pain.   amLODipine 10 MG tablet Commonly known as:  NORVASC Take 1 tablet (10 mg total) by mouth daily. TAKE ONE TABLET BY MOUTH ONCE DAILY   atorvastatin 40 MG tablet Commonly known as:  LIPITOR Take 1 tablet (40 mg total) by mouth at bedtime. Any generic statin from [employer] list is okay  blood glucose meter kit and supplies Kit Dispense based on patient and insurance preference. Use up to four times daily as directed. (FOR ICD-9 250.00, 250.01).   carvedilol 6.25 MG tablet Commonly known as:  COREG Take 1 tablet (6.25 mg total) by mouth 2 (two) times daily with a meal.   clopidogrel 75 MG tablet Commonly known as:  PLAVIX Take 1 tablet (75 mg total) by mouth daily.   glucose blood test strip Commonly known as:  RELION GLUCOSE TEST STRIPS Use as instructed   hydrALAZINE 10 MG tablet Commonly known as:  APRESOLINE Take 1 tablet (10 mg total) by mouth 3 (three) times daily.   insulin glargine 100 UNIT/ML injection Commonly known as:  LANTUS Inject 0.2 mLs (20 Units total) into the skin daily.   INSULIN SYRINGE 1CC/29G 29G X 1/2" 1 ML Misc Commonly known as:  B-D INSULIN SYRINGE 1 application by Does not apply route at bedtime.   losartan 50 MG tablet Commonly known as:  COZAAR Take 1 tablet (50 mg total) by mouth daily.   megestrol 40 MG tablet Commonly known as:  MEGACE Take 3 tablets (120 mg total) by mouth daily for 7 days. Start taking on:  01/04/2018   metFORMIN 500 MG 24 hr tablet Commonly known as:  GLUCOPHAGE XR Take 1 tablet (500 mg total) by mouth 2 (two) times daily.       Procedures/Studies: US Pelvic  Complete With Transvaginal  Result Date: 01/03/2018 CLINICAL DATA:  Heavy vaginal bleeding.  Anemia.  LMP 01/02/2018. EXAM: TRANSABDOMINAL AND TRANSVAGINAL ULTRASOUND OF PELVIS TECHNIQUE: Both transabdominal and transvaginal ultrasound examinations of the pelvis were performed. Transabdominal technique was performed for global imaging of the pelvis including uterus, ovaries, adnexal regions, and pelvic cul-de-sac. It was necessary to proceed with endovaginal exam following the transabdominal exam to visualize the endometrium and ovaries. COMPARISON:  None FINDINGS: Uterus Measurements: 10.8 x 6.6 x 6.3 cm. A subserosal fibroid is seen in the right uterine corpus measuring 3.7 cm. A partially calcified fibroid is seen in the left uterine corpus which measures 1.3 cm. Endometrium Thickness: 18 mm.  Heterogeneous appearance of endometrium is noted. Right ovary Measurements: 3.5 x 2.5 x 2.4 cm. Normal appearance/no adnexal mass. Left ovary Measurements: 2.5 x 2.5 x 2.4 cm. Normal appearance/no adnexal mass. Other findings No abnormal free fluid. IMPRESSION: Small uterine fibroids, largest measuring 3.7 cm. Thickened and heterogeneous endometrium measuring 18 mm. If bleeding remains unresponsive to hormonal or medical therapy, focal lesion work-up with sonohysterogram should be considered. Endometrial biopsy should also be considered in pre-menopausal patients at high risk for endometrial carcinoma. (Ref: Radiological Reasoning: Algorithmic Workup of Abnormal Vaginal Bleeding with Endovaginal Sonography and Sonohysterography. AJR 2008; 485:I62-70). Normal appearance of both ovaries. Electronically Signed   By: Earle Gell M.D.   On: 01/03/2018 12:17     Subjective: Pt reports that she feels much better after transfusion and vaginal bleeding has stopped.    Discharge Exam: Vitals:   01/03/18 0443 01/03/18 0725  BP: 121/76 139/78  Pulse: (!) 110 (!) 102  Resp: 20 20  Temp: 98.6 F (37 C) 98.6 F (37 C)   SpO2: 100% 100%   Vitals:   01/03/18 0342 01/03/18 0428 01/03/18 0443 01/03/18 0725  BP:  130/80 121/76 139/78  Pulse:  (!) 110 (!) 110 (!) 102  Resp:  _0 Temp:  98.3 F (36.8 C) 98.6 F (37 C) 98.6 F (37 C)  TempSrc:  Oral Oral Oral  SpO2:  100% 100% 100%  Weight: 66.5 kg (146 lb 9.7 oz)     Height: 5' 2" (1.575 m)      General: Pt is alert, awake, not in acute distress Cardiovascular: normal S1/S2 +, no rubs, no gallops Respiratory: CTA bilaterally, no wheezing, no rhonchi Abdominal: Soft, NT, ND, bowel sounds + Extremities: no edema, no cyanosis   The results of significant diagnostics from this hospitalization (including imaging, microbiology, ancillary and laboratory) are listed below for reference.     Microbiology: No results found for this or any previous visit (from the past 240 hour(s)).   Labs: BNP (last 3 results) No results for input(s): BNP in the last 8760 hours. Basic Metabolic Panel: Recent Labs  Lab 01/02/18 2052  NA 141  K 3.9  CL 105  GLUCOSE 268*  BUN 15  CREATININE 0.90   Liver Function Tests: No results for input(s): AST, ALT, ALKPHOS, BILITOT, PROT, ALBUMIN in the last 168 hours. No results for input(s): LIPASE, AMYLASE in the last 168 hours. No results for input(s): AMMONIA in the last 168 hours. CBC: Recent Labs  Lab 01/02/18 2026 01/02/18 2052 01/03/18 0203 01/03/18 0949  WBC 7.1  --   --  8.0  HGB 10.3* 10.2* 9.2* 9.7*  HCT 31.8* 30.0* 28.3* 29.3*  MCV 96.4  --   --  94.2  PLT 349  --   --  267   Cardiac Enzymes: No results for input(s): CKTOTAL, CKMB, CKMBINDEX, TROPONINI in the last 168 hours. BNP: Invalid input(s): POCBNP CBG: Recent Labs  Lab 01/03/18 0809 01/03/18 1155  GLUCAP 150* 94   D-Dimer No results for input(s): DDIMER in the last 72 hours. Hgb A1c No results for input(s): HGBA1C in the last 72 hours. Lipid Profile No results for input(s): CHOL, HDL, LDLCALC, TRIG, CHOLHDL, LDLDIRECT in the  last 72 hours. Thyroid function studies No results for input(s): TSH, T4TOTAL, T3FREE, THYROIDAB in the last 72 hours.  Invalid input(s): FREET3 Anemia work up No results for input(s): VITAMINB12, FOLATE, FERRITIN, TIBC, IRON, RETICCTPCT in the last 72 hours. Urinalysis    Component Value Date/Time   COLORURINE RED (A) 01/02/2018 2026   APPEARANCEUR TURBID (A) 01/02/2018 2026   LABSPEC >1.030 (H) 01/02/2018 2026   PHURINE 6.5 01/02/2018 2026   GLUCOSEU NEGATIVE 01/02/2018 2026   HGBUR LARGE (A) 01/02/2018 2026   BILIRUBINUR NEGATIVE 01/02/2018 2026   KETONESUR TRACE (A) 01/02/2018 2026   PROTEINUR >300 (A) 01/02/2018 2026   UROBILINOGEN 1.0 05/22/2015 2045   NITRITE NEGATIVE 01/02/2018 2026   LEUKOCYTESUR NEGATIVE 01/02/2018 2026   Sepsis Labs Invalid input(s): PROCALCITONIN,  WBC,  LACTICIDVEN Microbiology No results found for this or any previous visit (from the past 240 hour(s)).  Time coordinating discharge:   SIGNED:  Irwin Brakeman, MD  [facility] 01/03/2018, 12:40 PM Pager [phone]  If 7PM-7AM, please contact night-coverage [email or web address] Password [email or web address]

## 2018-01-03 NOTE — ED Provider Notes (Signed)
On recheck, bleeding has slowed substantially.  Patient remains tachycardic in the 100-110 range.  Blood pressure 132/97.  Discussed with Dr. Macon LargeAnyanwu gynecology.  She recommends repeating Megace around 3 AM.   Repeat hemoglobin is 9.2 which is about 5 g lower than in 2018.  Patient agreeable to blood transfusion which is ordered. Dr. Macon LargeAnyanwu feels [facility] and [facility]  D/w Dr. Sherryll BurgerShah. Patient will need GYN consult in the AM.  CRITICAL CARE Performed by: Glynn OctaveANCOUR, Egbert Seidel Total critical care time: 33 minutes Critical care time was exclusive of separately billable procedures and treating other patients. Critical care was necessary to treat or prevent imminent or life-threatening deterioration. Critical care was time spent personally by me on the following activities: development of treatment plan with patient and/or surrogate as well as nursing, discussions with consultants, evaluation of patient's response to treatment, examination of patient, obtaining history from patient or surrogate, ordering and performing treatments and interventions, ordering and review of laboratory studies, ordering and review of radiographic studies, pulse oximetry and re-evaluation of patient's condition.    Glynn Octaveancour, Nandita Mathenia, MD 01/03/18 [phone]

## 2018-01-03 NOTE — Discharge Instructions (Signed)
Please follow-up with your gynecologist on 01/08/18 at 10:45 am as scheduled  Please continue plavix but stop taking the aspirin until you have followed up with the gynecologist next week.  Follow up on results of ultrasound with gynecologist.    Please return to the emergency department for increasing pain bleeding lightheadedness or difficulty breathing    Follow with Dr. Elonda Husky (gynecologist) and Primary MD  Clent Demark, PA-C  and other consultants as instructed your Hospitalist MD  Please get a complete blood count and chemistry panel checked by your Primary MD at your next visit, and again as instructed by your Primary MD.  Get Medicines reviewed and adjusted: Please take all your medications with you for your next visit with your Primary MD  Laboratory/radiological data: Please request your Primary MD to go over all hospital tests and procedure/radiological results at the follow up, please ask your Primary MD to get all Hospital records sent to his/her office.  In some cases, they will be blood work, cultures and biopsy results pending at the time of your discharge. Please request that your primary care M.D. follows up on these results.  Also Note the following: If you experience worsening of your admission symptoms, develop shortness of breath, life threatening emergency, suicidal or homicidal thoughts you must seek medical attention immediately by calling 911 or calling your MD immediately  if symptoms less severe.  You must read complete instructions/literature along with all the possible adverse reactions/side effects for all the Medicines you take and that have been prescribed to you. Take any new Medicines after you have completely understood and accpet all the possible adverse reactions/side effects.   Do not drive when taking Pain medications or sleeping medications (Benzodaizepines)  Do not take more than prescribed Pain, Sleep and Anxiety Medications. It is not  advisable to combine anxiety,sleep and pain medications without talking with your primary care practitioner  Special Instructions: If you have smoked or chewed Tobacco  in the last 2 yrs please stop smoking, stop any regular Alcohol  and or any Recreational drug use.  Wear Seat belts while driving.  Please note: You were cared for by a hospitalist during your hospital stay. Once you are discharged, your primary care physician will handle any further medical issues. Please note that NO REFILLS for any discharge medications will be authorized once you are discharged, as it is imperative that you return to your primary care physician (or establish a relationship with a primary care physician if you do not have one) for your post hospital discharge needs so that they can reassess your need for medications and monitor your lab values.      Abnormal Uterine Bleeding Abnormal uterine bleeding can affect women at various stages in life, including teenagers, women in their reproductive years, pregnant women, and women who have reached menopause. Several kinds of uterine bleeding are considered abnormal, including:  Bleeding or spotting between periods.  Bleeding after sexual intercourse.  Bleeding that is heavier or more than normal.  Periods that last longer than usual.  Bleeding after menopause.  Many cases of abnormal uterine bleeding are minor and simple to treat, while others are more serious. Any type of abnormal bleeding should be evaluated by your health care provider. Treatment will depend on the cause of the bleeding. Follow these instructions at home: Monitor your condition for any changes. The following actions may help to alleviate any discomfort you are experiencing:  Avoid the use of tampons and douches  as directed by your health care provider.  Change your pads frequently.  You should get regular pelvic exams and Pap tests. Keep all follow-up appointments for diagnostic tests  as directed by your health care provider. Contact a health care provider if:  Your bleeding lasts more than 1 week.  You feel dizzy at times. Get help right away if:  You pass out.  You are changing pads every 15 to 30 minutes.  You have abdominal pain.  You have a fever.  You become sweaty or weak.  You are passing large blood clots from the vagina.  You start to feel nauseous and vomit. This information is not intended to replace advice given to you by your health care provider. Make sure you discuss any questions you have with your health care provider. Document Released: 11/20/2005 Document Revised: 05/03/2016 Document Reviewed: 06/19/2013 Elsevier Interactive Patient Education  2017 [employer]     Dysfunctional Uterine Bleeding Dysfunctional uterine bleeding is abnormal bleeding from the uterus. Dysfunctional uterine bleeding includes:  A period that comes earlier or later than usual.  A period that is lighter, heavier, or has blood clots.  Bleeding between periods.  Skipping one or more periods.  Bleeding after sexual intercourse.  Bleeding after menopause.  Follow these instructions at home: Pay attention to any changes in your symptoms. Follow these instructions to help with your condition: Eating and drinking  Eat well-balanced meals. Include foods that are high in iron, such as liver, meat, shellfish, green leafy vegetables, and eggs.  If you become constipated: ? Drink plenty of water. ? Eat fruits and vegetables that are high in water and fiber, such as spinach, carrots, raspberries, apples, and mango. Medicines  Take over-the-counter and prescription medicines only as told by your health care provider.  Do not change medicines without talking with your health care provider.  Aspirin or medicines that contain aspirin may make the bleeding worse. Do not take those medicines: ? During the week before your period. ? During your period.  If  you were prescribed iron pills, take them as told by your health care provider. Iron pills help to replace iron that your body loses because of this condition. Activity  If you need to change your sanitary pad or tampon more than one time every 2 hours: ? Lie in bed with your feet raised (elevated). ? Place a cold pack on your lower abdomen. ? Rest as much as possible until the bleeding stops or slows down.  Do not try to lose weight until the bleeding has stopped and your blood iron level is back to normal. Other Instructions  For two months, write down: ? When your period starts. ? When your period ends. ? When any abnormal bleeding occurs. ? What problems you notice.  Keep all follow up visits as told by your health care provider. This is important. Contact a health care provider if:  You get light-headed or weak.  You have nausea and vomiting.  You cannot eat or drink without vomiting.  You feel dizzy or have diarrhea while you are taking medicines.  You are taking birth control pills or hormones, and you want to change them or stop taking them. Get help right away if:  You develop a fever or chills.  You need to change your sanitary pad or tampon more than one time per hour.  Your bleeding becomes heavier, or your flow contains clots more often.  You develop pain in your abdomen.  You  lose consciousness.  You develop a rash. This information is not intended to replace advice given to you by your health care provider. Make sure you discuss any questions you have with your health care provider. Document Released: 11/17/2000 Document Revised: 04/27/2016 Document Reviewed: 02/15/2015 Elsevier Interactive Patient Education  2018 [employer].     Iron Deficiency Anemia, Adult Iron-deficiency anemia is when you have a low amount of red blood cells or hemoglobin. This happens because you have too little iron in your body. Hemoglobin carries oxygen to parts of the  body. Anemia can cause your body to not get enough oxygen. It may or may not cause symptoms. Follow these instructions at home: Medicines  Take over-the-counter and prescription medicines only as told by your doctor. This includes iron pills (supplements) and vitamins.  If you cannot handle taking iron pills by mouth, ask your doctor about getting iron through: ? A vein (intravenously). ? A shot (injection) into a muscle.  Take iron pills when your stomach is empty. If you cannot handle this, take them with food.  Do not drink milk or take antacids at the same time as your iron pills.  To prevent trouble pooping (constipation), eat fiber or take medicine (stool softener) as told by your doctor. Eating and drinking  Talk with your doctor before changing the foods you eat. He or she may tell you to eat foods that have a lot of iron, such as: ? Liver. ? Lowfat (lean) beef. ? Breads and cereals that have iron added to them (fortified breads and cereals). ? Eggs. ? Dried fruit. ? Dark green, leafy vegetables.  Drink enough fluid to keep your pee (urine) clear or pale yellow.  Eat fresh fruits and vegetables that are high in vitamin C. They help your body to use iron. Foods with a lot of vitamin C include: ? Oranges. ? Peppers. ? Tomatoes. ? Mangoes. General instructions  Return to your normal activities as told by your doctor. Ask your doctor what activities are safe for you.  Keep yourself clean, and keep things clean around you (your surroundings). Anemia can make you get sick more easily.  Keep all follow-up visits as told by your doctor. This is important. Contact a doctor if:  You feel sick to your stomach (nauseous).  You throw up (vomit).  You feel weak.  You are sweating for no clear reason.  You have trouble pooping, such as: ? Pooping (having a bowel movement) less than 3 times a week. ? Straining to poop. ? Having poop that is hard, dry, or larger than  normal. ? Feeling full or bloated. ? Pain in the lower belly. ? Not feeling better after pooping. Get help right away if:  You pass out (faint). If this happens, do not drive yourself to the hospital. Call your local emergency services (911 in the [place]  You have chest pain.  You have shortness of breath that: ? Is very bad. ? Gets worse with physical activity.  You have a fast heartbeat.  You get light-headed when getting up from sitting or lying down. This information is not intended to replace advice given to you by your health care provider. Make sure you discuss any questions you have with your health care provider. Document Released: 12/23/2010 Document Revised: 08/09/2016 Document Reviewed: 08/09/2016 Elsevier Interactive Patient Education  2017 [employer]   Iron-Rich Diet Iron is a mineral that helps your body to produce hemoglobin. Hemoglobin is a protein in your red  blood cells that carries oxygen to your body's tissues. Eating too little iron may cause you to feel weak and tired, and it can increase your risk for infection. Eating enough iron is necessary for your body's metabolism, muscle function, and nervous system. Iron is naturally found in many foods. It can also be added to foods or fortified in foods. There are two types of dietary iron:  Heme iron. Heme iron is absorbed by the body more easily than nonheme iron. Heme iron is found in meat, poultry, and fish.  Nonheme iron. Nonheme iron is found in dietary supplements, iron-fortified grains, beans, and vegetables.  You may need to follow an iron-rich diet if:  You have been diagnosed with iron deficiency or iron-deficiency anemia.  You have a condition that prevents you from absorbing dietary iron, such as: ? Infection in your intestines. ? Celiac disease. This involves long-lasting (chronic) inflammation of your intestines.  You do not eat enough iron.  You eat a diet that is high in foods that impair  iron absorption.  You have lost a lot of blood.  You have heavy bleeding during your menstrual cycle.  You are pregnant.  What is my plan? Your health care provider may help you to determine how much iron you need per day based on your condition. Generally, when a person consumes sufficient amounts of iron in the diet, the following iron needs are met:  Men. ? 78-58 years old: 11 mg per day. ? 67-22 years old: 8 mg per day.  Women. ? 11-20 years old: 15 mg per day. ? 78-10 years old: 18 mg per day. ? Over 10 years old: 8 mg per day. ? Pregnant women: 27 mg per day. ? Breastfeeding women: 9 mg per day.  What do I need to know about an iron-rich diet?  Eat fresh fruits and vegetables that are high in vitamin C along with foods that are high in iron. This will help increase the amount of iron that your body absorbs from food, especially with foods containing nonheme iron. Foods that are high in vitamin C include oranges, peppers, tomatoes, and mango.  Take iron supplements only as directed by your health care provider. Overdose of iron can be life-threatening. If you were prescribed iron supplements, take them with orange juice or a vitamin C supplement.  Cook foods in pots and pans that are made from iron.  Eat nonheme iron-containing foods alongside foods that are high in heme iron. This helps to improve your iron absorption.  Certain foods and drinks contain compounds that impair iron absorption. Avoid eating these foods in the same meal as iron-rich foods or with iron supplements. These include: ? Coffee, black tea, and red wine. ? Milk, dairy products, and foods that are high in calcium. ? Beans, soybeans, and peas. ? Whole grains.  When eating foods that contain both nonheme iron and compounds that impair iron absorption, follow these tips to absorb iron better. ? Soak beans overnight before cooking. ? Soak whole grains overnight and drain them before using. ? Ferment  flours before baking, such as using yeast in bread dough. What foods can I eat? Grains Iron-fortified breakfast cereal. Iron-fortified whole-wheat bread. Enriched rice. Sprouted grains. Vegetables Spinach. Potatoes with skin. Green peas. Broccoli. Red and green bell peppers. Fermented vegetables. Fruits Prunes. Raisins. Oranges. Strawberries. Mango. Grapefruit. Meats and Other Protein Sources Beef liver. Oysters. Beef. Shrimp. [place]. Chicken. [place]. Sardines. Chickpeas. Nuts. Tofu. Beverages Tomato juice. Fresh orange juice. Prune  juice. Hibiscus tea. Fortified instant breakfast shakes. Condiments Tahini. Fermented soy sauce. Sweets and Desserts Black-strap molasses. Other Wheat germ. The items listed above may not be a complete list of recommended foods or beverages. Contact your dietitian for more options. What foods are not recommended? Grains Whole grains. Bran cereal. Bran flour. Oats. Vegetables Artichokes. Brussels sprouts. Kale. Fruits Blueberries. Raspberries. Strawberries. Figs. Meats and Other Protein Sources Soybeans. Products made from soy protein. Dairy Milk. Cream. Cheese. Yogurt. Cottage cheese. Beverages Coffee. Black tea. Red wine. Sweets and Desserts Cocoa. Chocolate. Ice cream. Other Basil. Oregano. Parsley. The items listed above may not be a complete list of foods and beverages to avoid. Contact your dietitian for more information. This information is not intended to replace advice given to you by your health care provider. Make sure you discuss any questions you have with your health care provider. Document Released: 07/04/2005 Document Revised: 06/09/2016 Document Reviewed: 06/17/2014 Elsevier Interactive Patient Education  [employer].

## 2018-01-03 NOTE — Progress Notes (Signed)
Patient discharged home with instructions given on medications,and follow up visits patient verbalized understanding. Prescriptions sent home with patient.IV discontinued, catheter intact. Accompanied by staff to an awaiting vehicle.

## 2018-01-03 NOTE — H&P (Addendum)
History and Physical    Tammy Bauer HTD:[id number] DOB: [birth year] DOA: 01/02/2018  PCP: Clent Demark, PA-C   Patient coming from: Home  Chief Complaint: Vaginal bleeding  HPI: Tammy Bauer is a 52 y.o. female with medical history significant for CVA-started on Plavix 2 months ago, hypertension, and diabetes who presented to the emergency department on account of significant vaginal bleeding with large blood clots that began early in the morning yesterday.  She denies any abdominal pain, cramping, chest pain, shortness of breath, or any other symptomatology.  No fever or chills noted.  She denies any aggravating or alleviating factors.   ED Course: Gynecologist Dr. Harolyn Rutherford was contacted and recommended 91m of Megestrol immediately with improvement noted thus far.  Vital signs are stable and orders have been given for a repeat dose of Megestrol at 3 AM along with a 2U PRBC transfusion which the patient is now agreeable to. Baseline Hgb of 14.6 noted 3 months ago and is currently 9.2.   Review of Systems: As per HPI otherwise 10 point review of systems negative.   Past Medical History:  Diagnosis Date  . [place]   . Hypertension   . Stroke ([facility]     Past Surgical History:  Procedure Laterality Date  . LOOP RECORDER INSERTION N/A 06/27/2017   Procedure: Loop Recorder Insertion;  Surgeon: CConstance Haw MD;  [facility];  Service: Cardiovascular;  Laterality: N/A;  . TEE WITHOUT CARDIOVERSION N/A 06/27/2017   Procedure: TRANSESOPHAGEAL ECHOCARDIOGRAM (TEE);  Surgeon: HPixie Casino MD;  [facility];  Service: Cardiovascular;  Laterality: N/A;     reports that she has been smoking cigarettes.  She started smoking about 18 years ago. She has been smoking about 0.25 packs per day. she has never used smokeless tobacco. She reports that she does not drink alcohol or use drugs.  Allergies  Allergen Reactions  .  Ace Inhibitors Cough    Family History  Problem Relation Age of Onset  . Stroke Maternal Grandfather     Prior to Admission medications   Medication Sig Start Date End Date Taking? Authorizing Provider  acetaminophen (TYLENOL) 325 MG tablet Take 650 mg by mouth every 6 (six) hours as needed for mild pain or moderate pain.    Yes [provider]  amLODipine (NORVASC) 10 MG tablet Take 1 tablet (10 mg total) by mouth daily. TAKE ONE TABLET BY MOUTH ONCE DAILY 07/18/17  Yes GTawny Asal aspirin EC 81 MG EC tablet Take 1 tablet (81 mg total) by mouth daily. 10/03/17  Yes Regalado, Belkys A, MD  atorvastatin (LIPITOR) 40 MG tablet Take 1 tablet (40 mg total) by mouth at bedtime. Any generic statin from [employer] list is okay 07/18/17  Yes GClent Demark PA-C  carvedilol (COREG) 6.25 MG tablet Take 1 tablet (6.25 mg total) by mouth 2 (two) times daily with a meal. 07/18/17  Yes GClent Demark PA-C  clopidogrel (PLAVIX) 75 MG tablet Take 1 tablet (75 mg total) by mouth daily. 11/05/17  Yes GClent Demark PA-C  hydrALAZINE (APRESOLINE) 10 MG tablet Take 1 tablet (10 mg total) by mouth 3 (three) times daily. 07/18/17 07/18/18 Yes GClent Demark PA-C  insulin glargine (LANTUS) 100 UNIT/ML injection Inject 0.2 mLs (20 Units total) into the skin daily. 08/21/17  Yes JTresa Garter MD  losartan (COZAAR) 50 MG tablet Take 1 tablet (50 mg total) by mouth daily. 08/20/17  Yes Clent Demark, PA-C  metFORMIN (GLUCOPHAGE XR) 500 MG 24 hr tablet Take 2 tablets (1,000 mg total) by mouth daily with breakfast. Patient taking differently: Take 500 mg by mouth 2 (two) times daily.  07/18/17  Yes Clent Demark, PA-C  blood glucose meter kit and supplies KIT Dispense based on patient and insurance preference. Use up to four times daily as directed. (FOR ICD-9 250.00, 250.01). 07/18/17   Clent Demark, PA-C  glucose blood (RELION GLUCOSE TEST STRIPS) test strip Use as  instructed 07/18/17   Clent Demark, PA-C  INSULIN SYRINGE 1CC/29G (B-D INSULIN SYRINGE) 29G X 1/2" 1 ML MISC 1 application by Does not apply route at bedtime. 07/18/17   Clent Demark, PA-C  nicotine (NICODERM CQ) 14 mg/24hr patch Place 1 patch (14 mg total) onto the skin daily. Patient not taking: Reported on 01/02/2018 10/29/17   Clent Demark, PA-C  nicotine (NICODERM CQ) 7 mg/24hr patch Place 1 patch (7 mg total) onto the skin daily. Patient not taking: Reported on 01/02/2018 10/29/17   Clent Demark, PA-C    Physical Exam: Vitals:   01/03/18 0000 01/03/18 0030 01/03/18 0100 01/03/18 0130  BP: 139/83 139/82 120/77 (!) 132/97  Pulse:  (!) 105 (!) 103 (!) 105  Resp:      Temp:      TempSrc:      SpO2: 100% 98% 98% 100%  Weight:      Height:        Constitutional: NAD, calm, comfortable Vitals:   01/03/18 0000 01/03/18 0030 01/03/18 0100 01/03/18 0130  BP: 139/83 139/82 120/77 (!) 132/97  Pulse:  (!) 105 (!) 103 (!) 105  Resp:      Temp:      TempSrc:      SpO2: 100% 98% 98% 100%  Weight:      Height:       Eyes: lids and conjunctivae normal ENMT: Mucous membranes are moist.  Neck: normal, supple Respiratory: clear to auscultation bilaterally. Normal respiratory effort. No accessory muscle use.  Cardiovascular: Regular rate and rhythm, no murmurs. No extremity edema. Abdomen: no tenderness, no distention. Bowel sounds positive.  Musculoskeletal:  No joint deformity upper and lower extremities.   Skin: no rashes, lesions, ulcers.  Psychiatric: Normal judgment and insight. Alert and oriented x 3. Normal mood.   Labs on Admission: I have personally reviewed following labs and imaging studies  CBC: Recent Labs  Lab 01/02/18 2026 01/02/18 2052 01/03/18 0203  WBC 7.1  --   --   HGB 10.3* 10.2* 9.2*  HCT 31.8* 30.0* 28.3*  MCV 96.4  --   --   PLT 349  --   --    Basic Metabolic Panel: Recent Labs  Lab 01/02/18 2052  NA 141  K 3.9  CL 105    GLUCOSE 268*  BUN 15  CREATININE 0.90   GFR: Estimated Creatinine Clearance: 67.3 mL/min (by C-G formula based on SCr of 0.9 mg/dL). Liver Function Tests: No results for input(s): AST, ALT, ALKPHOS, BILITOT, PROT, ALBUMIN in the last 168 hours. No results for input(s): LIPASE, AMYLASE in the last 168 hours. No results for input(s): AMMONIA in the last 168 hours. Coagulation Profile: Recent Labs  Lab 01/02/18 2333  INR 0.96   Cardiac Enzymes: No results for input(s): CKTOTAL, CKMB, CKMBINDEX, TROPONINI in the last 168 hours. BNP (last 3 results) No results for input(s): PROBNP in the last 8760 hours. HbA1C: No results for input(s):  HGBA1C in the last 72 hours. CBG: No results for input(s): GLUCAP in the last 168 hours. Lipid Profile: No results for input(s): CHOL, HDL, LDLCALC, TRIG, CHOLHDL, LDLDIRECT in the last 72 hours. Thyroid Function Tests: No results for input(s): TSH, T4TOTAL, FREET4, T3FREE, THYROIDAB in the last 72 hours. Anemia Panel: No results for input(s): VITAMINB12, FOLATE, FERRITIN, TIBC, IRON, RETICCTPCT in the last 72 hours. Urine analysis:    Component Value Date/Time   COLORURINE RED (A) 01/02/2018 2026   APPEARANCEUR TURBID (A) 01/02/2018 2026   LABSPEC >1.030 (H) 01/02/2018 2026   PHURINE 6.5 01/02/2018 2026   GLUCOSEU NEGATIVE 01/02/2018 2026   HGBUR LARGE (A) 01/02/2018 2026   BILIRUBINUR NEGATIVE 01/02/2018 2026   KETONESUR TRACE (A) 01/02/2018 2026   PROTEINUR >300 (A) 01/02/2018 2026   UROBILINOGEN 1.0 05/22/2015 2045   NITRITE NEGATIVE 01/02/2018 2026   LEUKOCYTESUR NEGATIVE 01/02/2018 2026    Radiological Exams on Admission: No results found.   Assessment/Plan Principal Problem:   Vaginal bleeding Active Problems:   Essential hypertension   Acute CVA (cerebrovascular accident) ([place])   Tobacco abuse   DM2 (diabetes mellitus, type 2) ([place])   Acute blood loss anemia    1. Profuse vaginal bleeding secondary to dual antiplatelet  treatment regimen.  Withhold antiplatelet agents at this time.  Appreciate gynecology recommendations with administration of Megace.  No need for transfer to [facility] at this time.  Unlikely that patient will require any procedural intervention. Gyn consultation will need to be called in am. 2. Acute blood loss anemia secondary to above.  Type and cross with transfusion of 2 unit PRBCs.  Monitor repeat H&H after transfusion. 3. Type 2 diabetes with hyperglycemia.  Initiate sliding scale insulin and continue home oral hypoglycemic agents.  Carb modified diet. 4. Hypertension.  Continue home blood pressure agents and monitor. 5. Prior CVA.  Hold aspirin and Plavix and continue statin. 6. Tobacco abuse.  Nicotine patch.  Smoking cessation counseling.   DVT prophylaxis: SCDs Code Status: Full Family Communication: None Disposition Plan:Home when stable Consults called:Gynecology; will need to be called in am Admission status: Obs; med-surg   Pratik Darleen Crocker DO [facility] Pager [phone]  If 7PM-7AM, please contact night-coverage [email or web address] Password [email or web address]  01/03/2018, 2:35 AM

## 2018-01-03 NOTE — Progress Notes (Signed)
01/03/2018 12:37 PM  Pt says she felt much better after 1st unit of blood and didn't want to wait to have a second unit infused so will discharge home with follow up as already noted.   Tammy Bauer. Rionna Feltes MD

## 2018-01-06 LAB — TYPE AND SCREEN
ABO/RH(D): B POS
ANTIBODY SCREEN: NEGATIVE
Unit division: 0
Unit division: 0

## 2018-01-06 LAB — BPAM RBC
BLOOD PRODUCT EXPIRATION DATE: 201902202359
Blood Product Expiration Date: 201902202359
ISSUE DATE / TIME: 201901310416
UNIT TYPE AND RH: 1700
UNIT TYPE AND RH: 1700

## 2018-01-08 ENCOUNTER — Ambulatory Visit (INDEPENDENT_AMBULATORY_CARE_PROVIDER_SITE_OTHER): Payer: Self-pay | Admitting: Obstetrics & Gynecology

## 2018-01-08 ENCOUNTER — Encounter: Payer: Self-pay | Admitting: Obstetrics & Gynecology

## 2018-01-08 VITALS — BP 136/80 | HR 80 | Ht 62.0 in | Wt 150.5 lb

## 2018-01-08 DIAGNOSIS — N921 Excessive and frequent menstruation with irregular cycle: Secondary | ICD-10-CM

## 2018-01-08 DIAGNOSIS — D62 Acute posthemorrhagic anemia: Secondary | ICD-10-CM

## 2018-01-08 MED ORDER — MEGESTROL ACETATE 40 MG PO TABS: ORAL_TABLET | ORAL | 3 refills | Status: DC

## 2018-01-08 MED FILL — LOSARTAN POTASSIUM 50 MG TA: 50 | 30 days supply | Qty: 30 | Fill #2

## 2018-01-08 MED FILL — hydrALAZINE HCL 10 MG TABS: 10 | 30 days supply | Qty: 90 | Fill #5

## 2018-01-08 MED FILL — MEGESTROL 40 MG TABLET: 40 | 7 days supply | Qty: 21 | Fill #0

## 2018-01-08 MED FILL — ?ATORVASTATIN 40MG TABLET: 40 | 30 days supply | Qty: 30 | Fill #4

## 2018-01-08 MED FILL — ?CLOPIDOGREL 75MG TABLET: 75 | 30 days supply | Qty: 30 | Fill #2

## 2018-01-08 MED FILL — $LANTUS 100 UNITS/ML VIAL: 100 | 28 days supply | Qty: 10 | Fill #2

## 2018-01-08 NOTE — Progress Notes (Signed)
Chief Complaint  Patient presents with  . Vaginal Bleeding    seen in emergency room      52 y.o. No obstetric history on file. Patient's last menstrual period was 01/02/2018. The current method of family planning is none.  Outpatient Encounter Medications as of 01/08/2018  Medication Sig  . acetaminophen (TYLENOL) 325 MG tablet Take 650 mg by mouth every 6 (six) hours as needed for mild pain or moderate pain.   Marland Kitchen amLODipine (NORVASC) 10 MG tablet Take 1 tablet (10 mg total) by mouth daily. TAKE ONE TABLET BY MOUTH ONCE DAILY  . atorvastatin (LIPITOR) 40 MG tablet Take 1 tablet (40 mg total) by mouth at bedtime. Any generic statin from [facility] list is okay  . blood glucose meter kit and supplies KIT Dispense based on patient and insurance preference. Use up to four times daily as directed. (FOR ICD-9 250.00, 250.01).  . carvedilol (COREG) 6.25 MG tablet Take 1 tablet (6.25 mg total) by mouth 2 (two) times daily with a meal.  . clopidogrel (PLAVIX) 75 MG tablet Take 1 tablet (75 mg total) by mouth daily.  Marland Kitchen losartan (COZAAR) 50 MG tablet Take 1 tablet (50 mg total) by mouth daily.  . megestrol (MEGACE) 40 MG tablet Take 1-2 tablets daily per directions  . [DISCONTINUED] megestrol (MEGACE) 40 MG tablet Take 3 tablets (120 mg total) by mouth daily for 7 days.  Marland Kitchen glucose blood (RELION GLUCOSE TEST STRIPS) test strip Use as instructed  . hydrALAZINE (APRESOLINE) 10 MG tablet Take 1 tablet (10 mg total) by mouth 3 (three) times daily.  . insulin glargine (LANTUS) 100 UNIT/ML injection Inject 0.2 mLs (20 Units total) into the skin daily.  . INSULIN SYRINGE 1CC/29G (B-D INSULIN SYRINGE) 29G X 1/2" 1 ML MISC 1 application by Does not apply route at bedtime.  . metFORMIN (GLUCOPHAGE XR) 500 MG 24 hr tablet Take 1 tablet (500 mg total) by mouth 2 (two) times daily.   No facility-administered encounter medications on file as of 01/08/2018.     Subjective Tammy Bauer [id number] admitted to [facility] last  week for menometrorrhagia anemia requiring transfusion, on plavix for hx of ischemic stroke I started her on megestrol and has continued No bleeding since being on the megestrol wll stay on the megestrol until can have a endometrial ablation Past Medical History:  Diagnosis Date  . Diabetes mellitus without complication ([place])   . Hypertension   . Stroke [facility])     Past Surgical History:  Procedure Laterality Date  . LOOP RECORDER INSERTION N/A 06/27/2017   Procedure: Loop Recorder Insertion;  Surgeon: Constance Haw, MD;  Location: [place] [facility];  Service: Cardiovascular;  Laterality: N/A;  . TEE WITHOUT CARDIOVERSION N/A 06/27/2017   Procedure: TRANSESOPHAGEAL ECHOCARDIOGRAM (TEE);  Surgeon: Pixie Casino, MD;  Location: [facility] ENDOSCOPY;  Service: Cardiovascular;  Laterality: N/A;    OB History    No data available      Allergies  Allergen Reactions  . Ace Inhibitors Cough    Social History   Socioeconomic History  . Marital status: Married    Spouse name: None  . Number of children: None  . Years of education: None  . Highest education level: None  Social Needs  . Financial resource strain: None  . Food insecurity - worry: None  . Food insecurity - inability: None  . Transportation needs - medical: None  . Transportation needs - non-medical: None  Occupational History  .  None  Tobacco Use  . Smoking status: Current Every Day Smoker    Packs/day: 0.25    Types: Cigarettes    Start date: 08/29/1999  . Smokeless tobacco: Never Used  . Tobacco comment: smoke 5 cigarettes a day  Substance and Sexual Activity  . Alcohol use: No  . Drug use: No  . Sexual activity: None  Other Topics Concern  . None  Social History Narrative  . None    Family History  Problem Relation Age of Onset  . Stroke Maternal Grandfather     Medications:       Current Outpatient Medications:  .  acetaminophen (TYLENOL) 325 MG tablet, Take 650 mg by mouth every 6 (six) hours  as needed for mild pain or moderate pain. , Disp: , Rfl:  .  amLODipine (NORVASC) 10 MG tablet, Take 1 tablet (10 mg total) by mouth daily. TAKE ONE TABLET BY MOUTH ONCE DAILY, Disp: 30 tablet, Rfl: 5 .  atorvastatin (LIPITOR) 40 MG tablet, Take 1 tablet (40 mg total) by mouth at bedtime. Any generic statin from [facility] list is okay, Disp: 30 tablet, Rfl: 5 .  blood glucose meter kit and supplies KIT, Dispense based on patient and insurance preference. Use up to four times daily as directed. (FOR ICD-9 250.00, 250.01)., Disp: 1 each, Rfl: 0 .  carvedilol (COREG) 6.25 MG tablet, Take 1 tablet (6.25 mg total) by mouth 2 (two) times daily with a meal., Disp: 60 tablet, Rfl: 5 .  clopidogrel (PLAVIX) 75 MG tablet, Take 1 tablet (75 mg total) by mouth daily., Disp: 90 tablet, Rfl: 3 .  losartan (COZAAR) 50 MG tablet, Take 1 tablet (50 mg total) by mouth daily., Disp: 30 tablet, Rfl: 5 .  megestrol (MEGACE) 40 MG tablet, Take 1-2 tablets daily per directions, Disp: 60 tablet, Rfl: 3 .  glucose blood (RELION GLUCOSE TEST STRIPS) test strip, Use as instructed, Disp: 100 each, Rfl: 12 .  hydrALAZINE (APRESOLINE) 10 MG tablet, Take 1 tablet (10 mg total) by mouth 3 (three) times daily., Disp: 90 tablet, Rfl: 5 .  insulin glargine (LANTUS) 100 UNIT/ML injection, Inject 0.2 mLs (20 Units total) into the skin daily., Disp: 10 mL, Rfl: 3 .  INSULIN SYRINGE 1CC/29G (B-D INSULIN SYRINGE) 29G X 1/2" 1 ML MISC, 1 application by Does not apply route at bedtime., Disp: 30 each, Rfl: 5 .  metFORMIN (GLUCOPHAGE XR) 500 MG 24 hr tablet, Take 1 tablet (500 mg total) by mouth 2 (two) times daily., Disp: , Rfl:   Objective Blood pressure 136/80, pulse 80, height '5\' 2"'$  (1.575 m), weight 150 lb 8 oz (68.3 kg), last menstrual period 01/02/2018.  Gen WDWN NAD  Pertinent ROS No burning with urination, frequency or urgency No nausea, vomiting or diarrhea Nor fever chills or other constitutional symptoms   Labs or  studies Labs from hospital were reviewed as well as sonogram    Impression Diagnoses this Encounter::   ICD-10-CM   1. Menometrorrhagia N92.1   2. Acute blood loss anemia D62    due to vaginal bleeding    Established relevant diagnosis(es): Hx of ischemic stroke  Plan/Recommendations: Meds ordered this encounter  Medications  . megestrol (MEGACE) 40 MG tablet    Sig: Take 1-2 tablets daily per directions    Dispense:  60 tablet    Refill:  3    Labs or Scans Ordered: No orders of the defined types were placed in this encounter.   Management:: Continue megestrol  40 mg daily, 80 mg if needed Schedule hysterscopy uterine curettage endometrial ablation 01/30/2018  Follow up Return in about 4 weeks (around 02/07/2018) for [place], with Dr Elonda Husky.        Face to face time:  20 minutes  Greater than 50% of the visit time was spent in counseling and coordination of care with the patient.  The summary and outline of the counseling and care coordination is summarized in the note above.   All questions were answered.

## 2018-01-18 ENCOUNTER — Ambulatory Visit: Payer: Self-pay

## 2018-01-22 ENCOUNTER — Ambulatory Visit (INDEPENDENT_AMBULATORY_CARE_PROVIDER_SITE_OTHER): Payer: Self-pay | Admitting: Physician Assistant

## 2018-01-22 ENCOUNTER — Encounter (INDEPENDENT_AMBULATORY_CARE_PROVIDER_SITE_OTHER): Payer: Self-pay | Admitting: Physician Assistant

## 2018-01-22 VITALS — BP 172/92 | HR 92 | Temp 98.4°F | Resp 18 | Ht 63.0 in | Wt 148.6 lb

## 2018-01-22 DIAGNOSIS — Z79899 Other long term (current) drug therapy: Secondary | ICD-10-CM

## 2018-01-22 DIAGNOSIS — Z09 Encounter for follow-up examination after completed treatment for conditions other than malignant neoplasm: Secondary | ICD-10-CM

## 2018-01-22 DIAGNOSIS — Z794 Long term (current) use of insulin: Secondary | ICD-10-CM

## 2018-01-22 DIAGNOSIS — D649 Anemia, unspecified: Secondary | ICD-10-CM

## 2018-01-22 DIAGNOSIS — E1159 Type 2 diabetes mellitus with other circulatory complications: Secondary | ICD-10-CM

## 2018-01-22 DIAGNOSIS — E119 Type 2 diabetes mellitus without complications: Secondary | ICD-10-CM

## 2018-01-22 DIAGNOSIS — N921 Excessive and frequent menstruation with irregular cycle: Secondary | ICD-10-CM

## 2018-01-22 LAB — POCT GLYCOSYLATED HEMOGLOBIN (HGB A1C): Hemoglobin A1C: 6.9

## 2018-01-22 LAB — GLUCOSE, POCT (MANUAL RESULT ENTRY): POC Glucose: 143 mg/dl — AB (ref 70–99)

## 2018-01-22 MED ORDER — INSULIN GLARGINE 100 UNIT/ML ~~LOC~~ SOLN: 20.0000 [IU] | Freq: Every day | SUBCUTANEOUS | 5 refills | Status: DC

## 2018-01-22 MED ORDER — FERROUS SULFATE 325 (65 FE) MG PO TBEC: 325.0000 mg | DELAYED_RELEASE_TABLET | Freq: Three times a day (TID) | ORAL | 0 refills | Status: DC

## 2018-01-22 MED ORDER — "INSULIN SYRINGE 29G X 1/2"" 1 ML MISC": 1.0000 "application " | Freq: Every day | 5 refills | Status: DC

## 2018-01-22 MED ORDER — DOCUSATE SODIUM 50 MG PO CAPS: 50.0000 mg | ORAL_CAPSULE | Freq: Two times a day (BID) | ORAL | 0 refills | Status: DC

## 2018-01-22 MED ORDER — METFORMIN HCL ER 500 MG PO TB24: 500.0000 mg | ORAL_TABLET | Freq: Two times a day (BID) | ORAL | 3 refills | Status: DC

## 2018-01-22 NOTE — Patient Instructions (Signed)
Anemia Anemia is a condition in which you do not have enough red blood cells or hemoglobin. Hemoglobin is a substance in red blood cells that carries oxygen. When you do not have enough red blood cells or hemoglobin (are anemic), your body cannot get enough oxygen and your organs may not work properly. As a result, you may feel very tired or have other problems. What are the causes? Common causes of anemia include:  Excessive bleeding. Anemia can be caused by excessive bleeding inside or outside the body, including bleeding from the intestine or from periods in women.  Poor nutrition.  Long-lasting (chronic) kidney, thyroid, and liver disease.  Bone marrow disorders.  Cancer and treatments for cancer.  HIV (human immunodeficiency virus) and AIDS (acquired immunodeficiency syndrome).  Treatments for HIV and AIDS.  Spleen problems.  Blood disorders.  Infections, medicines, and autoimmune disorders that destroy red blood cells.  What are the signs or symptoms? Symptoms of this condition include:  Minor weakness.  Dizziness.  Headache.  Feeling heartbeats that are irregular or faster than normal (palpitations).  Shortness of breath, especially with exercise.  Paleness.  Cold sensitivity.  Indigestion.  Nausea.  Difficulty sleeping.  Difficulty concentrating.  Symptoms may occur suddenly or develop slowly. If your anemia is mild, you may not have symptoms. How is this diagnosed? This condition is diagnosed based on:  Blood tests.  Your medical history.  A physical exam.  Bone marrow biopsy.  Your health care provider may also check your stool (feces) for blood and may do additional testing to look for the cause of your bleeding. You may also have other tests, including:  Imaging tests, such as a CT scan or MRI.  Endoscopy.  Colonoscopy.  How is this treated? Treatment for this condition depends on the cause. If you continue to lose a lot of blood,  you may need to be treated at a hospital. Treatment may include:  Taking supplements of iron, vitamin B12, or folic acid.  Taking a hormone medicine (erythropoietin) that can help to stimulate red blood cell growth.  Having a blood transfusion. This may be needed if you lose a lot of blood.  Making changes to your diet.  Having surgery to remove your spleen.  Follow these instructions at home:  Take over-the-counter and prescription medicines only as told by your health care provider.  Take supplements only as told by your health care provider.  Follow any diet instructions that you were given.  Keep all follow-up visits as told by your health care provider. This is important. Contact a health care provider if:  You develop new bleeding anywhere in the body. Get help right away if:  You are very weak.  You are short of breath.  You have pain in your abdomen or chest.  You are dizzy or feel faint.  You have trouble concentrating.  You have bloody or black, tarry stools.  You vomit repeatedly or you vomit up blood. Summary  Anemia is a condition in which you do not have enough red blood cells or enough of a substance in your red blood cells that carries oxygen (hemoglobin).  Symptoms may occur suddenly or develop slowly.  If your anemia is mild, you may not have symptoms.  This condition is diagnosed with blood tests as well as a medical history and physical exam. Other tests may be needed.  Treatment for this condition depends on the cause of the anemia. This information is not intended to replace advice   given to you by your health care provider. Make sure you discuss any questions you have with your health care provider. Document Released: 12/28/2004 Document Revised: 12/22/2016 Document Reviewed: 12/22/2016 Elsevier Interactive Patient Education  [employer].

## 2018-01-22 NOTE — Progress Notes (Signed)
Subjective:  Patient ID: Tammy Bauer, female    DOB: May 23, 1966  Age: 52 y.o. MRN: [id number]  CC: hospital f/u   HPI Letoya Sheltonis a 52 y.o.RHDfemalewith a medical history of HTN, Cryptogenic stroke, DM2, tobacco abuse, and noncompliancepresents on hospital f/u for menorrhagia exacerbated by Plavix use. Had received a blood transfusion in hospital and was evaluated by GYN. Taking Megestrol which is largely controlling vaginal bleeding. Still has mild bleeding. Pt scheduled for dilatation, curettage, and ablation on 01/30/18. Pt reports feeling moderately better after transfusion. Has not picked up prescription for iron pills. Does not endorse any other symptoms or complaints.     Outpatient Medications Prior to Visit  Medication Sig Dispense Refill  . acetaminophen (TYLENOL) 325 MG tablet Take 650 mg by mouth every 6 (six) hours as needed for mild pain or moderate pain.     Marland Kitchen amLODipine (NORVASC) 10 MG tablet Take 1 tablet (10 mg total) by mouth daily. TAKE ONE TABLET BY MOUTH ONCE DAILY (Patient taking differently: Take 10 mg by mouth daily. ) 30 tablet 5  . atorvastatin (LIPITOR) 40 MG tablet Take 1 tablet (40 mg total) by mouth at bedtime. Any generic statin from [facility] list is okay 30 tablet 5  . blood glucose meter kit and supplies KIT Dispense based on patient and insurance preference. Use up to four times daily as directed. (FOR ICD-9 250.00, 250.01). (Patient not taking: Reported on 01/16/2018) 1 each 0  . carvedilol (COREG) 6.25 MG tablet Take 1 tablet (6.25 mg total) by mouth 2 (two) times daily with a meal. 60 tablet 5  . clopidogrel (PLAVIX) 75 MG tablet Take 1 tablet (75 mg total) by mouth daily. 90 tablet 3  . glucose blood (RELION GLUCOSE TEST STRIPS) test strip Use as instructed (Patient not taking: Reported on 01/16/2018) 100 each 12  . hydrALAZINE (APRESOLINE) 10 MG tablet Take 1 tablet (10 mg total) by mouth 3 (three) times daily. 90 tablet 5  . insulin glargine (LANTUS)  100 UNIT/ML injection Inject 0.2 mLs (20 Units total) into the skin daily. 10 mL 3  . INSULIN SYRINGE 1CC/29G (B-D INSULIN SYRINGE) 29G X 1/2" 1 ML MISC 1 application by Does not apply route at bedtime. (Patient not taking: Reported on 01/16/2018) 30 each 5  . losartan (COZAAR) 50 MG tablet Take 1 tablet (50 mg total) by mouth daily. 30 tablet 5  . megestrol (MEGACE) 40 MG tablet Take 1-2 tablets daily per directions (Patient taking differently: Take 40 mg by mouth daily. ) 60 tablet 3  . metFORMIN (GLUCOPHAGE XR) 500 MG 24 hr tablet Take 1 tablet (500 mg total) by mouth 2 (two) times daily.     No facility-administered medications prior to visit.      ROS Review of Systems  Constitutional: Negative for chills, fever and malaise/fatigue.  Eyes: Negative for blurred vision.  Respiratory: Negative for shortness of breath.   Cardiovascular: Negative for chest pain and palpitations.  Gastrointestinal: Negative for abdominal pain and nausea.  Genitourinary: Negative for dysuria and hematuria.  Musculoskeletal: Negative for joint pain and myalgias.  Skin: Negative for rash.  Neurological: Negative for tingling and headaches.  Psychiatric/Behavioral: Negative for depression. The patient is not nervous/anxious.     Objective:     Physical Exam  Constitutional: She is oriented to person, place, and time.  Well developed, well nourished, NAD, polite  HENT:  Head: Normocephalic and atraumatic.  Eyes: No scleral icterus.  Pale conjunctiva  Cardiovascular: Normal rate,  regular rhythm and normal heart sounds.  Pulmonary/Chest: Effort normal and breath sounds normal.  Musculoskeletal: She exhibits no edema.  Neurological: She is alert and oriented to person, place, and time. No cranial nerve deficit. Coordination normal.  Skin: Skin is warm and dry. No rash noted. No erythema. No pallor.  Psychiatric: She has a normal mood and affect. Her behavior is normal. Thought content normal.  Vitals  reviewed.    Assessment & Plan:   1. Hospital discharge follow-up - Basic Metabolic Panel - CBC with Differential  2. Anemia, unspecified type - Basic Metabolic Panel - CBC with Differential - Begin ferrous sulfate 325 (65 FE) MG EC tablet; Take 1 tablet (325 mg total) by mouth 3 (three) times daily with meals.  Dispense: 90 tablet; Refill: 0  3. Menorrhagia with irregular cycle - Basic Metabolic Panel - CBC with Differential  4. Type 2 diabetes mellitus with other circulatory complication, with long-term current use of insulin (HCC) - HgB A1c 6.9% in clinic today - Glucose (CBG) 143 in clinic today - Refill Metformin, Lantus, and syringes  5. High risk medication use - docusate sodium (COLACE) 50 MG capsule; Take 1 capsule (50 mg total) by mouth 2 (two) times daily.  Dispense: 10 capsule; Refill: 0   Meds ordered this encounter  Medications  . ferrous sulfate 325 (65 FE) MG EC tablet    Sig: Take 1 tablet (325 mg total) by mouth 3 (three) times daily with meals.    Dispense:  90 tablet    Refill:  0    Order Specific Question:   Supervising Provider    Answer:   Tresa Garter [id number]  . docusate sodium (COLACE) 50 MG capsule    Sig: Take 1 capsule (50 mg total) by mouth 2 (two) times daily.    Dispense:  10 capsule    Refill:  0    Order Specific Question:   Supervising Provider    Answer:   Tresa Garter [[id number]]    Follow-up: Return in about 4 weeks (around 02/19/2018) for Anemia and general health maintenance.   Clent Demark PA

## 2018-01-23 LAB — CBC WITH DIFFERENTIAL/PLATELET
BASOS ABS: 0 10*3/uL (ref 0.0–0.2)
BASOS: 0 %
EOS (ABSOLUTE): 0 10*3/uL (ref 0.0–0.4)
Eos: 1 %
HEMOGLOBIN: 10.5 g/dL — AB (ref 11.1–15.9)
Hematocrit: 32.8 % — ABNORMAL LOW (ref 34.0–46.6)
IMMATURE GRANS (ABS): 0 10*3/uL (ref 0.0–0.1)
IMMATURE GRANULOCYTES: 0 %
LYMPHS: 28 %
Lymphocytes Absolute: 1.9 10*3/uL (ref 0.7–3.1)
MCH: 28.8 pg (ref 26.6–33.0)
MCHC: 32 g/dL (ref 31.5–35.7)
MCV: 90 fL (ref 79–97)
MONOCYTES: 7 %
Monocytes Absolute: 0.5 10*3/uL (ref 0.1–0.9)
Neutrophils Absolute: 4.4 10*3/uL (ref 1.4–7.0)
Neutrophils: 64 %
Platelets: 745 10*3/uL — ABNORMAL HIGH (ref 150–379)
RBC: 3.65 x10E6/uL — ABNORMAL LOW (ref 3.77–5.28)
RDW: 14.7 % (ref 12.3–15.4)
WBC: 6.9 10*3/uL (ref 3.4–10.8)

## 2018-01-23 LAB — BASIC METABOLIC PANEL
BUN/Creatinine Ratio: 16 (ref 9–23)
BUN: 11 mg/dL (ref 6–24)
CALCIUM: 10.2 mg/dL (ref 8.7–10.2)
CHLORIDE: 108 mmol/L — AB (ref 96–106)
CO2: 18 mmol/L — ABNORMAL LOW (ref 20–29)
Creatinine, Ser: 0.7 mg/dL (ref 0.57–1.00)
GFR, EST AFRICAN AMERICAN: 118 mL/min/{1.73_m2} (ref >59)
GFR, EST NON AFRICAN AMERICAN: 102 mL/min/{1.73_m2} (ref >59)
Glucose: 125 mg/dL — ABNORMAL HIGH (ref 65–99)
Potassium: 4.6 mmol/L (ref 3.5–5.2)
Sodium: 139 mmol/L (ref 134–144)

## 2018-01-23 NOTE — Patient Instructions (Signed)
Tammy Bauer  01/23/2018     @PREFPERIOPPHARMACY @   Your procedure is scheduled on  01/30/2018 .  Report to [facility] at  1045   A.M.  Call this number if you have problems the morning of surgery:  [phone]   Remember:  Do not eat food or drink liquids after midnight.  Take these medicines the morning of surgery with A SIP OF WATER  Amlodipine, coreg, losartan. Follow instructions given to you by Dr Despina Hidden about your plavix. Take 1/2 of your Lantus the night before your surgery (10 units). Nothing for diabetes the morning of your surgery.   Do not wear jewelry, make-up or nail polish.  Do not wear lotions, powders, or perfumes, or deodorant.  Do not shave 48 hours prior to surgery.  Men may shave face and neck.  Do not bring valuables to the hospital.  [facility] is not responsible for any belongings or valuables.  Contacts, dentures or bridgework may not be worn into surgery.  Leave your suitcase in the car.  After surgery it may be brought to your room.  For patients admitted to the hospital, discharge time will be determined by your treatment team.  Patients discharged the day of surgery will not be allowed to drive home.   Name and phone number of your driver:   family Special instructions:  None  Please read over the following fact sheets that you were given. Anesthesia Post-op Instructions and Care and Recovery After Surgery       Dilation and Curettage or Vacuum Curettage Dilation and curettage (D&C) and vacuum curettage are minor procedures. A D&C involves stretching (dilation) the cervix and scraping (curettage) the inside lining of the uterus (endometrium). During a D&C, tissue is gently scraped from the endometrium, starting from the top portion of the uterus down to the lowest part of the uterus (cervix). During a vacuum curettage, the lining and tissue in the uterus are removed with the use of gentle suction. Curettage may be  performed to either diagnose or treat a problem. As a diagnostic procedure, curettage is performed to examine tissues from the uterus. A diagnostic curettage may be done if you have:  Irregular bleeding in the uterus.  Bleeding with the development of clots.  Spotting between menstrual periods.  Prolonged menstrual periods or other abnormal bleeding.  Bleeding after menopause.  No menstrual period (amenorrhea).  A change in size and shape of the uterus.  Abnormal endometrial cells discovered during a Pap test.  As a treatment procedure, curettage may be performed for the following reasons:  Removal of an IUD (intrauterine device).  Removal of retained placenta after giving birth.  Abortion.  Miscarriage.  Removal of endometrial polyps.  Removal of uncommon types of noncancerous lumps (fibroids).  Tell a health care provider about:  Any allergies you have, including allergies to prescribed medicine or latex.  All medicines you are taking, including vitamins, herbs, eye drops, creams, and over-the-counter medicines. This is especially important if you take any blood-thinning medicine. Bring a list of all of your medicines to your appointment.  Any problems you or family members have had with anesthetic medicines.  Any blood disorders you have.  Any surgeries you have had.  Your medical history and any medical conditions you have.  Whether you are pregnant or may be pregnant.  Recent vaginal infections you have had.  Recent menstrual periods, bleeding problems  you have had, and what form of birth control (contraception) you use. What are the risks? Generally, this is a safe procedure. However, problems may occur, including:  Infection.  Heavy vaginal bleeding.  Allergic reactions to medicines.  Damage to the cervix or other structures or organs.  Development of scar tissue (adhesions) inside the uterus, which can cause abnormal amounts of menstrual bleeding.  This may make it harder to get pregnant in the future.  A hole (perforation) or puncture in the uterine wall. This is rare.  What happens before the procedure? Staying hydrated Follow instructions from your health care provider about hydration, which may include:  Up to 2 hours before the procedure - you may continue to drink clear liquids, such as water, clear fruit juice, black coffee, and plain tea.  Eating and drinking restrictions Follow instructions from your health care provider about eating and drinking, which may include:  8 hours before the procedure - stop eating heavy meals or foods such as meat, fried foods, or fatty foods.  6 hours before the procedure - stop eating light meals or foods, such as toast or cereal.  6 hours before the procedure - stop drinking milk or drinks that contain milk.  2 hours before the procedure - stop drinking clear liquids. If your health care provider told you to take your medicine(s) on the day of your procedure, take them with only a sip of water.  Medicines  Ask your health care provider about: ? Changing or stopping your regular medicines. This is especially important if you are taking diabetes medicines or blood thinners. ? Taking medicines such as aspirin and ibuprofen. These medicines can thin your blood. Do not take these medicines before your procedure if your health care provider instructs you not to.  You may be given antibiotic medicine to help prevent infection. General instructions  For 24 hours before your procedure, do not: ? Douche. ? Use tampons. ? Use medicines, creams, or suppositories in the vagina. ? Have sexual intercourse.  You may be given a pregnancy test on the day of the procedure.  Plan to have someone take you home from the hospital or clinic.  You may have a blood or urine sample taken.  If you will be going home right after the procedure, plan to have someone with you for 24 hours. What happens during  the procedure?  To reduce your risk of infection: ? Your health care team will wash or sanitize their hands. ? Your skin will be washed with soap.  An IV tube will be inserted into one of your veins.  You will be given one of the following: ? A medicine that numbs the area in and around the cervix (local anesthetic). ? A medicine to make you fall asleep (general anesthetic).  You will lie down on your back, with your feet in foot rests (stirrups).  The size and position of your uterus will be checked.  A lubricated instrument (speculum or Sims retractor) will be inserted into the back side of your vagina. The speculum will be used to hold apart the walls of your vagina so your health care provider can see your cervix.  A tool (tenaculum) will be attached to the lip of the cervix to stabilize it.  Your cervix will be softened and dilated. This may be done by: ? Taking a medicine. ? Having tapered dilators or thin rods (laminaria) or gradual widening instruments (tapered dilators) inserted into your cervix.  A small,  sharp, curved instrument (curette) will be used to scrape a small amount of tissue or cells from the endometrium or cervical canal. In some cases, gentle suction is applied with the curette. The curette will then be removed. The cells will be taken to a lab for testing. The procedure may vary among health care providers and hospitals. What happens after the procedure?  You may have mild cramping, backache, pain, and light bleeding or spotting. You may pass small blood clots from your vagina.  You may have to wear compression stockings. These stockings help to prevent blood clots and reduce swelling in your legs.  Your blood pressure, heart rate, breathing rate, and blood oxygen level will be monitored until the medicines you were given have worn off. Summary  Dilation and curettage (D&C) involves stretching (dilation) the cervix and scraping (curettage) the inside lining  of the uterus (endometrium).  After the procedure, you may have mild cramping, backache, pain, and light bleeding or spotting. You may pass small blood clots from your vagina.  Plan to have someone take you home from the hospital or clinic. This information is not intended to replace advice given to you by your health care provider. Make sure you discuss any questions you have with your health care provider. Document Released: 11/20/2005 Document Revised: 08/06/2016 Document Reviewed: 08/06/2016 Elsevier Interactive Patient Education  2018 [employer].  Dilation and Curettage or Vacuum Curettage, Care After These instructions give you information about caring for yourself after your procedure. Your doctor may also give you more specific instructions. Call your doctor if you have any problems or questions after your procedure. Follow these instructions at home: Activity  Do not drive or use heavy machinery while taking prescription pain medicine.  For 24 hours after your procedure, avoid driving.  Take short walks often, followed by rest periods. Ask your doctor what activities are safe for you. After one or two days, you may be able to return to your normal activities.  Do not lift anything that is heavier than 10 lb (4.5 kg) until your doctor approves.  For at least 2 weeks, or as long as told by your doctor: ? Do not douche. ? Do not use tampons. ? Do not have sex. General instructions  Take over-the-counter and prescription medicines only as told by your doctor. This is very important if you take blood thinning medicine.  Do not take baths, swim, or use a hot tub until your doctor approves. Take showers instead of baths.  Wear compression stockings as told by your doctor.  It is up to you to get the results of your procedure. Ask your doctor when your results will be ready.  Keep all follow-up visits as told by your doctor. This is important. Contact a doctor if:  You have  very bad cramps that get worse or do not get better with medicine.  You have very bad pain in your belly (abdomen).  You cannot drink fluids without throwing up (vomiting).  You get pain in a different part of the area between your belly and thighs (pelvis).  You have bad-smelling discharge from your vagina.  You have a rash. Get help right away if:  You are bleeding a lot from your vagina. A lot of bleeding means soaking more than one sanitary pad in an hour, for 2 hours in a row.  You have clumps of blood (blood clots) coming from your vagina.  You have a fever or chills.  Your belly feels  very tender or hard.  You have chest pain.  You have trouble breathing.  You cough up blood.  You feel dizzy.  You feel light-headed.  You pass out (faint).  You have pain in your neck or shoulder area. Summary  Take short walks often, followed by rest periods. Ask your doctor what activities are safe for you. After one or two days, you may be able to return to your normal activities.  Do not lift anything that is heavier than 10 lb (4.5 kg) until your doctor approves.  Do not take baths, swim, or use a hot tub until your doctor approves. Take showers instead of baths.  Contact your doctor if you have any symptoms of infection, like bad-smelling discharge from your vagina. This information is not intended to replace advice given to you by your health care provider. Make sure you discuss any questions you have with your health care provider. Document Released: 08/29/2008 Document Revised: 08/07/2016 Document Reviewed: 08/07/2016 Elsevier Interactive Patient Education  2017 [employer] Hysteroscopy Hysteroscopy is a procedure used for looking inside the womb (uterus). It may be done for various reasons, including:  To evaluate abnormal bleeding, fibroid (benign, noncancerous) tumors, polyps, scar tissue (adhesions), and possibly cancer of the uterus.  To look for lumps (tumors)  and other uterine growths.  To look for causes of why a woman cannot get pregnant (infertility), causes of recurrent loss of pregnancy (miscarriages), or a lost intrauterine device (IUD).  To perform a sterilization by blocking the fallopian tubes from inside the uterus.  In this procedure, a thin, flexible tube with a tiny light and camera on the end of it (hysteroscope) is used to look inside the uterus. A hysteroscopy should be done right after a menstrual period to be sure you are not pregnant. LET [facility] CARE PROVIDER KNOW ABOUT:  Any allergies you have.  All medicines you are taking, including vitamins, herbs, eye drops, creams, and over-the-counter medicines.  Previous problems you or members of your family have had with the use of anesthetics.  Any blood disorders you have.  Previous surgeries you have had.  Medical conditions you have. RISKS AND COMPLICATIONS Generally, this is a safe procedure. However, as with any procedure, complications can occur. Possible complications include:  Putting a hole in the uterus.  Excessive bleeding.  Infection.  Damage to the cervix.  Injury to other organs.  Allergic reaction to medicines.  Too much fluid used in the uterus for the procedure.  BEFORE THE PROCEDURE  Ask your health care provider about changing or stopping any regular medicines.  Do not take aspirin or blood thinners for 1 week before the procedure, or as directed by your health care provider. These can cause bleeding.  If you smoke, do not smoke for 2 weeks before the procedure.  In some cases, a medicine is placed in the cervix the day before the procedure. This medicine makes the cervix have a larger opening (dilate). This makes it easier for the instrument to be inserted into the uterus during the procedure.  Do not eat or drink anything for at least 8 hours before the surgery.  Arrange for someone to take you home after the  procedure. PROCEDURE  You may be given a medicine to relax you (sedative). You may also be given one of the following: ? A medicine that numbs the area around the cervix (local anesthetic). ? A medicine that makes you sleep through the procedure (general anesthetic).  The  hysteroscope is inserted through the vagina into the uterus. The camera on the hysteroscope sends a picture to a TV screen. This gives the surgeon a good view inside the uterus.  During the procedure, air or a liquid is put into the uterus, which allows the surgeon to see better.  Sometimes, tissue is gently scraped from inside the uterus. These tissue samples are sent to a lab for testing. What to expect after the procedure  If you had a general anesthetic, you may be groggy for a couple hours after the procedure.  If you had a local anesthetic, you will be able to go home as soon as you are stable and feel ready.  You may have some cramping. This normally lasts for a couple days.  You may have bleeding, which varies from light spotting for a few days to menstrual-like bleeding for 3-7 days. This is normal.  If your test results are not back during the visit, make an appointment with your health care provider to find out the results. This information is not intended to replace advice given to you by your health care provider. Make sure you discuss any questions you have with your health care provider. Document Released: 02/26/2001 Document Revised: 04/27/2016 Document Reviewed: 06/19/2013 Elsevier Interactive Patient Education  2017 [employer] Inc. Hysteroscopy, Care After Refer to this sheet in the next few weeks. These instructions provide you with information on caring for yourself after your procedure. Your health care provider may also give you more specific instructions. Your treatment has been planned according to current medical practices, but problems sometimes occur. Call your health care provider if you have  any problems or questions after your procedure. What can I expect after the procedure? After your procedure, it is typical to have the following:  You may have some cramping. This normally lasts for a couple days.  You may have bleeding. This can vary from light spotting for a few days to menstrual-like bleeding for 3-7 days.  Follow these instructions at home:  Rest for the first 1-2 days after the procedure.  Only take over-the-counter or prescription medicines as directed by your health care provider. Do not take aspirin. It can increase the chances of bleeding.  Take showers instead of baths for 2 weeks or as directed by your health care provider.  Do not drive for 24 hours or as directed.  Do not drink alcohol while taking pain medicine.  Do not use tampons, douche, or have sexual intercourse for 2 weeks or until your health care provider says it is okay.  Take your temperature twice a day for 4-5 days. Write it down each time.  Follow your health care provider's advice about diet, exercise, and lifting.  If you develop constipation, you may: ? Take a mild laxative if your health care provider approves. ? Add bran foods to your diet. ? Drink enough fluids to keep your urine clear or pale yellow.  Try to have someone with you or available to you for the first 24-48 hours, especially if you were given a general anesthetic.  Follow up with your health care provider as directed. Contact a health care provider if:  You feel dizzy or lightheaded.  You feel sick to your stomach (nauseous).  You have abnormal vaginal discharge.  You have a rash.  You have pain that is not controlled with medicine. Get help right away if:  You have bleeding that is heavier than a normal menstrual period.  You have  a fever.  You have increasing cramps or pain, not controlled with medicine.  You have new belly (abdominal) pain.  You pass out.  You have pain in the tops of your  shoulders (shoulder strap areas).  You have shortness of breath. This information is not intended to replace advice given to you by your health care provider. Make sure you discuss any questions you have with your health care provider. Document Released: 09/10/2013 Document Revised: 04/27/2016 Document Reviewed: 06/19/2013 Elsevier Interactive Patient Education  2017 [employer]  Endometrial Ablation Endometrial ablation is a procedure that destroys the thin inner layer of the lining of the uterus (endometrium). This procedure may be done:  To stop heavy periods.  To stop bleeding that is causing anemia.  To control irregular bleeding.  To treat bleeding caused by small tumors (fibroids) in the endometrium.  This procedure is often an alternative to major surgery, such as removal of the uterus and cervix (hysterectomy). As a result of this procedure:  You may not be able to have children. However, if you are premenopausal (you have not gone through menopause): ? You may still have a small chance of getting pregnant. ? You will need to use a reliable method of birth control after the procedure to prevent pregnancy.  You may stop having a menstrual period, or you may have only a small amount of bleeding during your period. Menstruation may return several years after the procedure.  Tell a health care provider about:  Any allergies you have.  All medicines you are taking, including vitamins, herbs, eye drops, creams, and over-the-counter medicines.  Any problems you or family members have had with the use of anesthetic medicines.  Any blood disorders you have.  Any surgeries you have had.  Any medical conditions you have. What are the risks? Generally, this is a safe procedure. However, problems may occur, including:  A hole (perforation) in the uterus or bowel.  Infection of the uterus, bladder, or vagina.  Bleeding.  Damage to other structures or organs.  An air  bubble in the lung (air embolus).  Problems with pregnancy after the procedure.  Failure of the procedure.  Decreased ability to diagnose cancer in the endometrium.  What happens before the procedure?  You will have tests of your endometrium to make sure there are no pre-cancerous cells or cancer cells present.  You may have an ultrasound of the uterus.  You may be given medicines to thin the endometrium.  Ask your health care provider about: ? Changing or stopping your regular medicines. This is especially important if you take diabetes medicines or blood thinners. ? Taking medicines such as aspirin and ibuprofen. These medicines can thin your blood. Do not take these medicines before your procedure if your doctor tells you not to.  Plan to have someone take you home from the hospital or clinic. What happens during the procedure?  You will lie on an exam table with your feet and legs supported as in a pelvic exam.  To lower your risk of infection: ? Your health care team will wash or sanitize their hands and put on germ-free (sterile) gloves. ? Your genital area will be washed with soap.  An IV tube will be inserted into one of your veins.  You will be given a medicine to help you relax (sedative).  A surgical instrument with a light and camera (resectoscope) will be inserted into your vagina and moved into your uterus. This allows your surgeon  to see inside your uterus.  Endometrial tissue will be removed using one of the following methods: ? Radiofrequency. This method uses a radiofrequency-alternating electric current to remove the endometrium. ? Cryotherapy. This method uses extreme cold to freeze the endometrium. ? Heated-free liquid. This method uses a heated saltwater (saline) solution to remove the endometrium. ? Microwave. This method uses high-energy microwaves to heat up the endometrium and remove it. ? Thermal balloon. This method involves inserting a catheter  with a balloon tip into the uterus. The balloon tip is filled with heated fluid to remove the endometrium. The procedure may vary among health care providers and hospitals. What happens after the procedure?  Your blood pressure, heart rate, breathing rate, and blood oxygen level will be monitored until the medicines you were given have worn off.  As tissue healing occurs, you may notice vaginal bleeding for 4-6 weeks after the procedure. You may also experience: ? Cramps. ? Thin, watery vaginal discharge that is light pink or brown in color. ? A need to urinate more frequently than usual. ? Nausea.  Do not drive for 24 hours if you were given a sedative.  Do not have sex or insert anything into your vagina until your health care provider approves. Summary  Endometrial ablation is done to treat the many causes of heavy menstrual bleeding.  The procedure may be done only after medications have been tried to control the bleeding.  Plan to have someone take you home from the hospital or clinic. This information is not intended to replace advice given to you by your health care provider. Make sure you discuss any questions you have with your health care provider. Document Released: 09/29/2004 Document Revised: 12/07/2016 Document Reviewed: 12/07/2016 Elsevier Interactive Patient Education  2017 [employer]  General Anesthesia, Adult General anesthesia is the use of medicines to make a person "go to sleep" (be unconscious) for a medical procedure. General anesthesia is often recommended when a procedure:  Is long.  Requires you to be still or in an unusual position.  Is major and can cause you to lose blood.  Is impossible to do without general anesthesia.  The medicines used for general anesthesia are called general anesthetics. In addition to making you sleep, the medicines:  Prevent pain.  Control your blood pressure.  Relax your muscles.  Tell a health care provider  about:  Any allergies you have.  All medicines you are taking, including vitamins, herbs, eye drops, creams, and over-the-counter medicines.  Any problems you or family members have had with anesthetic medicines.  Types of anesthetics you have had in the past.  Any bleeding disorders you have.  Any surgeries you have had.  Any medical conditions you have.  Any history of heart or lung conditions, such as heart failure, sleep apnea, or chronic obstructive pulmonary disease (COPD).  Whether you are pregnant or may be pregnant.  Whether you use tobacco, alcohol, marijuana, or street drugs.  Any history of Financial pl[employer].  Any history of depression or anxiety. What are the risks? Generally, this is a safe procedure. However, problems may occur, including:  Allergic reaction to anesthetics.  Lung and heart problems.  Inhaling food or liquids from your stomach into your lungs (aspiration).  Injury to nerves.  Waking up during your procedure and being unable to move (rare).  Extreme agitation or a state of mental confusion (delirium) when you wake up from the anesthetic.  Air in the bloodstream, which can lead to stroke.  These problems are more likely to develop if you are having a major surgery or if you have an advanced medical condition. You can prevent some of these complications by answering all of your health care provider's questions thoroughly and by following all pre-procedure instructions. General anesthesia can cause side effects, including:  Nausea or vomiting  A sore throat from the breathing tube.  Feeling cold or shivery.  Feeling tired, washed out, or achy.  Sleepiness or drowsiness.  Confusion or agitation.  What happens before the procedure? Staying hydrated Follow instructions from your health care provider about hydration, which may include:  Up to 2 hours before the procedure - you may continue to drink clear liquids, such as water, clear  fruit juice, black coffee, and plain tea.  Eating and drinking restrictions Follow instructions from your health care provider about eating and drinking, which may include:  8 hours before the procedure - stop eating heavy meals or foods such as meat, fried foods, or fatty foods.  6 hours before the procedure - stop eating light meals or foods, such as toast or cereal.  6 hours before the procedure - stop drinking milk or drinks that contain milk.  2 hours before the procedure - stop drinking clear liquids.  Medicines  Ask your health care provider about: ? Changing or stopping your regular medicines. This is especially important if you are taking diabetes medicines or blood thinners. ? Taking medicines such as aspirin and ibuprofen. These medicines can thin your blood. Do not take these medicines before your procedure if your health care provider instructs you not to. ? Taking new dietary supplements or medicines. Do not take these during the week before your procedure unless your health care provider approves them.  If you are told to take a medicine or to continue taking a medicine on the day of the procedure, take the medicine with sips of water. General instructions   Ask if you will be going home the same day, the following day, or after a longer hospital stay. ? Plan to have someone take you home. ? Plan to have someone stay with you for the first 24 hours after you leave the hospital or clinic.  For 3-6 weeks before the procedure, try not to use any tobacco products, such as cigarettes, chewing tobacco, and e-cigarettes.  You may brush your teeth on the morning of the procedure, but make sure to spit out the toothpaste. What happens during the procedure?  You will be given anesthetics through a mask and through an IV tube in one of your veins.  You may receive medicine to help you relax (sedative).  As soon as you are asleep, a breathing tube may be used to help you  breathe.  An anesthesia specialist will stay with you throughout the procedure. He or she will help keep you comfortable and safe by continuing to give you medicines and adjusting the amount of medicine that you get. He or she will also watch your blood pressure, pulse, and oxygen levels to make sure that the anesthetics do not cause any problems.  If a breathing tube was used to help you breathe, it will be removed before you wake up. The procedure may vary among health care providers and hospitals. What happens after the procedure?  You will wake up, often slowly, after the procedure is complete, usually in a recovery area.  Your blood pressure, heart rate, breathing rate, and blood oxygen level will be monitored until the  medicines you were given have worn off.  You may be given medicine to help you calm down if you feel anxious or agitated.  If you will be going home the same day, your health care provider may check to make sure you can stand, drink, and urinate.  Your health care providers will treat your pain and side effects before you go home.  Do not drive for 24 hours if you received a sedative.  You may: ? Feel nauseous and vomit. ? Have a sore throat. ? Have mental slowness. ? Feel cold or shivery. ? Feel sleepy. ? Feel tired. ? Feel sore or achy, even in parts of your body where you did not have surgery. This information is not intended to replace advice given to you by your health care provider. Make sure you discuss any questions you have with your health care provider. Document Released: 02/27/2008 Document Revised: 05/02/2016 Document Reviewed: 11/04/2015 Elsevier Interactive Patient Education  2018 [employer]. General Anesthesia, Adult, Care After These instructions provide you with information about caring for yourself after your procedure. Your health care provider may also give you more specific instructions. Your treatment has been planned according to current  medical practices, but problems sometimes occur. Call your health care provider if you have any problems or questions after your procedure. What can I expect after the procedure? After the procedure, it is common to have:  Vomiting.  A sore throat.  Mental slowness.  It is common to feel:  Nauseous.  Cold or shivery.  Sleepy.  Tired.  Sore or achy, even in parts of your body where you did not have surgery.  Follow these instructions at home: For at least 24 hours after the procedure:  Do not: ? Participate in activities where you could fall or become injured. ? Drive. ? Use heavy machinery. ? Drink alcohol. ? Take sleeping pills or medicines that cause drowsiness. ? Make important decisions or sign legal documents. ? Take care of children on your own.  Rest. Eating and drinking  If you vomit, drink water, juice, or soup when you can drink without vomiting.  Drink enough fluid to keep your urine clear or pale yellow.  Make sure you have little or no nausea before eating solid foods.  Follow the diet recommended by your health care provider. General instructions  Have a responsible adult stay with you until you are awake and alert.  Return to your normal activities as told by your health care provider. Ask your health care provider what activities are safe for you.  Take over-the-counter and prescription medicines only as told by your health care provider.  If you smoke, do not smoke without supervision.  Keep all follow-up visits as told by your health care provider. This is important. Contact a health care provider if:  You continue to have nausea or vomiting at home, and medicines are not helpful.  You cannot drink fluids or start eating again.  You cannot urinate after 8-12 hours.  You develop a skin rash.  You have fever.  You have increasing redness at the site of your procedure. Get help right away if:  You have difficulty breathing.  You  have chest pain.  You have unexpected bleeding.  You feel that you are having a life-threatening or urgent problem. This information is not intended to replace advice given to you by your health care provider. Make sure you discuss any questions you have with your health care provider. Document Released:  02/26/2001 Document Revised: 04/24/2016 Document Reviewed: 11/04/2015 Elsevier Interactive Patient Education  [employer].

## 2018-01-25 ENCOUNTER — Other Ambulatory Visit: Payer: Self-pay

## 2018-01-25 ENCOUNTER — Encounter (HOSPITAL_COMMUNITY)
Admission: RE | Admit: 2018-01-25 | Discharge: 2018-01-25 | Disposition: A | Discharge status: Home / Self Care | Payer: Self-pay | Source: Ambulatory Visit | Attending: Obstetrics & Gynecology | Admitting: Obstetrics & Gynecology

## 2018-01-25 ENCOUNTER — Other Ambulatory Visit: Payer: Self-pay | Admitting: Obstetrics & Gynecology

## 2018-01-25 ENCOUNTER — Encounter (HOSPITAL_COMMUNITY): Payer: Self-pay

## 2018-01-25 DIAGNOSIS — N921 Excessive and frequent menstruation with irregular cycle: Secondary | ICD-10-CM | POA: Insufficient documentation

## 2018-01-25 DIAGNOSIS — D62 Acute posthemorrhagic anemia: Secondary | ICD-10-CM | POA: Insufficient documentation

## 2018-01-25 DIAGNOSIS — Z01818 Encounter for other preprocedural examination: Secondary | ICD-10-CM | POA: Insufficient documentation

## 2018-01-25 HISTORY — DX: Anemia, unspecified: D64.9

## 2018-01-25 LAB — URINALYSIS, MICROSCOPIC (REFLEX)
Squamous Epithelial / LPF: NONE SEEN
WBC UA: NONE SEEN WBC/hpf (ref 0–5)

## 2018-01-25 LAB — RAPID HIV SCREEN (HIV 1/2 AB+AG)
HIV 1/2 Antibodies: NONREACTIVE
HIV-1 P24 Antigen - HIV24: NONREACTIVE

## 2018-01-25 LAB — COMPREHENSIVE METABOLIC PANEL
ALBUMIN: 4.1 g/dL (ref 3.5–5.0)
ALK PHOS: 46 U/L (ref 38–126)
ALT: 11 U/L — ABNORMAL LOW (ref 14–54)
ANION GAP: 11 (ref 5–15)
AST: 10 U/L — AB (ref 15–41)
BILIRUBIN TOTAL: 0.4 mg/dL (ref 0.3–1.2)
BUN: 13 mg/dL (ref 6–20)
CALCIUM: 9.8 mg/dL (ref 8.9–10.3)
CO2: 18 mmol/L — AB (ref 22–32)
Chloride: 108 mmol/L (ref 101–111)
Creatinine, Ser: 0.75 mg/dL (ref 0.44–1.00)
GFR calc Af Amer: >60 mL/min (ref >60)
GFR calc non Af Amer: >60 mL/min (ref >60)
GLUCOSE: 111 mg/dL — AB (ref 65–99)
POTASSIUM: 3.4 mmol/L — AB (ref 3.5–5.1)
SODIUM: 137 mmol/L (ref 135–145)
TOTAL PROTEIN: 7.7 g/dL (ref 6.5–8.1)

## 2018-01-25 LAB — CBC
HCT: 33.1 % — ABNORMAL LOW (ref 36.0–46.0)
Hemoglobin: 10.2 g/dL — ABNORMAL LOW (ref 12.0–15.0)
MCH: 28.1 pg (ref 26.0–34.0)
MCHC: 30.8 g/dL (ref 30.0–36.0)
MCV: 91.2 fL (ref 78.0–100.0)
Platelets: 612 10*3/uL — ABNORMAL HIGH (ref 150–400)
RBC: 3.63 MIL/uL — ABNORMAL LOW (ref 3.87–5.11)
RDW: 14.6 % (ref 11.5–15.5)
WBC: 8.4 10*3/uL (ref 4.0–10.5)

## 2018-01-25 LAB — URINALYSIS, ROUTINE W REFLEX MICROSCOPIC
Glucose, UA: NEGATIVE mg/dL
Ketones, ur: 15 mg/dL — AB
NITRITE: POSITIVE — AB
Protein, ur: >300 mg/dL — AB
Specific Gravity, Urine: 1.025 (ref 1.005–1.030)
pH: 5 (ref 5.0–8.0)

## 2018-01-25 LAB — HCG, QUANTITATIVE, PREGNANCY: hCG, Beta Chain, Quant, S: <1 m[IU]/mL (ref <5)

## 2018-01-25 LAB — GLUCOSE, CAPILLARY: Glucose-Capillary: 112 mg/dL — ABNORMAL HIGH (ref 65–99)

## 2018-01-26 LAB — HEMOGLOBIN A1C
Hgb A1c MFr Bld: 6.6 % — ABNORMAL HIGH (ref 4.8–5.6)
Mean Plasma Glucose: 143 mg/dL

## 2018-01-28 ENCOUNTER — Ambulatory Visit (INDEPENDENT_AMBULATORY_CARE_PROVIDER_SITE_OTHER): Payer: Self-pay | Admitting: *Deleted

## 2018-01-28 DIAGNOSIS — I639 Cerebral infarction, unspecified: Secondary | ICD-10-CM

## 2018-01-28 NOTE — Pre-Procedure Instructions (Signed)
HgbA1C routed to PCP. 

## 2018-01-28 NOTE — Progress Notes (Signed)
[facility] Summary Report / Loop Recorder 

## 2018-01-29 ENCOUNTER — Telehealth (INDEPENDENT_AMBULATORY_CARE_PROVIDER_SITE_OTHER): Payer: Self-pay | Admitting: *Deleted

## 2018-01-29 NOTE — Telephone Encounter (Signed)
Patient verified DOB Patient is aware of anemia improving and a recheck being completed after she meets with GYN and completes the procedure. No further questions.

## 2018-01-29 NOTE — Telephone Encounter (Signed)
-----   Message from Loletta Specteroger David Gomez, PA-C sent at 01/23/2018  9:37 AM EST ----- Still anemia but improving. Keep appointment with GYN. Will repeat labs after her procedure.

## 2018-01-30 ENCOUNTER — Ambulatory Visit: Payer: Self-pay | Admitting: Nurse Practitioner

## 2018-01-30 ENCOUNTER — Ambulatory Visit (HOSPITAL_COMMUNITY)
Admission: RE | Admit: 2018-01-30 | Discharge: 2018-01-30 | Disposition: A | Discharge status: Home / Self Care | Payer: Self-pay | Source: Ambulatory Visit | Attending: Obstetrics & Gynecology | Admitting: Obstetrics & Gynecology

## 2018-01-30 ENCOUNTER — Ambulatory Visit (HOSPITAL_COMMUNITY): Payer: Self-pay | Admitting: Anesthesiology

## 2018-01-30 ENCOUNTER — Encounter (HOSPITAL_COMMUNITY): Payer: Self-pay | Admitting: *Deleted

## 2018-01-30 ENCOUNTER — Encounter (HOSPITAL_COMMUNITY)
Admission: RE | Disposition: A | Discharge status: Home / Self Care | Payer: Self-pay | Source: Ambulatory Visit | Attending: Obstetrics & Gynecology

## 2018-01-30 DIAGNOSIS — D62 Acute posthemorrhagic anemia: Secondary | ICD-10-CM | POA: Insufficient documentation

## 2018-01-30 DIAGNOSIS — I119 Hypertensive heart disease without heart failure: Secondary | ICD-10-CM | POA: Insufficient documentation

## 2018-01-30 DIAGNOSIS — Z7902 Long term (current) use of antithrombotics/antiplatelets: Secondary | ICD-10-CM | POA: Insufficient documentation

## 2018-01-30 DIAGNOSIS — N921 Excessive and frequent menstruation with irregular cycle: Secondary | ICD-10-CM | POA: Insufficient documentation

## 2018-01-30 DIAGNOSIS — Z8673 Personal history of transient ischemic attack (TIA), and cerebral infarction without residual deficits: Secondary | ICD-10-CM | POA: Insufficient documentation

## 2018-01-30 DIAGNOSIS — D649 Anemia, unspecified: Secondary | ICD-10-CM

## 2018-01-30 DIAGNOSIS — Z7901 Long term (current) use of anticoagulants: Secondary | ICD-10-CM | POA: Insufficient documentation

## 2018-01-30 DIAGNOSIS — E109 Type 1 diabetes mellitus without complications: Secondary | ICD-10-CM | POA: Insufficient documentation

## 2018-01-30 DIAGNOSIS — F17210 Nicotine dependence, cigarettes, uncomplicated: Secondary | ICD-10-CM | POA: Insufficient documentation

## 2018-01-30 HISTORY — PX: ENDOMETRIAL ABLATION: SHX621

## 2018-01-30 HISTORY — PX: HYSTEROSCOPY W/D&C: SHX1775

## 2018-01-30 LAB — GLUCOSE, CAPILLARY
GLUCOSE-CAPILLARY: 123 mg/dL — AB (ref 65–99)
GLUCOSE-CAPILLARY: 94 mg/dL (ref 65–99)

## 2018-01-30 SURGERY — DILATATION AND CURETTAGE /HYSTEROSCOPY
Anesthesia: General | Site: Vagina

## 2018-01-30 MED ORDER — HYDROMORPHONE HCL 1 MG/ML IJ SOLN
0.5000 mg | INTRAMUSCULAR | Status: DC | PRN
  Administered 2018-01-30: 0.5 mg via INTRAVENOUS

## 2018-01-30 MED ORDER — MIDAZOLAM HCL 2 MG/2ML IJ SOLN
INTRAMUSCULAR | Status: AC
  Filled 2018-01-30: qty 2

## 2018-01-30 MED ORDER — MIDAZOLAM HCL 2 MG/2ML IJ SOLN
1.0000 mg | Freq: Once | INTRAMUSCULAR | Status: AC | PRN
  Administered 2018-01-30: 2 mg via INTRAVENOUS

## 2018-01-30 MED ORDER — SODIUM CHLORIDE 0.9 % IR SOLN
Status: DC | PRN
  Administered 2018-01-30: 1000 mL

## 2018-01-30 MED ORDER — KETOROLAC TROMETHAMINE 10 MG PO TABS: 10.0000 mg | ORAL_TABLET | Freq: Three times a day (TID) | ORAL | 0 refills | Status: DC | PRN

## 2018-01-30 MED ORDER — FENTANYL CITRATE (PF) 100 MCG/2ML IJ SOLN
INTRAMUSCULAR | Status: DC | PRN
  Administered 2018-01-30: 50 ug via INTRAVENOUS

## 2018-01-30 MED ORDER — HYDROCODONE-ACETAMINOPHEN 5-325 MG PO TABS: 1.0000 | ORAL_TABLET | Freq: Four times a day (QID) | ORAL | 0 refills | Status: DC | PRN

## 2018-01-30 MED ORDER — HYDROMORPHONE HCL 1 MG/ML IJ SOLN
INTRAMUSCULAR | Status: AC
Start: 2018-01-30 — End: ?
  Filled 2018-01-30: qty 0.5

## 2018-01-30 MED ORDER — LIDOCAINE HCL (CARDIAC) 10 MG/ML IV SOLN
INTRAVENOUS | Status: DC | PRN
  Administered 2018-01-30: 40 mg via INTRAVENOUS

## 2018-01-30 MED ORDER — KETOROLAC TROMETHAMINE 30 MG/ML IJ SOLN
30.0000 mg | Freq: Once | INTRAMUSCULAR | Status: AC
  Administered 2018-01-30: 30 mg via INTRAVENOUS
  Filled 2018-01-30: qty 1

## 2018-01-30 MED ORDER — CEFAZOLIN SODIUM-DEXTROSE 2-4 GM/100ML-% IV SOLN
2.0000 g | INTRAVENOUS | Status: AC
  Administered 2018-01-30: 2 g via INTRAVENOUS
  Filled 2018-01-30: qty 100

## 2018-01-30 MED ORDER — ONDANSETRON 8 MG PO TBDP: 8.0000 mg | ORAL_TABLET | Freq: Three times a day (TID) | ORAL | 0 refills | Status: DC | PRN

## 2018-01-30 MED ORDER — FENTANYL CITRATE (PF) 250 MCG/5ML IJ SOLN
INTRAMUSCULAR | Status: AC
  Filled 2018-01-30: qty 5

## 2018-01-30 MED ORDER — LACTATED RINGERS IV SOLN
INTRAVENOUS | Status: DC
  Administered 2018-01-30: 11:00:00 via INTRAVENOUS

## 2018-01-30 MED ORDER — PROPOFOL 10 MG/ML IV BOLUS
INTRAVENOUS | Status: DC | PRN
  Administered 2018-01-30: 160 mg via INTRAVENOUS

## 2018-01-30 SURGICAL SUPPLY — 27 items
BAG HAMPER (MISCELLANEOUS) ×3 IMPLANT
CLOTH BEACON ORANGE TIMEOUT ST (SAFETY) ×3 IMPLANT
COVER LIGHT HANDLE STERIS (MISCELLANEOUS) ×6 IMPLANT
GAUZE SPONGE 4X4 12PLY STRL (GAUZE/BANDAGES/DRESSINGS) ×3 IMPLANT
GAUZE SPONGE 4X4 16PLY XRAY LF (GAUZE/BANDAGES/DRESSINGS) ×3 IMPLANT
GLOVE BIOGEL PI IND STRL 7.0 (GLOVE) ×1 IMPLANT
GLOVE BIOGEL PI IND STRL 8 (GLOVE) ×1 IMPLANT
GLOVE BIOGEL PI INDICATOR 7.0 (GLOVE) ×2
GLOVE BIOGEL PI INDICATOR 8 (GLOVE) ×2
GLOVE ECLIPSE 8.0 STRL XLNG CF (GLOVE) ×3 IMPLANT
GOWN STRL REUS W/TWL LRG LVL3 (GOWN DISPOSABLE) ×3 IMPLANT
GOWN STRL REUS W/TWL XL LVL3 (GOWN DISPOSABLE) ×3 IMPLANT
HANDPIECE ABLA MINERVA ENDO (MISCELLANEOUS) ×3 IMPLANT
INST SET HYSTEROSCOPY (KITS) ×3 IMPLANT
IV NS 1000ML (IV SOLUTION) ×2
IV NS 1000ML BAXH (IV SOLUTION) ×1 IMPLANT
KIT ROOM TURNOVER AP CYSTO (KITS) ×3 IMPLANT
MANIFOLD NEPTUNE II (INSTRUMENTS) ×3 IMPLANT
MARKER SKIN DUAL TIP RULER LAB (MISCELLANEOUS) ×3 IMPLANT
NS IRRIG 1000ML POUR BTL (IV SOLUTION) ×3 IMPLANT
PACK BASIC III (CUSTOM PROCEDURE TRAY) ×2
PACK SRG BSC III STRL LF ECLPS (CUSTOM PROCEDURE TRAY) ×1 IMPLANT
PAD ARMBOARD 7.5X6 YLW CONV (MISCELLANEOUS) ×3 IMPLANT
PAD TELFA 3X4 1S STER (GAUZE/BANDAGES/DRESSINGS) ×3 IMPLANT
SET IRRIG Y TYPE TUR BLADDER L (SET/KITS/TRAYS/PACK) ×3 IMPLANT
SHEET LAVH (DRAPES) ×3 IMPLANT
YANKAUER SUCT BULB TIP 10FT TU (MISCELLANEOUS) ×3 IMPLANT

## 2018-01-30 NOTE — Transfer of Care (Signed)
Immediate Anesthesia Transfer of Care Note  Patient: Tammy Bauer  Procedure(s) Performed: DILATATION AND CURETTAGE /HYSTEROSCOPY (N/A Vagina ) ENDOMETRIAL ABLATION WITH MINERVA (N/A Vagina )  Patient Location: PACU  Anesthesia Type:General  Level of Consciousness: awake, alert  and oriented  Airway & Oxygen Therapy: Patient Spontanous Breathing  Post-op Assessment: Report given to RN and Post -op Vital signs reviewed and stable  Post vital signs: Reviewed and stable  Last Vitals:  Vitals:   01/30/18 1054 01/30/18 1130  BP:  (!) 169/99  Resp:  20  Temp: 37.1 C   SpO2:  99%    Last Pain:  Vitals:   01/30/18 1054  TempSrc: Oral  PainSc: 0-No pain      Patients Stated Pain Goal: 8 (01/30/18 1054)  Complications: No apparent anesthesia complications

## 2018-01-30 NOTE — H&P (Signed)
Preoperative History and Physical  Tammy Bauer is a 52 y.o. No obstetric history on file. with Patient's last menstrual period was 01/22/2018. admitted for a hysteroscopy uterine curettage Minerva endometrial ablation.   Subjective Tammy Bauer G4P4 admitted to [facility] in January for menometrorrhagia anemia requiring transfusion, on plavix for hx of ischemic stroke I started her on megestrol and has continued No bleeding since being on the megestrol wll stay on the megestrol until can have a endometrial ablation    PMH:    Past Medical History:  Diagnosis Date  . Anemia   . Diabetes mellitus without complication (HCC)   . Hypertension   . Stroke [facility])    x2; no deficits    PSH:     Past Surgical History:  Procedure Laterality Date  . LOOP RECORDER INSERTION N/A 06/27/2017   Procedure: Loop Recorder Insertion;  Surgeon: Regan Lemming, MD;  Location: [facility] INVASIVE CV LAB;  Service: Cardiovascular;  Laterality: N/A;  . TEE WITHOUT CARDIOVERSION N/A 06/27/2017   Procedure: TRANSESOPHAGEAL ECHOCARDIOGRAM (TEE);  Surgeon: [name], MD;  Location: [facility] ENDOSCOPY;  Service: Cardiovascular;  Laterality: N/A;    POb/GynH:      OB History    No data available      SH:   Social History   Tobacco Use  . Smoking status: Current Every Day Smoker    Packs/day: 0.25    Years: 20.00    Pack years: 5.00    Types: Cigarettes    Start date: 08/29/1999  . Smokeless tobacco: Never Used  . Tobacco comment: smoke 5 cigarettes a day  Substance Use Topics  . Alcohol use: No  . Drug use: No    FH:    Family History  Problem Relation Age of Onset  . Stroke Maternal Grandfather      Allergies:  Allergies  Allergen Reactions  . Ace Inhibitors Cough    Medications:       Current Facility-Administered Medications:  .  ceFAZolin (ANCEF) IVPB 2g/100 mL premix, 2 g, Intravenous, On Call to OR, Lazaro Arms, MD .  lactated ringers infusion, , Intravenous, Continuous,  Carlyle Basques, MD, Last Rate: 10 mL/hr at 01/30/18 1127  Review of Systems:   Review of Systems  Constitutional: Negative for fever, chills, weight loss, malaise/fatigue and diaphoresis.  HENT: Negative for hearing loss, ear pain, nosebleeds, congestion, sore throat, neck pain, tinnitus and ear discharge.   Eyes: Negative for blurred vision, double vision, photophobia, pain, discharge and redness.  Respiratory: Negative for cough, hemoptysis, sputum production, shortness of breath, wheezing and stridor.   Cardiovascular: Negative for chest pain, palpitations, orthopnea, claudication, leg swelling and PND.  Gastrointestinal: Positive for abdominal pain. Negative for heartburn, nausea, vomiting, diarrhea, constipation, blood in stool and melena.  Genitourinary: Negative for dysuria, urgency, frequency, hematuria and flank pain.  Musculoskeletal: Negative for myalgias, back pain, joint pain and falls.  Skin: Negative for itching and rash.  Neurological: Negative for dizziness, tingling, tremors, sensory change, speech change, focal weakness, seizures, loss of consciousness, weakness and headaches.  Endo/Heme/Allergies: Negative for environmental allergies and polydipsia. Does not bruise/bleed easily.  Psychiatric/Behavioral: Negative for depression, suicidal ideas, hallucinations, memory loss and substance abuse. The patient is not nervous/anxious and does not have insomnia.      PHYSICAL EXAM:  Blood pressure (!) 169/99, temperature 98.7 F (37.1 C), temperature source Oral, resp. rate 20, last menstrual period 01/22/2018, SpO2 99 %.    Vitals reviewed. Constitutional: She is  oriented to person, place, and time. She appears well-developed and well-nourished.  HENT:  Head: Normocephalic and atraumatic.  Right Ear: External ear normal.  Left Ear: External ear normal.  Nose: Nose normal.  Mouth/Throat: Oropharynx is clear and moist.  Eyes: Conjunctivae and EOM are normal. Pupils are  equal, round, and reactive to light. Right eye exhibits no discharge. Left eye exhibits no discharge. No scleral icterus.  Neck: Normal range of motion. Neck supple. No tracheal deviation present. No thyromegaly present.  Cardiovascular: Normal rate, regular rhythm, normal heart sounds and intact distal pulses.  Exam reveals no gallop and no friction rub.   No murmur heard. Respiratory: Effort normal and breath sounds normal. No respiratory distress. She has no wheezes. She has no rales. She exhibits no tenderness.  GI: Soft. Bowel sounds are normal. She exhibits no distension and no mass. There is tenderness. There is no rebound and no guarding.  Genitourinary:       Vulva is normal without lesions Vagina is pink moist without discharge Cervix normal in appearance and pap is normal Uterus is normal size, contour, position, consistency, mobility, non-tender Adnexa is negative with normal sized ovaries by sonogram  Musculoskeletal: Normal range of motion. She exhibits no edema and no tenderness.  Neurological: She is alert and oriented to person, place, and time. She has normal reflexes. She displays normal reflexes. No cranial nerve deficit. She exhibits normal muscle tone. Coordination normal.  Skin: Skin is warm and dry. No rash noted. No erythema. No pallor.  Psychiatric: She has a normal mood and affect. Her behavior is normal. Judgment and thought content normal.    Labs: Results for orders placed or performed during the hospital encounter of 01/30/18 (from the past 336 hour(s))  Glucose, capillary   Collection Time: 01/30/18 11:19 AM  Result Value Ref Range   Glucose-Capillary 123 (H) 65 - 99 mg/dL  Results for orders placed or performed during the hospital encounter of 01/25/18 (from the past 336 hour(s))  CBC   Collection Time: 01/25/18 12:55 PM  Result Value Ref Range   WBC 8.4 4.0 - 10.5 K/uL   RBC 3.63 (L) 3.87 - 5.11 MIL/uL   Hemoglobin 10.2 (L) 12.0 - 15.0 g/dL   HCT 16.1  (L) 09.6 - 46.0 %   MCV 91.2 78.0 - 100.0 fL   MCH 28.1 26.0 - 34.0 pg   MCHC 30.8 30.0 - 36.0 g/dL   RDW 04.5 40.9 - 81.1 %   Platelets 612 (H) 150 - 400 K/uL  Comprehensive metabolic panel   Collection Time: 01/25/18 12:55 PM  Result Value Ref Range   Sodium 137 135 - 145 mmol/L   Potassium 3.4 (L) 3.5 - 5.1 mmol/L   Chloride 108 101 - 111 mmol/L   CO2 18 (L) 22 - 32 mmol/L   Glucose, Bld 111 (H) 65 - 99 mg/dL   BUN 13 6 - 20 mg/dL   Creatinine, Ser 9.14 0.44 - 1.00 mg/dL   Calcium 9.8 8.9 - 78.2 mg/dL   Total Protein 7.7 6.5 - 8.1 g/dL   Albumin 4.1 3.5 - 5.0 g/dL   AST 10 (L) 15 - 41 U/L   ALT 11 (L) 14 - 54 U/L   Alkaline Phosphatase 46 38 - 126 U/L   Total Bilirubin 0.4 0.3 - 1.2 mg/dL   GFR calc non Af Amer >60 >60 mL/min   GFR calc Af Amer >60 >60 mL/min   Anion gap 11 5 - 15  hCG,  quantitative, pregnancy   Collection Time: 01/25/18 12:55 PM  Result Value Ref Range   hCG, Beta Chain, Quant, S <1 <5 mIU/mL  Rapid HIV screen (HIV 1/2 Ab+Ag)   Collection Time: 01/25/18 12:55 PM  Result Value Ref Range   HIV-1 P24 Antigen - HIV24 NON REACTIVE NON REACTIVE   HIV 1/2 Antibodies NON REACTIVE NON REACTIVE   Interpretation (HIV Ag Ab)      A non reactive test result means that HIV 1 or HIV 2 antibodies and HIV 1 p24 antigen were not detected in the specimen.  Urinalysis, Routine w reflex microscopic   Collection Time: 01/25/18 12:55 PM  Result Value Ref Range   Color, Urine RED (A) YELLOW   APPearance CLOUDY (A) CLEAR   Specific Gravity, Urine 1.025 1.005 - 1.030   pH 5.0 5.0 - 8.0   Glucose, UA NEGATIVE NEGATIVE mg/dL   Hgb urine dipstick LARGE (A) NEGATIVE   Bilirubin Urine SMALL (A) NEGATIVE   Ketones, ur 15 (A) NEGATIVE mg/dL   Protein, ur >829>300 (A) NEGATIVE mg/dL   Nitrite POSITIVE (A) NEGATIVE   Leukocytes, UA SMALL (A) NEGATIVE  Hemoglobin A1c   Collection Time: 01/25/18 12:55 PM  Result Value Ref Range   Hgb A1c MFr Bld 6.6 (H) 4.8 - 5.6 %   Mean Plasma  Glucose 143 mg/dL  Urinalysis, Microscopic (reflex)   Collection Time: 01/25/18 12:55 PM  Result Value Ref Range   RBC / HPF 6-30 0 - 5 RBC/hpf   WBC, UA NONE SEEN 0 - 5 WBC/hpf   Bacteria, UA FEW (A) NONE SEEN   Squamous Epithelial / LPF NONE SEEN NONE SEEN  Glucose, capillary   Collection Time: 01/25/18 12:58 PM  Result Value Ref Range   Glucose-Capillary 112 (H) 65 - 99 mg/dL  Results for orders placed or performed in visit on 01/22/18 (from the past 336 hour(s))  HgB A1c   Collection Time: 01/22/18  9:38 AM  Result Value Ref Range   Hemoglobin A1C 6.9   Glucose (CBG)   Collection Time: 01/22/18  9:39 AM  Result Value Ref Range   POC Glucose 143 (A) 70 - 99 mg/dl  Basic Metabolic Panel   Collection Time: 01/22/18 10:01 AM  Result Value Ref Range   Glucose 125 (H) 65 - 99 mg/dL   BUN 11 6 - 24 mg/dL   Creatinine, Ser 5.620.70 0.57 - 1.00 mg/dL   GFR calc non Af Amer 102 >59 mL/min/1.73   GFR calc Af Amer 118 >59 mL/min/1.73   BUN/Creatinine Ratio 16 9 - 23   Sodium 139 134 - 144 mmol/L   Potassium 4.6 3.5 - 5.2 mmol/L   Chloride 108 (H) 96 - 106 mmol/L   CO2 18 (L) 20 - 29 mmol/L   Calcium 10.2 8.7 - 10.2 mg/dL  CBC with Differential   Collection Time: 01/22/18 10:01 AM  Result Value Ref Range   WBC 6.9 3.4 - 10.8 x10E3/uL   RBC 3.65 (L) 3.77 - 5.28 x10E6/uL   Hemoglobin 10.5 (L) 11.1 - 15.9 g/dL   Hematocrit 13.032.8 (L) 86.534.0 - 46.6 %   MCV 90 79 - 97 fL   MCH 28.8 26.6 - 33.0 pg   MCHC 32.0 31.5 - 35.7 g/dL   RDW 78.414.7 69.612.3 - 29.515.4 %   Platelets 745 (H) 150 - 379 x10E3/uL   Neutrophils 64 Not Estab. %   Lymphs 28 Not Estab. %   Monocytes 7 Not Estab. %   Eos  1 Not Estab. %   Basos 0 Not Estab. %   Neutrophils Absolute 4.4 1.4 - 7.0 x10E3/uL   Lymphocytes Absolute 1.9 0.7 - 3.1 x10E3/uL   Monocytes Absolute 0.5 0.1 - 0.9 x10E3/uL   EOS (ABSOLUTE) 0.0 0.0 - 0.4 x10E3/uL   Basophils Absolute 0.0 0.0 - 0.2 x10E3/uL   Immature Granulocytes 0 Not Estab. %   Immature  Grans (Abs) 0.0 0.0 - 0.1 x10E3/uL    EKG: Orders placed or performed during the hospital encounter of 10/01/17  . ED EKG  . ED EKG  . EKG    Imaging Studies: US Pelvic Complete With Transvaginal  Result Date: 01/03/2018 CLINICAL DATA:  Heavy vaginal bleeding.  Anemia.  LMP 01/02/2018. EXAM: TRANSABDOMINAL AND TRANSVAGINAL ULTRASOUND OF PELVIS TECHNIQUE: Both transabdominal and transvaginal ultrasound examinations of the pelvis were performed. Transabdominal technique was performed for global imaging of the pelvis including uterus, ovaries, adnexal regions, and pelvic cul-de-sac. It was necessary to proceed with endovaginal exam following the transabdominal exam to visualize the endometrium and ovaries. COMPARISON:  None FINDINGS: Uterus Measurements: 10.8 x 6.6 x 6.3 cm. A subserosal fibroid is seen in the right uterine corpus measuring 3.7 cm. A partially calcified fibroid is seen in the left uterine corpus which measures 1.3 cm. Endometrium Thickness: 18 mm.  Heterogeneous appearance of endometrium is noted. Right ovary Measurements: 3.5 x 2.5 x 2.4 cm. Normal appearance/no adnexal mass. Left ovary Measurements: 2.5 x 2.5 x 2.4 cm. Normal appearance/no adnexal mass. Other findings No abnormal free fluid. IMPRESSION: Small uterine fibroids, largest measuring 3.7 cm. Thickened and heterogeneous endometrium measuring 18 mm. If bleeding remains unresponsive to hormonal or medical therapy, focal lesion work-up with sonohysterogram should be considered. Endometrial biopsy should also be considered in pre-menopausal patients at high risk for endometrial carcinoma. (Ref: Radiological Reasoning: Algorithmic Workup of Abnormal Vaginal Bleeding with Endovaginal Sonography and Sonohysterography. AJR 2008; [id number]:A54-09). Normal appearance of both ovaries. Electronically Signed   By: Myles Rosenthal M.D.   On: 01/03/2018 12:17      Assessment: Menometrorrhagia requiring transfusion Chronically  anticoagulated Patient Active Problem List   Diagnosis Date Noted  . Vaginal bleeding 01/03/2018  . Acute blood loss anemia 01/03/2018  . Anemia 01/03/2018  . Acute ischemic stroke (HCC) 10/02/2017  . DM2 (diabetes mellitus, type 2) (HCC) 10/02/2017  . Acute CVA (cerebrovascular accident) (HCC) 06/25/2017  . Hypertensive emergency 06/25/2017  . Tobacco abuse 06/25/2017  . Essential hypertension 10/07/2015  . Hyperglycemia 10/07/2015    Plan: Hysteroscopy uterine curettage endometrial ablation  Lazaro Arms 01/30/2018 12:08 PM

## 2018-01-30 NOTE — Anesthesia Postprocedure Evaluation (Signed)
Anesthesia Post Note  Patient: Rubin PayorMela Uzzell Stults  Procedure(s) Performed: DILATATION AND CURETTAGE /HYSTEROSCOPY (N/A Vagina ) ENDOMETRIAL ABLATION WITH MINERVA (N/A Vagina )  Patient location during evaluation: Short Stay Anesthesia Type: General Level of consciousness: awake and alert and oriented Pain management: pain level controlled Vital Signs Assessment: post-procedure vital signs reviewed and stable Respiratory status: spontaneous breathing Cardiovascular status: blood pressure returned to baseline and unstable Postop Assessment: no apparent nausea or vomiting and adequate PO intake Anesthetic complications: no     Last Vitals:  Vitals:   01/30/18 1400 01/30/18 1409  BP: (!) 155/83 (!) 146/85  Pulse: 92 94  Resp: 14   Temp:  36.8 C  SpO2: 98% 100%    Last Pain:  Vitals:   01/30/18 1409  TempSrc: Oral  PainSc: 2                  Moana Munford

## 2018-01-30 NOTE — Op Note (Signed)
Preoperative diagnosis:  1.   menometrorrhagia                                         2.  Anemia s/p transfusion                                         3.  Chronic anticoagulation   Postoperative diagnoses: Same as above   Procedure: Hysteroscopy, uterine curettage, endometrial ablation using Minerva  Surgeon: Lazaro ArmsLuther H    Anesthesia: Laryngeal mask airway  Findings: The endometrium was normal. There were no fibroid or other abnormalities.  Description of operation: The patient was taken to the operating room and placed in the supine position. She underwent general anesthesia using the laryngeal mask airway. She was placed in the dorsal lithotomy position and prepped and draped in the usual sterile fashion. A Graves speculum was placed and the anterior cervical lip was grasped with a single-tooth tenaculum. The cervix was dilated serially to allow passage of the hysteroscope. Diagnostic hysteroscopy was performed and was found to be normal. A vigorous uterine curettage was then performed and all tissue sent to pathology for evaluation.  I then proceeded to perform the Minerva endometrial ablation.   The uterus sounded to 9 cm The handpiece was attached to the Minerva power source/machine and the handpiece passed the checklist. The array was squeezed down to remove all of the air present.  The array was then place into the endometrial cavity and deployed to a length of 6.0 cm. The handpiece confirmed appropriate width by being in the green portion of the visual dial. The cervical cuff was then inflated to the point the CO2 indicator was in the green. The endometrial integrity check was then performed and integrity sequence was confirmed x 2. The heating was then begun and carried out for a total of 2 minutes(which is standard therapy time). When the plasma cycle was finished,  the cervical cuff was deflated and the array was removed with tissue present on the silicon membrane. There was  appropriate post Minerva bleeding and uterine discharge.     All of the equipment worked well throughout the procedure.  The patient was awakened from anesthesia and taken to the recovery room in good stable condition all counts were correct. She received 2 g of Ancef and 30 mg of Toradol preoperatively. She will be discharged from the recovery room and followed up in the office in 1- 2 weeks.   She can expect 4 weeks of post procedure bloody watery discharge   Lazaro ArmsLuther H , MD  01/30/2018 1:17 PM

## 2018-01-30 NOTE — Anesthesia Preprocedure Evaluation (Addendum)
Anesthesia Evaluation  Patient identified by MRN, date of birth, ID band Patient awake    Airway Mallampati: I       Dental  (+) Teeth Intact   Pulmonary Current Smoker,    Pulmonary exam normal        Cardiovascular hypertension, Pt. on medications and Pt. on home beta blockers  Rhythm:Regular Rate:Normal  Study Conclusions  (Oct 2018)  - Left ventricle: The cavity size was normal. There was mild   concentric hypertrophy. Systolic function was vigorous. The   estimated ejection fraction was in the range of 65% to 70%. Wall   motion was normal; there    ECG:  NSR, non specific st, borderline prolonged qt (2019)   Neuro/Psych CVA (2 last year, on plavix, stable now), No Residual Symptoms    GI/Hepatic negative GI ROS, Neg liver ROS,   Endo/Other  diabetes, Well Controlled, Type 1  Renal/GU negative Renal ROSResults for Tammy Bauer, Tammy Bauer (MRN [id number]) as of 01/30/2018 11:17  01/25/2018 12:55 Sodium: 137 Potassium: 3.4 (L) Chloride: 108 CO2: 18 (L) Glucose: 111 (H) Mean Plasma Glucose: 143 BUN: 13 Creatinine: 0.75      Musculoskeletal   Abdominal Normal abdominal exam  (+)   Peds  Hematology  (+) anemia , Results for Tammy Bauer, Tammy Bauer (MRN [id number]) as of 01/30/2018 11:17  01/25/2018 12:55 WBC: 8.4 RBC: 3.63 (L) Hemoglobin: 10.2 (L) HCT: 33.1 (L) MCV: 91.2 MCH: 28.1 MCHC: 30.8 RDW: 14.6 Platelets: 612 (H)    Anesthesia Other Findings   Reproductive/Obstetrics                            Anesthesia Physical Anesthesia Plan  ASA: III  Anesthesia Plan: General   Post-op Pain Management:    Induction:   PONV Risk Score and Plan: Midazolam  Airway Management Planned: LMA  Additional Equipment:   Intra-op Plan:   Post-operative Plan:   Informed Consent: I have reviewed the patients History and Physical, chart, labs and discussed the procedure including the risks,  benefits and alternatives for the proposed anesthesia with the patient or authorized representative who has indicated his/her understanding and acceptance.   Dental advisory given  Plan Discussed with: CRNA  Anesthesia Plan Comments:        Anesthesia Quick Evaluation

## 2018-01-30 NOTE — Discharge Instructions (Signed)
Endometrial Ablation Endometrial ablation is a procedure that destroys the thin inner layer of the lining of the uterus (endometrium). This procedure may be done:  To stop heavy periods.  To stop bleeding that is causing anemia.  To control irregular bleeding.  To treat bleeding caused by small tumors (fibroids) in the endometrium.  This procedure is often an alternative to major surgery, such as removal of the uterus and cervix (hysterectomy). As a result of this procedure:  You may not be able to have children. However, if you are premenopausal (you have not gone through menopause): ? You may still have a small chance of getting pregnant. ? You will need to use a reliable method of birth control after the procedure to prevent pregnancy.  You may stop having a menstrual period, or you may have only a small amount of bleeding during your period. Menstruation may return several years after the procedure.  Tell a health care provider about:  Any allergies you have.  All medicines you are taking, including vitamins, herbs, eye drops, creams, and over-the-counter medicines.  Any problems you or family members have had with the use of anesthetic medicines.  Any blood disorders you have.  Any surgeries you have had.  Any medical conditions you have. What are the risks? Generally, this is a safe procedure. However, problems may occur, including:  A hole (perforation) in the uterus or bowel.  Infection of the uterus, bladder, or vagina.  Bleeding.  Damage to other structures or organs.  An air bubble in the lung (air embolus).  Problems with pregnancy after the procedure.  Failure of the procedure.  Decreased ability to diagnose cancer in the endometrium.  What happens before the procedure?  You will have tests of your endometrium to make sure there are no pre-cancerous cells or cancer cells present.  You may have an ultrasound of the uterus.  You may be given  medicines to thin the endometrium.  Ask your health care provider about: ? Changing or stopping your regular medicines. This is especially important if you take diabetes medicines or blood thinners. ? Taking medicines such as aspirin and ibuprofen. These medicines can thin your blood. Do not take these medicines before your procedure if your doctor tells you not to.  Plan to have someone take you home from the hospital or clinic. What happens during the procedure?  You will lie on an exam table with your feet and legs supported as in a pelvic exam.  To lower your risk of infection: ? Your health care team will wash or sanitize their hands and put on germ-free (sterile) gloves. ? Your genital area will be washed with soap.  An IV tube will be inserted into one of your veins.  You will be given a medicine to help you relax (sedative).  A surgical instrument with a light and camera (resectoscope) will be inserted into your vagina and moved into your uterus. This allows your surgeon to see inside your uterus.  Endometrial tissue will be removed using one of the following methods: ? Radiofrequency. This method uses a radiofrequency-alternating electric current to remove the endometrium. ? Cryotherapy. This method uses extreme cold to freeze the endometrium. ? Heated-free liquid. This method uses a heated saltwater (saline) solution to remove the endometrium. ? Microwave. This method uses high-energy microwaves to heat up the endometrium and remove it. ? Thermal balloon. This method involves inserting a catheter with a balloon tip into the uterus. The balloon tip is   filled with heated fluid to remove the endometrium. The procedure may vary among health care providers and hospitals. What happens after the procedure?  Your blood pressure, heart rate, breathing rate, and blood oxygen level will be monitored until the medicines you were given have worn off.  As tissue healing occurs, you may  notice vaginal bleeding for 4-6 weeks after the procedure. You may also experience: ? Cramps. ? Thin, watery vaginal discharge that is light pink or brown in color. ? A need to urinate more frequently than usual. ? Nausea.  Do not drive for 24 hours if you were given a sedative.  Do not have sex or insert anything into your vagina until your health care provider approves. Summary  Endometrial ablation is done to treat the many causes of heavy menstrual bleeding.  The procedure may be done only after medications have been tried to control the bleeding.  Plan to have someone take you home from the hospital or clinic. This information is not intended to replace advice given to you by your health care provider. Make sure you discuss any questions you have with your health care provider. Document Released: 09/29/2004 Document Revised: 12/07/2016 Document Reviewed: 12/07/2016 Elsevier Interactive Patient Education  2017 [employer]  

## 2018-01-30 NOTE — Anesthesia Procedure Notes (Signed)
Procedure Name: LMA Insertion Date/Time: 01/30/2018 12:41 PM Performed by: Moshe Salisburyaniel, Emmilyn Crooke E, CRNA Pre-anesthesia Checklist: Patient identified, Patient being monitored, Emergency Drugs available, Timeout performed and Suction available Patient Re-evaluated:Patient Re-evaluated prior to induction Oxygen Delivery Method: Circle System Utilized Preoxygenation: Pre-oxygenation with 100% oxygen Induction Type: IV induction Ventilation: Mask ventilation without difficulty LMA: LMA inserted LMA Size: 3.0 Number of attempts: 1 Placement Confirmation: positive ETCO2 and breath sounds checked- equal and bilateral

## 2018-01-30 NOTE — Progress Notes (Signed)
Awake. Coke given to drink. Tolerated well. C/O postop abd cramping. Refuses pain med at this time.

## 2018-01-31 ENCOUNTER — Encounter (HOSPITAL_COMMUNITY): Payer: Self-pay | Admitting: Obstetrics & Gynecology

## 2018-02-05 ENCOUNTER — Telehealth (INDEPENDENT_AMBULATORY_CARE_PROVIDER_SITE_OTHER): Payer: Self-pay

## 2018-02-05 NOTE — Telephone Encounter (Signed)
-----   Message from Loletta Specteroger David Gomez, PA-C sent at 02/03/2018  2:47 PM EST ----- No carcinoma of the uterus.

## 2018-02-05 NOTE — Telephone Encounter (Signed)
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 Instructed to call office with any questions. Maryjean Mornempestt S Roberts, CMA

## 2018-02-07 ENCOUNTER — Ambulatory Visit (INDEPENDENT_AMBULATORY_CARE_PROVIDER_SITE_OTHER): Payer: Self-pay | Admitting: Obstetrics & Gynecology

## 2018-02-07 ENCOUNTER — Encounter: Payer: Self-pay | Admitting: Obstetrics & Gynecology

## 2018-02-07 VITALS — BP 162/94 | HR 96 | Ht 62.0 in | Wt 146.0 lb

## 2018-02-07 DIAGNOSIS — Z9889 Other specified postprocedural states: Secondary | ICD-10-CM

## 2018-02-07 NOTE — Progress Notes (Signed)
  HPI: Patient returns for routine postoperative follow-up having undergone HYst D&C endo ablation on 01/30/2018.  The patient's immediate postoperative recovery has been unremarkable. Since hospital discharge the patient reports no problems.   Current Outpatient Medications: acetaminophen (TYLENOL) 325 MG tablet, Take 650 mg by mouth every 6 (six) hours as needed for mild pain or moderate pain. , Disp: , Rfl:  amLODipine (NORVASC) 10 MG tablet, Take 1 tablet (10 mg total) by mouth daily. TAKE ONE TABLET BY MOUTH ONCE DAILY (Patient taking differently: Take 10 mg by mouth daily. ), Disp: 30 tablet, Rfl: 5 atorvastatin (LIPITOR) 40 MG tablet, Take 1 tablet (40 mg total) by mouth at bedtime. Any generic statin from [facility] list is okay, Disp: 30 tablet, Rfl: 5 blood glucose meter kit and supplies KIT, Dispense based on patient and insurance preference. Use up to four times daily as directed. (FOR ICD-9 250.00, 250.01)., Disp: 1 each, Rfl: 0 carvedilol (COREG) 6.25 MG tablet, Take 1 tablet (6.25 mg total) by mouth 2 (two) times daily with a meal., Disp: 60 tablet, Rfl: 5 clopidogrel (PLAVIX) 75 MG tablet, Take 1 tablet (75 mg total) by mouth daily., Disp: 90 tablet, Rfl: 3 docusate sodium (COLACE) 50 MG capsule, Take 1 capsule (50 mg total) by mouth 2 (two) times daily., Disp: 10 capsule, Rfl: 0 ferrous sulfate 325 (65 FE) MG EC tablet, Take 1 tablet (325 mg total) by mouth 3 (three) times daily with meals., Disp: 90 tablet, Rfl: 0 glucose blood (RELION GLUCOSE TEST STRIPS) test strip, Use as instructed, Disp: 100 each, Rfl: 12 hydrALAZINE (APRESOLINE) 10 MG tablet, Take 1 tablet (10 mg total) by mouth 3 (three) times daily., Disp: 90 tablet, Rfl: 5 insulin glargine (LANTUS) 100 UNIT/ML injection, Inject 0.2 mLs (20 Units total) into the skin daily., Disp: 10 mL, Rfl: 5 INSULIN SYRINGE 1CC/29G (B-D INSULIN SYRINGE) 29G X 1/2" 1 ML MISC, 1 application by Does not apply route at bedtime., Disp: 30 each,  Rfl: 5 losartan (COZAAR) 50 MG tablet, Take 1 tablet (50 mg total) by mouth daily., Disp: 30 tablet, Rfl: 5 metFORMIN (GLUCOPHAGE XR) 500 MG 24 hr tablet, Take 1 tablet (500 mg total) by mouth 2 (two) times daily., Disp: 180 tablet, Rfl: 3 HYDROcodone-acetaminophen (NORCO/VICODIN) 5-325 MG tablet, Take 1 tablet by mouth every 6 (six) hours as needed. (Patient not taking: Reported on 02/07/2018), Disp: 15 tablet, Rfl: 0 ketorolac (TORADOL) 10 MG tablet, Take 1 tablet (10 mg total) by mouth every 8 (eight) hours as needed. (Patient not taking: Reported on 02/07/2018), Disp: 15 tablet, Rfl: 0 ondansetron (ZOFRAN ODT) 8 MG disintegrating tablet, Take 1 tablet (8 mg total) by mouth every 8 (eight) hours as needed for nausea or vomiting. (Patient not taking: Reported on 02/07/2018), Disp: 20 tablet, Rfl: 0  No current facility-administered medications for this visit.     Blood pressure (!) 162/94, pulse 96, height '5\' 2"'$  (1.575 m), weight 146 lb (66.2 kg), last menstrual period 01/22/2018.  Physical Exam: Vaginal exam normal post ablation  No CMT Uterus non tender  Diagnostic Tests:   Pathology: benign  Impression: S/p hysteroscopy uterine curettage endo ablation  Plan: 1  Follow up: 1  years  Florian Buff, MD

## 2018-02-11 ENCOUNTER — Encounter (INDEPENDENT_AMBULATORY_CARE_PROVIDER_SITE_OTHER): Payer: Self-pay | Admitting: Physician Assistant

## 2018-02-11 ENCOUNTER — Ambulatory Visit (INDEPENDENT_AMBULATORY_CARE_PROVIDER_SITE_OTHER): Payer: Self-pay | Admitting: Physician Assistant

## 2018-02-11 ENCOUNTER — Other Ambulatory Visit: Payer: Self-pay

## 2018-02-11 VITALS — BP 173/95 | HR 86 | Temp 98.2°F | Wt 145.0 lb

## 2018-02-11 DIAGNOSIS — D649 Anemia, unspecified: Secondary | ICD-10-CM

## 2018-02-11 DIAGNOSIS — I10 Essential (primary) hypertension: Secondary | ICD-10-CM

## 2018-02-11 DIAGNOSIS — Z23 Encounter for immunization: Secondary | ICD-10-CM

## 2018-02-11 NOTE — Patient Instructions (Signed)
Anemia Anemia is a condition in which you do not have enough red blood cells or hemoglobin. Hemoglobin is a substance in red blood cells that carries oxygen. When you do not have enough red blood cells or hemoglobin (are anemic), your body cannot get enough oxygen and your organs may not work properly. As a result, you may feel very tired or have other problems. What are the causes? Common causes of anemia include:  Excessive bleeding. Anemia can be caused by excessive bleeding inside or outside the body, including bleeding from the intestine or from periods in women.  Poor nutrition.  Long-lasting (chronic) kidney, thyroid, and liver disease.  Bone marrow disorders.  Cancer and treatments for cancer.  HIV (human immunodeficiency virus) and AIDS (acquired immunodeficiency syndrome).  Treatments for HIV and AIDS.  Spleen problems.  Blood disorders.  Infections, medicines, and autoimmune disorders that destroy red blood cells.  What are the signs or symptoms? Symptoms of this condition include:  Minor weakness.  Dizziness.  Headache.  Feeling heartbeats that are irregular or faster than normal (palpitations).  Shortness of breath, especially with exercise.  Paleness.  Cold sensitivity.  Indigestion.  Nausea.  Difficulty sleeping.  Difficulty concentrating.  Symptoms may occur suddenly or develop slowly. If your anemia is mild, you may not have symptoms. How is this diagnosed? This condition is diagnosed based on:  Blood tests.  Your medical history.  A physical exam.  Bone marrow biopsy.  Your health care provider may also check your stool (feces) for blood and may do additional testing to look for the cause of your bleeding. You may also have other tests, including:  Imaging tests, such as a CT scan or MRI.  Endoscopy.  Colonoscopy.  How is this treated? Treatment for this condition depends on the cause. If you continue to lose a lot of blood,  you may need to be treated at a hospital. Treatment may include:  Taking supplements of iron, vitamin B12, or folic acid.  Taking a hormone medicine (erythropoietin) that can help to stimulate red blood cell growth.  Having a blood transfusion. This may be needed if you lose a lot of blood.  Making changes to your diet.  Having surgery to remove your spleen.  Follow these instructions at home:  Take over-the-counter and prescription medicines only as told by your health care provider.  Take supplements only as told by your health care provider.  Follow any diet instructions that you were given.  Keep all follow-up visits as told by your health care provider. This is important. Contact a health care provider if:  You develop new bleeding anywhere in the body. Get help right away if:  You are very weak.  You are short of breath.  You have pain in your abdomen or chest.  You are dizzy or feel faint.  You have trouble concentrating.  You have bloody or black, tarry stools.  You vomit repeatedly or you vomit up blood. Summary  Anemia is a condition in which you do not have enough red blood cells or enough of a substance in your red blood cells that carries oxygen (hemoglobin).  Symptoms may occur suddenly or develop slowly.  If your anemia is mild, you may not have symptoms.  This condition is diagnosed with blood tests as well as a medical history and physical exam. Other tests may be needed.  Treatment for this condition depends on the cause of the anemia. This information is not intended to replace advice   given to you by your health care provider. Make sure you discuss any questions you have with your health care provider. Document Released: 12/28/2004 Document Revised: 12/22/2016 Document Reviewed: 12/22/2016 Elsevier Interactive Patient Education  [employer].

## 2018-02-11 NOTE — Progress Notes (Signed)
Subjective:  Patient ID: Tammy Bauer, female    DOB: 1966-06-10  Age: 52 y.o. MRN: [id number]  CC: amenia and general health  HPI Tammy Sheltonis a 52 y.o.RHDfemalewith a medical history of HTN, Cryptogenic stroke, DM2, tobacco abuse, and noncompliancepresents for f/u of anemia. Last Hgb 10.2 g/dL two weeks ago. Has been taking ferrous sulfate as directed. Had an uterine ablation done which has resolved the bleeding. BP noted to be elevated today. Pt says she has not had any of her medications today. Does not endorse any symptoms or complaints.      Outpatient Medications Prior to Visit  Medication Sig Dispense Refill  . acetaminophen (TYLENOL) 325 MG tablet Take 650 mg by mouth every 6 (six) hours as needed for mild pain or moderate pain.     Marland Kitchen amLODipine (NORVASC) 10 MG tablet Take 1 tablet (10 mg total) by mouth daily. TAKE ONE TABLET BY MOUTH ONCE DAILY (Patient taking differently: Take 10 mg by mouth daily. ) 30 tablet 5  . atorvastatin (LIPITOR) 40 MG tablet Take 1 tablet (40 mg total) by mouth at bedtime. Any generic statin from [facility] list is okay 30 tablet 5  . blood glucose meter kit and supplies KIT Dispense based on patient and insurance preference. Use up to four times daily as directed. (FOR ICD-9 250.00, 250.01). 1 each 0  . carvedilol (COREG) 6.25 MG tablet Take 1 tablet (6.25 mg total) by mouth 2 (two) times daily with a meal. 60 tablet 5  . clopidogrel (PLAVIX) 75 MG tablet Take 1 tablet (75 mg total) by mouth daily. 90 tablet 3  . docusate sodium (COLACE) 50 MG capsule Take 1 capsule (50 mg total) by mouth 2 (two) times daily. 10 capsule 0  . ferrous sulfate 325 (65 FE) MG EC tablet Take 1 tablet (325 mg total) by mouth 3 (three) times daily with meals. 90 tablet 0  . glucose blood (RELION GLUCOSE TEST STRIPS) test strip Use as instructed 100 each 12  . hydrALAZINE (APRESOLINE) 10 MG tablet Take 1 tablet (10 mg total) by mouth 3 (three) times daily. 90 tablet 5   . insulin glargine (LANTUS) 100 UNIT/ML injection Inject 0.2 mLs (20 Units total) into the skin daily. 10 mL 5  . INSULIN SYRINGE 1CC/29G (B-D INSULIN SYRINGE) 29G X 1/2" 1 ML MISC 1 application by Does not apply route at bedtime. 30 each 5  . losartan (COZAAR) 50 MG tablet Take 1 tablet (50 mg total) by mouth daily. 30 tablet 5  . metFORMIN (GLUCOPHAGE XR) 500 MG 24 hr tablet Take 1 tablet (500 mg total) by mouth 2 (two) times daily. 180 tablet 3  . HYDROcodone-acetaminophen (NORCO/VICODIN) 5-325 MG tablet Take 1 tablet by mouth every 6 (six) hours as needed. (Patient not taking: Reported on 02/07/2018) 15 tablet 0  . ketorolac (TORADOL) 10 MG tablet Take 1 tablet (10 mg total) by mouth every 8 (eight) hours as needed. (Patient not taking: Reported on 02/07/2018) 15 tablet 0  . ondansetron (ZOFRAN ODT) 8 MG disintegrating tablet Take 1 tablet (8 mg total) by mouth every 8 (eight) hours as needed for nausea or vomiting. (Patient not taking: Reported on 02/07/2018) 20 tablet 0   No facility-administered medications prior to visit.      ROS Review of Systems  Constitutional: Negative for chills, fever and malaise/fatigue.  Eyes: Negative for blurred vision.  Respiratory: Negative for shortness of breath.   Cardiovascular: Negative for chest pain and palpitations.  Gastrointestinal:  Negative for abdominal pain and nausea.  Genitourinary: Negative for dysuria and hematuria.  Musculoskeletal: Negative for joint pain and myalgias.  Skin: Negative for rash.  Neurological: Negative for tingling and headaches.  Psychiatric/Behavioral: Negative for depression. The patient is not nervous/anxious.     Objective:  BP (!) 173/95 (BP Location: Left Arm, Patient Position: Sitting, Cuff Size: Normal)   Pulse 86   Temp 98.2 F (36.8 C) (Oral)   Wt 145 lb (65.8 kg)   LMP 01/22/2018   SpO2 97%   BMI 26.52 kg/m   BP/Weight 02/11/2018 02/07/2018 1/44/8185  Systolic BP 631 497 026  Diastolic BP 95 94 85   Wt. (Lbs) 145 146 148  BMI 26.52 26.7 26.22      Physical Exam  Constitutional: She is oriented to person, place, and time.  Well developed, well nourished, NAD, polite  HENT:  Head: Normocephalic and atraumatic.  Eyes: Conjunctivae are normal. No scleral icterus.  Neck: Normal range of motion. Neck supple.  Cardiovascular: Normal rate, regular rhythm and normal heart sounds.  Pulmonary/Chest: Effort normal and breath sounds normal.  Musculoskeletal: She exhibits no edema.  Neurological: She is alert and oriented to person, place, and time. No cranial nerve deficit. Coordination normal.  Skin: Skin is warm and dry. No rash noted. No erythema. No pallor.  Psychiatric: She has a normal mood and affect. Her behavior is normal. Thought content normal.  Vitals reviewed.    Assessment & Plan:    1. Anemia, unspecified type - CBC with Differential  2. Hypertension, unspecified type - Pt advised to take medications as directed. Recheck in two weeks when she returns for PAP and Tdap.   3. Need for prophylactic vaccination against Streptococcus pneumoniae (pneumococcus) - Pneumococcal polysaccharide vaccine 23-valent greater than or equal to 2yo subcutaneous/IM  4. Need for prophylactic vaccination and inoculation against influenza - Flu Vaccine QUAD 6+ mos PF IM (Fluarix Quad PF)     Follow-up: Return in about 2 weeks (around 02/25/2018) for PAP and TDap.   Clent Demark PA

## 2018-02-12 ENCOUNTER — Telehealth (INDEPENDENT_AMBULATORY_CARE_PROVIDER_SITE_OTHER): Payer: Self-pay

## 2018-02-12 LAB — CBC WITH DIFFERENTIAL/PLATELET
Basophils Absolute: 0 10*3/uL (ref 0.0–0.2)
Basos: 0 %
EOS (ABSOLUTE): 0 10*3/uL (ref 0.0–0.4)
Eos: 0 %
HEMOGLOBIN: 13.4 g/dL (ref 11.1–15.9)
Hematocrit: 41 % (ref 34.0–46.6)
IMMATURE GRANS (ABS): 0 10*3/uL (ref 0.0–0.1)
IMMATURE GRANULOCYTES: 0 %
LYMPHS: 17 %
Lymphocytes Absolute: 1.6 10*3/uL (ref 0.7–3.1)
MCH: 30.5 pg (ref 26.6–33.0)
MCHC: 32.7 g/dL (ref 31.5–35.7)
MCV: 93 fL (ref 79–97)
MONOCYTES: 5 %
Monocytes Absolute: 0.5 10*3/uL (ref 0.1–0.9)
NEUTROS PCT: 78 %
Neutrophils Absolute: 7.6 10*3/uL — ABNORMAL HIGH (ref 1.4–7.0)
Platelets: 558 10*3/uL — ABNORMAL HIGH (ref 150–379)
RBC: 4.4 x10E6/uL (ref 3.77–5.28)
RDW: 18.3 % — ABNORMAL HIGH (ref 12.3–15.4)
WBC: 9.7 10*3/uL (ref 3.4–10.8)

## 2018-02-12 NOTE — Telephone Encounter (Signed)
Left patient a message detailing results show that she is no longer anemic and to call the office with any questions. Maryjean Mornempestt S Roberts, CMA

## 2018-02-12 NOTE — Telephone Encounter (Signed)
-----   Message from Loletta Specteroger David Gomez, PA-C sent at 02/12/2018  8:53 AM EDT ----- No longer anemic.

## 2018-02-25 ENCOUNTER — Other Ambulatory Visit (INDEPENDENT_AMBULATORY_CARE_PROVIDER_SITE_OTHER): Payer: Self-pay | Admitting: Physician Assistant

## 2018-02-25 ENCOUNTER — Other Ambulatory Visit: Payer: Self-pay

## 2018-02-25 ENCOUNTER — Ambulatory Visit (INDEPENDENT_AMBULATORY_CARE_PROVIDER_SITE_OTHER): Payer: Self-pay | Admitting: Physician Assistant

## 2018-02-25 ENCOUNTER — Other Ambulatory Visit (HOSPITAL_COMMUNITY)
Admission: RE | Admit: 2018-02-25 | Discharge: 2018-02-25 | Disposition: A | Discharge status: Home / Self Care | Payer: Self-pay | Source: Ambulatory Visit | Attending: Physician Assistant | Admitting: Physician Assistant

## 2018-02-25 ENCOUNTER — Encounter (INDEPENDENT_AMBULATORY_CARE_PROVIDER_SITE_OTHER): Payer: Self-pay | Admitting: Physician Assistant

## 2018-02-25 VITALS — BP 147/93 | HR 82 | Temp 97.8°F | Ht 64.0 in | Wt 148.6 lb

## 2018-02-25 DIAGNOSIS — Z23 Encounter for immunization: Secondary | ICD-10-CM

## 2018-02-25 DIAGNOSIS — Z760 Encounter for issue of repeat prescription: Secondary | ICD-10-CM

## 2018-02-25 DIAGNOSIS — Z124 Encounter for screening for malignant neoplasm of cervix: Secondary | ICD-10-CM

## 2018-02-25 DIAGNOSIS — I10 Essential (primary) hypertension: Secondary | ICD-10-CM

## 2018-02-25 MED ORDER — LOSARTAN POTASSIUM 50 MG PO TABS: 50.0000 mg | ORAL_TABLET | Freq: Every day | ORAL | 5 refills | Status: DC

## 2018-02-25 MED ORDER — CARVEDILOL 6.25 MG PO TABS: 6.2500 mg | ORAL_TABLET | Freq: Two times a day (BID) | ORAL | 5 refills | Status: DC

## 2018-02-25 MED ORDER — HYDRALAZINE HCL 10 MG PO TABS: 10.0000 mg | ORAL_TABLET | Freq: Three times a day (TID) | ORAL | 5 refills | Status: DC

## 2018-02-25 MED ORDER — AMLODIPINE BESYLATE 10 MG PO TABS: 10.0000 mg | ORAL_TABLET | Freq: Every day | ORAL | 5 refills | Status: DC

## 2018-02-25 MED ORDER — CLONIDINE HCL 0.1 MG PO TABS
0.2000 mg | ORAL_TABLET | Freq: Once | ORAL | Status: AC
Start: 2018-02-25 — End: 2018-02-25
  Administered 2018-02-25: 0.2 mg via ORAL

## 2018-02-25 MED FILL — AMLODIPINE BESYLATE 10 MG T: 10 | 30 days supply | Qty: 30 | Fill #4

## 2018-02-25 MED FILL — hydrALAZINE HCL 10 MG TABS: 10 | 30 days supply | Qty: 90 | Fill #0

## 2018-02-25 MED FILL — CLOPIDOGREL 75 MG TABLET: 75 | 30 days supply | Qty: 30 | Fill #3

## 2018-02-25 MED FILL — CARVEDILOL 6.25 MG TABLET: 6.25 | 30 days supply | Qty: 60 | Fill #4

## 2018-02-25 MED FILL — LOSARTAN POTASSIUM 50 MG TA: 50 | 30 days supply | Qty: 30 | Fill #3

## 2018-02-25 NOTE — Patient Instructions (Signed)

## 2018-02-25 NOTE — Progress Notes (Signed)
Subjective:  Patient ID: Tammy Bauer, female    DOB: [birth year]  Age: 52 y.o. MRN: [id number]  CC: PAP and Tdap  HPI Tammy Sheltonis a 52 y.o.RHDfemalewith a medical history of HTN, Cryptogenic stroke, DM2, tobacco abuse, and noncompliancepresents for a PAP, Tdap administration, and medication refill. Reports she is out of one of her anti-hypertensives that is 10 mg, does not know name. BP noted to be elevated today. Says she took her available anti-hypertensives today. Does not endorse CP, palpitations, back pain, arm pain, neck pain, jaw pain, edema, tingling, numbness, weakness, HA, SOB, abdominal pain, f/c/n/v, rash, or GI/GU sxs.       Outpatient Medications Prior to Visit  Medication Sig Dispense Refill  . atorvastatin (LIPITOR) 40 MG tablet Take 1 tablet (40 mg total) by mouth at bedtime. Any generic statin from [facility] list is okay 30 tablet 5  . blood glucose meter kit and supplies KIT Dispense based on patient and insurance preference. Use up to four times daily as directed. (FOR ICD-9 250.00, 250.01). 1 each 0  . carvedilol (COREG) 6.25 MG tablet Take 1 tablet (6.25 mg total) by mouth 2 (two) times daily with a meal. 60 tablet 5  . clopidogrel (PLAVIX) 75 MG tablet Take 1 tablet (75 mg total) by mouth daily. 90 tablet 3  . glucose blood (RELION GLUCOSE TEST STRIPS) test strip Use as instructed 100 each 12  . hydrALAZINE (APRESOLINE) 10 MG tablet Take 1 tablet (10 mg total) by mouth 3 (three) times daily. 90 tablet 5  . insulin glargine (LANTUS) 100 UNIT/ML injection Inject 0.2 mLs (20 Units total) into the skin daily. 10 mL 5  . INSULIN SYRINGE 1CC/29G (B-D INSULIN SYRINGE) 29G X 1/2" 1 ML MISC 1 application by Does not apply route at bedtime. 30 each 5  . losartan (COZAAR) 50 MG tablet Take 1 tablet (50 mg total) by mouth daily. 30 tablet 5  . metFORMIN (GLUCOPHAGE XR) 500 MG 24 hr tablet Take 1 tablet (500 mg total) by mouth 2 (two) times daily. 180 tablet 3  .  acetaminophen (TYLENOL) 325 MG tablet Take 650 mg by mouth every 6 (six) hours as needed for mild pain or moderate pain.     Marland Kitchen amLODipine (NORVASC) 10 MG tablet Take 1 tablet (10 mg total) by mouth daily. TAKE ONE TABLET BY MOUTH ONCE DAILY (Patient not taking: Reported on 02/25/2018) 30 tablet 5  . docusate sodium (COLACE) 50 MG capsule Take 1 capsule (50 mg total) by mouth 2 (two) times daily. (Patient not taking: Reported on 02/25/2018) 10 capsule 0  . ferrous sulfate 325 (65 FE) MG EC tablet Take 1 tablet (325 mg total) by mouth 3 (three) times daily with meals. (Patient not taking: Reported on 02/25/2018) 90 tablet 0   No facility-administered medications prior to visit.      ROS Review of Systems  Constitutional: Negative for chills, fever and malaise/fatigue.  Eyes: Negative for blurred vision.  Respiratory: Negative for shortness of breath.   Cardiovascular: Negative for chest pain and palpitations.  Gastrointestinal: Negative for abdominal pain and nausea.  Genitourinary: Negative for dysuria and hematuria.  Musculoskeletal: Negative for joint pain and myalgias.  Skin: Negative for rash.  Neurological: Negative for tingling and headaches.  Psychiatric/Behavioral: Negative for depression. The patient is not nervous/anxious.     Objective:  BP (!) 214/108 (BP Location: Left Arm, Patient Position: Sitting, Cuff Size: Normal)   Pulse 82   Temp 97.8 F (36.6 C) (  Oral)   Ht '5\' 4"'$  (1.626 m)   Wt 148 lb 9.6 oz (67.4 kg)   SpO2 93%   BMI 25.51 kg/m   BP/Weight 02/25/2018 6/38/4665 08/12/3569  Systolic BP 177 939 030  Diastolic BP 092 95 94  Wt. (Lbs) 148.6 145 146  BMI 25.51 26.52 26.7      Physical Exam  Constitutional: She is oriented to person, place, and time.  Well developed, well nourished, NAD, polite  HENT:  Head: Normocephalic and atraumatic.  Eyes: No scleral icterus.  Neck: Neck supple.  Cardiovascular: Normal rate, regular rhythm and normal heart sounds.  No  LE edema bilaterally  Pulmonary/Chest: Effort normal and breath sounds normal. No respiratory distress. She has no wheezes. She has no rales.  Genitourinary:  Genitourinary Comments: No vaginal drainage. Cervix mildly erythematous around os, no motion tenderness. No adnexal mass or tenderness to palpation bilaterally. No uterine mass or tenderness.   Musculoskeletal: She exhibits no edema.  Neurological: She is alert and oriented to person, place, and time. No cranial nerve deficit. Coordination normal.  Skin: Skin is warm and dry. No rash noted. No erythema. No pallor.  Psychiatric: She has a normal mood and affect. Her behavior is normal. Thought content normal.  Vitals reviewed.    Assessment & Plan:   1. Screening for cervical cancer - Cytology PAP  2. Hypertension, unspecified type - Administered cloNIDine (CATAPRES) tablet 0.2 mg - losartan (COZAAR) 50 MG tablet; Take 1 tablet (50 mg total) by mouth daily.  Dispense: 30 tablet; Refill: 5 - hydrALAZINE (APRESOLINE) 10 MG tablet; Take 1 tablet (10 mg total) by mouth 3 (three) times daily.  Dispense: 90 tablet; Refill: 5 - carvedilol (COREG) 6.25 MG tablet; Take 1 tablet (6.25 mg total) by mouth 2 (two) times daily with a meal.  Dispense: 60 tablet; Refill: 5 - amLODipine (NORVASC) 10 MG tablet; Take 1 tablet (10 mg total) by mouth daily. TAKE ONE TABLET BY MOUTH ONCE DAILY  Dispense: 30 tablet; Refill: 5  3. Need for Tdap vaccination - Tdap vaccine greater than or equal to 7yo IM; Future     Meds ordered this encounter  Medications  . cloNIDine (CATAPRES) tablet 0.2 mg  . losartan (COZAAR) 50 MG tablet    Sig: Take 1 tablet (50 mg total) by mouth daily.    Dispense:  30 tablet    Refill:  5    Order Specific Question:   Supervising Provider    Answer:   Tresa Garter [id number]  . hydrALAZINE (APRESOLINE) 10 MG tablet    Sig: Take 1 tablet (10 mg total) by mouth 3 (three) times daily.    Dispense:  90 tablet     Refill:  5    Order Specific Question:   Supervising Provider    Answer:   Tresa Garter [id number]  . carvedilol (COREG) 6.25 MG tablet    Sig: Take 1 tablet (6.25 mg total) by mouth 2 (two) times daily with a meal.    Dispense:  60 tablet    Refill:  5    Order Specific Question:   Supervising Provider    Answer:   Tresa Garter [id number]  . amLODipine (NORVASC) 10 MG tablet    Sig: Take 1 tablet (10 mg total) by mouth daily. TAKE ONE TABLET BY MOUTH ONCE DAILY    Dispense:  30 tablet    Refill:  5    Order Specific Question:   Supervising Provider  AnswerTresa Garter [[id number]]    Follow-up: 4 weeks HTN  Clent Demark PA

## 2018-02-27 LAB — CYTOLOGY - PAP
Bacterial vaginitis: NEGATIVE
Candida vaginitis: NEGATIVE
Chlamydia: NEGATIVE
DIAGNOSIS: NEGATIVE
Neisseria Gonorrhea: NEGATIVE
Trichomonas: POSITIVE — AB

## 2018-02-28 ENCOUNTER — Other Ambulatory Visit (INDEPENDENT_AMBULATORY_CARE_PROVIDER_SITE_OTHER): Payer: Self-pay | Admitting: Physician Assistant

## 2018-02-28 ENCOUNTER — Ambulatory Visit (INDEPENDENT_AMBULATORY_CARE_PROVIDER_SITE_OTHER): Payer: Self-pay | Admitting: *Deleted

## 2018-02-28 DIAGNOSIS — I639 Cerebral infarction, unspecified: Secondary | ICD-10-CM

## 2018-02-28 DIAGNOSIS — A599 Trichomoniasis, unspecified: Secondary | ICD-10-CM

## 2018-02-28 MED ORDER — METRONIDAZOLE 500 MG PO TABS: 500.0000 mg | ORAL_TABLET | Freq: Two times a day (BID) | ORAL | 0 refills | Status: AC

## 2018-03-01 ENCOUNTER — Telehealth (INDEPENDENT_AMBULATORY_CARE_PROVIDER_SITE_OTHER): Payer: Self-pay

## 2018-03-01 MED FILL — metroNIDAZOLE 500 MG TABS: 500 | 7 days supply | Qty: 14 | Fill #0

## 2018-03-01 NOTE — Telephone Encounter (Signed)
Left message asking patient to return call to the office. Idris Edmundson S Jerrid Forgette, CMA  

## 2018-03-01 NOTE — Progress Notes (Signed)
[facility] Summary Report / Loop Recorder 

## 2018-03-01 NOTE — Telephone Encounter (Signed)
-----   Message from Loletta Specteroger David Gomez, PA-C sent at 02/28/2018  5:26 PM EDT ----- Trichomonas. I have sent metronidazole.

## 2018-03-01 NOTE — Telephone Encounter (Signed)
Patient returned call and was provided with pap results, trichomonas, explained that this is a sexually transmitted disease and metronidazole(antibiotic) has been sent to her pharmacy. Patient expressed understanding. Maryjean Mornempestt S Roberts, CMA

## 2018-03-01 NOTE — Telephone Encounter (Signed)
-----   Message from Roger David Gomez, PA-C sent at 02/28/2018  5:26 PM EDT ----- Trichomonas. I have sent metronidazole. 

## 2018-03-04 LAB — CUP PACEART REMOTE DEVICE CHECK
Date Time Interrogation Session: 20190224011109
MDC IDC PG IMPLANT DT: 20180725

## 2018-03-26 MED FILL — CLOPIDOGREL 75 MG TABLET: 75 | 30 days supply | Qty: 30 | Fill #4

## 2018-03-26 MED FILL — LOSARTAN POTASSIUM 50 MG TA: 50 | 30 days supply | Qty: 30 | Fill #4

## 2018-03-26 MED FILL — AMLODIPINE BESYLATE 10 MG T: 10 | 30 days supply | Qty: 30 | Fill #5

## 2018-03-26 MED FILL — ATORVASTATIN 40 MG TABLET: 40 | 30 days supply | Qty: 30 | Fill #5

## 2018-04-02 ENCOUNTER — Ambulatory Visit (INDEPENDENT_AMBULATORY_CARE_PROVIDER_SITE_OTHER): Payer: Self-pay | Admitting: *Deleted

## 2018-04-02 DIAGNOSIS — I639 Cerebral infarction, unspecified: Secondary | ICD-10-CM

## 2018-04-03 NOTE — Progress Notes (Signed)
[facility] Summary Report / Loop Recorder 

## 2018-04-05 LAB — CUP PACEART REMOTE DEVICE CHECK
Date Time Interrogation Session: 20190329013834
Implantable Pulse Generator Implant Date: 20180725

## 2018-04-29 LAB — CUP PACEART REMOTE DEVICE CHECK
Implantable Pulse Generator Implant Date: 20180725
MDC IDC SESS DTM: 20190501020518

## 2018-04-30 MED FILL — AMLODIPINE BESYLATE 10 MG T: 10 | 30 days supply | Qty: 30 | Fill #0

## 2018-04-30 MED FILL — CLOPIDOGREL 75 MG TABLET: 75 | 30 days supply | Qty: 30 | Fill #5

## 2018-04-30 MED FILL — hydrALAZINE HCL 10 MG TABS: 10 | 30 days supply | Qty: 90 | Fill #1

## 2018-04-30 MED FILL — LOSARTAN POTASSIUM 50 MG TA: 50 | 30 days supply | Qty: 30 | Fill #5

## 2018-05-06 ENCOUNTER — Ambulatory Visit (INDEPENDENT_AMBULATORY_CARE_PROVIDER_SITE_OTHER): Payer: Self-pay | Admitting: *Deleted

## 2018-05-06 DIAGNOSIS — I639 Cerebral infarction, unspecified: Secondary | ICD-10-CM

## 2018-05-06 NOTE — Progress Notes (Signed)
[facility] Summary Report / Loop Recorder 

## 2018-05-07 ENCOUNTER — Ambulatory Visit: Payer: Self-pay

## 2018-06-03 MED FILL — AMLODIPINE BESYLATE 10 MG T: 10 | 30 days supply | Qty: 30 | Fill #1

## 2018-06-03 MED FILL — hydrALAZINE HCL 10 MG TABS: 10 | 30 days supply | Qty: 90 | Fill #2

## 2018-06-03 MED FILL — CLOPIDOGREL 75 MG TABLET: 75 | 30 days supply | Qty: 30 | Fill #6

## 2018-06-05 MED FILL — LOSARTAN POTASSIUM 50 MG TA: 50 | 30 days supply | Qty: 30 | Fill #0

## 2018-06-06 IMAGING — US US CAROTID DUPLEX BILAT
1 series · 13 of 24 positions shown · non-contrast
Comparison: None.

CLINICAL DATA: Acute CVA. Visual disturbance. Syncopal episode.
History of hypertension, hyperlipidemia, diabetes and smoking.

EXAM:
BILATERAL CAROTID DUPLEX ULTRASOUND
TECHNIQUE: Gray scale imaging, color Doppler and duplex ultrasound were
performed of bilateral carotid and vertebral arteries in the neck.

[Series 1: us carotid duplex bilat · 0.05mm/px · 13 of 69 slices shown]
[im 1/69]
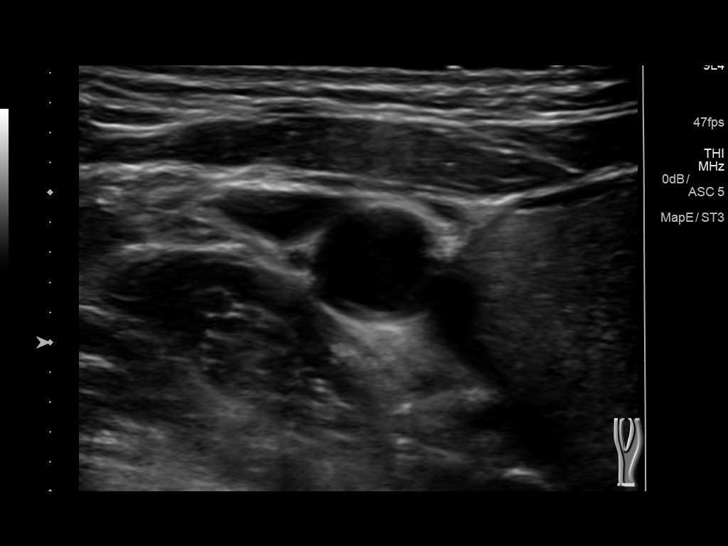
[im 6/69]
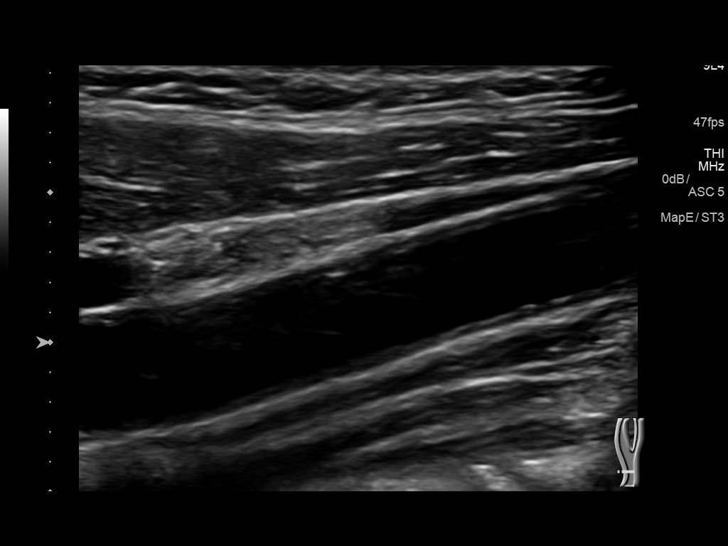
[im 12/69]
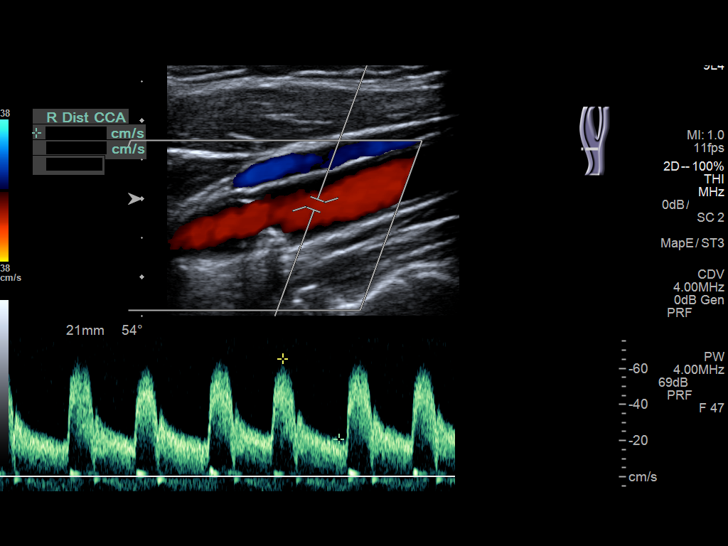
[im 18/69]
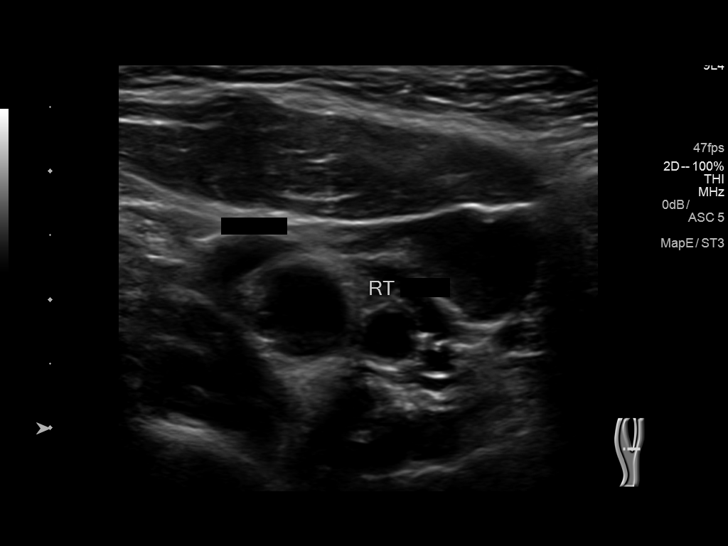
[im 24/69]
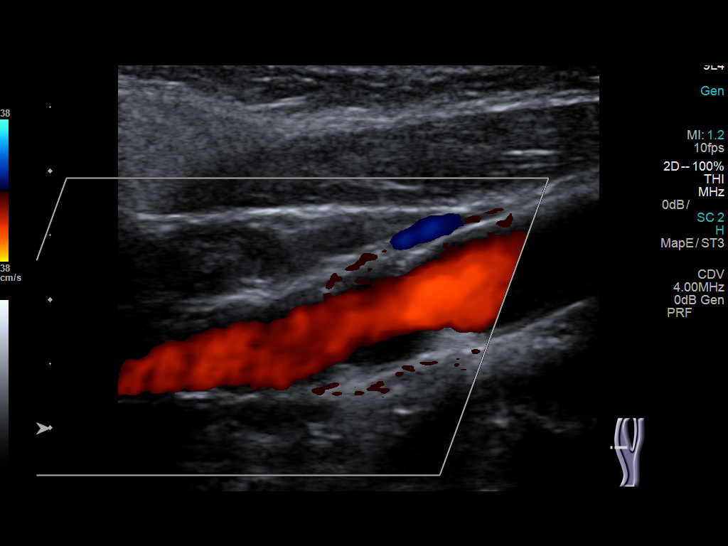
[im 30/69]
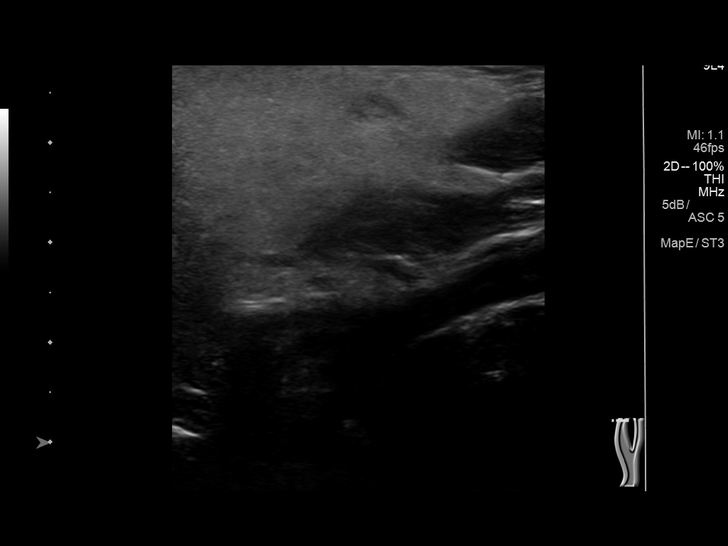
[im 36/69]
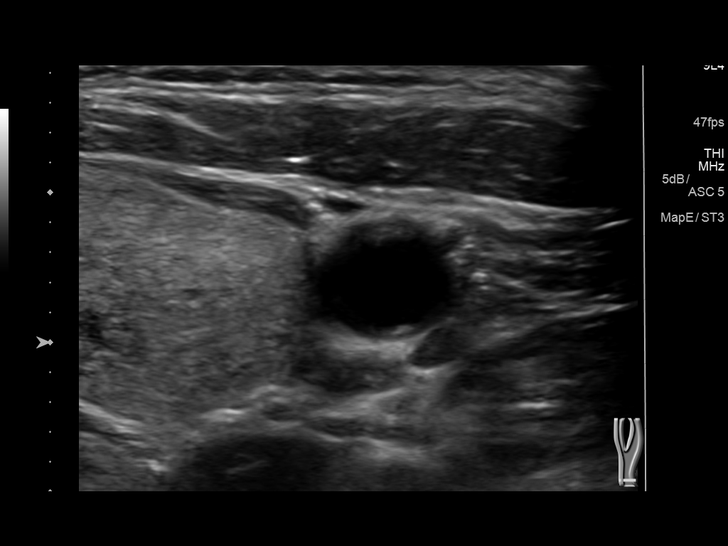
[im 39/69]
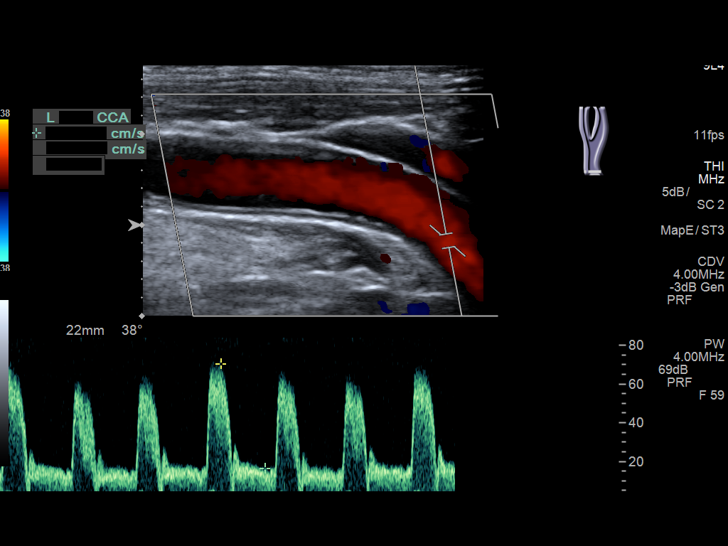
[im 45/69]
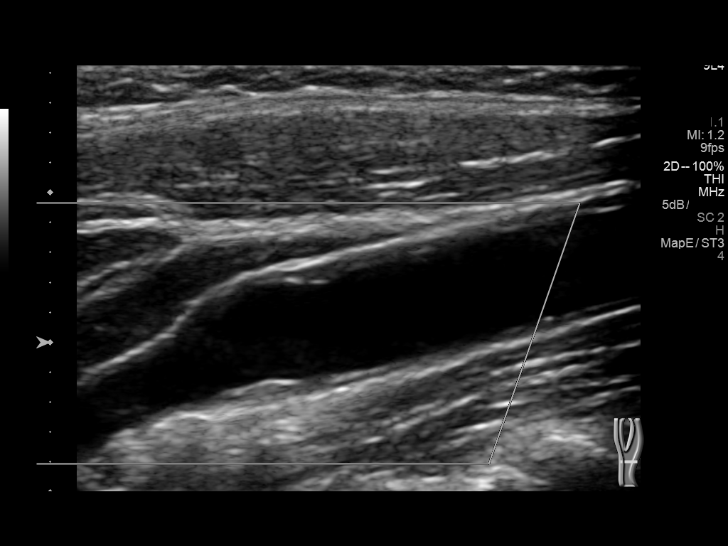
[im 51/69]
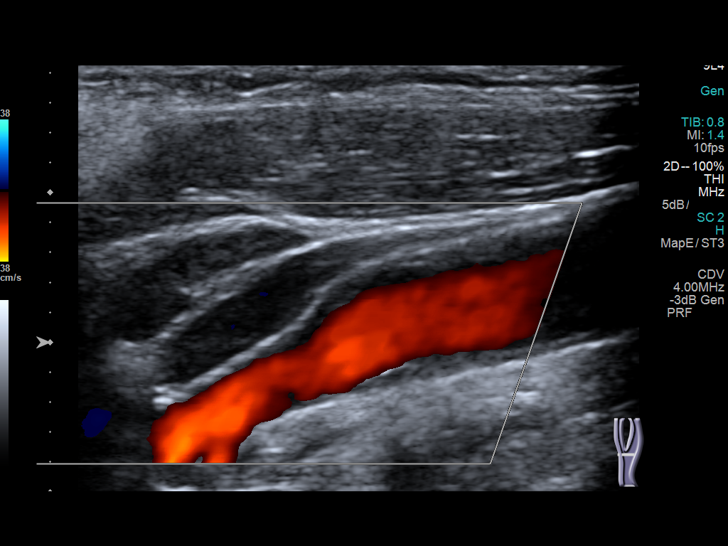
[im 57/69]
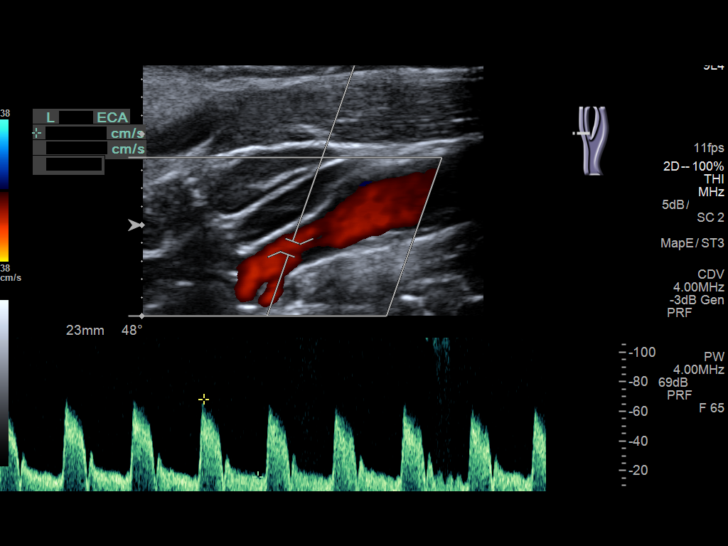
[im 63/69]
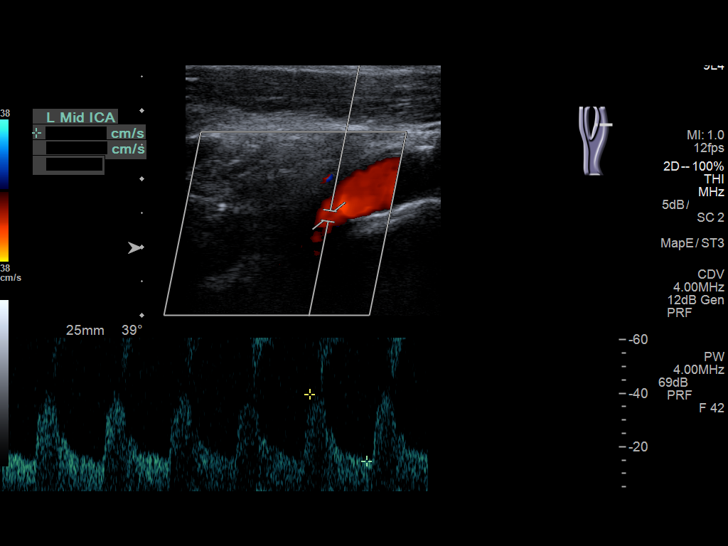
[im 69/69]
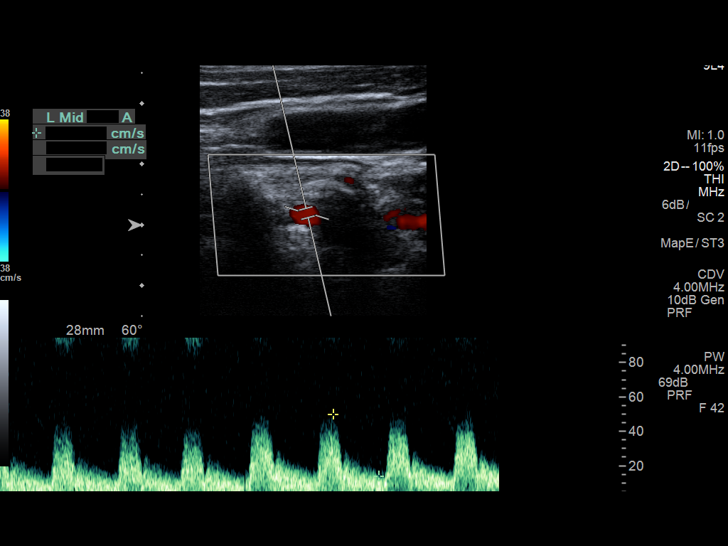

[13 of 24 positions shown; findings below may reference images not displayed]

FINDINGS: Criteria: Quantification of carotid stenosis is based on velocity
parameters that correlate the residual internal carotid diameter
with NASCET-based stenosis levels, using the diameter of the distal
internal carotid lumen as the denominator for stenosis measurement.

The following velocity measurements were obtained:

RIGHT

ICA:  72/31 cm/sec

CCA:  64/22 cm/sec

SYSTOLIC ICA/CCA RATIO:

DIASTOLIC ICA/CCA RATIO:

ECA:  92 cm/sec

LEFT

ICA:  51/17 cm/sec

CCA:  71/21 cm/sec

SYSTOLIC ICA/CCA RATIO:

DIASTOLIC ICA/CCA RATIO:

ECA:  68 cm/sec

RIGHT CAROTID ARTERY: There is a minimal amount of eccentric mixed
echogenic plaque seen scattered throughout the right common carotid
artery. There is a moderate amount of eccentric mixed echogenic
plaque within the right carotid bulb (image 17), extending to
involve the origin and proximal aspects of the right internal
carotid artery (image 25), not definitely resulting in elevated peak
systolic velocities within the interrogated course the right
internal carotid artery to suggest a hemodynamically significant
stenosis. Elevated peak systolic velocity within the distal aspect
the right internal carotid artery is felt to be factitiously
elevated due to sampling at a location of turbulent flow and
tortuosity.

RIGHT VERTEBRAL ARTERY:  Antegrade Flow

LEFT CAROTID ARTERY: There is a minimal amount of intimal thickening
throughout the left common carotid artery. There is a minimal amount
of intimal thickening and hypoechoic plaque within the left carotid
bulb (image 52), extending to involve the origin and proximal
aspects of the left internal carotid artery (image 60), not
resulting in elevated peak systolic velocities within the
interrogated course of the left internal carotid artery to suggest a
hemodynamically significant stenosis.

LEFT VERTEBRAL ARTERY:  Antegrade flow
IMPRESSION: Minimal to moderate amount of bilateral atherosclerotic plaque,
right greater than left, not resulting in a hemodynamically
significant stenosis within either internal carotid artery.

## 2018-06-10 ENCOUNTER — Ambulatory Visit (INDEPENDENT_AMBULATORY_CARE_PROVIDER_SITE_OTHER): Payer: Self-pay | Admitting: *Deleted

## 2018-06-10 DIAGNOSIS — I639 Cerebral infarction, unspecified: Secondary | ICD-10-CM

## 2018-06-11 NOTE — Progress Notes (Signed)
[facility] Summary Report / Loop Recorder 

## 2018-06-12 LAB — CUP PACEART REMOTE DEVICE CHECK
Date Time Interrogation Session: 20190603023627
MDC IDC PG IMPLANT DT: 20180725

## 2018-07-10 ENCOUNTER — Ambulatory Visit (INDEPENDENT_AMBULATORY_CARE_PROVIDER_SITE_OTHER): Payer: Self-pay | Admitting: *Deleted

## 2018-07-10 DIAGNOSIS — I639 Cerebral infarction, unspecified: Secondary | ICD-10-CM

## 2018-07-11 NOTE — Progress Notes (Signed)
[facility] Summary Report / Loop Recorder 

## 2018-07-18 LAB — CUP PACEART REMOTE DEVICE CHECK
Date Time Interrogation Session: 20190706023947
MDC IDC PG IMPLANT DT: 20180725

## 2018-07-19 MED FILL — hydrALAZINE HCL 10 MG TABS: 10 | 30 days supply | Qty: 90 | Fill #3

## 2018-07-19 MED FILL — AMLODIPINE BESYLATE 10 MG T: 10 | 30 days supply | Qty: 30 | Fill #2

## 2018-07-19 MED FILL — CLOPIDOGREL 75 MG TABLET: 75 | 30 days supply | Qty: 30 | Fill #7

## 2018-07-19 MED FILL — LOSARTAN POTASSIUM 50 MG TA: 50 | 30 days supply | Qty: 30 | Fill #1

## 2018-07-22 ENCOUNTER — Encounter (INDEPENDENT_AMBULATORY_CARE_PROVIDER_SITE_OTHER): Payer: Self-pay | Admitting: Physician Assistant

## 2018-07-22 ENCOUNTER — Ambulatory Visit (INDEPENDENT_AMBULATORY_CARE_PROVIDER_SITE_OTHER): Payer: Self-pay | Admitting: Physician Assistant

## 2018-07-22 VITALS — BP 162/97 | HR 81 | Temp 97.6°F | Ht 62.0 in | Wt 141.2 lb

## 2018-07-22 DIAGNOSIS — Z1211 Encounter for screening for malignant neoplasm of colon: Secondary | ICD-10-CM

## 2018-07-22 DIAGNOSIS — Z23 Encounter for immunization: Secondary | ICD-10-CM

## 2018-07-22 DIAGNOSIS — Z1231 Encounter for screening mammogram for malignant neoplasm of breast: Secondary | ICD-10-CM

## 2018-07-22 DIAGNOSIS — E1159 Type 2 diabetes mellitus with other circulatory complications: Secondary | ICD-10-CM

## 2018-07-22 DIAGNOSIS — Z1239 Encounter for other screening for malignant neoplasm of breast: Secondary | ICD-10-CM

## 2018-07-22 DIAGNOSIS — Z794 Long term (current) use of insulin: Secondary | ICD-10-CM

## 2018-07-22 LAB — POCT GLYCOSYLATED HEMOGLOBIN (HGB A1C): HbA1c POC (<> result, manual entry): 9.9 % (ref 4.0–5.6)

## 2018-07-22 LAB — GLUCOSE, POCT (MANUAL RESULT ENTRY): POC Glucose: 162 mg/dl — AB (ref 70–99)

## 2018-07-22 MED ORDER — LOSARTAN POTASSIUM 100 MG PO TABS: 100.0000 mg | ORAL_TABLET | Freq: Every day | ORAL | 5 refills | Status: DC

## 2018-07-22 MED ORDER — HYDRALAZINE HCL 10 MG PO TABS: 10.0000 mg | ORAL_TABLET | Freq: Three times a day (TID) | ORAL | 5 refills | Status: DC

## 2018-07-22 MED ORDER — ISOSORBIDE MONONITRATE ER 30 MG PO TB24: 30.0000 mg | ORAL_TABLET | Freq: Every day | ORAL | 5 refills | Status: DC

## 2018-07-22 MED ORDER — INSULIN GLARGINE 100 UNIT/ML ~~LOC~~ SOLN: 40.0000 [IU] | Freq: Every day | SUBCUTANEOUS | 5 refills | Status: DC

## 2018-07-22 MED ORDER — CARVEDILOL 6.25 MG PO TABS: 6.2500 mg | ORAL_TABLET | Freq: Two times a day (BID) | ORAL | 5 refills | Status: DC

## 2018-07-22 MED ORDER — AMLODIPINE BESYLATE 10 MG PO TABS: 10.0000 mg | ORAL_TABLET | Freq: Every day | ORAL | 5 refills | Status: DC

## 2018-07-22 NOTE — Progress Notes (Signed)
Subjective:  Patient ID: Tammy Bauer, female    DOB: 20-Mar-1966  Age: 52 y.o. MRN: [id number]  CC: DM f/u  HPI  Tammy Sheltonis a 52 y.o.RHDfemalewith a medical history of HTN, Cryptogenic stroke, DM2, tobacco abuse, and noncompliancepresents for f/u of DM2. Last A1c 6.6% five months ago. A1c 9.9% today. Says she has not taken Metformin XR for several months due to upset stomach and diarrhea. Endorses visual blurring attributed to stroke. Does not endorse polydipsia, polyuria, polyphagia, tingling, numbness, fatigue, CP, palpitations, SOB, HA, abdominal pain, f/c/n/v, rash, swelling, or GI/GU sxs.     Outpatient Medications Prior to Visit  Medication Sig Dispense Refill  . acetaminophen (TYLENOL) 325 MG tablet Take 650 mg by mouth every 6 (six) hours as needed for mild pain or moderate pain.     Marland Kitchen amLODipine (NORVASC) 10 MG tablet Take 1 tablet (10 mg total) by mouth daily. TAKE ONE TABLET BY MOUTH ONCE DAILY 30 tablet 5  . atorvastatin (LIPITOR) 40 MG tablet Take 1 tablet (40 mg total) by mouth at bedtime. Any generic statin from [facility] list is okay 30 tablet 5  . blood glucose meter kit and supplies KIT Dispense based on patient and insurance preference. Use up to four times daily as directed. (FOR ICD-9 250.00, 250.01). 1 each 0  . carvedilol (COREG) 6.25 MG tablet Take 1 tablet (6.25 mg total) by mouth 2 (two) times daily with a meal. 60 tablet 5  . clopidogrel (PLAVIX) 75 MG tablet Take 1 tablet (75 mg total) by mouth daily. 90 tablet 3  . docusate sodium (COLACE) 50 MG capsule Take 1 capsule (50 mg total) by mouth 2 (two) times daily. (Patient not taking: Reported on 02/25/2018) 10 capsule 0  . ferrous sulfate 325 (65 FE) MG EC tablet Take 1 tablet (325 mg total) by mouth 3 (three) times daily with meals. (Patient not taking: Reported on 02/25/2018) 90 tablet 0  . glucose blood (RELION GLUCOSE TEST STRIPS) test strip Use as instructed 100 each 12  . hydrALAZINE (APRESOLINE) 10  MG tablet Take 1 tablet (10 mg total) by mouth 3 (three) times daily. 90 tablet 5  . insulin glargine (LANTUS) 100 UNIT/ML injection Inject 0.2 mLs (20 Units total) into the skin daily. 10 mL 5  . INSULIN SYRINGE 1CC/29G (B-D INSULIN SYRINGE) 29G X 1/2" 1 ML MISC 1 application by Does not apply route at bedtime. 30 each 5  . losartan (COZAAR) 50 MG tablet Take 1 tablet (50 mg total) by mouth daily. 30 tablet 5  . metFORMIN (GLUCOPHAGE XR) 500 MG 24 hr tablet Take 1 tablet (500 mg total) by mouth 2 (two) times daily. 180 tablet 3   No facility-administered medications prior to visit.      ROS Review of Systems  Constitutional: Negative for chills, fever and malaise/fatigue.  Eyes: Positive for blurred vision.  Respiratory: Negative for shortness of breath.   Cardiovascular: Negative for chest pain and palpitations.  Gastrointestinal: Negative for abdominal pain and nausea.  Genitourinary: Negative for dysuria and hematuria.  Musculoskeletal: Negative for joint pain and myalgias.  Skin: Negative for rash.  Neurological: Negative for tingling and headaches.  Psychiatric/Behavioral: Negative for depression. The patient is not nervous/anxious.     Objective:  BP (!) 162/97   Pulse 81   Temp 97.6 F (36.4 C) (Oral)   Ht '5\' 2"'$  (1.575 m)   Wt 141 lb 3.2 oz (64 kg)   SpO2 98%   BMI 25.83 kg/m  BP/Weight 07/22/2018 02/25/2018 1/61/0960  Systolic BP 454 098 119  Diastolic BP 97 93 95  Wt. (Lbs) 141.2 148.6 145  BMI 25.83 25.51 26.52      Physical Exam  Constitutional: She is oriented to person, place, and time.  Well developed, well nourished, NAD, polite  HENT:  Head: Normocephalic and atraumatic.  No oral thrush  Eyes: No scleral icterus.  Neck: Normal range of motion. Neck supple. No thyromegaly present.  Cardiovascular: Normal rate, regular rhythm and normal heart sounds.  Pulmonary/Chest: Effort normal and breath sounds normal.  Abdominal: Soft. Bowel sounds are normal.  There is no tenderness.  Musculoskeletal: She exhibits no edema.  Neurological: She is alert and oriented to person, place, and time.  Skin: Skin is warm and dry. No rash noted. No erythema. No pallor.  Psychiatric: She has a normal mood and affect. Her behavior is normal. Thought content normal.  Vitals reviewed.    Assessment & Plan:    1. Type 2 diabetes mellitus with other circulatory complication, with long-term current use of insulin (HCC) - POCT glucose (manual entry) - POCT glycosylated hemoglobin (Hb J4N) - Basic Metabolic Panel - Lipid panel  2. Need for Tdap vaccination - Tdap vaccine greater than or equal to 7yo IM  3. Need for prophylactic vaccination and inoculation against influenza - Flu Vaccine QUAD 6+ mos PF IM (Fluarix Quad PF)  4. Screening for breast cancer - I messaged Tammy Bauer to have pt enrolled in [facility] program for free mammogram.  5. Special screening for malignant neoplasms, colon - Fecal occult blood, imunochemical   Meds ordered this encounter  Medications  . insulin glargine (LANTUS) 100 UNIT/ML injection    Sig: Inject 0.4 mLs (40 Units total) into the skin daily.    Dispense:  10 mL    Refill:  5    E11.10    Order Specific Question:   Supervising Provider    Answer:   Charlott Rakes [[id number]]  . amLODipine (NORVASC) 10 MG tablet    Sig: Take 1 tablet (10 mg total) by mouth daily. TAKE ONE TABLET BY MOUTH ONCE DAILY    Dispense:  30 tablet    Refill:  5    Order Specific Question:   Supervising Provider    Answer:   Charlott Rakes [[id number]]  . losartan (COZAAR) 100 MG tablet    Sig: Take 1 tablet (100 mg total) by mouth daily.    Dispense:  30 tablet    Refill:  5    Order Specific Question:   Supervising Provider    Answer:   Charlott Rakes [[id number]]  . hydrALAZINE (APRESOLINE) 10 MG tablet    Sig: Take 1 tablet (10 mg total) by mouth 3 (three) times daily.    Dispense:  90 tablet    Refill:  5    Order Specific Question:    Supervising Provider    Answer:   Charlott Rakes [[id number]]  . carvedilol (COREG) 6.25 MG tablet    Sig: Take 1 tablet (6.25 mg total) by mouth 2 (two) times daily with a meal.    Dispense:  60 tablet    Refill:  5    Order Specific Question:   Supervising Provider    Answer:   Charlott Rakes [[id number]]  . isosorbide mononitrate (IMDUR) 30 MG 24 hr tablet    Sig: Take 1 tablet (30 mg total) by mouth daily.    Dispense:  30 tablet    Refill:  5    Order Specific Question:   Supervising Provider    Answer:   Charlott Rakes [[id number]]    Follow-up: Return in about 4 weeks (around 08/19/2018) for blood sugar log review.   Clent Demark PA

## 2018-07-22 NOTE — Patient Instructions (Signed)
Diabetes Mellitus and Nutrition When you have diabetes (diabetes mellitus), it is very important to have healthy eating habits because your blood sugar (glucose) levels are greatly affected by what you eat and drink. Eating healthy foods in the appropriate amounts, at about the same times every day, can help you:  Control your blood glucose.  Lower your risk of heart disease.  Improve your blood pressure.  Reach or maintain a healthy weight.  Every person with diabetes is different, and each person has different needs for a meal plan. Your health care provider may recommend that you work with a diet and nutrition specialist (dietitian) to make a meal plan that is best for you. Your meal plan may vary depending on factors such as:  The calories you need.  The medicines you take.  Your weight.  Your blood glucose, blood pressure, and cholesterol levels.  Your activity level.  Other health conditions you have, such as heart or kidney disease.  How do carbohydrates affect me? Carbohydrates affect your blood glucose level more than any other type of food. Eating carbohydrates naturally increases the amount of glucose in your blood. Carbohydrate counting is a method for keeping track of how many carbohydrates you eat. Counting carbohydrates is important to keep your blood glucose at a healthy level, especially if you use insulin or take certain oral diabetes medicines. It is important to know how many carbohydrates you can safely have in each meal. This is different for every person. Your dietitian can help you calculate how many carbohydrates you should have at each meal and for snack. Foods that contain carbohydrates include:  Bread, cereal, rice, pasta, and crackers.  Potatoes and corn.  Peas, beans, and lentils.  Milk and yogurt.  Fruit and juice.  Desserts, such as cakes, cookies, ice cream, and candy.  How does alcohol affect me? Alcohol can cause a sudden decrease in blood  glucose (hypoglycemia), especially if you use insulin or take certain oral diabetes medicines. Hypoglycemia can be a life-threatening condition. Symptoms of hypoglycemia (sleepiness, dizziness, and confusion) are similar to symptoms of having too much alcohol. If your health care provider says that alcohol is safe for you, follow these guidelines:  Limit alcohol intake to no more than 1 drink per day for nonpregnant women and 2 drinks per day for men. One drink equals 12 oz of beer, 5 oz of wine, or 1 oz of hard liquor.  Do not drink on an empty stomach.  Keep yourself hydrated with water, diet soda, or unsweetened iced tea.  Keep in mind that regular soda, juice, and other mixers may contain a lot of sugar and must be counted as carbohydrates.  What are tips for following this plan? Reading food labels  Start by checking the serving size on the label. The amount of calories, carbohydrates, fats, and other nutrients listed on the label are based on one serving of the food. Many foods contain more than one serving per package.  Check the total grams (g) of carbohydrates in one serving. You can calculate the number of servings of carbohydrates in one serving by dividing the total carbohydrates by 15. For example, if a food has 30 g of total carbohydrates, it would be equal to 2 servings of carbohydrates.  Check the number of grams (g) of saturated and trans fats in one serving. Choose foods that have low or no amount of these fats.  Check the number of milligrams (mg) of sodium in one serving. Most people   should limit total sodium intake to less than 2,300 mg per day.  Always check the nutrition information of foods labeled as "low-fat" or "nonfat". These foods may be higher in added sugar or refined carbohydrates and should be avoided.  Talk to your dietitian to identify your daily goals for nutrients listed on the label. Shopping  Avoid buying canned, premade, or processed foods. These  foods tend to be high in fat, sodium, and added sugar.  Shop around the outside edge of the grocery store. This includes fresh fruits and vegetables, bulk grains, fresh meats, and fresh dairy. Cooking  Use low-heat cooking methods, such as baking, instead of high-heat cooking methods like deep frying.  Cook using healthy oils, such as olive, canola, or sunflower oil.  Avoid cooking with butter, cream, or high-fat meats. Meal planning  Eat meals and snacks regularly, preferably at the same times every day. Avoid going long periods of time without eating.  Eat foods high in fiber, such as fresh fruits, vegetables, beans, and whole grains. Talk to your dietitian about how many servings of carbohydrates you can eat at each meal.  Eat 4-6 ounces of lean protein each day, such as lean meat, chicken, fish, eggs, or tofu. 1 ounce is equal to 1 ounce of meat, chicken, or fish, 1 egg, or 1/4 cup of tofu.  Eat some foods each day that contain healthy fats, such as avocado, nuts, seeds, and fish. Lifestyle   Check your blood glucose regularly.  Exercise at least 30 minutes 5 or more days each week, or as told by your health care provider.  Take medicines as told by your health care provider.  Do not use any products that contain nicotine or tobacco, such as cigarettes and e-cigarettes. If you need help quitting, ask your health care provider.  Work with a counselor or diabetes educator to identify strategies to manage stress and any emotional and social challenges. What are some questions to ask my health care provider?  Do I need to meet with a diabetes educator?  Do I need to meet with a dietitian?  What number can I call if I have questions?  When are the best times to check my blood glucose? Where to find more information:  American Diabetes Association: diabetes.org/food-and-fitness/food  Academy of Nutrition and Dietetics:  www.eatright.org/resources/health/diseases-and-conditions/diabetes  National Institute of Diabetes and Digestive and Kidney Diseases (NIH): www.niddk.nih.gov/health-information/diabetes/overview/diet-eating-physical-activity Summary  A healthy meal plan will help you control your blood glucose and maintain a healthy lifestyle.  Working with a diet and nutrition specialist (dietitian) can help you make a meal plan that is best for you.  Keep in mind that carbohydrates and alcohol have immediate effects on your blood glucose levels. It is important to count carbohydrates and to use alcohol carefully. This information is not intended to replace advice given to you by your health care provider. Make sure you discuss any questions you have with your health care provider. Document Released: 08/17/2005 Document Revised: 12/25/2016 Document Reviewed: 12/25/2016 Elsevier Interactive Patient Education  2018 [employer]  

## 2018-07-23 ENCOUNTER — Telehealth (INDEPENDENT_AMBULATORY_CARE_PROVIDER_SITE_OTHER): Payer: Self-pay

## 2018-07-23 ENCOUNTER — Other Ambulatory Visit (INDEPENDENT_AMBULATORY_CARE_PROVIDER_SITE_OTHER): Payer: Self-pay | Admitting: Physician Assistant

## 2018-07-23 LAB — LIPID PANEL
Chol/HDL Ratio: 6.3 ratio — ABNORMAL HIGH (ref 0.0–4.4)
Cholesterol, Total: 226 mg/dL — ABNORMAL HIGH (ref 100–199)
HDL: 36 mg/dL — ABNORMAL LOW (ref >39)
LDL Calculated: 155 mg/dL — ABNORMAL HIGH (ref 0–99)
Triglycerides: 176 mg/dL — ABNORMAL HIGH (ref 0–149)
VLDL CHOLESTEROL CAL: 35 mg/dL (ref 5–40)

## 2018-07-23 LAB — BASIC METABOLIC PANEL
BUN / CREAT RATIO: 17 (ref 9–23)
BUN: 11 mg/dL (ref 6–24)
CHLORIDE: 105 mmol/L (ref 96–106)
CO2: 21 mmol/L (ref 20–29)
Calcium: 10.4 mg/dL — ABNORMAL HIGH (ref 8.7–10.2)
Creatinine, Ser: 0.64 mg/dL (ref 0.57–1.00)
GFR calc non Af Amer: 104 mL/min/{1.73_m2} (ref >59)
GFR, EST AFRICAN AMERICAN: 120 mL/min/{1.73_m2} (ref >59)
GLUCOSE: 146 mg/dL — AB (ref 65–99)
POTASSIUM: 4.1 mmol/L (ref 3.5–5.2)
SODIUM: 140 mmol/L (ref 134–144)

## 2018-07-23 NOTE — Telephone Encounter (Signed)
Patient is aware that cholesterol is elevated and to take atorvastatin as directed. Tammy Bauer, CMA

## 2018-07-23 NOTE — Telephone Encounter (Signed)
-----   Message from Loletta Specteroger David Gomez, PA-C sent at 07/23/2018  8:37 AM EDT ----- Cholesterol elevated. Please tell her to take her atorvastatin as directed.

## 2018-08-10 ENCOUNTER — Other Ambulatory Visit: Payer: Self-pay

## 2018-08-10 ENCOUNTER — Encounter (HOSPITAL_COMMUNITY): Payer: Self-pay | Admitting: Emergency Medicine

## 2018-08-10 ENCOUNTER — Emergency Department (HOSPITAL_COMMUNITY)
Admission: EM | Admit: 2018-08-10 | Discharge: 2018-08-10 | Disposition: A | Discharge status: Home / Self Care | Payer: Self-pay | Attending: Emergency Medicine | Admitting: Emergency Medicine

## 2018-08-10 DIAGNOSIS — F17210 Nicotine dependence, cigarettes, uncomplicated: Secondary | ICD-10-CM | POA: Insufficient documentation

## 2018-08-10 DIAGNOSIS — Z7902 Long term (current) use of antithrombotics/antiplatelets: Secondary | ICD-10-CM | POA: Insufficient documentation

## 2018-08-10 DIAGNOSIS — Z794 Long term (current) use of insulin: Secondary | ICD-10-CM | POA: Insufficient documentation

## 2018-08-10 DIAGNOSIS — E119 Type 2 diabetes mellitus without complications: Secondary | ICD-10-CM | POA: Insufficient documentation

## 2018-08-10 DIAGNOSIS — Z8673 Personal history of transient ischemic attack (TIA), and cerebral infarction without residual deficits: Secondary | ICD-10-CM | POA: Insufficient documentation

## 2018-08-10 DIAGNOSIS — H6002 Abscess of left external ear: Secondary | ICD-10-CM | POA: Insufficient documentation

## 2018-08-10 DIAGNOSIS — I10 Essential (primary) hypertension: Secondary | ICD-10-CM | POA: Insufficient documentation

## 2018-08-10 DIAGNOSIS — Z79899 Other long term (current) drug therapy: Secondary | ICD-10-CM | POA: Insufficient documentation

## 2018-08-10 MED ORDER — LIDOCAINE HCL (PF) 2 % IJ SOLN
10.0000 mL | Freq: Once | INTRAMUSCULAR | Status: AC
  Administered 2018-08-10: 10 mL

## 2018-08-10 MED ORDER — LIDOCAINE HCL (PF) 2 % IJ SOLN
INTRAMUSCULAR | Status: AC
  Administered 2018-08-10: 10 mL
  Filled 2018-08-10: qty 10

## 2018-08-10 MED ORDER — SULFAMETHOXAZOLE-TRIMETHOPRIM 800-160 MG PO TABS
1.0000 | ORAL_TABLET | Freq: Once | ORAL | Status: AC
  Administered 2018-08-10: 1 via ORAL
  Filled 2018-08-10: qty 1

## 2018-08-10 MED ORDER — SULFAMETHOXAZOLE-TRIMETHOPRIM 800-160 MG PO TABS
1.0000 | ORAL_TABLET | Freq: Two times a day (BID) | ORAL | 0 refills | Status: DC
Start: 2018-08-10 — End: 2018-08-10

## 2018-08-10 MED ORDER — SULFAMETHOXAZOLE-TRIMETHOPRIM 800-160 MG PO TABS: 1.0000 | ORAL_TABLET | Freq: Two times a day (BID) | ORAL | 0 refills | Status: AC

## 2018-08-10 NOTE — ED Triage Notes (Signed)
Pt with swollen earlobe since yesterday and now swelling has moved behind ear.

## 2018-08-10 NOTE — Discharge Instructions (Addendum)
Start applying warm water soaks twice daily to keep this site open and draining. Take the entire course of the antibiotics prescribed.

## 2018-08-10 NOTE — ED Notes (Signed)
Dressing applied to left earlobe, pt tolerated well,

## 2018-08-11 NOTE — ED Provider Notes (Signed)
[facility] EMERGENCY DEPARTMENT Provider Note   CSN: [id number] Arrival date & time: 08/10/18  2057     History   Chief Complaint Chief Complaint  Patient presents with  . Swollen Earlobe    HPI Tammy Bauer is a 52 y.o. female with a history of well controlled DM and rare skin infections but does endorse right earlobe infection requiring I&D presenting with a left earlobe suspected abscess. She endorses moderate pain and swelling since yesterday and now has a tender knot in he left upper neck.  She denies fevers, chills, n/v, headache and there has been no drainage from the site.  She has applied warm soaks without relief.  The history is provided by the patient.    Past Medical History:  Diagnosis Date  . Anemia   . Diabetes mellitus without complication ([place])   . Hypertension   . Stroke [facility])    x2; no deficits    Patient Active Problem List   Diagnosis Date Noted  . Vaginal bleeding 01/03/2018  . Acute blood loss anemia 01/03/2018  . Anemia 01/03/2018  . Acute ischemic stroke ([place]) 10/02/2017  . DM2 (diabetes mellitus, type 2) ([place]) 10/02/2017  . Acute CVA (cerebrovascular accident) ([place]) 06/25/2017  . Hypertensive emergency 06/25/2017  . Tobacco abuse 06/25/2017  . Essential hypertension 10/07/2015  . Hyperglycemia 10/07/2015    Past Surgical History:  Procedure Laterality Date  . ENDOMETRIAL ABLATION N/A 01/30/2018   Procedure: ENDOMETRIAL ABLATION WITH MINERVA;  Surgeon: Florian Buff, MD;  Location: [facility];  Service: Gynecology;  Laterality: N/A;  . HYSTEROSCOPY W/D&C N/A 01/30/2018   Procedure: DILATATION AND CURETTAGE /HYSTEROSCOPY;  Surgeon: Florian Buff, MD;  Location: [facility] ORS;  Service: Gynecology;  Laterality: N/A;  . LOOP RECORDER INSERTION N/A 06/27/2017   Procedure: Loop Recorder Insertion;  Surgeon: Constance Haw, MD;  Location: [facility] CV LAB;  Service: Cardiovascular;  Laterality: N/A;  . TEE WITHOUT CARDIOVERSION N/A 06/27/2017   Procedure: TRANSESOPHAGEAL ECHOCARDIOGRAM (TEE);  Surgeon: Pixie Casino, MD;  Location: [facility] ENDOSCOPY;  Service: Cardiovascular;  Laterality: N/A;     OB History   None      Home Medications    Prior to Admission medications   Medication Sig Start Date End Date Taking? Authorizing Provider  acetaminophen (TYLENOL) 325 MG tablet Take 650 mg by mouth every 6 (six) hours as needed for mild pain or moderate pain.     [provider]  amLODipine (NORVASC) 10 MG tablet Take 1 tablet (10 mg total) by mouth daily. TAKE ONE TABLET BY MOUTH ONCE DAILY 07/22/18   Clent Demark, PA-C  atorvastatin (LIPITOR) 40 MG tablet Take 1 tablet (40 mg total) by mouth at bedtime. Any generic statin from [facility] list is okay 07/18/17   Clent Demark, PA-C  blood glucose meter kit and supplies KIT Dispense based on patient and insurance preference. Use up to four times daily as directed. (FOR ICD-9 250.00, 250.01). 07/18/17   Clent Demark, PA-C  carvedilol (COREG) 6.25 MG tablet Take 1 tablet (6.25 mg total) by mouth 2 (two) times daily with a meal. 07/22/18   Clent Demark, PA-C  clopidogrel (PLAVIX) 75 MG tablet Take 1 tablet (75 mg total) by mouth daily. 11/05/17   Clent Demark, PA-C  glucose blood (RELION GLUCOSE TEST STRIPS) test strip Use as instructed 07/18/17   Clent Demark, PA-C  hydrALAZINE (APRESOLINE) 10 MG tablet Take 1 tablet (10 mg total) by mouth  3 (three) times daily. 07/22/18   Clent Demark, PA-C  insulin glargine (LANTUS) 100 UNIT/ML injection Inject 0.4 mLs (40 Units total) into the skin daily. 07/22/18   Clent Demark, PA-C  INSULIN SYRINGE 1CC/29G (B-D INSULIN SYRINGE) 29G X 1/2" 1 ML MISC 1 application by Does not apply route at bedtime. 01/22/18   Clent Demark, PA-C  isosorbide mononitrate (IMDUR) 30 MG 24 hr tablet Take 1 tablet (30 mg total) by mouth daily. 07/22/18   Clent Demark, PA-C  losartan (COZAAR) 100 MG tablet Take 1 tablet  (100 mg total) by mouth daily. 07/22/18   Clent Demark, PA-C  sulfamethoxazole-trimethoprim (BACTRIM DS,SEPTRA DS) 800-160 MG tablet Take 1 tablet by mouth 2 (two) times daily for 10 days. 08/10/18 08/20/18  Evalee Jefferson, PA-C    Family History Family History  Problem Relation Age of Onset  . Stroke Maternal Grandfather     Social History Social History   Tobacco Use  . Smoking status: Current Every Day Smoker    Packs/day: 0.25    Years: 20.00    Pack years: 5.00    Types: Cigarettes    Start date: 08/29/1999  . Smokeless tobacco: Never Used  . Tobacco comment: smoke 5 cigarettes a day  Substance Use Topics  . Alcohol use: No  . Drug use: No     Allergies   Ace inhibitors   Review of Systems Review of Systems  Constitutional: Negative for chills and fever.  HENT: Positive for ear pain. Negative for facial swelling.   Respiratory: Negative for shortness of breath.   Gastrointestinal: Positive for nausea and vomiting.  Skin:       Negative except as mentioned in HPI.   Neurological: Negative for numbness.     Physical Exam Updated Vital Signs BP (!) 164/98 (BP Location: Right Arm)   Pulse 85   Temp 98.2 F (36.8 C) (Oral)   Resp 18   Ht '5\' 2"'$  (1.575 m)   Wt 65.8 kg   SpO2 96%   BMI 26.52 kg/m   Physical Exam  Constitutional: She appears well-developed and well-nourished. No distress.  HENT:  Head: Normocephalic.  Neck: Neck supple.  Cardiovascular: Normal rate.  Pulmonary/Chest: Effort normal. She has no wheezes.  Musculoskeletal: Normal range of motion. She exhibits no edema.  Lymphadenopathy:       Head (left side): Posterior auricular adenopathy present.    She has no cervical adenopathy.  Skin: Skin is warm.  Small fluctuant abscess left posterior earlobe without drainage. Moderate soft edema without pointing, no red streaking.       ED Treatments / Results  Labs (all labs ordered are listed, but only abnormal results are displayed) Labs  Reviewed - No data to display  EKG None  Radiology No results found.  Procedures Procedures (including critical care time)  INCISION AND DRAINAGE Performed by: Evalee Jefferson Consent: Verbal consent obtained. Risks and benefits: risks, benefits and alternatives were discussed Type: abscess  Body area: left earlobe  Anesthesia: local infiltration  Incision was made with a scalpel.  Local anesthetic: lidocaine 2% without epinephrine  Anesthetic total: 1 ml  Complexity: complex Blunt dissection to break up loculations  Drainage: purulent  Drainage amount: small  Packing material: none  Patient tolerance: Patient tolerated the procedure well with no immediate complications.    Medications Ordered in ED Medications  lidocaine (XYLOCAINE) 2 % injection 10 mL (10 mLs Other Given by Other 08/10/18 2245)  sulfamethoxazole-trimethoprim (BACTRIM  DS,SEPTRA DS) 800-160 MG per tablet 1 tablet (1 tablet Oral Given 08/10/18 2323)     Initial Impression / Assessment and Plan / ED Course  I have reviewed the triage vital signs and the nursing notes.  Pertinent labs & imaging results that were available during my care of the patient were reviewed by me and considered in my medical decision making (see chart for details).     Home instructions given including continued warm soaks to keep site draining, bactrim, first dose given here. Advised f/u with pcp and/or recheck here for any persistent or worsened sx.   Final Clinical Impressions(s) / ED Diagnoses   Final diagnoses:  Abscess of left earlobe    ED Discharge Orders         Ordered    sulfamethoxazole-trimethoprim (BACTRIM DS,SEPTRA DS) 800-160 MG tablet  2 times daily,   Status:  Discontinued     08/10/18 2315    sulfamethoxazole-trimethoprim (BACTRIM DS,SEPTRA DS) 800-160 MG tablet  2 times daily     08/10/18 2328           Evalee Jefferson, PA-C 08/11/18 1312    Davonna Belling, MD 08/11/18 2315

## 2018-08-12 ENCOUNTER — Telehealth (HOSPITAL_COMMUNITY): Payer: Self-pay | Admitting: *Deleted

## 2018-08-12 NOTE — Telephone Encounter (Signed)
Telephoned patient at home number and left message to return call to [facility] to scheduled mammogram.

## 2018-08-13 ENCOUNTER — Ambulatory Visit (INDEPENDENT_AMBULATORY_CARE_PROVIDER_SITE_OTHER): Payer: Self-pay | Admitting: *Deleted

## 2018-08-13 DIAGNOSIS — I639 Cerebral infarction, unspecified: Secondary | ICD-10-CM

## 2018-08-13 NOTE — Progress Notes (Signed)
[facility] Summary Report / Loop Recorder 

## 2018-08-16 ENCOUNTER — Telehealth (HOSPITAL_COMMUNITY): Payer: Self-pay

## 2018-08-16 NOTE — Telephone Encounter (Signed)
TRIED TO CALL I DID LEAVE A MESSAGE °

## 2018-08-20 LAB — CUP PACEART REMOTE DEVICE CHECK
Implantable Pulse Generator Implant Date: 20180725
MDC IDC SESS DTM: 20190808034034

## 2018-08-26 ENCOUNTER — Ambulatory Visit (INDEPENDENT_AMBULATORY_CARE_PROVIDER_SITE_OTHER): Payer: Self-pay | Admitting: Physician Assistant

## 2018-08-28 ENCOUNTER — Telehealth (HOSPITAL_COMMUNITY): Payer: Self-pay | Admitting: *Deleted

## 2018-08-28 NOTE — Telephone Encounter (Signed)
Telephoned patient at home number and left message to return call to [facility] 

## 2018-08-29 MED FILL — LOSARTAN POTASSIUM 50 MG TA: 50 | 30 days supply | Qty: 30 | Fill #2

## 2018-08-29 MED FILL — hydrALAZINE HCL 10 MG TABS: 10 | 30 days supply | Qty: 90 | Fill #4

## 2018-08-29 MED FILL — CLOPIDOGREL 75 MG TABLET: 75 | 30 days supply | Qty: 30 | Fill #8

## 2018-08-29 MED FILL — !LANTUS 100 UNITS/ML VIAL: 100 | 25 days supply | Qty: 10 | Fill #0

## 2018-08-29 MED FILL — AMLODIPINE BESYLATE 10 MG T: 10 | 30 days supply | Qty: 30 | Fill #3

## 2018-08-30 LAB — CUP PACEART REMOTE DEVICE CHECK
MDC IDC PG IMPLANT DT: 20180725
MDC IDC SESS DTM: 20190910084059

## 2018-09-06 ENCOUNTER — Telehealth (HOSPITAL_COMMUNITY): Payer: Self-pay

## 2018-09-06 NOTE — Telephone Encounter (Signed)
Called left a message to call [facility] °

## 2018-09-16 ENCOUNTER — Ambulatory Visit (INDEPENDENT_AMBULATORY_CARE_PROVIDER_SITE_OTHER): Payer: Self-pay | Admitting: *Deleted

## 2018-09-16 DIAGNOSIS — I639 Cerebral infarction, unspecified: Secondary | ICD-10-CM

## 2018-09-17 NOTE — Progress Notes (Signed)
[facility] Summary Report / Loop Recorder 

## 2018-09-30 LAB — CUP PACEART REMOTE DEVICE CHECK
Implantable Pulse Generator Implant Date: 20180725
MDC IDC SESS DTM: 20191013093906

## 2018-10-09 MED FILL — LOSARTAN POTASSIUM 50 MG TA: 50 | 30 days supply | Qty: 30 | Fill #3

## 2018-10-09 MED FILL — hydrALAZINE HCL 10 MG TABS: 10 | 30 days supply | Qty: 90 | Fill #5

## 2018-10-09 MED FILL — CLOPIDOGREL 75 MG TABLET: 75 | 30 days supply | Qty: 30 | Fill #9

## 2018-10-09 MED FILL — AMLODIPINE BESYLATE 10 MG T: 10 | 30 days supply | Qty: 30 | Fill #4

## 2018-10-18 ENCOUNTER — Ambulatory Visit (INDEPENDENT_AMBULATORY_CARE_PROVIDER_SITE_OTHER): Payer: Self-pay | Admitting: *Deleted

## 2018-10-18 DIAGNOSIS — I639 Cerebral infarction, unspecified: Secondary | ICD-10-CM

## 2018-10-18 NOTE — Progress Notes (Signed)
[facility] Summary Report / Loop Recorder 

## 2018-11-14 ENCOUNTER — Other Ambulatory Visit (INDEPENDENT_AMBULATORY_CARE_PROVIDER_SITE_OTHER): Payer: Self-pay | Admitting: Physician Assistant

## 2018-11-14 MED FILL — LOSARTAN POTASSIUM 50 MG TA: 50 | 30 days supply | Qty: 30 | Fill #4

## 2018-11-14 MED FILL — $LANTUS 100 UNITS/ML VIAL: 100 | 100 days supply | Qty: 40 | Fill #1

## 2018-11-14 MED FILL — AMLODIPINE BESYLATE 10 MG T: 10 | 30 days supply | Qty: 30 | Fill #5

## 2018-11-14 MED FILL — hydrALAZINE HCL 10 MG TABS: 10 | 30 days supply | Qty: 90 | Fill #0

## 2018-11-14 NOTE — Telephone Encounter (Signed)
Tell pt to call neurologist and see if she should continue/refill her plavix. Thank you.

## 2018-11-14 NOTE — Telephone Encounter (Signed)
FWD to PCP. Taylinn Brabant S Katheren Jimmerson, CMA  

## 2018-11-15 NOTE — Telephone Encounter (Signed)
Left voicemail advising patient to contact neurologist to see if she should continue/refill plavix. Maryjean Mornempestt S Roberts, CMA

## 2018-11-18 ENCOUNTER — Telehealth: Payer: Self-pay | Admitting: Neurology

## 2018-11-18 NOTE — Progress Notes (Deleted)
[facility] [address] [place]. [place] [address] [phone]       OFFICE FOLLOW-UP NOTE  [name] Date of Birth:  February 28, 1966 Medical Record Number:  [id number]   HPI: [name] is a pleasant 52 year african american lady 52 year office f/u visit following [facility] admission for recent stroke. History is obtained from patient and personal review of electronic medical records.Tammy Sheltonis a 52 y.o.femalewith a history of hypertension, medication non-compliance, and tobacco abuse who presented to the emergency room at [facility] with complaints of double vision that started about a week ago but can't really worse on Sunday. Next line should reported seeing double from both eyes, images side-to-side with no improvement if she covered one eye.  Her blood pressure on arrival systolic was 782U. She was admitted for evaluation of possible posterior reversible encephalopathy syndrome versus stroke.MRI imaging was obtained which I personally reviewed revealed evidence of Acute/subacute nonhemorrhagic infarct involving the superior left temporal lobe, posterior insular cortex, and left frontal operculum  She was transferred to [facility] because of unavailability of telemetry beds at [facility].  Denies any current headaches. Reports occasional headaches. Next line denies any nausea or vomiting. Next line to managing fevers chills next line denies any weight gain or weight loss.  Patient does not report any easy bruisability or bleeding. Denies any clotting disorders. Denies any history of miscarriages.  Pt is a current smoker and newly-diagnosed diabetic with an a1C of 11.1.LKW: At least 7 days ago.Premorbid modified Rankin scale (mRS): 0.NIHSS: 0-Completely asymptomatic and back to baseline post-stroke.Transthoraxic  Echo showed normal ejection.TEE was normal. Loop recorder was placed. fraction and carotid dopplers showed no significant extracranial  stenosis.LDL cholestrol was elevated at 112 mg% and HbA1c at 11.1.She was advised to stop smoking and started on aspirin and lipitor. She states she is doing well her blood pressure is well controlled and and today it is 137/89. She continues to pull smoker but has cutback to half pack per day. She is tolerating aspirin without bruising or bleeding and Lipitor without any muscle aches. Her fasting sugars are doing well and range below 100. Loop recorder  has so far not detected any paroxysmal atrial fibrillation.she has no complaints today. She is back to her baseline doing everything she did prior to her stroke.  Interval history 11/19/2018: Patient is being seen today for follow-up visit.  It appears as though patient was lost to follow-up and requesting refill of her Plavix but as she has not been seen in this office since 07/30/2017, it was recommended for her to come in to the office for evaluation.  Patient does have loop recorder and has not shown atrial fibrillation thus far.    ROS:   14 system review of systems is positive for  double vision only and all other systems negative  PMH:  Past Medical History:  Diagnosis Date  . Anemia   . Diabetes mellitus without complication ([place])   . Hypertension   . Stroke [facility])    x2; no deficits    Social History:  Social History   Socioeconomic History  . Marital status: Married    Spouse name: Not on file  . Number of children: Not on file  . Years of education: Not on file  . Highest education level: Not on file  Occupational History  . Not on file  Social Needs  . Financial resource strain: Not on file  . Food  insecurity:    Worry: Not on file    Inability: Not on file  . Transportation needs:    Medical: Not on file    Non-medical: Not on file  Tobacco Use  . Smoking status: Current Every Day Smoker    Packs/day: 0.25    Years: 20.00    Pack years: 5.00    Types: Cigarettes    Start date: 08/29/1999  . Smokeless tobacco:  Never Used  . Tobacco comment: smoke 5 cigarettes a day  Substance and Sexual Activity  . Alcohol use: No  . Drug use: No  . Sexual activity: Not Currently    Birth control/protection: None  Lifestyle  . Physical activity:    Days per week: Not on file    Minutes per session: Not on file  . Stress: Not on file  Relationships  . Social connections:    Talks on phone: Not on file    Gets together: Not on file    Attends religious service: Not on file    Active member of club or organization: Not on file    Attends meetings of clubs or organizations: Not on file    Relationship status: Not on file  . Intimate partner violence:    Fear of current or ex partner: Not on file    Emotionally abused: Not on file    Physically abused: Not on file    Forced sexual activity: Not on file  Other Topics Concern  . Not on file  Social History Narrative  . Not on file    Medications:   Current Outpatient Medications on File Prior to Visit  Medication Sig Dispense Refill  . acetaminophen (TYLENOL) 325 MG tablet Take 650 mg by mouth every 6 (six) hours as needed for mild pain or moderate pain.     Marland Kitchen amLODipine (NORVASC) 10 MG tablet Take 1 tablet (10 mg total) by mouth daily. TAKE ONE TABLET BY MOUTH ONCE DAILY 30 tablet 5  . atorvastatin (LIPITOR) 40 MG tablet Take 1 tablet (40 mg total) by mouth at bedtime. Any generic statin from [facility] list is okay 30 tablet 5  . blood glucose meter kit and supplies KIT Dispense based on patient and insurance preference. Use up to four times daily as directed. (FOR ICD-9 250.00, 250.01). 1 each 0  . carvedilol (COREG) 6.25 MG tablet Take 1 tablet (6.25 mg total) by mouth 2 (two) times daily with a meal. 60 tablet 5  . clopidogrel (PLAVIX) 75 MG tablet Take 1 tablet (75 mg total) by mouth daily. 90 tablet 3  . glucose blood (RELION GLUCOSE TEST STRIPS) test strip Use as instructed 100 each 12  . hydrALAZINE (APRESOLINE) 10 MG tablet Take 1 tablet (10 mg  total) by mouth 3 (three) times daily. 90 tablet 5  . insulin glargine (LANTUS) 100 UNIT/ML injection Inject 0.4 mLs (40 Units total) into the skin daily. 10 mL 5  . INSULIN SYRINGE 1CC/29G (B-D INSULIN SYRINGE) 29G X 1/2" 1 ML MISC 1 application by Does not apply route at bedtime. 30 each 5  . isosorbide mononitrate (IMDUR) 30 MG 24 hr tablet Take 1 tablet (30 mg total) by mouth daily. 30 tablet 5  . losartan (COZAAR) 100 MG tablet Take 1 tablet (100 mg total) by mouth daily. 30 tablet 5   No current facility-administered medications on file prior to visit.     Allergies:   Allergies  Allergen Reactions  . Ace Inhibitors Cough  Physical Exam General: well developed, well nourished middle aged lady not in distress, seated, in no evident distress Head: head normocephalic and atraumatic.  Neck: supple with no carotid or supraclavicular bruits Cardiovascular: regular rate and rhythm, no murmurs Musculoskeletal: no deformity Skin:  no rash/petichiae Vascular:  Normal pulses all extremities There were no vitals filed for this visit. Neurologic Exam Mental Status: Awake and fully alert. Oriented to place and time. Recent and remote memory intact. Attention span, concentration and fund of knowledge appropriate. Mood and affect appropriate.  Cranial Nerves: Fundoscopic exam reveals sharp disc margins. Pupils equal, briskly reactive to light. Extraocular movements full without nystagmus. Visual fields full to confrontation. Hearing intact. Facial sensation intact. Face, tongue, palate moves normally and symmetrically.  Motor: Normal bulk and tone. Normal strength in all tested extremity muscles. Sensory.: intact to touch ,pinprick .position and vibratory sensation.  Coordination: Rapid alternating movements normal in all extremities. Finger-to-nose and heel-to-shin performed accurately bilaterally. Gait and Station: Arises from chair without difficulty. Stance is normal. Gait demonstrates  normal stride length and balance . Able to heel, toe and tandem walk without difficulty.  Reflexes: 1+ and symmetric. Toes downgoing.   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 I also advised the patient to eat a healthy diet with plenty of whole grains, cereals, fruits and vegetables, exercise regularly and maintain ideal body weight . I have counseled the patient to quit smoking completely and she agrees. The patient is interested in participating in particpating in [place] stroke patient registry.Followup in the future with my nurse practitioner in 6 months or call earlier if necessary  Greater than 50% of time during this 25 minute visit was spent on counseling,explanation of diagnosis, planning of further management, discussion with patient and family and coordination of care  Venancio Poisson, [facility]  [facility] [address] [place], [place] [address]  Phone [phone] Fax [phone] Note: This document was prepared with digital dictation and possible smart phrase technology. Any transcriptional errors that result from this process are unintentional.

## 2018-11-18 NOTE — Telephone Encounter (Signed)
PT last seen 07/2017 never follow up as recommend.Spoke with [name] pt has appt tomorrow and needs to be seen for refill of plavix if PCP is not longer her MD. NO refills will be given until pt sees JessiCA NP. The PCP had been managing it.

## 2018-11-18 NOTE — Telephone Encounter (Signed)
Pt called stating her PCP will no longer refill clopidogrel (PLAVIX) 75 MG tablet and she took her last tablet today [facility] Scheduled pt appt with NP for 11/19/18. Please advise.

## 2018-11-18 NOTE — Telephone Encounter (Signed)
Rn spoke with [name] at Dr. Lily KocherGOmez office. She stated his last day is in January 2020. They dont know who the pt new PCP will be. Rn explain pt was last seen 07/2017 and never follow up with as recommend. Rn stated [name] will be seeing her tomorrow for an appt and will evalluate her for plavix. They verbalized understanding..Tammy Bauer

## 2018-11-19 ENCOUNTER — Telehealth (INDEPENDENT_AMBULATORY_CARE_PROVIDER_SITE_OTHER): Payer: Self-pay

## 2018-11-19 ENCOUNTER — Telehealth: Payer: Self-pay

## 2018-11-19 ENCOUNTER — Other Ambulatory Visit (INDEPENDENT_AMBULATORY_CARE_PROVIDER_SITE_OTHER): Payer: Self-pay | Admitting: Physician Assistant

## 2018-11-19 ENCOUNTER — Ambulatory Visit: Payer: Self-pay | Admitting: Adult Health

## 2018-11-19 DIAGNOSIS — Z760 Encounter for issue of repeat prescription: Secondary | ICD-10-CM

## 2018-11-19 MED ORDER — CLOPIDOGREL BISULFATE 75 MG PO TABS: 75.0000 mg | ORAL_TABLET | Freq: Every day | ORAL | 3 refills | Status: DC

## 2018-11-19 MED FILL — CLOPIDOGREL 75 MG TABLET: 75 | 30 days supply | Qty: 30 | Fill #0

## 2018-11-19 NOTE — Telephone Encounter (Signed)
-----   Message from Loletta Specteroger David Gomez, PA-C sent at 11/19/2018  2:54 PM EST ----- Thank you for the message. I have sent a refill of Plavix to [facility].   Roger  #Tempestt would you please call patient and let her know to pick up plavix at [facility]? Thanks.    ----- Message ----- From: Hildred AlaminMurrell, Katrina Y, RN Sent: 11/19/2018   2:35 PM EST To: Loletta Specteroger David Gomez, PA-C  Hello  Roger pt was schedule today at 215pm for follow up appt. Pt was last seen 07/2017 by our office and never follow up. She came but did not have the copayment for the visit. Unfortunately she was not seen today by [name]. I understand your last day is in January 2020 at your office. I know the plavix was manage by you for the last year. We cannot refill the medication because she was last seen 07/2017. Can anyone at your office refill the medication for patient being that she was just seen a couple of months ago. IF you have any questions feel free to call us at [phone].

## 2018-11-19 NOTE — Telephone Encounter (Signed)
Patient is aware that Plavix has been sent to [facility]. Tammy Bauer, CMA

## 2018-11-19 NOTE — Telephone Encounter (Signed)
PT came but was not seen. PT did not have co payment for visit today.

## 2018-11-20 ENCOUNTER — Ambulatory Visit (INDEPENDENT_AMBULATORY_CARE_PROVIDER_SITE_OTHER): Payer: Self-pay

## 2018-11-20 ENCOUNTER — Encounter: Payer: Self-pay | Admitting: Adult Health

## 2018-11-20 DIAGNOSIS — I639 Cerebral infarction, unspecified: Secondary | ICD-10-CM

## 2018-11-20 NOTE — Progress Notes (Signed)
[facility] Summary Report / Loop Recorder 

## 2018-11-21 ENCOUNTER — Telehealth: Payer: Self-pay

## 2018-11-21 NOTE — Telephone Encounter (Signed)
Patient came to appt and check in. Pt did not have co payment and was unaware she had to pay per front desk.PT was not seen.

## 2018-11-28 ENCOUNTER — Telehealth (HOSPITAL_COMMUNITY): Payer: Self-pay | Admitting: *Deleted

## 2018-11-28 NOTE — Telephone Encounter (Signed)
Telephoned patient, left a message to return a call to [facility] regarding setting up an appointment.

## 2018-12-08 LAB — CUP PACEART REMOTE DEVICE CHECK
Date Time Interrogation Session: 20191115093520
MDC IDC PG IMPLANT DT: 20180725

## 2018-12-14 LAB — CUP PACEART REMOTE DEVICE CHECK
Date Time Interrogation Session: 20191218124015
MDC IDC PG IMPLANT DT: 20180725

## 2018-12-18 MED FILL — LOSARTAN POTASSIUM 50 MG TA: 50 | 30 days supply | Qty: 30 | Fill #5

## 2018-12-18 MED FILL — hydrALAZINE HCL 10 MG TABS: 10 | 30 days supply | Qty: 90 | Fill #1

## 2018-12-18 MED FILL — CLOPIDOGREL 75 MG TABLET: 75 | 30 days supply | Qty: 30 | Fill #1

## 2018-12-18 MED FILL — AMLODIPINE BESYLATE 10 MG T: 10 | 30 days supply | Qty: 30 | Fill #0

## 2018-12-23 ENCOUNTER — Ambulatory Visit (INDEPENDENT_AMBULATORY_CARE_PROVIDER_SITE_OTHER): Payer: Self-pay

## 2018-12-23 DIAGNOSIS — I639 Cerebral infarction, unspecified: Secondary | ICD-10-CM

## 2018-12-24 LAB — CUP PACEART REMOTE DEVICE CHECK
Date Time Interrogation Session: 20200120131042
MDC IDC PG IMPLANT DT: 20180725

## 2018-12-24 NOTE — Progress Notes (Signed)
[facility] Summary Report / Loop Recorder 

## 2019-01-13 ENCOUNTER — Telehealth: Payer: Self-pay | Admitting: *Deleted

## 2019-01-13 NOTE — Telephone Encounter (Signed)
Telephoned patient [facility] a [facility].

## 2019-01-27 ENCOUNTER — Ambulatory Visit (INDEPENDENT_AMBULATORY_CARE_PROVIDER_SITE_OTHER): Payer: Self-pay | Admitting: *Deleted

## 2019-01-27 DIAGNOSIS — I639 Cerebral infarction, unspecified: Secondary | ICD-10-CM

## 2019-01-28 LAB — CUP PACEART REMOTE DEVICE CHECK
Implantable Pulse Generator Implant Date: 20180725
MDC IDC SESS DTM: 20200222133905

## 2019-02-03 MED FILL — AMLODIPINE BESYLATE 10 MG T: 10 | 30 days supply | Qty: 30 | Fill #1

## 2019-02-03 MED FILL — hydrALAZINE HCL 10 MG TABS: 10 | 30 days supply | Qty: 90 | Fill #2

## 2019-02-03 MED FILL — CLOPIDOGREL 75 MG TABLET: 75 | 30 days supply | Qty: 30 | Fill #2

## 2019-02-04 NOTE — Progress Notes (Signed)
[facility] Summary Report / Loop Recorder 

## 2019-02-19 ENCOUNTER — Telehealth (INDEPENDENT_AMBULATORY_CARE_PROVIDER_SITE_OTHER): Payer: Self-pay | Admitting: Primary Care

## 2019-02-19 NOTE — Telephone Encounter (Signed)
Patient called to request med refill for   losartan (COZAAR) 100 MG tablet   Patient uses [facility]   Please Advise [phone]  Thank you Tammy Bauer

## 2019-02-20 ENCOUNTER — Emergency Department (HOSPITAL_COMMUNITY)
Admission: EM | Admit: 2019-02-20 | Discharge: 2019-02-20 | Disposition: A | Discharge status: Home / Self Care | Payer: Self-pay | Attending: Emergency Medicine | Admitting: Emergency Medicine

## 2019-02-20 ENCOUNTER — Emergency Department (HOSPITAL_COMMUNITY): Payer: Self-pay

## 2019-02-20 ENCOUNTER — Ambulatory Visit (INDEPENDENT_AMBULATORY_CARE_PROVIDER_SITE_OTHER): Payer: Self-pay | Admitting: Primary Care

## 2019-02-20 ENCOUNTER — Encounter (INDEPENDENT_AMBULATORY_CARE_PROVIDER_SITE_OTHER): Payer: Self-pay | Admitting: Primary Care

## 2019-02-20 ENCOUNTER — Other Ambulatory Visit: Payer: Self-pay

## 2019-02-20 VITALS — BP 147/96 | HR 89 | Temp 97.8°F | Ht 62.0 in | Wt 145.4 lb

## 2019-02-20 DIAGNOSIS — E119 Type 2 diabetes mellitus without complications: Secondary | ICD-10-CM | POA: Insufficient documentation

## 2019-02-20 DIAGNOSIS — F17210 Nicotine dependence, cigarettes, uncomplicated: Secondary | ICD-10-CM | POA: Insufficient documentation

## 2019-02-20 DIAGNOSIS — E1159 Type 2 diabetes mellitus with other circulatory complications: Secondary | ICD-10-CM

## 2019-02-20 DIAGNOSIS — Z794 Long term (current) use of insulin: Secondary | ICD-10-CM

## 2019-02-20 DIAGNOSIS — Z720 Tobacco use: Secondary | ICD-10-CM

## 2019-02-20 DIAGNOSIS — Y929 Unspecified place or not applicable: Secondary | ICD-10-CM | POA: Insufficient documentation

## 2019-02-20 DIAGNOSIS — Z760 Encounter for issue of repeat prescription: Secondary | ICD-10-CM

## 2019-02-20 DIAGNOSIS — I10 Essential (primary) hypertension: Secondary | ICD-10-CM

## 2019-02-20 DIAGNOSIS — Y999 Unspecified external cause status: Secondary | ICD-10-CM | POA: Insufficient documentation

## 2019-02-20 DIAGNOSIS — S20219A Contusion of unspecified front wall of thorax, initial encounter: Secondary | ICD-10-CM | POA: Insufficient documentation

## 2019-02-20 DIAGNOSIS — Z1211 Encounter for screening for malignant neoplasm of colon: Secondary | ICD-10-CM

## 2019-02-20 DIAGNOSIS — Z1239 Encounter for other screening for malignant neoplasm of breast: Secondary | ICD-10-CM

## 2019-02-20 DIAGNOSIS — Z79899 Other long term (current) drug therapy: Secondary | ICD-10-CM | POA: Insufficient documentation

## 2019-02-20 DIAGNOSIS — Y9389 Activity, other specified: Secondary | ICD-10-CM | POA: Insufficient documentation

## 2019-02-20 LAB — POCT GLYCOSYLATED HEMOGLOBIN (HGB A1C): Hemoglobin A1C: 7.9 % — AB (ref 4.0–5.6)

## 2019-02-20 LAB — POCT CBG (FASTING - GLUCOSE)-MANUAL ENTRY: Glucose Fasting, POC: 176 mg/dL — AB (ref 70–99)

## 2019-02-20 MED ORDER — CLOPIDOGREL BISULFATE 75 MG PO TABS: 75.0000 mg | ORAL_TABLET | Freq: Every day | ORAL | 0 refills | Status: DC

## 2019-02-20 MED ORDER — ISOSORBIDE MONONITRATE ER 30 MG PO TB24: 30.0000 mg | ORAL_TABLET | Freq: Every day | ORAL | 5 refills | Status: DC

## 2019-02-20 MED ORDER — ATORVASTATIN CALCIUM 40 MG PO TABS: 40.0000 mg | ORAL_TABLET | Freq: Every day | ORAL | 5 refills | Status: DC

## 2019-02-20 MED ORDER — HYDRALAZINE HCL 10 MG PO TABS: 10.0000 mg | ORAL_TABLET | Freq: Three times a day (TID) | ORAL | 5 refills | Status: DC

## 2019-02-20 MED ORDER — AMLODIPINE BESYLATE 10 MG PO TABS: 10.0000 mg | ORAL_TABLET | Freq: Every day | ORAL | 5 refills | Status: DC

## 2019-02-20 MED ORDER — "INSULIN SYRINGE 29G X 1/2"" 1 ML MISC": 1.0000 "application " | Freq: Every day | 5 refills | Status: DC

## 2019-02-20 MED ORDER — ACETAMINOPHEN 500 MG PO TABS
1000.0000 mg | ORAL_TABLET | Freq: Once | ORAL | Status: AC
  Administered 2019-02-20: 1000 mg via ORAL
  Filled 2019-02-20: qty 2

## 2019-02-20 MED ORDER — CARVEDILOL 6.25 MG PO TABS: 6.2500 mg | ORAL_TABLET | Freq: Two times a day (BID) | ORAL | 5 refills | Status: DC

## 2019-02-20 MED ORDER — INSULIN GLARGINE 100 UNIT/ML ~~LOC~~ SOLN: 40.0000 [IU] | Freq: Every day | SUBCUTANEOUS | 5 refills | Status: DC

## 2019-02-20 MED ORDER — DICLOFENAC SODIUM 1 % TD GEL: 2.0000 g | Freq: Four times a day (QID) | TRANSDERMAL | 1 refills | Status: DC

## 2019-02-20 MED ORDER — LOSARTAN POTASSIUM 100 MG PO TABS: 100.0000 mg | ORAL_TABLET | Freq: Every day | ORAL | 5 refills | Status: DC

## 2019-02-20 MED FILL — ISOSORBIDE MN ER 30 MG TAB: 30 | 30 days supply | Qty: 30 | Fill #0

## 2019-02-20 MED FILL — TRUEPLUS SYR 0.5ML 30GX5/16: 30G X 5/16" | 25 days supply | Qty: 100 | Fill #0

## 2019-02-20 MED FILL — !LANTUS 100 UNITS/ML VIAL: 100 | 25 days supply | Qty: 10 | Fill #0

## 2019-02-20 MED FILL — ATORVASTATIN CALCIUM 40 MG: 40 | 30 days supply | Qty: 30 | Fill #0

## 2019-02-20 MED FILL — CARVEDILOL 6.25 MG TABLET: 6.25 | 30 days supply | Qty: 60 | Fill #0

## 2019-02-20 MED FILL — LOSARTAN POTASSIUM 100 MG T: 100 | 30 days supply | Qty: 30 | Fill #0
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
## 2019-02-20 NOTE — Discharge Instructions (Signed)
If you develop sudden onset of shortness of breath or the pain significantly worsens please return for repeat evaluation.

## 2019-02-20 NOTE — Progress Notes (Signed)
Established Patient Office Visit  Subjective:  Patient ID: Tammy Bauer, female    DOB: 02-26-1966  Age: 53 y.o. MRN: [id number]  CC:  Chief Complaint  Patient presents with  . Hypertension  . Diabetes    HPI Tammy Bauer presents for management of diabetes and HTN. Past medical history of HTN, Cryptogenic stroke, secondary to 2 stroke  Double blurring  DM2, and  tobacco abuse. Previous A1C 8/19 (9.9) today down to 7.9 improvement.  Past Medical History:  Diagnosis Date  . Anemia   . Diabetes mellitus without complication ([place])   . Hypertension   . Stroke [facility])    x2; no deficits    Past Surgical History:  Procedure Laterality Date  . ENDOMETRIAL ABLATION N/A 01/30/2018   Procedure: ENDOMETRIAL ABLATION WITH MINERVA;  Surgeon: Florian Buff, MD;  Location: [facility] ORS;  Service: Gynecology;  Laterality: N/A;  . HYSTEROSCOPY W/D&C N/A 01/30/2018   Procedure: DILATATION AND CURETTAGE /HYSTEROSCOPY;  Surgeon: Florian Buff, MD;  Location: [facility] ORS;  Service: Gynecology;  Laterality: N/A;  . LOOP RECORDER INSERTION N/A 06/27/2017   Procedure: Loop Recorder Insertion;  Surgeon: Constance Haw, MD;  Location: [facility] CV LAB;  Service: Cardiovascular;  Laterality: N/A;  . TEE WITHOUT CARDIOVERSION N/A 06/27/2017   Procedure: TRANSESOPHAGEAL ECHOCARDIOGRAM (TEE);  Surgeon: Pixie Casino, MD;  Location: [facility] ENDOSCOPY;  Service: Cardiovascular;  Laterality: N/A;    Family History  Problem Relation Age of Onset  . Stroke Maternal Grandfather     Social History   Socioeconomic History  . Marital status: Married    Spouse name: Not on file  . Number of children: Not on file  . Years of education: Not on file  . Highest education level: Not on file  Occupational History  . Not on file  Social Needs  . Financial resource strain: Not on file  . Food insecurity:    Worry: Not on file    Inability: Not on file  . Transportation needs:    Medical: Not on file   Non-medical: Not on file  Tobacco Use  . Smoking status: Current Every Day Smoker    Packs/day: 0.25    Years: 20.00    Pack years: 5.00    Types: Cigarettes    Start date: 08/29/1999  . Smokeless tobacco: Never Used  . Tobacco comment: smoke 5 cigarettes a day  Substance and Sexual Activity  . Alcohol use: No  . Drug use: No  . Sexual activity: Not Currently    Birth control/protection: None  Lifestyle  . Physical activity:    Days per week: Not on file    Minutes per session: Not on file  . Stress: Not on file  Relationships  . Social connections:    Talks on phone: Not on file    Gets together: Not on file    Attends religious service: Not on file    Active member of club or organization: Not on file    Attends meetings of clubs or organizations: Not on file    Relationship status: Not on file  . Intimate partner violence:    Fear of current or ex partner: Not on file    Emotionally abused: Not on file    Physically abused: Not on file    Forced sexual activity: Not on file  Other Topics Concern  . Not on file  Social History Narrative  . Not on file    Outpatient Medications  Prior to Visit  Medication Sig Dispense Refill  . acetaminophen (TYLENOL) 325 MG tablet Take 650 mg by mouth every 6 (six) hours as needed for mild pain or moderate pain.     . blood glucose meter kit and supplies KIT Dispense based on patient and insurance preference. Use up to four times daily as directed. (FOR ICD-9 250.00, 250.01). 1 each 0  . glucose blood (RELION GLUCOSE TEST STRIPS) test strip Use as instructed 100 each 12  . amLODipine (NORVASC) 10 MG tablet Take 1 tablet (10 mg total) by mouth daily. TAKE ONE TABLET BY MOUTH ONCE DAILY 30 tablet 5  . carvedilol (COREG) 6.25 MG tablet Take 1 tablet (6.25 mg total) by mouth 2 (two) times daily with a meal. 60 tablet 5  . clopidogrel (PLAVIX) 75 MG tablet Take 1 tablet (75 mg total) by mouth daily. 30 tablet 3  . hydrALAZINE (APRESOLINE) 10  MG tablet Take 1 tablet (10 mg total) by mouth 3 (three) times daily. 90 tablet 5  . insulin glargine (LANTUS) 100 UNIT/ML injection Inject 0.4 mLs (40 Units total) into the skin daily. 10 mL 5  . INSULIN SYRINGE 1CC/29G (B-D INSULIN SYRINGE) 29G X 1/2" 1 ML MISC 1 application by Does not apply route at bedtime. 30 each 5  . isosorbide mononitrate (IMDUR) 30 MG 24 hr tablet Take 1 tablet (30 mg total) by mouth daily. 30 tablet 5  . atorvastatin (LIPITOR) 40 MG tablet Take 1 tablet (40 mg total) by mouth at bedtime. Any generic statin from [facility] list is okay 30 tablet 5  . losartan (COZAAR) 100 MG tablet Take 1 tablet (100 mg total) by mouth daily. (Patient not taking: Reported on 02/20/2019) 30 tablet 5   No facility-administered medications prior to visit.     Allergies  Allergen Reactions  . Ace Inhibitors Cough    ROS Review of Systems  Constitutional: Negative.   HENT: Negative.   Eyes: Positive for visual disturbance.  Respiratory: Negative.   Cardiovascular: Negative.   Gastrointestinal: Negative.   Endocrine: Negative.   Genitourinary: Negative.   Musculoskeletal: Positive for back pain.  Skin: Negative.   Allergic/Immunologic: Negative.   Neurological: Negative.   Hematological: Negative.   Psychiatric/Behavioral: Negative.       Objective:    Physical Exam  Constitutional: She is oriented to person, place, and time. She appears well-developed and well-nourished.  HENT:  Head: Normocephalic and atraumatic.  Eyes: Pupils are equal, round, and reactive to light. EOM are normal.  Neck: Normal range of motion. Neck supple.  Cardiovascular: Normal rate and regular rhythm.  Pulmonary/Chest: Effort normal and breath sounds normal.  Abdominal: Soft. Bowel sounds are normal.  Musculoskeletal: Normal range of motion.  Neurological: She is alert and oriented to person, place, and time.  Skin: Skin is warm.  Psychiatric: She has a normal mood and affect.    BP (!)  147/96 (BP Location: Left Arm, Patient Position: Sitting, Cuff Size: Normal)   Pulse 89   Temp 97.8 F (36.6 C) (Oral)   Ht 5' 2" (1.575 m)   Wt 145 lb 6.4 oz (66 kg)   SpO2 96%   BMI 26.59 kg/m  Wt Readings from Last 3 Encounters:  02/20/19 145 lb 6.4 oz (66 kg)  08/10/18 145 lb (65.8 kg)  07/22/18 141 lb 3.2 oz (64 kg)     Health Maintenance Due  Topic Date Due  . OPHTHALMOLOGY EXAM  03/15/1978  . MAMMOGRAM  03/15/2018  .  Fecal DNA (Cologuard)  03/15/2018  . HEMOGLOBIN A1C  01/22/2019    There are no preventive care reminders to display for this patient.  No results found for: TSH Lab Results  Component Value Date   WBC 9.7 02/11/2018   HGB 13.4 02/11/2018   HCT 41.0 02/11/2018   MCV 93 02/11/2018   PLT 558 (H) 02/11/2018   Lab Results  Component Value Date   NA 140 07/22/2018   K 4.1 07/22/2018   CO2 21 07/22/2018   GLUCOSE 146 (H) 07/22/2018   BUN 11 07/22/2018   CREATININE 0.64 07/22/2018   BILITOT 0.4 01/25/2018   ALKPHOS 46 01/25/2018   AST 10 (L) 01/25/2018   ALT 11 (L) 01/25/2018   PROT 7.7 01/25/2018   ALBUMIN 4.1 01/25/2018   CALCIUM 10.4 (H) 07/22/2018   ANIONGAP 11 01/25/2018   Lab Results  Component Value Date   CHOL 226 (H) 07/22/2018   Lab Results  Component Value Date   HDL 36 (L) 07/22/2018   Lab Results  Component Value Date   LDLCALC 155 (H) 07/22/2018   Lab Results  Component Value Date   TRIG 176 (H) 07/22/2018   Lab Results  Component Value Date   CHOLHDL 6.3 (H) 07/22/2018   Lab Results  Component Value Date   HGBA1C 7.9 (A) 02/20/2019      Assessment & Plan:   Problem List Items Addressed This Visit    Essential hypertension (Chronic)   Relevant Medications   amLODipine (NORVASC) 10 MG tablet   carvedilol (COREG) 6.25 MG tablet   hydrALAZINE (APRESOLINE) 10 MG tablet   isosorbide mononitrate (IMDUR) 30 MG 24 hr tablet   losartan (COZAAR) 100 MG tablet   atorvastatin (LIPITOR) 40 MG tablet   DM2 (diabetes  mellitus, type 2) (HCC) - Primary (Chronic)   Relevant Medications   insulin glargine (LANTUS) 100 UNIT/ML injection   INSULIN SYRINGE 1CC/29G (B-D INSULIN SYRINGE) 29G X 1/2" 1 ML MISC   losartan (COZAAR) 100 MG tablet   atorvastatin (LIPITOR) 40 MG tablet   Other Relevant Orders   HgB A1c (Completed)   Glucose (CBG), Fasting (Completed)   CBC with Differential   Lipid Panel   Ambulatory referral to Ophthalmology   Tobacco abuse    Other Visit Diagnoses    Medication refill       Relevant Medications   clopidogrel (PLAVIX) 75 MG tablet   Screening for breast cancer       Relevant Orders   MM Digital Diagnostic Bilat   Hypertension, unspecified type       Relevant Medications   amLODipine (NORVASC) 10 MG tablet   carvedilol (COREG) 6.25 MG tablet   hydrALAZINE (APRESOLINE) 10 MG tablet   isosorbide mononitrate (IMDUR) 30 MG 24 hr tablet   losartan (COZAAR) 100 MG tablet   atorvastatin (LIPITOR) 40 MG tablet   Other Relevant Orders   Comprehensive metabolic panel   Colon cancer screening       Relevant Orders   Fecal occult blood, imunochemical    Malanie was seen today for hypertension and diabetes.  Diagnoses and all orders for this visit:  Type 2 diabetes mellitus with other circulatory complication, with long-term current use of insulin (HCC) -     HgB A1c -     Glucose (CBG), Fasting -     CBC with Differential -     Lipid Panel -     Ambulatory referral to Ophthalmology  Medication refill Followed by Dr.  Leonie Man will refill today than f/u with Dr. Leonie Man for future refills -     clopidogrel (PLAVIX) 75 MG tablet; Take 1 tablet (75 mg total) by mouth daily.  Type 2 diabetes mellitus without complication, without long-term current use of insulin (HCC) AIC improved cont current medication. Discussed reduction in carbs and re start exercising (walking) -     INSULIN SYRINGE 1CC/29G (B-D INSULIN SYRINGE) 29G X 1/2" 1 ML MISC; 1 application by Does not apply route at  bedtime.  Tobacco abuse Discussed each visit cessation and increased risk for other strokes.  Screening for breast cancer Health maintenance past due order placed  -     MM Digital Diagnostic Bilat; Future  Hypertension, unspecified type Out of Bp meds.  Refilled meds  return in 2 weeks for Bp check   amLODipine (NORVASC) 10 MG tablet; Take 1 tablet (10 mg total) by mouth daily. TAKE ONE TABLET BY MOUTH ONCE DAILY -     carvedilol (COREG) 6.25 MG tablet; Take 1 tablet (6.25 mg total) by mouth 2 (two) times daily with a meal. -     hydrALAZINE (APRESOLINE) 10 MG tablet; Take 1 tablet (10 mg total) by mouth 3 (three) times daily. Losartan (COZAAR) 100 mg  Take 1 tablet (100 mg total) by mouth daily., Starting Thu 02/20/2019, Normal  Dose, Route, Frequency: 100 mg, Oral, Daily  -     Comprehensive metabolic panel  Colon cancer screening guidelines start at age 32 Health maintenance  -     Fecal occult blood, imunochemical; Future   Meds ordered this encounter  Medications  . amLODipine (NORVASC) 10 MG tablet    Sig: Take 1 tablet (10 mg total) by mouth daily. TAKE ONE TABLET BY MOUTH ONCE DAILY    Dispense:  30 tablet    Refill:  5  . carvedilol (COREG) 6.25 MG tablet    Sig: Take 1 tablet (6.25 mg total) by mouth 2 (two) times daily with a meal.    Dispense:  60 tablet    Refill:  5  . clopidogrel (PLAVIX) 75 MG tablet    Sig: Take 1 tablet (75 mg total) by mouth daily.    Dispense:  30 tablet    Refill:  0  . hydrALAZINE (APRESOLINE) 10 MG tablet    Sig: Take 1 tablet (10 mg total) by mouth 3 (three) times daily.    Dispense:  90 tablet    Refill:  5  . insulin glargine (LANTUS) 100 UNIT/ML injection    Sig: Inject 0.4 mLs (40 Units total) into the skin daily.    Dispense:  10 mL    Refill:  5    E11.10  . INSULIN SYRINGE 1CC/29G (B-D INSULIN SYRINGE) 29G X 1/2" 1 ML MISC    Sig: 1 application by Does not apply route at bedtime.    Dispense:  30 each    Refill:  5  .  isosorbide mononitrate (IMDUR) 30 MG 24 hr tablet    Sig: Take 1 tablet (30 mg total) by mouth daily.    Dispense:  30 tablet    Refill:  5  . losartan (COZAAR) 100 MG tablet    Sig: Take 1 tablet (100 mg total) by mouth daily.    Dispense:  30 tablet    Refill:  5  . atorvastatin (LIPITOR) 40 MG tablet    Sig: Take 1 tablet (40 mg total) by mouth at bedtime. Any generic statin from [facility] list is  okay    Dispense:  30 tablet    Refill:  5   -     atorvastatin (LIPITOR) 40 MG tablet; Take 1 tablet (40 mg total) by mouth at bedtime. Any generic statin from [facility] list is okay  Other orders -     amLODipine (NORVASC) 10 MG tablet; Take 1 tablet (10 mg total) by mouth daily. TAKE ONE TABLET BY MOUTH ONCE DAILY -     carvedilol (COREG) 6.25 MG tablet; Take 1 tablet (6.25 mg total) by mouth 2 (two) times daily with a meal. -     hydrALAZINE (APRESOLINE) 10 MG tablet; Take 1 tablet (10 mg total) by mouth 3 (three) times daily. -     insulin glargine (LANTUS) 100 UNIT/ML injection; Inject 0.4 mLs (40 Units total) into the skin daily. -     isosorbide mononitrate (IMDUR) 30 MG 24 hr tablet; Take 1 tablet (30 mg total) by mouth daily. -     losartan (COZAAR) 100 MG tablet; Take 1 tablet (100 mg total) by mouth daily.    Meds ordered this encounter  Medications  . amLODipine (NORVASC) 10 MG tablet    Sig: Take 1 tablet (10 mg total) by mouth daily. TAKE ONE TABLET BY MOUTH ONCE DAILY    Dispense:  30 tablet    Refill:  5  . carvedilol (COREG) 6.25 MG tablet    Sig: Take 1 tablet (6.25 mg total) by mouth 2 (two) times daily with a meal.    Dispense:  60 tablet    Refill:  5  . clopidogrel (PLAVIX) 75 MG tablet    Sig: Take 1 tablet (75 mg total) by mouth daily.    Dispense:  30 tablet    Refill:  0  . hydrALAZINE (APRESOLINE) 10 MG tablet    Sig: Take 1 tablet (10 mg total) by mouth 3 (three) times daily.    Dispense:  90 tablet    Refill:  5  . insulin glargine (LANTUS) 100 UNIT/ML  injection    Sig: Inject 0.4 mLs (40 Units total) into the skin daily.    Dispense:  10 mL    Refill:  5    E11.10  . INSULIN SYRINGE 1CC/29G (B-D INSULIN SYRINGE) 29G X 1/2" 1 ML MISC    Sig: 1 application by Does not apply route at bedtime.    Dispense:  30 each    Refill:  5  . isosorbide mononitrate (IMDUR) 30 MG 24 hr tablet    Sig: Take 1 tablet (30 mg total) by mouth daily.    Dispense:  30 tablet    Refill:  5  . losartan (COZAAR) 100 MG tablet    Sig: Take 1 tablet (100 mg total) by mouth daily.    Dispense:  30 tablet    Refill:  5  . atorvastatin (LIPITOR) 40 MG tablet    Sig: Take 1 tablet (40 mg total) by mouth at bedtime. Any generic statin from [facility] list is okay    Dispense:  30 tablet    Refill:  5    Follow-up: Return in about 3 months (around 05/23/2019) for HTN, Diabetes,.    Kerin Perna, NP

## 2019-02-20 NOTE — Telephone Encounter (Signed)
Patient aware that refill will be sent once her OV is complete today. Maryjean Morn, CMA
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
## 2019-02-21 ENCOUNTER — Telehealth (INDEPENDENT_AMBULATORY_CARE_PROVIDER_SITE_OTHER): Payer: Self-pay

## 2019-02-21 LAB — CBC WITH DIFFERENTIAL/PLATELET
BASOS: 1 %
Basophils Absolute: 0 10*3/uL (ref 0.0–0.2)
EOS (ABSOLUTE): 0 10*3/uL (ref 0.0–0.4)
Eos: 1 %
Hematocrit: 48.5 % — ABNORMAL HIGH (ref 34.0–46.6)
Hemoglobin: 16.3 g/dL — ABNORMAL HIGH (ref 11.1–15.9)
Immature Grans (Abs): 0 10*3/uL (ref 0.0–0.1)
Immature Granulocytes: 0 %
Lymphocytes Absolute: 2 10*3/uL (ref 0.7–3.1)
Lymphs: 31 %
MCH: 31.2 pg (ref 26.6–33.0)
MCHC: 33.6 g/dL (ref 31.5–35.7)
MCV: 93 fL (ref 79–97)
Monocytes Absolute: 0.6 10*3/uL (ref 0.1–0.9)
Monocytes: 9 %
NEUTROS ABS: 3.8 10*3/uL (ref 1.4–7.0)
Neutrophils: 58 %
Platelets: 330 10*3/uL (ref 150–450)
RBC: 5.22 x10E6/uL (ref 3.77–5.28)
RDW: 13.1 % (ref 11.7–15.4)
WBC: 6.5 10*3/uL (ref 3.4–10.8)

## 2019-02-21 LAB — COMPREHENSIVE METABOLIC PANEL
ALT: 13 IU/L (ref 0–32)
AST: 15 IU/L (ref 0–40)
Albumin/Globulin Ratio: 1.5 (ref 1.2–2.2)
Albumin: 4.3 g/dL (ref 3.8–4.8)
Alkaline Phosphatase: 57 IU/L (ref 39–117)
BUN/Creatinine Ratio: 12 (ref 9–23)
BUN: 8 mg/dL (ref 6–24)
Bilirubin Total: 0.4 mg/dL (ref 0.0–1.2)
CALCIUM: 10.2 mg/dL (ref 8.7–10.2)
CO2: 20 mmol/L (ref 20–29)
Chloride: 103 mmol/L (ref 96–106)
Creatinine, Ser: 0.66 mg/dL (ref 0.57–1.00)
GFR calc Af Amer: 119 mL/min/{1.73_m2} (ref >59)
GFR calc non Af Amer: 103 mL/min/{1.73_m2} (ref >59)
GLUCOSE: 139 mg/dL — AB (ref 65–99)
Globulin, Total: 2.8 g/dL (ref 1.5–4.5)
Potassium: 3.8 mmol/L (ref 3.5–5.2)
Sodium: 137 mmol/L (ref 134–144)
Total Protein: 7.1 g/dL (ref 6.0–8.5)

## 2019-02-21 LAB — LIPID PANEL
Chol/HDL Ratio: 5.4 ratio — ABNORMAL HIGH (ref 0.0–4.4)
Cholesterol, Total: 205 mg/dL — ABNORMAL HIGH (ref 100–199)
HDL: 38 mg/dL — ABNORMAL LOW (ref >39)
LDL Calculated: 135 mg/dL — ABNORMAL HIGH (ref 0–99)
Triglycerides: 161 mg/dL — ABNORMAL HIGH (ref 0–149)
VLDL Cholesterol Cal: 32 mg/dL (ref 5–40)

## 2019-02-21 NOTE — Telephone Encounter (Signed)
-----   Message from Grayce Sessions, NP sent at 02/21/2019 12:10 PM EDT ----- Elevated chol refilled chol meds take at night

## 2019-02-21 NOTE — Telephone Encounter (Signed)
Patient is aware that cholesterol is elevated and reminded that cholesterol medication was refilled and to take it at night. Maryjean Morn, CMA

## 2019-02-27 ENCOUNTER — Ambulatory Visit (INDEPENDENT_AMBULATORY_CARE_PROVIDER_SITE_OTHER): Payer: Self-pay | Admitting: *Deleted

## 2019-02-27 ENCOUNTER — Other Ambulatory Visit: Payer: Self-pay

## 2019-02-27 DIAGNOSIS — I639 Cerebral infarction, unspecified: Secondary | ICD-10-CM

## 2019-03-01 LAB — CUP PACEART REMOTE DEVICE CHECK
Date Time Interrogation Session: 20200326140848
Implantable Pulse Generator Implant Date: 20180725

## 2019-03-03 ENCOUNTER — Ambulatory Visit (INDEPENDENT_AMBULATORY_CARE_PROVIDER_SITE_OTHER): Payer: Self-pay

## 2019-03-05 NOTE — Progress Notes (Signed)
[facility] Summary Report / Loop Recorder 

## 2019-03-21 MED FILL — AMLODIPINE BESYLATE 10 MG T: 10 | 30 days supply | Qty: 30 | Fill #2

## 2019-03-21 MED FILL — CLOPIDOGREL 75 MG TABLET: 75 | 30 days supply | Qty: 30 | Fill #3

## 2019-03-21 MED FILL — hydrALAZINE HCL 10 MG TABS: 10 | 30 days supply | Qty: 90 | Fill #3

## 2019-03-22 MED FILL — LOSARTAN POTASSIUM 100 MG T: 100 | 30 days supply | Qty: 30 | Fill #1

## 2019-04-01 ENCOUNTER — Ambulatory Visit (INDEPENDENT_AMBULATORY_CARE_PROVIDER_SITE_OTHER): Payer: Self-pay | Admitting: *Deleted

## 2019-04-01 ENCOUNTER — Other Ambulatory Visit: Payer: Self-pay

## 2019-04-01 DIAGNOSIS — I639 Cerebral infarction, unspecified: Secondary | ICD-10-CM

## 2019-04-01 LAB — CUP PACEART REMOTE DEVICE CHECK
Date Time Interrogation Session: 20200428144219
Implantable Pulse Generator Implant Date: 20180725

## 2019-04-10 NOTE — Progress Notes (Signed)
[facility] Summary Report / Loop Recorder 

## 2019-05-05 ENCOUNTER — Ambulatory Visit (INDEPENDENT_AMBULATORY_CARE_PROVIDER_SITE_OTHER): Payer: Self-pay | Admitting: *Deleted

## 2019-05-05 DIAGNOSIS — I639 Cerebral infarction, unspecified: Secondary | ICD-10-CM

## 2019-05-05 LAB — CUP PACEART REMOTE DEVICE CHECK
Date Time Interrogation Session: 20200531144010
Implantable Pulse Generator Implant Date: 20180725

## 2019-05-08 MED FILL — ?AMLODIPINE BESYLATE 10 MG: 10 | 30 days supply | Qty: 30 | Fill #3

## 2019-05-08 MED FILL — ?ATORVASTATIN 40MG TABLET: 40 | 30 days supply | Qty: 30 | Fill #1

## 2019-05-08 MED FILL — CLOPIDOGREL 75 MG TABLET: 75 | 30 days supply | Qty: 30 | Fill #0

## 2019-05-08 MED FILL — LOSARTAN POTASSIUM 100 MG T: 100 | 30 days supply | Qty: 30 | Fill #2

## 2019-05-08 MED FILL — ?CARVEDILOL 6.25 MG TABLET: 6.25 | 30 days supply | Qty: 60 | Fill #1

## 2019-05-08 MED FILL — ISOSORBIDE MN ER 30 MG TAB: 30 | 30 days supply | Qty: 30 | Fill #1

## 2019-05-08 MED FILL — hydrALAZINE HCL 10 MG TABS: 10 | 30 days supply | Qty: 90 | Fill #4

## 2019-05-13 NOTE — Progress Notes (Signed)
[facility] Summary Report / Loop Recorder 

## 2019-05-19 ENCOUNTER — Encounter (INDEPENDENT_AMBULATORY_CARE_PROVIDER_SITE_OTHER): Payer: Self-pay | Admitting: Primary Care

## 2019-05-19 ENCOUNTER — Other Ambulatory Visit: Payer: Self-pay

## 2019-05-19 ENCOUNTER — Ambulatory Visit (INDEPENDENT_AMBULATORY_CARE_PROVIDER_SITE_OTHER): Payer: Self-pay | Admitting: Primary Care

## 2019-05-19 VITALS — BP 115/76 | HR 82 | Temp 97.8°F | Ht 62.0 in | Wt 150.4 lb

## 2019-05-19 DIAGNOSIS — Z720 Tobacco use: Secondary | ICD-10-CM

## 2019-05-19 DIAGNOSIS — E782 Mixed hyperlipidemia: Secondary | ICD-10-CM

## 2019-05-19 DIAGNOSIS — E1159 Type 2 diabetes mellitus with other circulatory complications: Secondary | ICD-10-CM

## 2019-05-19 DIAGNOSIS — Z1211 Encounter for screening for malignant neoplasm of colon: Secondary | ICD-10-CM

## 2019-05-19 DIAGNOSIS — Z794 Long term (current) use of insulin: Secondary | ICD-10-CM

## 2019-05-19 DIAGNOSIS — I10 Essential (primary) hypertension: Secondary | ICD-10-CM

## 2019-05-19 LAB — POCT CBG (FASTING - GLUCOSE)-MANUAL ENTRY: Glucose Fasting, POC: 215 mg/dL — AB (ref 70–99)

## 2019-05-19 MED ORDER — CLONIDINE HCL 0.1 MG PO TABS
0.1000 mg | ORAL_TABLET | Freq: Once | ORAL | Status: AC
  Administered 2019-05-19: 0.1 mg via ORAL

## 2019-05-19 MED ORDER — HYDRALAZINE HCL 50 MG PO TABS: 50.0000 mg | ORAL_TABLET | Freq: Three times a day (TID) | ORAL | 0 refills | Status: DC

## 2019-05-19 MED FILL — hydrALAZINE HCL 50 MG TABS: 50 | 30 days supply | Qty: 90 | Fill #0

## 2019-05-19 NOTE — Addendum Note (Signed)
Addended by: Nat Christen on: 05/19/2019 10:24 AM   Modules accepted: Orders

## 2019-05-19 NOTE — Progress Notes (Signed)
Established Patient Office Visit  Subjective:  Patient ID: Tammy Bauer, female    DOB: Dec 09, 1965  Age: 53 y.o. MRN: [id number]  CC:  Chief Complaint  Patient presents with  . Follow-up    HTN/DM    HPI Tammy Bauer presents for management of T2D and HTN.. Past medical history of Cryptogenic stroke, double vision secondary to blurry and tobacco abuse.  Past Medical History:  Diagnosis Date  . Anemia   . Diabetes mellitus without complication ([place])   . Hypertension   . Stroke [facility])    x2; no deficits    Past Surgical History:  Procedure Laterality Date  . ENDOMETRIAL ABLATION N/A 01/30/2018   Procedure: ENDOMETRIAL ABLATION WITH MINERVA;  Surgeon: Florian Buff, MD;  Location: [facility];  Service: Gynecology;  Laterality: N/A;  . HYSTEROSCOPY W/D&C N/A 01/30/2018   Procedure: DILATATION AND CURETTAGE /HYSTEROSCOPY;  Surgeon: Florian Buff, MD;  Location: [facility];  Service: Gynecology;  Laterality: N/A;  . LOOP RECORDER INSERTION N/A 06/27/2017   Procedure: Loop Recorder Insertion;  Surgeon: Constance Haw, MD;  Location: [facility];  Service: Cardiovascular;  Laterality: N/A;  . TEE WITHOUT CARDIOVERSION N/A 06/27/2017   Procedure: TRANSESOPHAGEAL ECHOCARDIOGRAM (TEE);  Surgeon: Pixie Casino, MD;  Location: [facility] ENDOSCOPY;  Service: Cardiovascular;  Laterality: N/A;    Family History  Problem Relation Age of Onset  . Stroke Maternal Grandfather     Social History   Socioeconomic History  . Marital status: Married    Spouse name: Not on file  . Number of children: Not on file  . Years of education: Not on file  . Highest education level: Not on file  Occupational History  . Not on file  Social Needs  . Financial resource strain: Not on file  . Food insecurity    Worry: Not on file    Inability: Not on file  . Transportation needs    Medical: Not on file    Non-medical: Not on file  Tobacco Use  . Smoking status: Current Every Day Smoker     Packs/day: 0.25    Years: 20.00    Pack years: 5.00    Types: Cigarettes    Start date: 08/29/1999  . Smokeless tobacco: Never Used  . Tobacco comment: smoke 5 cigarettes a day  Substance and Sexual Activity  . Alcohol use: No  . Drug use: No  . Sexual activity: Not Currently    Birth control/protection: None  Lifestyle  . Physical activity    Days per week: Not on file    Minutes per session: Not on file  . Stress: Not on file  Relationships  . Social Herbalist on phone: Not on file    Gets together: Not on file    Attends religious service: Not on file    Active member of club or organization: Not on file    Attends meetings of clubs or organizations: Not on file    Relationship status: Not on file  . Intimate partner violence    Fear of current or ex partner: Not on file    Emotionally abused: Not on file    Physically abused: Not on file    Forced sexual activity: Not on file  Other Topics Concern  . Not on file  Social History Narrative  . Not on file    Outpatient Medications Prior to Visit  Medication Sig Dispense Refill  . acetaminophen (TYLENOL)  325 MG tablet Take 650 mg by mouth every 6 (six) hours as needed for mild pain or moderate pain.     Marland Kitchen amLODipine (NORVASC) 10 MG tablet Take 1 tablet (10 mg total) by mouth daily. TAKE ONE TABLET BY MOUTH ONCE DAILY 30 tablet 5  . atorvastatin (LIPITOR) 40 MG tablet Take 1 tablet (40 mg total) by mouth at bedtime. Any generic statin from [facility] list is okay 30 tablet 5  . blood glucose meter kit and supplies KIT Dispense based on patient and insurance preference. Use up to four times daily as directed. (FOR ICD-9 250.00, 250.01). 1 each 0  . carvedilol (COREG) 6.25 MG tablet Take 1 tablet (6.25 mg total) by mouth 2 (two) times daily with a meal. 60 tablet 5  . clopidogrel (PLAVIX) 75 MG tablet Take 1 tablet (75 mg total) by mouth daily. 30 tablet 0  . diclofenac sodium (VOLTAREN) 1 % GEL Apply 2 g topically  4 (four) times daily. 100 g 1  . glucose blood (RELION GLUCOSE TEST STRIPS) test strip Use as instructed 100 each 12  . insulin glargine (LANTUS) 100 UNIT/ML injection Inject 0.4 mLs (40 Units total) into the skin daily. 10 mL 5  . INSULIN SYRINGE 1CC/29G (B-D INSULIN SYRINGE) 29G X 1/2" 1 ML MISC 1 application by Does not apply route at bedtime. 30 each 5  . isosorbide mononitrate (IMDUR) 30 MG 24 hr tablet Take 1 tablet (30 mg total) by mouth daily. 30 tablet 5  . losartan (COZAAR) 100 MG tablet Take 1 tablet (100 mg total) by mouth daily. 30 tablet 5  . hydrALAZINE (APRESOLINE) 10 MG tablet Take 1 tablet (10 mg total) by mouth 3 (three) times daily. 90 tablet 5   No facility-administered medications prior to visit.     Allergies  Allergen Reactions  . Ace Inhibitors Cough    ROS Review of Systems  Constitutional: Negative.   HENT: Negative.   Eyes: Positive for visual disturbance.  Respiratory: Negative.   Cardiovascular: Negative.   Gastrointestinal: Negative.   Endocrine: Negative.   Genitourinary: Negative.   Musculoskeletal: Negative.   Skin: Negative.   Allergic/Immunologic: Positive for environmental allergies.  Neurological: Negative.   Hematological: Negative.   Psychiatric/Behavioral: Negative.       Objective:    Physical Exam  Constitutional: She is oriented to person, place, and time. She appears well-developed and well-nourished.  HENT:  Head: Normocephalic.  Eyes: EOM are normal.  Neck: Normal range of motion. Neck supple.  Cardiovascular: Normal rate and regular rhythm.  Pulmonary/Chest: Effort normal and breath sounds normal.  Abdominal: Bowel sounds are normal. She exhibits distension.  Neurological: She is oriented to person, place, and time.  Skin: Skin is warm and dry.  Psychiatric: She has a normal mood and affect.    BP (!) 173/105 (BP Location: Left Arm, Patient Position: Sitting, Cuff Size: Normal)   Pulse 81   Temp 97.8 F (36.6 C)  (Oral)   Ht _0  (1.575 m)   Wt 150 lb 6.4 oz (68.2 kg)   SpO2 96%   BMI 27.51 kg/m  Wt Readings from Last 3 Encounters:  05/19/19 150 lb 6.4 oz (68.2 kg)  02/20/19 145 lb 4.5 oz (65.9 kg)  02/20/19 145 lb 6.4 oz (66 kg)     Health Maintenance Due  Topic Date Due  . OPHTHALMOLOGY EXAM  03/15/1978  . MAMMOGRAM  03/15/2018  . Fecal DNA (Cologuard)  03/15/2018    There are no  preventive care reminders to display for this patient.  No results found for: TSH Lab Results  Component Value Date   WBC 6.5 02/20/2019   HGB 16.3 (H) 02/20/2019   HCT 48.5 (H) 02/20/2019   MCV 93 02/20/2019   PLT 330 02/20/2019   Lab Results  Component Value Date   NA 137 02/20/2019   K 3.8 02/20/2019   CO2 20 02/20/2019   GLUCOSE 139 (H) 02/20/2019   BUN 8 02/20/2019   CREATININE 0.66 02/20/2019   BILITOT 0.4 02/20/2019   ALKPHOS 57 02/20/2019   AST 15 02/20/2019   ALT 13 02/20/2019   PROT 7.1 02/20/2019   ALBUMIN 4.3 02/20/2019   CALCIUM 10.2 02/20/2019   ANIONGAP 11 01/25/2018   Lab Results  Component Value Date   CHOL 205 (H) 02/20/2019   Lab Results  Component Value Date   HDL 38 (L) 02/20/2019   Lab Results  Component Value Date   LDLCALC 135 (H) 02/20/2019   Lab Results  Component Value Date   TRIG 161 (H) 02/20/2019   Lab Results  Component Value Date   CHOLHDL 5.4 (H) 02/20/2019   Lab Results  Component Value Date   HGBA1C 7.9 (A) 02/20/2019      Assessment & Plan:   Problem List Items Addressed This Visit    DM2 (diabetes mellitus, type 2) ([place]) - Primary (Chronic)   Relevant Orders   Glucose (CBG), Fasting (Completed)   Hemoglobin A1c   Lipid Panel   Tobacco abuse    Other Visit Diagnoses    Hypertension, unspecified type       Relevant Medications   hydrALAZINE (APRESOLINE) 50 MG tablet   Other Relevant Orders   Hemoglobin A1c   Lipid Panel   Comprehensive metabolic panel   Colon cancer screening       Relevant Orders   Fecal occult blood,  imunochemical   Mixed hyperlipidemia       Relevant Medications   hydrALAZINE (APRESOLINE) 50 MG tablet   Other Relevant Orders   Lipid Panel    Emmanuelle was seen today for follow-up.  Diagnoses and all orders for this visit:  Type 2 diabetes mellitus with other circulatory complication, with long-term current use of insulin (HCC) -     Glucose (CBG), Fasting -     Hemoglobin A1c -     Lipid Panel  Tobacco abuse Hx of CVA and with uncontrolled DM, and Bp this can increase risk for another stroke. Pt is fully aware of the risk. Each visit d/w cessation  Hypertension, unspecified type Elevated tx with .58m of clonodine little to no affect . Increased Hydralyzine -     Hemoglobin A1c 7.9 -     Lipid Panel -     Comprehensive metabolic panel; Future  Colon cancer screening -     Fecal occult blood, imunochemical; Future  Mixed hyperlipidemia Currently on atorvastatin  -     Lipid Panel  Other orders -     hydrALAZINE (APRESOLINE) 50 MG tablet; Take 1 tablet (50 mg total) by mouth 3 (three) times daily for 30 days.    Meds ordered this encounter  Medications  . hydrALAZINE (APRESOLINE) 50 MG tablet    Sig: Take 1 tablet (50 mg total) by mouth 3 (three) times daily for 30 days.    Dispense:  90 tablet    Refill:  0    Follow-up: Return in about 3 months (around 08/19/2019) for HTN, T2D.  Kerin Perna, NP

## 2019-05-19 NOTE — Patient Instructions (Signed)
Diabetes Mellitus and Nutrition, Adult When you have diabetes (diabetes mellitus), it is very important to have healthy eating habits because your blood sugar (glucose) levels are greatly affected by what you eat and drink. Eating healthy foods in the appropriate amounts, at about the same times every day, can help you:  Control your blood glucose.  Lower your risk of heart disease.  Improve your blood pressure.  Reach or maintain a healthy weight. Every person with diabetes is different, and each person has different needs for a meal plan. Your health care provider may recommend that you work with a diet and nutrition specialist (dietitian) to make a meal plan that is best for you. Your meal plan may vary depending on factors such as:  The calories you need.  The medicines you take.  Your weight.  Your blood glucose, blood pressure, and cholesterol levels.  Your activity level.  Other health conditions you have, such as heart or kidney disease. How do carbohydrates affect me? Carbohydrates, also called carbs, affect your blood glucose level more than any other type of food. Eating carbs naturally raises the amount of glucose in your blood. Carb counting is a method for keeping track of how many carbs you eat. Counting carbs is important to keep your blood glucose at a healthy level, especially if you use insulin or take certain oral diabetes medicines. It is important to know how many carbs you can safely have in each meal. This is different for every person. Your dietitian can help you calculate how many carbs you should have at each meal and for each snack. Foods that contain carbs include:  Bread, cereal, rice, pasta, and crackers.  Potatoes and corn.  Peas, beans, and lentils.  Milk and yogurt.  Fruit and juice.  Desserts, such as cakes, cookies, ice cream, and candy. How does alcohol affect me? Alcohol can cause a sudden decrease in blood glucose (hypoglycemia),  especially if you use insulin or take certain oral diabetes medicines. Hypoglycemia can be a life-threatening condition. Symptoms of hypoglycemia (sleepiness, dizziness, and confusion) are similar to symptoms of having too much alcohol. If your health care provider says that alcohol is safe for you, follow these guidelines:  Limit alcohol intake to no more than 1 drink per day for nonpregnant women and 2 drinks per day for men. One drink equals 12 oz of beer, 5 oz of wine, or 1 oz of hard liquor.  Do not drink on an empty stomach.  Keep yourself hydrated with water, diet soda, or unsweetened iced tea.  Keep in mind that regular soda, juice, and other mixers may contain a lot of sugar and must be counted as carbs. What are tips for following this plan?  Reading food labels  Start by checking the serving size on the "Nutrition Facts" label of packaged foods and drinks. The amount of calories, carbs, fats, and other nutrients listed on the label is based on one serving of the item. Many items contain more than one serving per package.  Check the total grams (g) of carbs in one serving. You can calculate the number of servings of carbs in one serving by dividing the total carbs by 15. For example, if a food has 30 g of total carbs, it would be equal to 2 servings of carbs.  Check the number of grams (g) of saturated and trans fats in one serving. Choose foods that have low or no amount of these fats.  Check the number of   milligrams (mg) of salt (sodium) in one serving. Most people should limit total sodium intake to less than 2,300 mg per day.  Always check the nutrition information of foods labeled as "low-fat" or "nonfat". These foods may be higher in added sugar or refined carbs and should be avoided.  Talk to your dietitian to identify your daily goals for nutrients listed on the label. Shopping  Avoid buying canned, premade, or processed foods. These foods tend to be high in fat, sodium,  and added sugar.  Shop around the outside edge of the grocery store. This includes fresh fruits and vegetables, bulk grains, fresh meats, and fresh dairy. Cooking  Use low-heat cooking methods, such as baking, instead of high-heat cooking methods like deep frying.  Cook using healthy oils, such as olive, canola, or sunflower oil.  Avoid cooking with butter, cream, or high-fat meats. Meal planning  Eat meals and snacks regularly, preferably at the same times every day. Avoid going long periods of time without eating.  Eat foods high in fiber, such as fresh fruits, vegetables, beans, and whole grains. Talk to your dietitian about how many servings of carbs you can eat at each meal.  Eat 4-6 ounces (oz) of lean protein each day, such as lean meat, chicken, fish, eggs, or tofu. One oz of lean protein is equal to: ? 1 oz of meat, chicken, or fish. ? 1 egg. ?  cup of tofu.  Eat some foods each day that contain healthy fats, such as avocado, nuts, seeds, and fish. Lifestyle  Check your blood glucose regularly.  Exercise regularly as told by your health care provider. This may include: ? 150 minutes of moderate-intensity or vigorous-intensity exercise each week. This could be brisk walking, biking, or water aerobics. ? Stretching and doing strength exercises, such as yoga or weightlifting, at least 2 times a week.  Take medicines as told by your health care provider.  Do not use any products that contain nicotine or tobacco, such as cigarettes and e-cigarettes. If you need help quitting, ask your health care provider.  Work with a Social worker or diabetes educator to identify strategies to manage stress and any emotional and social challenges. Questions to ask a health care provider  Do I need to meet with a diabetes educator?  Do I need to meet with a dietitian?  What number can I call if I have questions?  When are the best times to check my blood glucose? Where to find more  information:  American Diabetes Association: diabetes.org  Academy of Nutrition and Dietetics: www.eatright.[employer] of Diabetes and Digestive and Kidney Diseases (NIH): [employer] Summary  A healthy meal plan will help you control your blood glucose and maintain a healthy lifestyle.  Working with a diet and nutrition specialist (dietitian) can help you make a meal plan that is best for you.  Keep in mind that carbohydrates (carbs) and alcohol have immediate effects on your blood glucose levels. It is important to count carbs and to use alcohol carefully. This information is not intended to replace advice given to you by your health care provider. Make sure you discuss any questions you have with your health care provider. Document Released: 08/17/2005 Document Revised: 06/20/2017 Document Reviewed: 12/25/2016 Elsevier Interactive Patient Education  2019 [employer]. Hypertension Hypertension, commonly called high blood pressure, is when the force of blood pumping through the arteries is too strong. The arteries are the blood vessels that carry blood from the heart throughout the body.  Hypertension forces the heart to work harder to pump blood and may cause arteries to become narrow or stiff. Having untreated or uncontrolled hypertension can cause heart attacks, strokes, kidney disease, and other problems. A blood pressure reading consists of a higher number over a lower number. Ideally, your blood pressure should be below 120/80. The first ("top") number is called the systolic pressure. It is a measure of the pressure in your arteries as your heart beats. The second ("bottom") number is called the diastolic pressure. It is a measure of the pressure in your arteries as the heart relaxes. What are the causes? The cause of this condition is not known. What increases the risk? Some risk factors for high blood pressure are under your control. Others are not. Factors you  can change  Smoking.  Having type 2 diabetes mellitus, high cholesterol, or both.  Not getting enough exercise or physical activity.  Being overweight.  Having too much fat, sugar, calories, or salt (sodium) in your diet.  Drinking too much alcohol. Factors that are difficult or impossible to change  Having chronic kidney disease.  Having a family history of high blood pressure.  Age. Risk increases with age.  Race. You may be at higher risk if you are African-American.  Gender. Men are at higher risk than women before age 51. After age 165, women are at higher risk than men.  Having obstructive sleep apnea.  Stress. What are the signs or symptoms? Extremely high blood pressure (hypertensive crisis) may cause:  Headache.  Anxiety.  Shortness of breath.  Nosebleed.  Nausea and vomiting.  Severe chest pain.  Jerky movements you cannot control (seizures). How is this diagnosed? This condition is diagnosed by measuring your blood pressure while you are seated, with your arm resting on a surface. The cuff of the blood pressure monitor will be placed directly against the skin of your upper arm at the level of your heart. It should be measured at least twice using the same arm. Certain conditions can cause a difference in blood pressure between your right and left arms. Certain factors can cause blood pressure readings to be lower or higher than normal (elevated) for a short period of time:  When your blood pressure is higher when you are in a health care provider's office than when you are at home, this is called white coat hypertension. Most people with this condition do not need medicines.  When your blood pressure is higher at home than when you are in a health care provider's office, this is called masked hypertension. Most people with this condition may need medicines to control blood pressure. If you have a high blood pressure reading during one visit or you have  normal blood pressure with other risk factors:  You may be asked to return on a different day to have your blood pressure checked again.  You may be asked to monitor your blood pressure at home for 1 week or longer. If you are diagnosed with hypertension, you may have other blood or imaging tests to help your health care provider understand your overall risk for other conditions. How is this treated? This condition is treated by making healthy lifestyle changes, such as eating healthy foods, exercising more, and reducing your alcohol intake. Your health care provider may prescribe medicine if lifestyle changes are not enough to get your blood pressure under control, and if:  Your systolic blood pressure is above 130.  Your diastolic blood pressure is above 80.  Your personal target blood pressure may vary depending on your medical conditions, your age, and other factors. Follow these instructions at home: Eating and drinking   Eat a diet that is high in fiber and potassium, and low in sodium, added sugar, and fat. An example eating plan is called the DASH (Dietary Approaches to Stop Hypertension) diet. To eat this way: ? Eat plenty of fresh fruits and vegetables. Try to fill half of your plate at each meal with fruits and vegetables. ? Eat whole grains, such as whole wheat pasta, brown rice, or whole grain bread. Fill about one quarter of your plate with whole grains. ? Eat or drink low-fat dairy products, such as skim milk or low-fat yogurt. ? Avoid fatty cuts of meat, processed or cured meats, and poultry with skin. Fill about one quarter of your plate with lean proteins, such as fish, chicken without skin, beans, eggs, and tofu. ? Avoid premade and processed foods. These tend to be higher in sodium, added sugar, and fat.  Reduce your daily sodium intake. Most people with hypertension should eat less than 1,500 mg of sodium a day.  Limit alcohol intake to no more than 1 drink a day for  nonpregnant women and 2 drinks a day for men. One drink equals 12 oz of beer, 5 oz of wine, or 1 oz of hard liquor. Lifestyle   Work with your health care provider to maintain a healthy body weight or to lose weight. Ask what an ideal weight is for you.  Get at least 30 minutes of exercise that causes your heart to beat faster (aerobic exercise) most days of the week. Activities may include walking, swimming, or biking.  Include exercise to strengthen your muscles (resistance exercise), such as pilates or lifting weights, as part of your weekly exercise routine. Try to do these types of exercises for 30 minutes at least 3 days a week.  Do not use any products that contain nicotine or tobacco, such as cigarettes and e-cigarettes. If you need help quitting, ask your health care provider.  Monitor your blood pressure at home as told by your health care provider.  Keep all follow-up visits as told by your health care provider. This is important. Medicines  Take over-the-counter and prescription medicines only as told by your health care provider. Follow directions carefully. Blood pressure medicines must be taken as prescribed.  Do not skip doses of blood pressure medicine. Doing this puts you at risk for problems and can make the medicine less effective.  Ask your health care provider about side effects or reactions to medicines that you should watch for. Contact a health care provider if:  You think you are having a reaction to a medicine you are taking.  You have headaches that keep coming back (recurring).  You feel dizzy.  You have swelling in your ankles.  You have trouble with your vision. Get help right away if:  You develop a severe headache or confusion.  You have unusual weakness or numbness.  You feel faint.  You have severe pain in your chest or abdomen.  You vomit repeatedly.  You have trouble breathing. Summary  Hypertension is when the force of blood  pumping through your arteries is too strong. If this condition is not controlled, it may put you at risk for serious complications.  Your personal target blood pressure may vary depending on your medical conditions, your age, and other factors. For most people, a normal blood pressure is  less than 120/80.  Hypertension is treated with lifestyle changes, medicines, or a combination of both. Lifestyle changes include weight loss, eating a healthy, low-sodium diet, exercising more, and limiting alcohol. This information is not intended to replace advice given to you by your health care provider. Make sure you discuss any questions you have with your health care provider. Document Released: 11/20/2005 Document Revised: 10/18/2016 Document Reviewed: 10/18/2016 Elsevier Interactive Patient Education  2019 [employer]

## 2019-05-20 LAB — LIPID PANEL
Chol/HDL Ratio: 5.7 ratio — ABNORMAL HIGH (ref 0.0–4.4)
Cholesterol, Total: 200 mg/dL — ABNORMAL HIGH (ref 100–199)
HDL: 35 mg/dL — ABNORMAL LOW
LDL Calculated: 135 mg/dL — ABNORMAL HIGH (ref 0–99)
Triglycerides: 148 mg/dL (ref 0–149)
VLDL Cholesterol Cal: 30 mg/dL (ref 5–40)

## 2019-05-20 LAB — HEMOGLOBIN A1C
Est. average glucose Bld gHb Est-mCnc: 214 mg/dL
Hgb A1c MFr Bld: 9.1 % — ABNORMAL HIGH (ref 4.8–5.6)

## 2019-05-21 ENCOUNTER — Observation Stay (HOSPITAL_COMMUNITY): Payer: Self-pay

## 2019-05-21 ENCOUNTER — Observation Stay (HOSPITAL_COMMUNITY)
Admission: EM | Admit: 2019-05-21 | Discharge: 2019-05-22 | Disposition: A | Discharge status: Home / Self Care | Payer: Self-pay | Attending: Internal Medicine | Admitting: Internal Medicine

## 2019-05-21 ENCOUNTER — Other Ambulatory Visit: Payer: Self-pay

## 2019-05-21 ENCOUNTER — Encounter (HOSPITAL_COMMUNITY): Payer: Self-pay | Admitting: Emergency Medicine

## 2019-05-21 ENCOUNTER — Other Ambulatory Visit (INDEPENDENT_AMBULATORY_CARE_PROVIDER_SITE_OTHER): Payer: Self-pay | Admitting: Primary Care

## 2019-05-21 ENCOUNTER — Emergency Department (HOSPITAL_COMMUNITY): Payer: Self-pay

## 2019-05-21 DIAGNOSIS — E1159 Type 2 diabetes mellitus with other circulatory complications: Secondary | ICD-10-CM | POA: Diagnosis present

## 2019-05-21 DIAGNOSIS — Z7902 Long term (current) use of antithrombotics/antiplatelets: Secondary | ICD-10-CM | POA: Insufficient documentation

## 2019-05-21 DIAGNOSIS — I152 Hypertension secondary to endocrine disorders: Secondary | ICD-10-CM | POA: Diagnosis present

## 2019-05-21 DIAGNOSIS — E1169 Type 2 diabetes mellitus with other specified complication: Secondary | ICD-10-CM | POA: Diagnosis present

## 2019-05-21 DIAGNOSIS — Z8673 Personal history of transient ischemic attack (TIA), and cerebral infarction without residual deficits: Secondary | ICD-10-CM | POA: Insufficient documentation

## 2019-05-21 DIAGNOSIS — R519 Headache, unspecified: Secondary | ICD-10-CM | POA: Diagnosis present

## 2019-05-21 DIAGNOSIS — Z20828 Contact with and (suspected) exposure to other viral communicable diseases: Secondary | ICD-10-CM | POA: Insufficient documentation

## 2019-05-21 DIAGNOSIS — I10 Essential (primary) hypertension: Secondary | ICD-10-CM | POA: Insufficient documentation

## 2019-05-21 DIAGNOSIS — Z95818 Presence of other cardiac implants and grafts: Secondary | ICD-10-CM | POA: Insufficient documentation

## 2019-05-21 DIAGNOSIS — H532 Diplopia: Principal | ICD-10-CM | POA: Diagnosis present

## 2019-05-21 DIAGNOSIS — Z79899 Other long term (current) drug therapy: Secondary | ICD-10-CM | POA: Insufficient documentation

## 2019-05-21 DIAGNOSIS — F17210 Nicotine dependence, cigarettes, uncomplicated: Secondary | ICD-10-CM | POA: Insufficient documentation

## 2019-05-21 DIAGNOSIS — G459 Transient cerebral ischemic attack, unspecified: Secondary | ICD-10-CM | POA: Diagnosis present

## 2019-05-21 DIAGNOSIS — R51 Headache: Secondary | ICD-10-CM

## 2019-05-21 DIAGNOSIS — Z862 Personal history of diseases of the blood and blood-forming organs and certain disorders involving the immune mechanism: Secondary | ICD-10-CM | POA: Insufficient documentation

## 2019-05-21 DIAGNOSIS — E785 Hyperlipidemia, unspecified: Secondary | ICD-10-CM | POA: Insufficient documentation

## 2019-05-21 DIAGNOSIS — E119 Type 2 diabetes mellitus without complications: Secondary | ICD-10-CM

## 2019-05-21 DIAGNOSIS — Z794 Long term (current) use of insulin: Secondary | ICD-10-CM | POA: Insufficient documentation

## 2019-05-21 LAB — POCT I-STAT, CHEM 8
BUN: 13 mg/dL (ref 6–20)
Calcium, Ion: 1.17 mmol/L (ref 1.15–1.40)
Chloride: 109 mmol/L (ref 98–111)
Creatinine, Ser: 0.5 mg/dL (ref 0.44–1.00)
Glucose, Bld: 100 mg/dL — ABNORMAL HIGH (ref 70–99)
HCT: 42 % (ref 36.0–46.0)
Hemoglobin: 14.3 g/dL (ref 12.0–15.0)
Potassium: 3.3 mmol/L — ABNORMAL LOW (ref 3.5–5.1)
Sodium: 140 mmol/L (ref 135–145)
TCO2: 22 mmol/L (ref 22–32)

## 2019-05-21 LAB — COMPREHENSIVE METABOLIC PANEL
ALT: 15 U/L (ref 0–44)
AST: 13 U/L — ABNORMAL LOW (ref 15–41)
Albumin: 4.2 g/dL (ref 3.5–5.0)
Alkaline Phosphatase: 54 U/L (ref 38–126)
Anion gap: 8 (ref 5–15)
BUN: 14 mg/dL (ref 6–20)
CO2: 23 mmol/L (ref 22–32)
Calcium: 9.5 mg/dL (ref 8.9–10.3)
Chloride: 104 mmol/L (ref 98–111)
Creatinine, Ser: 0.63 mg/dL (ref 0.44–1.00)
GFR calc Af Amer: >60 mL/min (ref >60)
GFR calc non Af Amer: >60 mL/min (ref >60)
Glucose, Bld: 113 mg/dL — ABNORMAL HIGH (ref 70–99)
Potassium: 3.6 mmol/L (ref 3.5–5.1)
Sodium: 135 mmol/L (ref 135–145)
Total Bilirubin: 0.5 mg/dL (ref 0.3–1.2)
Total Protein: 7.5 g/dL (ref 6.5–8.1)

## 2019-05-21 LAB — PROTIME-INR
INR: 0.9 (ref 0.8–1.2)
Prothrombin Time: 12.3 seconds (ref 11.4–15.2)

## 2019-05-21 LAB — DIFFERENTIAL
Abs Immature Granulocytes: 0.03 10*3/uL (ref 0.00–0.07)
Basophils Absolute: 0 10*3/uL (ref 0.0–0.1)
Basophils Relative: 0 %
Eosinophils Absolute: 0.1 10*3/uL (ref 0.0–0.5)
Eosinophils Relative: 1 %
Immature Granulocytes: 0 %
Lymphocytes Relative: 35 %
Lymphs Abs: 3 10*3/uL (ref 0.7–4.0)
Monocytes Absolute: 0.7 10*3/uL (ref 0.1–1.0)
Monocytes Relative: 8 %
Neutro Abs: 4.8 10*3/uL (ref 1.7–7.7)
Neutrophils Relative %: 56 %

## 2019-05-21 LAB — GLUCOSE, CAPILLARY: Glucose-Capillary: 114 mg/dL — ABNORMAL HIGH (ref 70–99)

## 2019-05-21 LAB — CBC
HCT: 47.3 % — ABNORMAL HIGH (ref 36.0–46.0)
Hemoglobin: 15.9 g/dL — ABNORMAL HIGH (ref 12.0–15.0)
MCH: 32.4 pg (ref 26.0–34.0)
MCHC: 33.6 g/dL (ref 30.0–36.0)
MCV: 96.5 fL (ref 80.0–100.0)
Platelets: 340 10*3/uL (ref 150–400)
RBC: 4.9 MIL/uL (ref 3.87–5.11)
RDW: 13.6 % (ref 11.5–15.5)
WBC: 8.6 10*3/uL (ref 4.0–10.5)
nRBC: 0 % (ref 0.0–0.2)

## 2019-05-21 LAB — APTT: aPTT: 28 seconds (ref 24–36)

## 2019-05-21 LAB — TSH: TSH: 2.53 u[IU]/mL (ref 0.350–4.500)

## 2019-05-21 LAB — ETHANOL: Alcohol, Ethyl (B): <10 mg/dL (ref <10)

## 2019-05-21 LAB — CBG MONITORING, ED: Glucose-Capillary: 123 mg/dL — ABNORMAL HIGH (ref 70–99)

## 2019-05-21 MED ORDER — ASPIRIN 300 MG RE SUPP: 300.0000 mg | Freq: Every day | RECTAL | Status: DC

## 2019-05-21 MED ORDER — ACETAMINOPHEN 160 MG/5ML PO SOLN: 650.0000 mg | ORAL | Status: DC | PRN

## 2019-05-21 MED ORDER — ACETAMINOPHEN 650 MG RE SUPP: 650.0000 mg | RECTAL | Status: DC | PRN

## 2019-05-21 MED ORDER — ACETAMINOPHEN 325 MG PO TABS: 650.0000 mg | ORAL_TABLET | ORAL | Status: DC | PRN

## 2019-05-21 MED ORDER — ASPIRIN 325 MG PO TABS
325.0000 mg | ORAL_TABLET | Freq: Every day | ORAL | Status: DC
  Filled 2019-05-21: qty 1

## 2019-05-21 MED ORDER — STROKE: EARLY STAGES OF RECOVERY BOOK
Freq: Once | Status: AC
  Administered 2019-05-22
  Filled 2019-05-21: qty 1

## 2019-05-21 MED ORDER — ATORVASTATIN CALCIUM 40 MG PO TABS: 40.0000 mg | ORAL_TABLET | Freq: Every day | ORAL | 5 refills | Status: DC

## 2019-05-21 MED ORDER — KETOROLAC TROMETHAMINE 30 MG/ML IJ SOLN
30.0000 mg | Freq: Once | INTRAMUSCULAR | Status: AC
  Administered 2019-05-21: 30 mg via INTRAVENOUS
  Filled 2019-05-21: qty 1

## 2019-05-21 MED FILL — ATORVASTATIN CALCIUM 40 MG: 40 | 30 days supply | Qty: 30 | Fill #0

## 2019-05-21 NOTE — ED Triage Notes (Signed)
Per EMS. Pt c/o headache today this am. Took med and went away but came back today and seeing double. Pt ambulatory. Pt has been taking her bp meds and helps her pain. 170/90 bp with ems. cbg 104

## 2019-05-21 NOTE — Progress Notes (Signed)
CODE STROKE DOCUMENTATION CALL TIME 20:09 BEEPER TIME 2012 EXAM STARTED 2016 EXAM FINISHED 2018 IMAGES SENT TO [place] CALLED 2020

## 2019-05-21 NOTE — Consult Note (Signed)
[facility] TeleNeurology Consult Services   Date of Service:   05/21/2019 20:31:30  Impression:     .  Rule Out Acute Ischemic Stroke  Comments/Sign-Out: The patient needs to be admitted for brain MRI, carotids and echo, to rule out a stroke. She needs antiplatelet and statin combination if no contraindications.  Metrics: Last Known Well: 05/21/2019 18:00:02 [facility] Notification Time: 05/21/2019 20:31:30 Arrival Time: 05/21/2019 19:46:00 Stamp Time: 05/21/2019 20:31:30 Time First Login Attempt: 05/21/2019 20:36:59 Video Start Time: 05/21/2019 20:36:59  Symptoms: The headaches and double vision x2 episodes since this morning NIHSS Start Assessment Time: 05/21/2019 20:40:06 Patient is not a candidate for tPA. Patient was not deemed candidate for tPA thrombolytics because of nihss-0.nonfocal exam . Video End Time: 05/21/2019 20:52:45  CT head showed no acute hemorrhage or acute core infarct.  Clinical Presentation is not Suggestive of Large Vessel Occlusive Disease  Our recommendations are outlined below.  Recommendations:     .  Activate Stroke Protocol Admission/Order Set     .  Stroke/Telemetry Floor     .  Neuro Checks     .  Bedside Swallow Eval     .  DVT Prophylaxis     .  IV Fluids, Normal Saline     .  Head of Bed 30 Degrees     .  Euglycemia and Avoid Hyperthermia (PRN Acetaminophen)     .  Antiplatelet Therapy Recommended  Routine Consultation with [facility] for Follow up Care  Sign Out:     .  Discussed with Emergency Department Provider    ------------------------------------------------------------------------------  History of Present Illness: Patient is a 53 year old Female.  Patient was brought by EMS for symptoms of The headaches and double vision x2 episodes since this morning  53 year old female with past medical history significant for stroke in 2017 which is left of it baseline diplopia, diabetes, hypertension on Plavix.  Her first episode occurred at about 9 o'clock this morning when she developed a headache which was described as moderate in intensity and associated with worsening of her baseline dementia. It lasted for about two hours and resolved after she took her blood pressure pills and Tylenol. However, the symptoms record at about 6 PM today Still ongoing. Her NIH stroke scale is zero. CAT scan is negative for bleed. She needs to be admitted for further workup with MRI brain, carotids and echocardiogram. She will benefit with of consultation. She is not a TPA candidate as an examination is otherwise nonfocal clinically, she does not exhibit any signs of large vessel occlusion.  Last seen normal was within 4.5 hours. There is no history of hemorrhagic complications or intracranial hemorrhage. There is no history of Recent Anticoagulants. There is no history of recent major surgery. There is no history of recent stroke.  Examination: 1A: Level of Consciousness - Alert; keenly responsive + 0 1B: Ask Month and Age - Both Questions Right + 0 1C: Blink Eyes & Squeeze Hands - Performs Both Tasks + 0 2: Test Horizontal Extraocular Movements - Normal + 0 3: Test Visual Fields - No Visual Loss + 0 4: Test Facial Palsy (Use Grimace if Obtunded) - Normal symmetry + 0 5A: Test Left Arm Motor Drift - No Drift for 10 Seconds + 0 5B: Test Right Arm Motor Drift - No Drift for 10 Seconds + 0 6A: Test Left Leg Motor Drift - No Drift for 5 Seconds + 0 6B: Test Right Leg Motor Drift - No Drift for 5  Seconds + 0 7: Test Limb Ataxia (FNF/Heel-Shin) - No Ataxia + 0 8: Test Sensation - Normal; No sensory loss + 0 9: Test Language/Aphasia - Normal; No aphasia + 0 10: Test Dysarthria - Normal + 0 11: Test Extinction/Inattention - No abnormality + 0  NIHSS Score: 0  Patient/Family was informed the Neurology Consult would happen via TeleHealth consult by way of interactive audio and video telecommunications and consented to  receiving care in this manner.  Due to the immediate potential for life-threatening deterioration due to underlying acute neurologic illness, I spent 35 minutes providing critical care. This time includes time for face to face visit via telemedicine, review of medical records, imaging studies and discussion of findings with providers, the patient and/or family.   Dr Durene RomansShubhangi Erinn Huskins   [facility] [phone]   Case [id number]

## 2019-05-21 NOTE — H&P (Addendum)
TRH H&P    Patient Demographics:    Tammy Bauer, is a 53 y.o. female  MRN: [id number]  DOB - [birth year]  Admit Date - 05/21/2019  Referring MD/NP/PA: Denton Meek  Outpatient Primary MD for the patient is Kerin Perna, NP  Patient coming from:  home  Chief complaint- headache, double vision   HPI:    Tammy Bauer  is a 53 y.o. 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  Pt states that her symptoms were similar to her prior CVA.  Pt states has been taking plavix.  No aspirin.  Pt denies syncope, cp, palp, sob, n/v, abd pain, diarrhea, brbpr, dysuria/ hematuria.   In ED,  T 98 P 77 R 19  Bp 154/89=> 128/78  Pox 98% Wt 67.3kg  Wbc 8.6, Hgb 14.3, Plt 340 INR 0.9 Na 140, K 3.3 Bun 13, Creatinine 0.5 Glucose 100  CT brain IMPRESSION: 1. No acute intracranial abnormality identified. 2. Stable small chronic infarctions within the left temporal lobe and left frontal lobe. Stable chronic microvascular ischemic changes of the brain. 3. ASPECTS is 10.  CXR IMPRESSION: No acute chest findings.  Pt will be admitted for w/up of double vision, headache r/o CVA, and treatment of hypokalemia.      Review of systems:    In addition to the HPI above,  No Fever-chills, No changes with  hearing, No problems swallowing food or Liquids, No Chest pain, Cough or Shortness of Breath, No Abdominal pain, No Nausea or Vomiting, bowel movements are regular, No Blood in stool or Urine, No dysuria, No new skin rashes or bruises, No new joints pains-aches,  No new weakness, tingling, numbness in any extremity, No recent weight gain or loss, No polyuria, polydypsia or polyphagia, No  significant Mental Stressors.  All other systems reviewed and are negative.    Past History of the following :    Past Medical History:  Diagnosis Date   Anemia    Diabetes mellitus without complication ([place])    Hypertension    Stroke [facility])    x2; no deficits   Thyroid nodule 10/02/2017      Past Surgical History:  Procedure Laterality Date   ENDOMETRIAL ABLATION N/A 01/30/2018   Procedure: ENDOMETRIAL ABLATION WITH MINERVA;  Surgeon: Florian Buff, MD;  Location: [facility];  Service: Gynecology;  Laterality: N/A;   HYSTEROSCOPY W/D&C N/A 01/30/2018   Procedure: DILATATION AND CURETTAGE /HYSTEROSCOPY;  Surgeon: Florian Buff, MD;  Location: [facility];  Service: Gynecology;  Laterality: N/A;   LOOP RECORDER INSERTION N/A 06/27/2017   Procedure: Loop Recorder Insertion;  Surgeon: Constance Haw, MD;  Location: [place];  Service: Cardiovascular;  Laterality: N/A;   TEE WITHOUT CARDIOVERSION N/A 06/27/2017   Procedure: TRANSESOPHAGEAL ECHOCARDIOGRAM (TEE);  Surgeon: Pixie Casino, MD;  Location: [facility];  Service:  Cardiovascular;  Laterality: N/A;      Social History:      Social History   Tobacco Use   Smoking status: Current Every Day Smoker    Packs/day: 0.25    Years: 20.00    Pack years: 5.00    Types: Cigarettes    Start date: 08/29/1999   Smokeless tobacco: Never Used   Tobacco comment: smoke 5 cigarettes a day  Substance Use Topics   Alcohol use: No       Family History :     Family History  Problem Relation Age of Onset   Stroke Maternal Grandfather        Home Medications:   Prior to Admission medications   Medication Sig Start Date End Date Taking? Authorizing Provider  acetaminophen (TYLENOL) 325 MG tablet Take 650 mg by mouth every 6 (six) hours as needed for mild pain or moderate pain.    Yes [provider]  amLODipine (NORVASC) 10 MG tablet Take 1 tablet (10 mg total) by mouth daily. TAKE ONE TABLET BY  MOUTH ONCE DAILY Patient taking differently: Take 10 mg by mouth daily.  02/20/19  Yes Grayce Sessions, NP  atorvastatin (LIPITOR) 40 MG tablet Take 1 tablet (40 mg total) by mouth at bedtime. Any generic statin from [employer] list is okay 05/21/19  Yes Grayce Sessions, NP  carvedilol (COREG) 6.25 MG tablet Take 1 tablet (6.25 mg total) by mouth 2 (two) times daily with a meal. 02/20/19  Yes Grayce Sessions, NP  clopidogrel (PLAVIX) 75 MG tablet Take 1 tablet (75 mg total) by mouth daily. 02/20/19  Yes Grayce Sessions, NP  insulin glargine (LANTUS) 100 UNIT/ML injection Inject 0.4 mLs (40 Units total) into the skin daily. Patient taking differently: Inject 10-30 Units into the skin at bedtime. Based on blood sugar levels patient injects 25 units 10-25 units for 200 or below and gives 30 units for levels over 200. 02/20/19  Yes Grayce Sessions, NP  isosorbide mononitrate (IMDUR) 30 MG 24 hr tablet Take 1 tablet (30 mg total) by mouth daily. 02/20/19  Yes Grayce Sessions, NP  losartan (COZAAR) 100 MG tablet Take 1 tablet (100 mg total) by mouth daily. 02/20/19  Yes Grayce Sessions, NP  diclofenac sodium (VOLTAREN) 1 % GEL Apply 2 g topically 4 (four) times daily. Patient not taking: Reported on 05/21/2019 02/20/19   Gwyneth Sprout, MD  hydrALAZINE (APRESOLINE) 50 MG tablet Take 1 tablet (50 mg total) by mouth 3 (three) times daily for 30 days. 05/19/19 06/18/19  Grayce Sessions, NP  INSULIN SYRINGE 1CC/29G (B-D INSULIN SYRINGE) 29G X 1/2" 1 ML MISC 1 application by Does not apply route at bedtime. Patient not taking: Reported on 05/21/2019 02/20/19   Grayce Sessions, NP     Allergies:     Allergies  Allergen Reactions   Ace Inhibitors Cough     Physical Exam:   Vitals  Blood pressure 128/78, pulse 77, temperature 98 F (36.7 C), temperature source Oral, resp. rate 19, height  (1.575 m), weight 67.3 kg, SpO2 98 %.  1.  General: Aoxo3,   2.  Psychiatric: euthymic  3. Neurologic: Speech fluent,  cn2-12 intact, reflexes 2+ symmetric, diffuse with no clonus,  Motor 5/5 in all 4 ext  4. HEENMT:  Anicteric, pupils 1.8mm symmetric, direct, consensual, near intact Tongue midline Neck: no jvd, no bruit  5. Respiratory : CTAB  6. Cardiovascular : rrr s1, s2,  1/6  sem llsb  7. Gastrointestinal:  Abd: soft, nt, nd, +bs  8. Skin:  Ext: no c/c/e,  No rash  9.Musculoskeletal:  Good ROM  No adenopathy    Data Review:    CBC Recent Labs  Lab 05/21/19 2016 05/21/19 2055  WBC 8.6  --   HGB 15.9* 14.3  HCT 47.3* 42.0  PLT 340  --   MCV 96.5  --   MCH 32.4  --   MCHC 33.6  --   RDW 13.6  --   LYMPHSABS 3.0  --   MONOABS 0.7  --   EOSABS 0.1  --   BASOSABS 0.0  --    ------------------------------------------------------------------------------------------------------------------  Results for orders placed or performed during the hospital encounter of 05/21/19 (from the past 48 hour(s))  CBG monitoring, ED     Status: Abnormal   Collection Time: 05/21/19  8:15 PM  Result Value Ref Range   Glucose-Capillary 123 (H) 70 - 99 mg/dL  Ethanol     Status: None   Collection Time: 05/21/19  8:16 PM  Result Value Ref Range   Alcohol, Ethyl (B) <10 <10 mg/dL    Comment: (NOTE) Lowest detectable limit for serum alcohol is 10 mg/dL. For medical purposes only. Performed at [facility], [address] [place], [place] [address]   Protime-INR     Status: None   Collection Time: 05/21/19  8:16 PM  Result Value Ref Range   Prothrombin Time 12.3 11.4 - 15.2 seconds   INR 0.9 0.8 - 1.2    Comment: (NOTE) INR goal varies based on device and disease states. Performed at [facility], [address] [place], [place] [address]   APTT     Status: None   Collection Time: 05/21/19  8:16 PM  Result Value Ref Range   aPTT 28 24 - 36 seconds    Comment: Performed at [facility], [address] [place], [place] [address]   CBC     Status: Abnormal   Collection Time: 05/21/19  8:16 PM  Result Value Ref Range   WBC 8.6 4.0 - 10.5 K/uL   RBC 4.90 3.87 - 5.11 MIL/uL   Hemoglobin 15.9 (H) 12.0 - 15.0 g/dL   HCT 57.847.3 (H) 46.936.0 - 62.946.0 %   MCV 96.5 80.0 - 100.0 fL   MCH 32.4 26.0 - 34.0 pg   MCHC 33.6 30.0 - 36.0 g/dL   RDW 52.813.6 41.311.5 - 24.415.5 %   Platelets 340 150 - 400 K/uL   nRBC 0.0 0.0 - 0.2 %    Comment: Performed at [facility], [address] [place], [place] [address]  Differential     Status: None   Collection Time: 05/21/19  8:16 PM  Result Value Ref Range   Neutrophils Relative % 56 %   Neutro Abs 4.8 1.7 - 7.7 K/uL   Lymphocytes Relative 35 %   Lymphs Abs 3.0 0.7 - 4.0 K/uL   Monocytes Relative 8 %   Monocytes Absolute 0.7 0.1 - 1.0 K/uL   Eosinophils Relative 1 %   Eosinophils Absolute 0.1 0.0 - 0.5 K/uL   Basophils Relative 0 %   Basophils Absolute 0.0 0.0 - 0.1 K/uL   Immature Granulocytes 0 %   Abs Immature Granulocytes 0.03 0.00 - 0.07 K/uL    Comment: Performed at [facility], [address] [place], [place] [address]  Comprehensive metabolic panel     Status: Abnormal   Collection Time: 05/21/19  8:16 PM  Result  Value Ref Range   Sodium 135 135 - 145 mmol/L   Potassium 3.6 3.5 - 5.1 mmol/L   Chloride 104 98 - 111 mmol/L   CO2 23 22 - 32 mmol/L   Glucose, Bld 113 (H) 70 - 99 mg/dL   BUN 14 6 - 20 mg/dL   Creatinine, Ser 4.09 0.44 - 1.00 mg/dL   Calcium 9.5 8.9 - 81.1 mg/dL   Total Protein 7.5 6.5 - 8.1 g/dL   Albumin 4.2 3.5 - 5.0 g/dL   AST 13 (L) 15 - 41 U/L   ALT 15 0 - 44 U/L   Alkaline Phosphatase 54 38 - 126 U/L   Total Bilirubin 0.5 0.3 - 1.2 mg/dL   GFR calc non Af Amer >60 >60 mL/min   GFR calc Af Amer >60 >60 mL/min   Anion gap 8 5 - 15    Comment: Performed at [facility], [address], [place], [place] [address]  TSH     Status: None   Collection Time: 05/21/19  8:16 PM  Result Value Ref Range   TSH 2.530 0.350 - 4.500 uIU/mL    Comment: Performed by a 3rd  Generation assay with a functional sensitivity of <=0.01 uIU/mL. Performed at [facility], [address] [place], [place] [address]   I-STAT, [place] 8     Status: Abnormal   Collection Time: 05/21/19  8:55 PM  Result Value Ref Range   Sodium 140 135 - 145 mmol/L   Potassium 3.3 (L) 3.5 - 5.1 mmol/L   Chloride 109 98 - 111 mmol/L   BUN 13 6 - 20 mg/dL   Creatinine, Ser 1.30 0.44 - 1.00 mg/dL   Glucose, Bld 865 (H) 70 - 99 mg/dL   Calcium, Ion 7.84 6.96 - 1.40 mmol/L   TCO2 22 22 - 32 mmol/L   Hemoglobin 14.3 12.0 - 15.0 g/dL   HCT 29.5 28.4 - 13.2 %  SARS Coronavirus 2 (CEPHEID - Performed in [facility] hospital lab), Hosp Order     Status: None   Collection Time: 05/21/19 10:34 PM   Specimen: Nasopharyngeal Swab  Result Value Ref Range   SARS Coronavirus 2 NEGATIVE NEGATIVE    Comment: (NOTE) If result is NEGATIVE SARS-CoV-2 target nucleic acids are NOT DETECTED. The SARS-CoV-2 RNA is generally detectable in upper and lower  respiratory specimens during the acute phase of infection. The lowest  concentration of SARS-CoV-2 viral copies this assay can detect is 250  copies / mL. A negative result does not preclude SARS-CoV-2 infection  and should not be used as the sole basis for treatment or other  patient management decisions.  A negative result may occur with  improper specimen collection / handling, submission of specimen other  than nasopharyngeal swab, presence of viral mutation(s) within the  areas targeted by this assay, and inadequate number of viral copies  (<250 copies / mL). A negative result must be combined with clinical  observations, patient history, and epidemiological information. If result is POSITIVE SARS-CoV-2 target nucleic acids are DETECTED. The SARS-CoV-2 RNA is generally detectable in upper and lower  respiratory specimens dur ing the acute phase of infection.  Positive  results are indicative of active infection with SARS-CoV-2.  Clinical   correlation with patient history and other diagnostic information is  necessary to determine patient infection status.  Positive results do  not rule out bacterial infection or co-infection with other viruses. If result is PRESUMPTIVE POSTIVE SARS-CoV-2 nucleic acids MAY BE PRESENT.  A presumptive positive result was obtained on the submitted specimen  and confirmed on repeat testing.  While 2019 novel coronavirus  (SARS-CoV-2) nucleic acids may be present in the submitted sample  additional confirmatory testing may be necessary for epidemiological  and / or clinical management purposes  to differentiate between  SARS-CoV-2 and other Sarbecovirus currently known to infect humans.  If clinically indicated additional testing with an alternate test  methodology [phone]([id number]) is advised. The SARS-CoV-2 RNA is generally  detectable in upper and lower respiratory sp ecimens during the acute  phase of infection. The expected result is Negative. Fact Sheet for Patients:  BoilerBrush.com.cyhttps://www.fda.gov/media/136312/download Fact Sheet for Healthcare Providers: https://pope.com/https://www.fda.gov/media/136313/download This test is not yet approved or cleared by the [place] FDA and has been authorized for detection and/or diagnosis of SARS-CoV-2 by FDA under an Emergency Use Authorization (EUA).  This EUA will remain in effect (meaning this test can be used) for the duration of the COVID-19 declaration under Section 564(b)(1) of the Act, 21 U.S.C. section 360bbb-3(b)(1), unless the authorization is terminated or revoked sooner. Performed at [facility], [address] [place], [place] [address]   Glucose, capillary     Status: Abnormal   Collection Time: 05/21/19 11:41 PM  Result Value Ref Range   Glucose-Capillary 114 (H) 70 - 99 mg/dL    Chemistries  Recent Labs  Lab 05/21/19 2016 05/21/19 2055  NA 135 140  K 3.6 3.3*  CL 104 109  CO2 23  --   GLUCOSE 113* 100*  BUN 14 13  CREATININE 0.63 0.50   CALCIUM 9.5  --   AST 13*  --   ALT 15  --   ALKPHOS 54  --   BILITOT 0.5  --    ------------------------------------------------------------------------------------------------------------------  ------------------------------------------------------------------------------------------------------------------ GFR: Estimated Creatinine Clearance: 74.9 mL/min (by C-G formula based on SCr of 0.5 mg/dL). Liver Function Tests: Recent Labs  Lab 05/21/19 2016  AST 13*  ALT 15  ALKPHOS 54  BILITOT 0.5  PROT 7.5  ALBUMIN 4.2   No results for input(s): LIPASE, AMYLASE in the last 168 hours. No results for input(s): AMMONIA in the last 168 hours. Coagulation Profile: Recent Labs  Lab 05/21/19 2016  INR 0.9   Cardiac Enzymes: No results for input(s): CKTOTAL, CKMB, CKMBINDEX, TROPONINI in the last 168 hours. BNP (last 3 results) No results for input(s): PROBNP in the last 8760 hours. HbA1C: Recent Labs    05/19/19 0937  HGBA1C 9.1*   CBG: Recent Labs  Lab 05/21/19 2015 05/21/19 2341  GLUCAP 123* 114*   Lipid Profile: Recent Labs    05/19/19 0937  CHOL 200*  HDL 35*  LDLCALC 135*  TRIG 148  CHOLHDL 5.7*   Thyroid Function Tests: Recent Labs    05/21/19 2016  TSH 2.530   Anemia Panel: No results for input(s): VITAMINB12, FOLATE, FERRITIN, TIBC, IRON, RETICCTPCT in the last 72 hours.  --------------------------------------------------------------------------------------------------------------- Urine analysis:    Component Value Date/Time   COLORURINE RED (A) 01/25/2018 1255   APPEARANCEUR CLOUDY (A) 01/25/2018 1255   LABSPEC 1.025 01/25/2018 1255   PHURINE 5.0 01/25/2018 1255   GLUCOSEU NEGATIVE 01/25/2018 1255   HGBUR LARGE (A) 01/25/2018 1255   BILIRUBINUR SMALL (A) 01/25/2018 1255   KETONESUR 15 (A) 01/25/2018 1255   PROTEINUR >300 (A) 01/25/2018 1255   UROBILINOGEN 1.0 05/22/2015 2045   NITRITE POSITIVE (A) 01/25/2018 1255   LEUKOCYTESUR SMALL  (A) 01/25/2018 1255      Imaging Results:    Dg  Chest 2 View  Result Date: 05/21/2019 CLINICAL DATA:  Double vision. EXAM: CHEST - 2 VIEW COMPARISON:  02/20/2019 FINDINGS: Implanted loop recorder again projects over the left chest wall.The cardiomediastinal contours are normal. The lungs are clear. Pulmonary vasculature is normal. No consolidation, pleural effusion, or pneumothorax. No acute osseous abnormalities are seen. IMPRESSION: No acute chest findings. Electronically Signed   By: Narda RutherfordMelanie  Sanford M.D.   On: 05/21/2019 23:38   Ct Head Code Stroke Wo Contrast  Result Date: 05/21/2019 CLINICAL DATA:  Code stroke. 53 y/o F; headache and seeing double. History of stroke. EXAM: CT HEAD WITHOUT CONTRAST TECHNIQUE: Contiguous axial images were obtained from the base of the skull through the vertex without intravenous contrast. COMPARISON:  10/02/2017 CT head. FINDINGS: Brain: Stable small chronic infarctions within the left superior temporal lobe and the left lateral frontal subcortical white matter. Stable nonspecific white matter hypodensities compatible with chronic microvascular ischemic changes. No findings of acute stroke, hemorrhage, extra-axial collection, mass effect, hydrocephalus, or herniation. Vascular: Calcific atherosclerosis of carotid siphons. No hyperdense vessel identified. Skull: Normal. Negative for fracture or focal lesion. Sinuses/Orbits: No acute finding. Other: None. ASPECTS [facility]([place] Stroke Program Early CT Score) - Ganglionic level infarction (caudate, lentiform nuclei, internal capsule, insula, M1-M3 cortex): 7 - Supraganglionic infarction (M4-M6 cortex): 3 Total score (0-10 with 10 being normal): 10 IMPRESSION: 1. No acute intracranial abnormality identified. 2. Stable small chronic infarctions within the left temporal lobe and left frontal lobe. Stable chronic microvascular ischemic changes of the brain. 3. ASPECTS is 10. These results were called by telephone at the time of  interpretation on 05/21/2019 at 8:35 pm to Dr. Bethann BerkshireJOSEPH ZAMMIT , who verbally acknowledged these results. Electronically Signed   By: Mitzi HansenLance  Furusawa-Stratton M.D.   On: 05/21/2019 20:38   nsr at 70, nl axis, nl pr int, nl qt,  No st-t changes c/w ischemia   Assessment & Plan:    Principal Problem:   Diplopia Active Problems:   Essential hypertension   DM2 (diabetes mellitus, type 2) (HCC)   TIA (transient ischemic attack)   Headache   Hyperlipidemia  Diplopia/ headache  ? TIA/ CVA Tele Check MRI/ MRA brain Check carotid ultrasound Check cardiac echo Recent hga1c=9.1 (05/19/2019) Check lipid Speech therapy/ PT/OT consult Start Aspirin  (risk/ benefit discussed with patient, pt willing to proceed) Cont plavix 75mg  po qday  Cont Lipitor 40mg  po qhs  Neurology consult, appreciate input   Hypertension Cont Losartan 100mg  po qday Cont carvedilol 6.25mg  po bid Cont Imdur 30mg  po qday Cont Amlodipine  Cont Hydralazine 50mg  po tid  Dm2 Hold lantus,  fsbs ac and qhs, ISS  DVT Prophylaxis-   SCDs   AM Labs Ordered, also please review Full Orders  Family Communication: Admission, patients condition and plan of care including tests being ordered have been discussed with the patient  who indicate understanding and agree with the plan and Code Status.  Code Status:  FULL CODE  Admission status: Observation: Based on patients clinical presentation and evaluation of above clinical data, I have made determination that patient meets observation criteria at this time.   Time spent in minutes : 60   Pearson GrippeJames Jennafer Gladue M.D on 05/22/2019 at 12:25 AM

## 2019-05-21 NOTE — ED Triage Notes (Signed)
Pt states pain behind left ear and double vision with last stroke. States pain behind right ear and double vision today started at 0930

## 2019-05-21 NOTE — ED Notes (Signed)
[place] paged @ 2015

## 2019-05-21 NOTE — ED Triage Notes (Addendum)
Per dr zammit, call code stroke. CT aware

## 2019-05-21 NOTE — ED Notes (Signed)
Pt to room from ct 2025

## 2019-05-21 NOTE — ED Provider Notes (Signed)
[facility] EMERGENCY DEPARTMENT Provider Note   CSN: [id number] Arrival date & time: 05/21/19  1946  An emergency department physician performed an initial assessment on this suspected stroke patient at 2014.  History   Chief Complaint Chief Complaint  Patient presents with  . Headache    HPI Tammy Bauer is a 53 y.o. female.     Patient complains of headache and double vision.  Patient states she had a stroke before that she got some double vision headache from  The history is provided by the patient. No language interpreter was used.  Headache Pain location:  Occipital Quality:  Dull Radiates to:  Does not radiate Severity currently:  4/10 Onset quality:  Sudden Timing:  Constant Progression:  Worsening Chronicity:  New Similar to prior headaches: no   Context: not activity   Relieved by:  Nothing Worsened by:  Nothing Ineffective treatments:  None tried Associated symptoms: no abdominal pain, no back pain, no congestion, no cough, no diarrhea, no fatigue, no seizures and no sinus pressure     Past Medical History:  Diagnosis Date  . Anemia   . Diabetes mellitus without complication ([place])   . Hypertension   . Stroke [facility])    x2; no deficits    Patient Active Problem List   Diagnosis Date Noted  . Vaginal bleeding 01/03/2018  . Acute blood loss anemia 01/03/2018  . Anemia 01/03/2018  . Acute ischemic stroke ([place]) 10/02/2017  . DM2 (diabetes mellitus, type 2) ([place]) 10/02/2017  . Acute CVA (cerebrovascular accident) ([place]) 06/25/2017  . Hypertensive emergency 06/25/2017  . Tobacco abuse 06/25/2017  . Essential hypertension 10/07/2015  . Hyperglycemia 10/07/2015    Past Surgical History:  Procedure Laterality Date  . ENDOMETRIAL ABLATION N/A 01/30/2018   Procedure: ENDOMETRIAL ABLATION WITH MINERVA;  Surgeon: Florian Buff, MD;  Location: [facility] OR[facility];  Service: Gynecology;  Laterality: N/A;  . HYSTEROSCOPY W/D&C N/A 01/30/2018   Procedure: DILATATION  AND CURETTAGE /HYSTEROSCOPY;  Surgeon: Florian Buff, MD;  Location: [facility] ORS;  Service: Gynecology;  Laterality: N/A;  . LOOP RECORDER INSERTION N/A 06/27/2017   Procedure: Loop Recorder Insertion;  Surgeon: Constance Haw, MD;  Location: [place] CV LAB;  Service: Cardiovascular;  Laterality: N/A;  . TEE WITHOUT CARDIOVERSION N/A 06/27/2017   Procedure: TRANSESOPHAGEAL ECHOCARDIOGRAM (TEE);  Surgeon: Pixie Casino, MD;  Location: [facility] ENDOSCOPY;  Service: Cardiovascular;  Laterality: N/A;     OB History   No obstetric history on file.      Home Medications    Prior to Admission medications   Medication Sig Start Date End Date Taking? Authorizing Provider  acetaminophen (TYLENOL) 325 MG tablet Take 650 mg by mouth every 6 (six) hours as needed for mild pain or moderate pain.     [provider]  amLODipine (NORVASC) 10 MG tablet Take 1 tablet (10 mg total) by mouth daily. TAKE ONE TABLET BY MOUTH ONCE DAILY 02/20/19   Kerin Perna, NP  atorvastatin (LIPITOR) 40 MG tablet Take 1 tablet (40 mg total) by mouth at bedtime. Any generic statin from [employer] list is okay 05/21/19   Kerin Perna, NP  blood glucose meter kit and supplies KIT Dispense based on patient and insurance preference. Use up to four times daily as directed. (FOR ICD-9 250.00, 250.01). 07/18/17   Clent Demark, PA-C  carvedilol (COREG) 6.25 MG tablet Take 1 tablet (6.25 mg total) by mouth 2 (two) times daily with a meal. 02/20/19  Kerin Perna, NP  clopidogrel (PLAVIX) 75 MG tablet Take 1 tablet (75 mg total) by mouth daily. 02/20/19   Kerin Perna, NP  diclofenac sodium (VOLTAREN) 1 % GEL Apply 2 g topically 4 (four) times daily. 02/20/19   Blanchie Dessert, MD  glucose blood (RELION GLUCOSE TEST STRIPS) test strip Use as instructed 07/18/17   Clent Demark, PA-C  hydrALAZINE (APRESOLINE) 50 MG tablet Take 1 tablet (50 mg total) by mouth 3 (three) times daily for 30 days.  05/19/19 06/18/19  Kerin Perna, NP  insulin glargine (LANTUS) 100 UNIT/ML injection Inject 0.4 mLs (40 Units total) into the skin daily. 02/20/19   Kerin Perna, NP  INSULIN SYRINGE 1CC/29G (B-D INSULIN SYRINGE) 29G X 1/2" 1 ML MISC 1 application by Does not apply route at bedtime. 02/20/19   Kerin Perna, NP  isosorbide mononitrate (IMDUR) 30 MG 24 hr tablet Take 1 tablet (30 mg total) by mouth daily. 02/20/19   Kerin Perna, NP  losartan (COZAAR) 100 MG tablet Take 1 tablet (100 mg total) by mouth daily. 02/20/19   Kerin Perna, NP    Family History Family History  Problem Relation Age of Onset  . Stroke Maternal Grandfather     Social History Social History   Tobacco Use  . Smoking status: Current Every Day Smoker    Packs/day: 0.25    Years: 20.00    Pack years: 5.00    Types: Cigarettes    Start date: 08/29/1999  . Smokeless tobacco: Never Used  . Tobacco comment: smoke 5 cigarettes a day  Substance Use Topics  . Alcohol use: No  . Drug use: No     Allergies   Ace inhibitors   Review of Systems Review of Systems  Constitutional: Negative for appetite change and fatigue.  HENT: Negative for congestion, ear discharge and sinus pressure.   Eyes: Negative for discharge.  Respiratory: Negative for cough.   Cardiovascular: Negative for chest pain.  Gastrointestinal: Negative for abdominal pain and diarrhea.  Genitourinary: Negative for frequency and hematuria.  Musculoskeletal: Negative for back pain.  Skin: Negative for rash.  Neurological: Positive for headaches. Negative for seizures.  Psychiatric/Behavioral: Negative for hallucinations.     Physical Exam Updated Vital Signs BP (!) 153/90   Pulse 71   Temp 98 F (36.7 C) (Oral)   SpO2 99%   Physical Exam Vitals signs and nursing note reviewed.  Constitutional:      Appearance: She is well-developed.  HENT:     Head: Normocephalic.     Nose: Nose normal.  Eyes:      General: No scleral icterus.    Conjunctiva/sclera: Conjunctivae normal.  Neck:     Musculoskeletal: Neck supple.     Thyroid: No thyromegaly.  Cardiovascular:     Rate and Rhythm: Normal rate and regular rhythm.     Heart sounds: No murmur. No friction rub. No gallop.   Pulmonary:     Breath sounds: No stridor. No wheezing or rales.  Chest:     Chest wall: No tenderness.  Abdominal:     General: There is no distension.     Tenderness: There is no abdominal tenderness. There is no rebound.  Musculoskeletal: Normal range of motion.  Lymphadenopathy:     Cervical: No cervical adenopathy.  Skin:    Findings: No erythema or rash.  Neurological:     Mental Status: She is oriented to person, place, and time.  Motor: No abnormal muscle tone.     Coordination: Coordination normal.  Psychiatric:        Behavior: Behavior normal.      ED Treatments / Results  Labs (all labs ordered are listed, but only abnormal results are displayed) Labs Reviewed  CBC - Abnormal; Notable for the following components:      Result Value   Hemoglobin 15.9 (*)    HCT 47.3 (*)    All other components within normal limits  COMPREHENSIVE METABOLIC PANEL - Abnormal; Notable for the following components:   Glucose, Bld 113 (*)    AST 13 (*)    All other components within normal limits  CBG MONITORING, ED - Abnormal; Notable for the following components:   Glucose-Capillary 123 (*)    All other components within normal limits  POCT I-STAT, CHEM 8 - Abnormal; Notable for the following components:   Potassium 3.3 (*)    Glucose, Bld 100 (*)    All other components within normal limits  SARS CORONAVIRUS 2 (HOSPITAL ORDER, [place] [facility])  ETHANOL  PROTIME-INR  APTT  DIFFERENTIAL  RAPID URINE DRUG SCREEN, HOSP PERFORMED  URINALYSIS, ROUTINE W REFLEX MICROSCOPIC  I-STAT CHEM 8, ED  I-STAT CHEM 8, ED  POC URINE PREG, ED  POC URINE PREG, ED    EKG    Radiology Ct  Head Code Stroke Wo Contrast  Result Date: 05/21/2019 CLINICAL DATA:  Code stroke. 53 y/o F; headache and seeing double. History of stroke. EXAM: CT HEAD WITHOUT CONTRAST TECHNIQUE: Contiguous axial images were obtained from the base of the skull through the vertex without intravenous contrast. COMPARISON:  10/02/2017 CT head. FINDINGS: Brain: Stable small chronic infarctions within the left superior temporal lobe and the left lateral frontal subcortical white matter. Stable nonspecific white matter hypodensities compatible with chronic microvascular ischemic changes. No findings of acute stroke, hemorrhage, extra-axial collection, mass effect, hydrocephalus, or herniation. Vascular: Calcific atherosclerosis of carotid siphons. No hyperdense vessel identified. Skull: Normal. Negative for fracture or focal lesion. Sinuses/Orbits: No acute finding. Other: None. ASPECTS [facility] Stroke Program Early CT Score) - Ganglionic level infarction (caudate, lentiform nuclei, internal capsule, insula, M1-M3 cortex): 7 - Supraganglionic infarction (M4-M6 cortex): 3 Total score (0-10 with 10 being normal): 10 IMPRESSION: 1. No acute intracranial abnormality identified. 2. Stable small chronic infarctions within the left temporal lobe and left frontal lobe. Stable chronic microvascular ischemic changes of the brain. 3. ASPECTS is 10. These results were called by telephone at the time of interpretation on 05/21/2019 at 8:35 pm to Dr. Milton Ferguson , who verbally acknowledged these results. Electronically Signed   By: Kristine Garbe M.D.   On: 05/21/2019 20:38    Procedures Procedures (including critical care time)  Medications Ordered in ED Medications  ketorolac (TORADOL) 30 MG/ML injection 30 mg (30 mg Intravenous Given 05/21/19 2103)     Initial Impression / Assessment and Plan / ED Course  I have reviewed the triage vital signs and the nursing notes.  Pertinent labs & imaging results that were available  during my care of the patient were reviewed by me and considered in my medical decision making (see chart for details). Patient with double vision.  Possible stroke.  Patient seen by neurology and it was decided not to give her TPA but get her admitted to medicine and get an MRI tomorrow       CRITICAL CARE Performed by: Milton Ferguson Total critical care time: 45 minutes Critical  care time was exclusive of separately billable procedures and treating other patients. Critical care was necessary to treat or prevent imminent or life-threatening deterioration. Critical care was time spent personally by me on the following activities: development of treatment plan with patient and/or surrogate as well as nursing, discussions with consultants, evaluation of patient's response to treatment, examination of patient, obtaining history from patient or surrogate, ordering and performing treatments and interventions, ordering and review of laboratory studies, ordering and review of radiographic studies, pulse oximetry and re-evaluation of patient's condition.   Final Clinical Impressions(s) / ED Diagnoses   Final diagnoses:  None    ED Discharge Orders    None       Milton Ferguson, MD 05/21/19 2212

## 2019-05-22 ENCOUNTER — Other Ambulatory Visit (HOSPITAL_COMMUNITY): Payer: Self-pay

## 2019-05-22 ENCOUNTER — Encounter (HOSPITAL_COMMUNITY): Payer: Self-pay | Admitting: Internal Medicine

## 2019-05-22 ENCOUNTER — Observation Stay (HOSPITAL_COMMUNITY): Payer: Self-pay

## 2019-05-22 ENCOUNTER — Other Ambulatory Visit: Payer: Self-pay

## 2019-05-22 DIAGNOSIS — E785 Hyperlipidemia, unspecified: Secondary | ICD-10-CM | POA: Diagnosis present

## 2019-05-22 DIAGNOSIS — R519 Headache, unspecified: Secondary | ICD-10-CM | POA: Diagnosis present

## 2019-05-22 DIAGNOSIS — H532 Diplopia: Secondary | ICD-10-CM | POA: Diagnosis present

## 2019-05-22 DIAGNOSIS — E1169 Type 2 diabetes mellitus with other specified complication: Secondary | ICD-10-CM | POA: Diagnosis present

## 2019-05-22 LAB — CBC
HCT: 44.7 % (ref 36.0–46.0)
Hemoglobin: 15.2 g/dL — ABNORMAL HIGH (ref 12.0–15.0)
MCH: 32.9 pg (ref 26.0–34.0)
MCHC: 34 g/dL (ref 30.0–36.0)
MCV: 96.8 fL (ref 80.0–100.0)
Platelets: 279 10*3/uL (ref 150–400)
RBC: 4.62 MIL/uL (ref 3.87–5.11)
RDW: 13.7 % (ref 11.5–15.5)
WBC: 7.5 10*3/uL (ref 4.0–10.5)
nRBC: 0 % (ref 0.0–0.2)

## 2019-05-22 LAB — LIPID PANEL
Cholesterol: 180 mg/dL (ref 0–200)
HDL: 33 mg/dL — ABNORMAL LOW
LDL Cholesterol: UNDETERMINED mg/dL (ref 0–99)
Total CHOL/HDL Ratio: 5.5 ratio
Triglycerides: 456 mg/dL — ABNORMAL HIGH
VLDL: UNDETERMINED mg/dL (ref 0–40)

## 2019-05-22 LAB — GLUCOSE, CAPILLARY
Glucose-Capillary: 176 mg/dL — ABNORMAL HIGH (ref 70–99)
Glucose-Capillary: 125 mg/dL — ABNORMAL HIGH (ref 70–99)

## 2019-05-22 LAB — HEMOGLOBIN A1C
Hgb A1c MFr Bld: 8.9 % — ABNORMAL HIGH (ref 4.8–5.6)
Mean Plasma Glucose: 208.73 mg/dL

## 2019-05-22 LAB — URINALYSIS, ROUTINE W REFLEX MICROSCOPIC
Bilirubin Urine: NEGATIVE
Glucose, UA: NEGATIVE mg/dL
Hgb urine dipstick: NEGATIVE
Ketones, ur: NEGATIVE mg/dL
Leukocytes,Ua: NEGATIVE
Nitrite: NEGATIVE
Protein, ur: NEGATIVE mg/dL
Specific Gravity, Urine: 1.023 (ref 1.005–1.030)
pH: 6 (ref 5.0–8.0)

## 2019-05-22 LAB — COMPREHENSIVE METABOLIC PANEL WITH GFR
ALT: 12 U/L (ref 0–44)
AST: 9 U/L — ABNORMAL LOW (ref 15–41)
Albumin: 3.5 g/dL (ref 3.5–5.0)
Alkaline Phosphatase: 51 U/L (ref 38–126)
Anion gap: 7 (ref 5–15)
BUN: 17 mg/dL (ref 6–20)
CO2: 24 mmol/L (ref 22–32)
Calcium: 9.5 mg/dL (ref 8.9–10.3)
Chloride: 105 mmol/L (ref 98–111)
Creatinine, Ser: 0.66 mg/dL (ref 0.44–1.00)
GFR calc Af Amer: >60 mL/min
GFR calc non Af Amer: >60 mL/min
Glucose, Bld: 184 mg/dL — ABNORMAL HIGH (ref 70–99)
Potassium: 3.7 mmol/L (ref 3.5–5.1)
Sodium: 136 mmol/L (ref 135–145)
Total Bilirubin: 0.5 mg/dL (ref 0.3–1.2)
Total Protein: 6.3 g/dL — ABNORMAL LOW (ref 6.5–8.1)

## 2019-05-22 LAB — LDL CHOLESTEROL, DIRECT: Direct LDL: 105.1 mg/dL — ABNORMAL HIGH (ref 0–99)

## 2019-05-22 LAB — RAPID URINE DRUG SCREEN, HOSP PERFORMED
Amphetamines: NOT DETECTED
Barbiturates: NOT DETECTED
Benzodiazepines: NOT DETECTED
Cocaine: NOT DETECTED
Opiates: NOT DETECTED
Tetrahydrocannabinol: NOT DETECTED

## 2019-05-22 LAB — SARS CORONAVIRUS 2 BY RT PCR (HOSPITAL ORDER, PERFORMED IN ~~LOC~~ HOSPITAL LAB): SARS Coronavirus 2: NEGATIVE

## 2019-05-22 MED ORDER — FISH OIL 500 MG PO CAPS: 500.0000 mg | ORAL_CAPSULE | Freq: Every morning | ORAL | 3 refills | Status: DC

## 2019-05-22 MED ORDER — LOSARTAN POTASSIUM 50 MG PO TABS
100.0000 mg | ORAL_TABLET | Freq: Every day | ORAL | Status: DC
  Administered 2019-05-22: 100 mg via ORAL
  Filled 2019-05-22: qty 2

## 2019-05-22 MED ORDER — ISOSORBIDE MONONITRATE ER 60 MG PO TB24
30.0000 mg | ORAL_TABLET | Freq: Every day | ORAL | Status: DC
  Administered 2019-05-22: 30 mg via ORAL
  Filled 2019-05-22: qty 1

## 2019-05-22 MED ORDER — HYDRALAZINE HCL 25 MG PO TABS
50.0000 mg | ORAL_TABLET | Freq: Three times a day (TID) | ORAL | Status: DC
  Administered 2019-05-22: 11:00:00 50 mg via ORAL
  Filled 2019-05-22: qty 2

## 2019-05-22 MED ORDER — ATORVASTATIN CALCIUM 40 MG PO TABS
40.0000 mg | ORAL_TABLET | Freq: Every day | ORAL | Status: DC
  Administered 2019-05-22: 01:00:00 40 mg via ORAL
  Filled 2019-05-22: qty 1

## 2019-05-22 MED ORDER — CARVEDILOL 3.125 MG PO TABS
6.2500 mg | ORAL_TABLET | Freq: Two times a day (BID) | ORAL | Status: DC
  Administered 2019-05-22: 08:00:00 6.25 mg via ORAL
  Filled 2019-05-22: qty 2

## 2019-05-22 MED ORDER — INSULIN ASPART 100 UNIT/ML ~~LOC~~ SOLN: 0.0000 [IU] | Freq: Every day | SUBCUTANEOUS | Status: DC

## 2019-05-22 MED ORDER — INSULIN ASPART 100 UNIT/ML ~~LOC~~ SOLN
0.0000 [IU] | Freq: Three times a day (TID) | SUBCUTANEOUS | Status: DC
  Administered 2019-05-22: 2 [IU] via SUBCUTANEOUS

## 2019-05-22 MED ORDER — AMLODIPINE BESYLATE 5 MG PO TABS
10.0000 mg | ORAL_TABLET | Freq: Every day | ORAL | Status: DC
  Administered 2019-05-22: 10 mg via ORAL
  Filled 2019-05-22: qty 2

## 2019-05-22 MED ORDER — CLOPIDOGREL BISULFATE 75 MG PO TABS
75.0000 mg | ORAL_TABLET | Freq: Every day | ORAL | Status: DC
  Administered 2019-05-22: 75 mg via ORAL
  Filled 2019-05-22 (×2): qty 1

## 2019-05-22 NOTE — Evaluation (Signed)
Physical Therapy Evaluation Patient Details Name: Tammy Bauer MRN: [id number] DOB: Jun 06, 1966 Today's Date: 05/22/2019   History of Present Illness  Tammy Bauer  is a 53 y.o. 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  Pt states that her symptoms were similar to her prior CVA.  Pt states has been taking plavix.  No aspirin.  Pt denies syncope, cp, palp, sob, n/v, abd pain, diarrhea, brbpr, dysuria/ hematuria.    Clinical Impression  Patient functioning at baseline for functional mobility and gait.  Plan:  Patient discharged from physical therapy to care of nursing for ambulation daily as tolerated for length of stay.     Follow Up Recommendations No PT follow up    Equipment Recommendations  None recommended by PT    Recommendations for Other Services       Precautions / Restrictions Precautions Precautions: None Restrictions Weight Bearing Restrictions: No      Mobility  Bed Mobility Overal bed mobility: Independent                Transfers Overall transfer level: Independent                  Ambulation/Gait Ambulation/Gait assistance: Independent Gait Distance (Feet): 300 Feet Assistive device: None Gait Pattern/deviations: WFL(Within Functional Limits) Gait velocity: normal   General Gait Details: demonstrates good return for ambulation on level, inclined and declined surfaces without loss of balance  Stairs Stairs: Yes Stairs assistance: Modified independent (Device/Increase time) Stair Management: One rail Left;Alternating pattern Number of Stairs: 9 General stair comments: demonstrates good return for going up/down steps without loss of balance  Wheelchair Mobility    Modified Rankin (Stroke Patients Only)        Balance Overall balance assessment: Independent                                           Pertinent Vitals/Pain Pain Assessment: No/denies pain    Home Living Family/patient expects to be discharged to:: Private residence Living Arrangements: Spouse/significant other;Children Available Help at Discharge: Available 24 hours/day Type of Home: House Home Access: Stairs to enter   [employer] of Steps: 3 steps in front with no steps, 2 steps in back with bilateral siderails Home Layout: One level Home Equipment: None      Prior Function Level of Independence: Independent         Comments: Hydrographic surveyor, drives     Hand Dominance   Dominant Hand: Right    Extremity/Trunk Assessment   Upper Extremity Assessment Upper Extremity Assessment: Overall WFL for tasks assessed    Lower Extremity Assessment Lower Extremity Assessment: Overall WFL for tasks assessed    Cervical / Trunk Assessment Cervical / Trunk Assessment: Normal  Communication   Communication: No difficulties  Cognition Arousal/Alertness: Awake/alert Behavior During Therapy: WFL for tasks assessed/performed Overall Cognitive Status: Within Functional Limits for tasks assessed                                        General Comments      Exercises     Assessment/Plan  PT Assessment Patent does not need any further PT services  PT Problem List         PT Treatment Interventions      PT Goals (Current goals can be found in the Care Plan section)  Acute Rehab PT Goals Patient Stated Goal: return home PT Goal Formulation: With patient Time For Goal Achievement: 05/22/19 Potential to Achieve Goals: Good    Frequency     Barriers to discharge        Co-evaluation               AM-PAC PT "6 Clicks" Mobility  Outcome Measure Help needed turning from your back to your side while in a flat bed without using bedrails?: None Help  needed moving from lying on your back to sitting on the side of a flat bed without using bedrails?: None Help needed moving to and from a bed to a chair (including a wheelchair)?: None Help needed standing up from a chair using your arms (e.g., wheelchair or bedside chair)?: None Help needed to walk in hospital room?: None Help needed climbing 3-5 steps with a railing? : None 6 Click Score: 24    End of Session   Activity Tolerance: Patient tolerated treatment well Patient left: in bed;with call bell/phone within reach Nurse Communication: Mobility status PT Visit Diagnosis: Unsteadiness on feet (R26.81);Other abnormalities of gait and mobility (R26.89);Muscle weakness (generalized) (M62.81)    Time: 0810-0826 PT Time Calculation (min) (ACUTE ONLY): 16 min   Charges:   PT Evaluation $PT Eval Low Complexity: 1 Low PT Treatments $Gait Training: 8-22 mins        9:06 AM, 05/22/19 Ocie BobJames Zoey Bidwell, MPT Physical Therapist with [facility] [address] [phone] office [phone] mobile phone

## 2019-05-22 NOTE — Progress Notes (Signed)
OT Cancellation Note  Patient Details Name: Tammy Bauer MRN: [id number] DOB: 1966/03/26   Cancelled Treatment:    Reason Eval/Treat Not Completed: OT screened, no needs identified, will sign off. Pt screened in room for OT needs. Pt reports she has double vision at her baseline since first CVA in 06/2017. Typically her vision is an image and then a shadowed image, yesterday vision separated into two separate images combined with a headache. This am headache is gone and new vision changes have resolved. BUE strength is WNL, coordination and sensation are intact. Pt is performing ADLs independently. No further OT services required as pt is at her baseline functioning.    Guadelupe Sabin, OTR/L  [phone] 05/22/2019, 7:36 AM

## 2019-05-22 NOTE — Discharge Summary (Signed)
Physician Discharge Summary  Tammy Bauer TDV:[id number] DOB: [birth year] DOA: 05/21/2019  PCP: Kerin Perna, NP  Admit date: 05/21/2019  Discharge date: 05/22/2019  Admitted From:Home  Disposition:  Home  Recommendations for Outpatient Follow-up:  1. Follow up with PCP in 1-2 weeks 2. Continue on Plavix and Lipitor as prior 3. Recommended low-fat diet as well as prescription of fish oils due to elevated triglyceride levels.  Will need further follow-up with PCP in the near future. 4. Hemoglobin A1c also noted to be 8.9% which will require close follow-up.  Follow-up with neurology Dr. Merlene Laughter as scheduled in 4 weeks.  Home Health: None  Equipment/Devices: None  Discharge Condition: Stable  CODE STATUS: Full  Diet recommendation: Heart Healthy/carb modified  Brief/Interim Summary: Per HPI: Tammy Bauer  is a 53 y.o. 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  Pt states that her symptoms were similar to her prior CVA.  Pt states has been taking plavix.  No aspirin.  Pt denies syncope, cp, palp, sob, n/v, abd pain, diarrhea, brbpr, dysuria/ hematuria.   Patient was admitted to rule out CVA and CT of the brain did not demonstrate any acute abnormalities.  There appeared to be stable chronic microvascular ischemic changes also noted on MRI/MRI with no findings of CVA.  Patient's headache and double vision have spontaneously resolved.  She has no further symptomatology and has been instructed to remain on her home Plavix.  She has been assessed by PT/OT with no recommendations at this time.  She has been noted to have elevated triglyceride readings as noted below on her lipid panel for which I have recommended initiation of fish oils for now as well as low-fat  diet.  She will follow-up closely with her PCP.  No other acute events during the course of this admission.  Discharge Diagnoses:  Principal Problem:   Diplopia Active Problems:   Essential hypertension   DM2 (diabetes mellitus, type 2) (HCC)   TIA (transient ischemic attack)   Headache   Hyperlipidemia    Discharge Instructions  Discharge Instructions    Diet - low sodium heart healthy   Complete by: As directed    Increase activity slowly   Complete by: As directed      Allergies as of 05/22/2019      Reactions   Ace Inhibitors Cough      Medication List    TAKE these medications   acetaminophen 325 MG tablet Commonly known as: TYLENOL Take 650 mg by mouth every 6 (six) hours as needed for mild pain or moderate pain.   amLODipine 10 MG tablet Commonly known as: NORVASC Take 1 tablet (10 mg total) by mouth daily. TAKE ONE TABLET BY MOUTH ONCE DAILY What changed: additional instructions   atorvastatin 40 MG tablet Commonly known as: LIPITOR Take 1 tablet (40 mg total) by mouth at bedtime. Any generic statin from [facility] list is okay   carvedilol 6.25 MG tablet Commonly known as: COREG Take 1 tablet (6.25 mg total) by mouth 2 (two) times daily with a meal.   clopidogrel 75 MG tablet Commonly known as: PLAVIX Take 1 tablet (75 mg total) by mouth daily.   diclofenac sodium 1 % Gel Commonly known as: Voltaren Apply 2 g topically 4 (four) times daily.   Fish  Oil 500 MG Caps Take 1 capsule (500 mg total) by mouth every morning.   hydrALAZINE 50 MG tablet Commonly known as: APRESOLINE Take 1 tablet (50 mg total) by mouth 3 (three) times daily for 30 days.   insulin glargine 100 UNIT/ML injection Commonly known as: LANTUS Inject 0.4 mLs (40 Units total) into the skin daily. What changed:   how much to take  when to take this  additional instructions   INSULIN SYRINGE 1CC/29G 29G X 1/2" 1 ML Misc Commonly known as: B-D INSULIN SYRINGE 1 application by  Does not apply route at bedtime.   isosorbide mononitrate 30 MG 24 hr tablet Commonly known as: IMDUR Take 1 tablet (30 mg total) by mouth daily.   losartan 100 MG tablet Commonly known as: COZAAR Take 1 tablet (100 mg total) by mouth daily.      Follow-up Information    Grayce SessionsEdwards, Michelle P, NP Follow up in 1 week(s).   Specialty: Internal Medicine Contact information: [address] [place] [address] [phone]        Beryle Beamsoonquah, Kofi, MD Follow up in 4 week(s).   Specialty: Neurology Contact information: [address] [place] [address] [phone]          Allergies  Allergen Reactions  . Ace Inhibitors Cough    Consultations:  None   Procedures/Studies: Dg Chest 2 View  Result Date: 05/21/2019 CLINICAL DATA:  Double vision. EXAM: CHEST - 2 VIEW COMPARISON:  02/20/2019 FINDINGS: Implanted loop recorder again projects over the left chest wall.The cardiomediastinal contours are normal. The lungs are clear. Pulmonary vasculature is normal. No consolidation, pleural effusion, or pneumothorax. No acute osseous abnormalities are seen. IMPRESSION: No acute chest findings. Electronically Signed   By: Narda RutherfordMelanie  Sanford M.D.   On: 05/21/2019 23:38   Mr Brain Wo Contrast  Result Date: 05/22/2019 CLINICAL DATA:  Headache over the last 2 days.  Double vision. EXAM: MRI HEAD WITHOUT CONTRAST MRA HEAD WITHOUT CONTRAST TECHNIQUE: Multiplanar, multiecho pulse sequences of the brain and surrounding structures were obtained without intravenous contrast. Angiographic images of the head were obtained using MRA technique without contrast. COMPARISON:  Head CT 05/21/2019.  MRI 10/01/2017 FINDINGS: MRI HEAD FINDINGS Brain: Diffusion imaging does not show any acute or subacute infarction. The brainstem appears normal. Few old small vessel cerebellar infarctions. Cerebral hemispheres show old small vessel infarctions of the thalami and moderate chronic small-vessel  ischemic changes of the white matter. There is an old left insular region infarction. No mass lesion, hemorrhage, hydrocephalus or extra-axial collection. Vascular: Major vessels at the base of the brain show flow. Skull and upper cervical spine: Negative Sinuses/Orbits: Clear/normal Other: None MRA HEAD FINDINGS Both internal carotid arteries are patent through the skull base and siphon regions. The anterior and middle cerebral vessels are patent. Mild stenosis of the proximal M1 segment on the left. 3-4 mm aneurysm projecting inferiorly from the middle cerebral artery bifurcation region on the left, similar to the previous study. Infundibulum at the left posterior communicating region. Both vertebral arteries are patent to the basilar. No basilar stenosis. Posterior circulation branch vessels are patent. IMPRESSION: No acute intracranial finding. Moderate chronic small-vessel ischemic changes affecting the cerebral hemispheres. Old left temporal/insular infarction. No large or medium vessel occlusion. Redemonstration of a 3-4 mm aneurysm at the left middle cerebral bifurcation region. Mild stenosis of the proximal left M1 segment. Electronically Signed   By: Paulina FusiMark  Shogry M.D.   On: 05/22/2019 11:13   [place] Carotid Bilateral (  at [facility] Only)  Result Date: 05/22/2019 CLINICAL DATA:  Diplopia EXAM: BILATERAL CAROTID DUPLEX ULTRASOUND TECHNIQUE: Wallace Cullens scale imaging, color Doppler and duplex ultrasound were performed of bilateral carotid and vertebral arteries in the neck. COMPARISON:  CTA 10/02/2017 FINDINGS: Criteria: Quantification of carotid stenosis is based on velocity parameters that correlate the residual internal carotid diameter with NASCET-based stenosis levels, using the diameter of the distal internal carotid lumen as the denominator for stenosis measurement. The following velocity measurements were obtained: RIGHT ICA: 89/38 cm/sec CCA: 77/24 cm/sec SYSTOLIC ICA/CCA RATIO:  1.15 ECA: 96 cm/sec LEFT  ICA: 66/28 cm/sec CCA: 65/22 cm/sec SYSTOLIC ICA/CCA RATIO:  1.2 ECA: 83 cm/sec RIGHT CAROTID ARTERY: Diffuse atheromatous irregularity of the common and internal carotid artery. Partially calcified plaque in the bulb and ICA origin, without high-grade stenosis. Normal waveforms and color Doppler signal. RIGHT VERTEBRAL ARTERY:  Normal flow direction and waveform. LEFT CAROTID ARTERY: Partially calcified plaque in the bulb and proximal ICA. Diffuse atheromatous irregularity. No high-grade stenosis. Normal waveforms and color Doppler signal. LEFT VERTEBRAL ARTERY:  Normal flow direction and waveform. Bilateral thyroid lesions up to 1.9 cm incidentally noted. IMPRESSION: 1. Bilateral carotid bifurcation plaque resulting in less than 50% diameter stenosis. 2.  Antegrade bilateral vertebral arterial flow. 3. Thyroid nodules. Consider further evaluation with thyroid ultrasound. If patient is clinically hyperthyroid, consider nuclear medicine thyroid uptake and scan. Electronically Signed   By: Corlis Leak M.D.   On: 05/22/2019 11:17   Mr Maxine Glenn Head/brain ZO Cm  Result Date: 05/22/2019 CLINICAL DATA:  Headache over the last 2 days.  Double vision. EXAM: MRI HEAD WITHOUT CONTRAST MRA HEAD WITHOUT CONTRAST TECHNIQUE: Multiplanar, multiecho pulse sequences of the brain and surrounding structures were obtained without intravenous contrast. Angiographic images of the head were obtained using MRA technique without contrast. COMPARISON:  Head CT 05/21/2019.  MRI 10/01/2017 FINDINGS: MRI HEAD FINDINGS Brain: Diffusion imaging does not show any acute or subacute infarction. The brainstem appears normal. Few old small vessel cerebellar infarctions. Cerebral hemispheres show old small vessel infarctions of the thalami and moderate chronic small-vessel ischemic changes of the white matter. There is an old left insular region infarction. No mass lesion, hemorrhage, hydrocephalus or extra-axial collection. Vascular: Major vessels at  the base of the brain show flow. Skull and upper cervical spine: Negative Sinuses/Orbits: Clear/normal Other: None MRA HEAD FINDINGS Both internal carotid arteries are patent through the skull base and siphon regions. The anterior and middle cerebral vessels are patent. Mild stenosis of the proximal M1 segment on the left. 3-4 mm aneurysm projecting inferiorly from the middle cerebral artery bifurcation region on the left, similar to the previous study. Infundibulum at the left posterior communicating region. Both vertebral arteries are patent to the basilar. No basilar stenosis. Posterior circulation branch vessels are patent. IMPRESSION: No acute intracranial finding. Moderate chronic small-vessel ischemic changes affecting the cerebral hemispheres. Old left temporal/insular infarction. No large or medium vessel occlusion. Redemonstration of a 3-4 mm aneurysm at the left middle cerebral bifurcation region. Mild stenosis of the proximal left M1 segment. Electronically Signed   By: Paulina Fusi M.D.   On: 05/22/2019 11:13   Ct Head Code Stroke Wo Contrast  Result Date: 05/21/2019 CLINICAL DATA:  Code stroke. 53 y/o F; headache and seeing double. History of stroke. EXAM: CT HEAD WITHOUT CONTRAST TECHNIQUE: Contiguous axial images were obtained from the base of the skull through the vertex without intravenous contrast. COMPARISON:  10/02/2017 CT head. FINDINGS: Brain: Stable small  chronic infarctions within the left superior temporal lobe and the left lateral frontal subcortical white matter. Stable nonspecific white matter hypodensities compatible with chronic microvascular ischemic changes. No findings of acute stroke, hemorrhage, extra-axial collection, mass effect, hydrocephalus, or herniation. Vascular: Calcific atherosclerosis of carotid siphons. No hyperdense vessel identified. Skull: Normal. Negative for fracture or focal lesion. Sinuses/Orbits: No acute finding. Other: None. ASPECTS [facility]([place] Stroke  Program Early CT Score) - Ganglionic level infarction (caudate, lentiform nuclei, internal capsule, insula, M1-M3 cortex): 7 - Supraganglionic infarction (M4-M6 cortex): 3 Total score (0-10 with 10 being normal): 10 IMPRESSION: 1. No acute intracranial abnormality identified. 2. Stable small chronic infarctions within the left temporal lobe and left frontal lobe. Stable chronic microvascular ischemic changes of the brain. 3. ASPECTS is 10. These results were called by telephone at the time of interpretation on 05/21/2019 at 8:35 pm to Dr. Bethann BerkshireJOSEPH ZAMMIT , who verbally acknowledged these results. Electronically Signed   By: Mitzi HansenLance  Furusawa-Stratton M.D.   On: 05/21/2019 20:38     Discharge Exam: Vitals:   05/22/19 0656 05/22/19 0900  BP: (!) 145/85 (!) 165/82  Pulse: 72 73  Resp: 18 18  Temp: 98.5 F (36.9 C) 98.1 F (36.7 C)  SpO2: 96% 99%   Vitals:   05/22/19 0257 05/22/19 0458 05/22/19 0656 05/22/19 0900  BP: 131/65 132/69 (!) 145/85 (!) 165/82  Pulse: 77 71 72 73  Resp: 20 17 18 18   Temp: 98.6 F (37 C) 98.3 F (36.8 C) 98.5 F (36.9 C) 98.1 F (36.7 C)  TempSrc: Oral Oral Oral Oral  SpO2: 99% 98% 96% 99%  Weight:      Height:        General: Pt is alert, awake, not in acute distress Cardiovascular: RRR, S1/S2 +, no rubs, no gallops Respiratory: CTA bilaterally, no wheezing, no rhonchi Abdominal: Soft, NT, ND, bowel sounds + Extremities: no edema, no cyanosis    The results of significant diagnostics from this hospitalization (including imaging, microbiology, ancillary and laboratory) are listed below for reference.     Microbiology: Recent Results (from the past 240 hour(s))  SARS Coronavirus 2 (CEPHEID - Performed in [facility] lab), Hosp Order     Status: None   Collection Time: 05/21/19 10:34 PM   Specimen: Nasopharyngeal Swab  Result Value Ref Range Status   SARS Coronavirus 2 NEGATIVE NEGATIVE Final    Comment: (NOTE) If result is  NEGATIVE SARS-CoV-2 target nucleic acids are NOT DETECTED. The SARS-CoV-2 RNA is generally detectable in upper and lower  respiratory specimens during the acute phase of infection. The lowest  concentration of SARS-CoV-2 viral copies this assay can detect is 250  copies / mL. A negative result does not preclude SARS-CoV-2 infection  and should not be used as the sole basis for treatment or other  patient management decisions.  A negative result may occur with  improper specimen collection / handling, submission of specimen other  than nasopharyngeal swab, presence of viral mutation(s) within the  areas targeted by this assay, and inadequate number of viral copies  (<250 copies / mL). A negative result must be combined with clinical  observations, patient history, and epidemiological information. If result is POSITIVE SARS-CoV-2 target nucleic acids are DETECTED. The SARS-CoV-2 RNA is generally detectable in upper and lower  respiratory specimens dur ing the acute phase of infection.  Positive  results are indicative of active infection with SARS-CoV-2.  Clinical  correlation with patient history and other diagnostic information is  necessary to determine patient infection status.  Positive results do  not rule out bacterial infection or co-infection with other viruses. If result is PRESUMPTIVE POSTIVE SARS-CoV-2 nucleic acids MAY BE PRESENT.   A presumptive positive result was obtained on the submitted specimen  and confirmed on repeat testing.  While 2019 novel coronavirus  (SARS-CoV-2) nucleic acids may be present in the submitted sample  additional confirmatory testing may be necessary for epidemiological  and / or clinical management purposes  to differentiate between  SARS-CoV-2 and other Sarbecovirus currently known to infect humans.  If clinically indicated additional testing with an alternate test  methodology [phone]([id number]) is advised. The SARS-CoV-2 RNA is generally  detectable  in upper and lower respiratory sp ecimens during the acute  phase of infection. The expected result is Negative. Fact Sheet for Patients:  BoilerBrush.com.cyhttps://www.fda.gov/media/136312/download Fact Sheet for Healthcare Providers: https://pope.com/https://www.fda.gov/media/136313/download This test is not yet approved or cleared by the [place] FDA and has been authorized for detection and/or diagnosis of SARS-CoV-2 by FDA under an Emergency Use Authorization (EUA).  This EUA will remain in effect (meaning this test can be used) for the duration of the COVID-19 declaration under Section 564(b)(1) of the Act, 21 U.S.C. section 360bbb-3(b)(1), unless the authorization is terminated or revoked sooner. Performed at [facility], [address] [place], [place] [address]      Labs: BNP (last 3 results) No results for input(s): BNP in the last 8760 hours. Basic Metabolic Panel: Recent Labs  Lab 05/21/19 2016 05/21/19 2055 05/22/19 0434  NA 135 140 136  K 3.6 3.3* 3.7  CL 104 109 105  CO2 23  --  24  GLUCOSE 113* 100* 184*  BUN 14 13 17   CREATININE 0.63 0.50 0.66  CALCIUM 9.5  --  9.5   Liver Function Tests: Recent Labs  Lab 05/21/19 2016 05/22/19 0434  AST 13* 9*  ALT 15 12  ALKPHOS 54 51  BILITOT 0.5 0.5  PROT 7.5 6.3*  ALBUMIN 4.2 3.5   No results for input(s): LIPASE, AMYLASE in the last 168 hours. No results for input(s): AMMONIA in the last 168 hours. CBC: Recent Labs  Lab 05/21/19 2016 05/21/19 2055 05/22/19 0434  WBC 8.6  --  7.5  NEUTROABS 4.8  --   --   HGB 15.9* 14.3 15.2*  HCT 47.3* 42.0 44.7  MCV 96.5  --  96.8  PLT 340  --  279   Cardiac Enzymes: No results for input(s): CKTOTAL, CKMB, CKMBINDEX, TROPONINI in the last 168 hours. BNP: Invalid input(s): POCBNP CBG: Recent Labs  Lab 05/21/19 2015 05/21/19 2341 05/22/19 0807 05/22/19 1158  GLUCAP 123* 114* 176* 125*   D-Dimer No results for input(s): DDIMER in the last 72 hours. Hgb A1c Recent Labs     05/20/19 2016  HGBA1C 8.9*   Lipid Profile Recent Labs    05/22/19 0434  CHOL 180  HDL 33*  LDLCALC UNABLE TO CALCULATE IF TRIGLYCERIDE OVER 400 mg/dL  TRIG [id number]*  CHOLHDL 5.5   Thyroid function studies Recent Labs    05/21/19 2016  TSH 2.530   Anemia work up No results for input(s): VITAMINB12, FOLATE, FERRITIN, TIBC, IRON, RETICCTPCT in the last 72 hours. Urinalysis    Component Value Date/Time   COLORURINE YELLOW 05/21/2019 0100   APPEARANCEUR HAZY (A) 05/21/2019 0100   LABSPEC 1.023 05/21/2019 0100   PHURINE 6.0 05/21/2019 0100   GLUCOSEU NEGATIVE 05/21/2019 0100   HGBUR NEGATIVE 05/21/2019 0100   BILIRUBINUR NEGATIVE  05/21/2019 0100   KETONESUR NEGATIVE 05/21/2019 0100   PROTEINUR NEGATIVE 05/21/2019 0100   UROBILINOGEN 1.0 05/22/2015 2045   NITRITE NEGATIVE 05/21/2019 0100   LEUKOCYTESUR NEGATIVE 05/21/2019 0100   Sepsis Labs Invalid input(s): PROCALCITONIN,  WBC,  LACTICIDVEN Microbiology Recent Results (from the past 240 hour(s))  SARS Coronavirus 2 (CEPHEID - Performed in [facility] hospital lab), Hosp Order     Status: None   Collection Time: 05/21/19 10:34 PM   Specimen: Nasopharyngeal Swab  Result Value Ref Range Status   SARS Coronavirus 2 NEGATIVE NEGATIVE Final    Comment: (NOTE) If result is NEGATIVE SARS-CoV-2 target nucleic acids are NOT DETECTED. The SARS-CoV-2 RNA is generally detectable in upper and lower  respiratory specimens during the acute phase of infection. The lowest  concentration of SARS-CoV-2 viral copies this assay can detect is 250  copies / mL. A negative result does not preclude SARS-CoV-2 infection  and should not be used as the sole basis for treatment or other  patient management decisions.  A negative result may occur with  improper specimen collection / handling, submission of specimen other  than nasopharyngeal swab, presence of viral mutation(s) within the  areas targeted by this assay, and inadequate number of viral  copies  (<250 copies / mL). A negative result must be combined with clinical  observations, patient history, and epidemiological information. If result is POSITIVE SARS-CoV-2 target nucleic acids are DETECTED. The SARS-CoV-2 RNA is generally detectable in upper and lower  respiratory specimens dur ing the acute phase of infection.  Positive  results are indicative of active infection with SARS-CoV-2.  Clinical  correlation with patient history and other diagnostic information is  necessary to determine patient infection status.  Positive results do  not rule out bacterial infection or co-infection with other viruses. If result is PRESUMPTIVE POSTIVE SARS-CoV-2 nucleic acids MAY BE PRESENT.   A presumptive positive result was obtained on the submitted specimen  and confirmed on repeat testing.  While 2019 novel coronavirus  (SARS-CoV-2) nucleic acids may be present in the submitted sample  additional confirmatory testing may be necessary for epidemiological  and / or clinical management purposes  to differentiate between  SARS-CoV-2 and other Sarbecovirus currently known to infect humans.  If clinically indicated additional testing with an alternate test  methodology [phone] is advised. The SARS-CoV-2 RNA is generally  detectable in upper and lower respiratory sp ecimens during the acute  phase of infection. The expected result is Negative. Fact Sheet for Patients:  BoilerBrush.com.cy Fact Sheet for Healthcare Providers: https://pope.com/ This test is not yet approved or cleared by the [place] FDA and has been authorized for detection and/or diagnosis of SARS-CoV-2 by FDA under an Emergency Use Authorization (EUA).  This EUA will remain in effect (meaning this test can be used) for the duration of the COVID-19 declaration under Section 564(b)(1) of the Act, 21 U.S.C. section 360bbb-3(b)(1), unless the authorization is terminated  or revoked sooner. Performed at [facility], [address], [place], [place] [address]      Time coordinating discharge: 35 minutes  SIGNED:   Erick Blinks, DO [facility] 05/22/2019, 12:20 PM  If 7PM-7AM, please contact night-coverage [email or web address] Password [email or web address]

## 2019-05-23 LAB — HIV ANTIBODY (ROUTINE TESTING W REFLEX): HIV Screen 4th Generation wRfx: NONREACTIVE

## 2019-05-26 ENCOUNTER — Other Ambulatory Visit: Payer: Self-pay

## 2019-05-26 ENCOUNTER — Ambulatory Visit: Payer: Self-pay | Attending: Primary Care | Admitting: Pharmacist

## 2019-05-26 ENCOUNTER — Telehealth (INDEPENDENT_AMBULATORY_CARE_PROVIDER_SITE_OTHER): Payer: Self-pay

## 2019-05-26 DIAGNOSIS — E118 Type 2 diabetes mellitus with unspecified complications: Secondary | ICD-10-CM

## 2019-05-26 DIAGNOSIS — IMO0002 Reserved for concepts with insufficient information to code with codable children: Secondary | ICD-10-CM

## 2019-05-26 DIAGNOSIS — E1165 Type 2 diabetes mellitus with hyperglycemia: Secondary | ICD-10-CM

## 2019-05-26 NOTE — Telephone Encounter (Signed)
Patient is aware that HIV result is negative. Patient will call on 6/24 to schedule a lab only visit to redraw lipids. Nat Christen, CMA

## 2019-05-26 NOTE — Telephone Encounter (Signed)
-----   Message from Kerin Perna, NP sent at 05/23/2019  8:00 PM EDT ----- Results HIV neg non sure why LDL direct order

## 2019-05-26 NOTE — Progress Notes (Signed)
    S:    PCP: Juluis Mire  No chief complaint on file.  Patient arrives in good spirits. Presents for diabetes evaluation, education, and management at the request of Sharyn Lull. Patient was referred on 05/19/19.    Family/Social History:  - FHx: negative for DM - Tobacco: current 0.25 PPD smoker - Alcohol: denies use  Insurance coverage/medication affordability: self pay  Patient reports adherence with medications.  Current diabetes medications include: Lantus 25-35 units daily Current hypertension medications include: amlodipine 10 mg daily, carvedilol 6.25 mg BID, hydralazine 50 mg TID, isosorbide mononitrate 30 mg daily, losartan 100 mg daily Current hyperlipidemia medications include: atorvastatin 40 mg daily  Patient denies hypoglycemic events.  Patient reported dietary habits:  - Dietary non-compliant - Endorses daily intake of sweets (Nutty bars, Zero bars) - Drinks 1 soda a day - Does not limit carbs   Patient-reported exercise habits:  - walks 30 minutes/day   Patient denies polyuria, polydipsia.  Patient denies neuropathy. Patient denies visual changes. Patient reports self foot exams.   O:  POCT glucose: 125  Home fasting CBG: does not check  Before bedtime: high 100s (160s-190s).  Lab Results  Component Value Date   HGBA1C 8.9 (H) 05/20/2019   There were no vitals filed for this visit.  Lipid Panel     Component Value Date/Time   CHOL 180 05/22/2019 0434   CHOL 200 (H) 05/19/2019 0937   TRIG 456 (H) 05/22/2019 0434   HDL 33 (L) 05/22/2019 0434   HDL 35 (L) 05/19/2019 0937   CHOLHDL 5.5 05/22/2019 0434   VLDL UNABLE TO CALCULATE IF TRIGLYCERIDE OVER 400 mg/dL 05/22/2019 0434   LDLCALC UNABLE TO CALCULATE IF TRIGLYCERIDE OVER 400 mg/dL 05/22/2019 0434   LDLCALC 135 (H) 05/19/2019 0937   LDLDIRECT 105.1 (H) 05/22/2019 0434   Clinical ASCVD: Yes  The ASCVD Risk score Mikey Bussing DC Jr., et al., 2013) failed to calculate for the following reasons:    The patient has a prior MI or stroke diagnosis   A/P: Diabetes longstanding currently uncontrolled. Patient is able to verbalize appropriate hypoglycemia management plan. Patient is adherent with medication but self-titrates her insulin. Control is suboptimal due to dietary indiscretion. We do not have enough home data to recommend any changes. Pt is amenable to check sugar at home more frequently. Will meet again in 3 weeks to reassess.   -Continued Lantus. Encouraged patient to take consistently as prescribed. -Encouraged daily monitoring of home CBG; check fasting daily. Vary the times you check after meals.  -Extensively discussed pathophysiology of DM, recommended lifestyle interventions, dietary effects on glycemic control -Counseled on s/sx of and management of hypoglycemia -Next A1C anticipated 08/2019.   ASCVD risk - secondary prevention in patient with DM. Last LDL is not controlled. high intensity statin indicated. - Continue atorvastatin 40 mg daily.   HM:  -UTD on recommended vaccines.   Written patient instructions provided. Total time in face to face counseling 30 minutes.   Follow up Pharmacist Clinic Visit 06/16/19.     Patient seen with: Jonathon Bellows PharmD Candidate [phone] [place], PharmD, [place] [phone]

## 2019-05-26 NOTE — Patient Instructions (Signed)
Thank you for coming to see me today. Please do the following:  1. Continue your insulin.  2. Your cholesterol medication is filled through our pharmacy. It's called atorvastatin. It's important that you take this daily! 3. Continue checking blood sugars at home. 4. Check first thing in the morning and before bedtime. Also, vary the other times you check, 5. Continue making the lifestyle changes we've discussed together during our visit. Diet and exercise play a significant role in improving your blood sugars.  6. Follow-up with me in 3 weeks.    Hypoglycemia or low blood sugar:   Low blood sugar can happen quickly and may become an emergency if not treated right away.   While this shouldn't happen often, it can be brought upon if you skip a meal or do not eat enough. Also, if your insulin or other diabetes medications are dosed too high, this can cause your blood sugar to go to low.   Warning signs of low blood sugar include: 1. Feeling shaky or dizzy 2. Feeling weak or tired  3. Excessive hunger 4. Feeling anxious or upset  5. Sweating even when you aren't exercising  What to do if I experience low blood sugar? 1. Check your blood sugar with your meter. If lower than 70, proceed to step 2.  2. Treat with 3-4 glucose tablets or 3 packets of regular sugar. If these aren't around, you can try hard candy. Yet another option would be to drink 4 ounces of fruit juice or 6 ounces of REGULAR soda.  3. Re-check your sugar in 15 minutes. If it is still below 70, do what you did in step 2 again. If has come back up, go ahead and eat a snack or small meal at this time.

## 2019-05-27 ENCOUNTER — Encounter: Payer: Self-pay | Admitting: Pharmacist

## 2019-06-01 LAB — ACETYLCHOLINE RECEPTOR AB, ALL
Acety choline binding ab: 0.04 nmol/L (ref 0.00–0.24)
Acetylchol Block Ab: 7 % (ref 0–25)
Acetylcholine Modulat Ab: <12 % (ref 0–20)

## 2019-06-09 ENCOUNTER — Ambulatory Visit (INDEPENDENT_AMBULATORY_CARE_PROVIDER_SITE_OTHER): Payer: Self-pay | Admitting: *Deleted

## 2019-06-09 DIAGNOSIS — I639 Cerebral infarction, unspecified: Secondary | ICD-10-CM

## 2019-06-10 LAB — CUP PACEART REMOTE DEVICE CHECK
Date Time Interrogation Session: 20200703153825
Implantable Pulse Generator Implant Date: 20180725

## 2019-06-16 ENCOUNTER — Ambulatory Visit: Payer: Self-pay | Admitting: Pharmacist

## 2019-06-19 NOTE — Progress Notes (Signed)
[facility] Summary Report / Loop Recorder 

## 2019-06-30 ENCOUNTER — Other Ambulatory Visit (INDEPENDENT_AMBULATORY_CARE_PROVIDER_SITE_OTHER): Payer: Self-pay | Admitting: Primary Care

## 2019-06-30 DIAGNOSIS — Z760 Encounter for issue of repeat prescription: Secondary | ICD-10-CM

## 2019-06-30 MED FILL — ?AMLODIPINE BESYLATE 10 MG: 10 | 30 days supply | Qty: 30 | Fill #4

## 2019-06-30 MED FILL — ?CARVEDILOL 6.25 MG TABLET: 6.25 | 30 days supply | Qty: 60 | Fill #2

## 2019-06-30 MED FILL — ?ATORVASTATIN 40MG TABLET: 40 | 30 days supply | Qty: 30 | Fill #2

## 2019-06-30 MED FILL — CLOPIDOGREL 75 MG TABLET: 75 | 30 days supply | Qty: 30 | Fill #0

## 2019-06-30 MED FILL — ISOSORBIDE MN ER 30 MG TAB: 30 | 30 days supply | Qty: 30 | Fill #2

## 2019-06-30 MED FILL — LOSARTAN POTASSIUM 100 MG T: 100 | 30 days supply | Qty: 30 | Fill #3

## 2019-06-30 MED FILL — ?HydrALAZINE HCL 10 MG TABS: 10 | 30 days supply | Qty: 90 | Fill #5

## 2019-07-09 ENCOUNTER — Ambulatory Visit (INDEPENDENT_AMBULATORY_CARE_PROVIDER_SITE_OTHER): Payer: Self-pay | Admitting: *Deleted

## 2019-07-09 DIAGNOSIS — I639 Cerebral infarction, unspecified: Secondary | ICD-10-CM

## 2019-07-09 LAB — CUP PACEART REMOTE DEVICE CHECK
Date Time Interrogation Session: 20200805141231
Implantable Pulse Generator Implant Date: 20180725

## 2019-07-15 NOTE — Progress Notes (Signed)
[facility] Summary Report / Loop Recorder 

## 2019-08-12 ENCOUNTER — Ambulatory Visit (INDEPENDENT_AMBULATORY_CARE_PROVIDER_SITE_OTHER): Payer: Self-pay | Admitting: *Deleted

## 2019-08-12 DIAGNOSIS — I639 Cerebral infarction, unspecified: Secondary | ICD-10-CM

## 2019-08-12 LAB — CUP PACEART REMOTE DEVICE CHECK
Date Time Interrogation Session: 20200907194122
Implantable Pulse Generator Implant Date: 20180725

## 2019-08-12 MED FILL — hydrALAZINE HCL 10 MG TABS: 10 | 90 days supply | Qty: 90 | Fill #0

## 2019-08-12 MED FILL — ?AMLODIPINE BESYLATE 10 MG: 10 | 30 days supply | Qty: 30 | Fill #0

## 2019-08-12 MED FILL — ISOSORBIDE MN ER 30 MG TAB: 30 | 30 days supply | Qty: 30 | Fill #3

## 2019-08-12 MED FILL — ?ATORVASTATIN 40MG TABLET: 40 | 30 days supply | Qty: 30 | Fill #3

## 2019-08-12 MED FILL — CLOPIDOGREL 75 MG TABLET: 75 | 30 days supply | Qty: 30 | Fill #1

## 2019-08-12 MED FILL — ?CARVEDILOL 6.25 MG TABLET: 6.25 | 30 days supply | Qty: 60 | Fill #3

## 2019-08-12 MED FILL — LOSARTAN POTASSIUM 100 MG T: 100 | 30 days supply | Qty: 30 | Fill #4

## 2019-08-27 NOTE — Progress Notes (Signed)
[facility] Summary Report / Loop Recorder 

## 2019-09-11 MED FILL — LOSARTAN POTASSIUM 100 MG T: 100 | 30 days supply | Qty: 30 | Fill #5

## 2019-09-11 MED FILL — ISOSORBIDE MN ER 30 MG TAB: 30 | 30 days supply | Qty: 30 | Fill #4

## 2019-09-11 MED FILL — ?AMLODIPINE BESYLATE 10 MG: 10 | 30 days supply | Qty: 30 | Fill #1

## 2019-09-11 MED FILL — ?CARVEDILOL 6.25 MG TABLET: 6.25 | 30 days supply | Qty: 60 | Fill #4

## 2019-09-11 MED FILL — CLOPIDOGREL 75 MG TABLET: 75 | 30 days supply | Qty: 30 | Fill #2

## 2019-09-11 MED FILL — ?ATORVASTATIN 40MG TABLET: 40 | 30 days supply | Qty: 30 | Fill #4

## 2019-09-15 ENCOUNTER — Ambulatory Visit (INDEPENDENT_AMBULATORY_CARE_PROVIDER_SITE_OTHER): Payer: Self-pay | Admitting: *Deleted

## 2019-09-15 DIAGNOSIS — I639 Cerebral infarction, unspecified: Secondary | ICD-10-CM

## 2019-09-15 LAB — CUP PACEART REMOTE DEVICE CHECK
Date Time Interrogation Session: 20201012162651
Implantable Pulse Generator Implant Date: 20180725

## 2019-09-25 NOTE — Progress Notes (Signed)
[facility] Summary Report / Loop Recorder 

## 2019-10-02 ENCOUNTER — Other Ambulatory Visit: Payer: Self-pay

## 2019-10-02 ENCOUNTER — Ambulatory Visit (INDEPENDENT_AMBULATORY_CARE_PROVIDER_SITE_OTHER): Payer: Self-pay | Admitting: Primary Care

## 2019-10-02 ENCOUNTER — Encounter (INDEPENDENT_AMBULATORY_CARE_PROVIDER_SITE_OTHER): Payer: Self-pay | Admitting: Primary Care

## 2019-10-02 VITALS — BP 143/85 | HR 80 | Temp 97.3°F | Ht 62.0 in | Wt 147.2 lb

## 2019-10-02 DIAGNOSIS — E1159 Type 2 diabetes mellitus with other circulatory complications: Secondary | ICD-10-CM

## 2019-10-02 DIAGNOSIS — Z760 Encounter for issue of repeat prescription: Secondary | ICD-10-CM

## 2019-10-02 DIAGNOSIS — E782 Mixed hyperlipidemia: Secondary | ICD-10-CM

## 2019-10-02 DIAGNOSIS — I10 Essential (primary) hypertension: Secondary | ICD-10-CM

## 2019-10-02 DIAGNOSIS — Z23 Encounter for immunization: Secondary | ICD-10-CM

## 2019-10-02 DIAGNOSIS — Z720 Tobacco use: Secondary | ICD-10-CM

## 2019-10-02 DIAGNOSIS — Z794 Long term (current) use of insulin: Secondary | ICD-10-CM

## 2019-10-02 LAB — GLUCOSE, POCT (MANUAL RESULT ENTRY): POC Glucose: 188 mg/dl — AB (ref 70–99)

## 2019-10-02 LAB — POCT GLYCOSYLATED HEMOGLOBIN (HGB A1C): Hemoglobin A1C: 8.1 % — AB (ref 4.0–5.6)

## 2019-10-02 MED ORDER — GLIPIZIDE 10 MG PO TABS: 10.0000 mg | ORAL_TABLET | Freq: Two times a day (BID) | ORAL | 3 refills | Status: DC

## 2019-10-02 MED ORDER — FISH OIL 500 MG PO CAPS: 500.0000 mg | ORAL_CAPSULE | Freq: Every morning | ORAL | 3 refills | Status: DC

## 2019-10-02 MED ORDER — HYDRALAZINE HCL 50 MG PO TABS: 50.0000 mg | ORAL_TABLET | Freq: Three times a day (TID) | ORAL | 0 refills | Status: DC

## 2019-10-02 MED ORDER — AMLODIPINE BESYLATE 10 MG PO TABS: 10.0000 mg | ORAL_TABLET | Freq: Every day | ORAL | 3 refills | Status: DC

## 2019-10-02 MED ORDER — INSULIN GLARGINE 100 UNIT/ML ~~LOC~~ SOLN: 40.0000 [IU] | Freq: Every day | SUBCUTANEOUS | 3 refills | Status: DC

## 2019-10-02 MED ORDER — CARVEDILOL 12.5 MG PO TABS: 12.5000 mg | ORAL_TABLET | Freq: Two times a day (BID) | ORAL | 3 refills | Status: DC

## 2019-10-02 MED ORDER — ISOSORBIDE MONONITRATE ER 30 MG PO TB24: 30.0000 mg | ORAL_TABLET | Freq: Every day | ORAL | 3 refills | Status: DC

## 2019-10-02 MED ORDER — ATORVASTATIN CALCIUM 40 MG PO TABS: 40.0000 mg | ORAL_TABLET | Freq: Every day | ORAL | 5 refills | Status: DC

## 2019-10-02 MED ORDER — CLOPIDOGREL BISULFATE 75 MG PO TABS: 75.0000 mg | ORAL_TABLET | Freq: Every day | ORAL | 3 refills | Status: DC

## 2019-10-02 MED ORDER — LOSARTAN POTASSIUM 100 MG PO TABS: 100.0000 mg | ORAL_TABLET | Freq: Every day | ORAL | 3 refills | Status: DC

## 2019-10-02 MED FILL — ?ATORVASTATIN 40MG TABLET: 40 | 30 days supply | Qty: 30 | Fill #0

## 2019-10-02 MED FILL — hydrALAZINE HCL 50 MG TABS: 50 | 30 days supply | Qty: 90 | Fill #0

## 2019-10-02 MED FILL — CARVEDILOL 12.5 MG TABLET: 12.5 | 30 days supply | Qty: 60 | Fill #0

## 2019-10-02 MED FILL — !LANTUS 100 UNITS/ML VIAL: 100 | 25 days supply | Qty: 10 | Fill #0

## 2019-10-02 MED FILL — ?GLIPIZIDE 10 MG TABLET: 10 | 30 days supply | Qty: 60 | Fill #0

## 2019-10-02 NOTE — Progress Notes (Addendum)
Established Patient Office Visit  Subjective:  Patient ID: Tammy Bauer, female    DOB: [birth year]  Age: 53 y.o. MRN: [id number]  CC:  Chief Complaint  Patient presents with  . Hypertension  . Diabetes    HPI [name] presents for management of hypertension -she denies shortness of breath, headaches, chest pain or lower extremity edema. Bp elevated 143/85 and she has taken her bp meds this AM. Diabetes CBG 188 and A1C 8.1 uncontrolled .She is not able to tolerate metformin.  Past Medical History:  Diagnosis Date  . Anemia   . Diabetes mellitus without complication (HCC)   . Hypertension   . Stroke [facility])    x2; no deficits  . Thyroid nodule 10/02/2017    Past Surgical History:  Procedure Laterality Date  . ENDOMETRIAL ABLATION N/A 01/30/2018   Procedure: ENDOMETRIAL ABLATION WITH MINERVA;  Surgeon: Lazaro Arms, MD;  Location: [facility];  Service: Gynecology;  Laterality: N/A;  . HYSTEROSCOPY W/D&C N/A 01/30/2018   Procedure: DILATATION AND CURETTAGE /HYSTEROSCOPY;  Surgeon: Lazaro Arms, MD;  Location: [facility];  Service: Gynecology;  Laterality: N/A;  . LOOP RECORDER INSERTION N/A 06/27/2017   Procedure: Loop Recorder Insertion;  Surgeon: Regan Lemming, MD;  Location: [facility];  Service: Cardiovascular;  Laterality: N/A;  . TEE WITHOUT CARDIOVERSION N/A 06/27/2017   Procedure: TRANSESOPHAGEAL ECHOCARDIOGRAM (TEE);  Surgeon: [name], MD;  Location: [facility];  Service: Cardiovascular;  Laterality: N/A;    Family History  Problem Relation Age of Onset  . Stroke Maternal Grandfather     Social History   Socioeconomic History  . Marital status: Married    Spouse name: Not on file  . Number of children: Not on file  . Years of education: Not on file  . Highest education level: Not on file  Occupational History  . Not on file  Social Needs  . Financial resource strain: Not on file  . Food insecurity    Worry: Not on file     Inability: Not on file  . Transportation needs    Medical: Not on file    Non-medical: Not on file  Tobacco Use  . Smoking status: Current Every Day Smoker    Packs/day: 0.25    Years: 20.00    Pack years: 5.00    Types: Cigarettes    Start date: 08/29/1999  . Smokeless tobacco: Never Used  . Tobacco comment: smoke 5 cigarettes a day  Substance and Sexual Activity  . Alcohol use: No  . Drug use: No  . Sexual activity: Not Currently    Birth control/protection: None  Lifestyle  . Physical activity    Days per week: Not on file    Minutes per session: Not on file  . Stress: Not on file  Relationships  . Social Musician on phone: Not on file    Gets together: Not on file    Attends religious service: Not on file    Active member of club or organization: Not on file    Attends meetings of clubs or organizations: Not on file    Relationship status: Not on file  . Intimate partner violence    Fear of current or ex partner: Not on file    Emotionally abused: Not on file    Physically abused: Not on file    Forced sexual activity: Not on file  Other Topics Concern  . Not on file  Social History Narrative  . Not on file    Outpatient Medications Prior to Visit  Medication Sig Dispense Refill  . acetaminophen (TYLENOL) 325 MG tablet Take 650 mg by mouth every 6 (six) hours as needed for mild pain or moderate pain.     Marland Kitchen clopidogrel (PLAVIX) 75 MG tablet TAKE 1 TABLET (75 MG TOTAL) BY MOUTH DAILY. 30 tablet 2  . diclofenac sodium (VOLTAREN) 1 % GEL Apply 2 g topically 4 (four) times daily. 100 g 1  . INSULIN SYRINGE 1CC/29G (B-D INSULIN SYRINGE) 29G X 1/2" 1 ML MISC 1 application by Does not apply route at bedtime. 30 each 5  . amLODipine (NORVASC) 10 MG tablet Take 1 tablet (10 mg total) by mouth daily. TAKE ONE TABLET BY MOUTH ONCE DAILY (Patient taking differently: Take 10 mg by mouth daily. ) 30 tablet 5  . atorvastatin (LIPITOR) 40 MG tablet Take 1 tablet (40 mg  total) by mouth at bedtime. Any generic statin from [facility] list is okay 30 tablet 5  . carvedilol (COREG) 6.25 MG tablet Take 1 tablet (6.25 mg total) by mouth 2 (two) times daily with a meal. 60 tablet 5  . insulin glargine (LANTUS) 100 UNIT/ML injection Inject 0.4 mLs (40 Units total) into the skin daily. (Patient taking differently: Inject 10-30 Units into the skin at bedtime. Based on blood sugar levels patient injects 25 units 10-25 units for 200 or below and gives 30 units for levels over 200.) 10 mL 5  . isosorbide mononitrate (IMDUR) 30 MG 24 hr tablet Take 1 tablet (30 mg total) by mouth daily. 30 tablet 5  . losartan (COZAAR) 100 MG tablet Take 1 tablet (100 mg total) by mouth daily. 30 tablet 5  . Omega-3 Fatty Acids (FISH OIL) 500 MG CAPS Take 1 capsule (500 mg total) by mouth every morning. 30 capsule 3  . hydrALAZINE (APRESOLINE) 50 MG tablet Take 1 tablet (50 mg total) by mouth 3 (three) times daily for 30 days. 90 tablet 0   No facility-administered medications prior to visit.     Allergies  Allergen Reactions  . Ace Inhibitors Cough    ROS Review of Systems  All other systems reviewed and are negative.     Objective:    Physical Exam  Constitutional: She is oriented to person, place, and time. She appears well-developed and well-nourished.  HENT:  Head: Normocephalic.  Neck: Normal range of motion.  Cardiovascular: Normal rate and regular rhythm.  Pulmonary/Chest: Effort normal and breath sounds normal.  Abdominal: Soft. Bowel sounds are normal. She exhibits distension.  Neurological: She is oriented to person, place, and time.  Psychiatric: She has a normal mood and affect.    BP (!) 143/85 (BP Location: Left Arm, Patient Position: Sitting, Cuff Size: Normal)   Pulse 80   Temp (!) 97.3 F (36.3 C) (Temporal)   Ht 5\' 2"  (1.575 m)   Wt 147 lb 3.2 oz (66.8 kg)   SpO2 95%   BMI 26.92 kg/m  Wt Readings from Last 3 Encounters:  10/02/19 147 lb 3.2 oz (66.8  kg)  05/21/19 148 lb 5.9 oz (67.3 kg)  05/19/19 150 lb 6.4 oz (68.2 kg)     Health Maintenance Due  Topic Date Due  . OPHTHALMOLOGY EXAM  03/15/1978  . MAMMOGRAM  03/15/2018  . Fecal DNA (Cologuard)  03/15/2018  . INFLUENZA VACCINE  07/05/2019    There are no preventive care reminders to display for this patient.  Lab Results  Component Value Date   TSH 2.530 05/21/2019   Lab Results  Component Value Date   WBC 7.5 05/22/2019   HGB 15.2 (H) 05/22/2019   HCT 44.7 05/22/2019   MCV 96.8 05/22/2019   PLT 279 05/22/2019   Lab Results  Component Value Date   NA 136 05/22/2019   K 3.7 05/22/2019   CO2 24 05/22/2019   GLUCOSE 184 (H) 05/22/2019   BUN 17 05/22/2019   CREATININE 0.66 05/22/2019   BILITOT 0.5 05/22/2019   ALKPHOS 51 05/22/2019   AST 9 (L) 05/22/2019   ALT 12 05/22/2019   PROT 6.3 (L) 05/22/2019   ALBUMIN 3.5 05/22/2019   CALCIUM 9.5 05/22/2019   ANIONGAP 7 05/22/2019   Lab Results  Component Value Date   CHOL 180 05/22/2019   Lab Results  Component Value Date   HDL 33 (L) 05/22/2019   Lab Results  Component Value Date   LDLCALC UNABLE TO CALCULATE IF TRIGLYCERIDE OVER 400 mg/dL 16/10/960406/18/2020   Lab Results  Component Value Date   TRIG 456 (H) 05/22/2019   Lab Results  Component Value Date   CHOLHDL 5.5 05/22/2019   Lab Results  Component Value Date   HGBA1C 8.1 (A) 10/02/2019      Assessment & Plan:  [name] was seen today for hypertension and diabetes.  Diagnoses and all orders for this visit:  Type 2 diabetes mellitus with other circulatory complication, with long-term current use of insulin (HCC) Not at goal  6.5 or less on 40 units of lantus at night add glucotrol 10 mg twice daily. Discussed low carbohydrate diet decreasing rice, potatoes, breads, soda and sweets -     HgB A1c 8.1 -     Glucose (CBG)  Essential hypertension Not at goal today 143/85 Carvedilol 12.5mg  twice daily increased from 6.25 twice daily. Start taking  hydralazine 50mg  3 times a day.( Stated was taking 100mg  daily)  Continue the other Bp medications as prescribed and you were taking previously. Goal is 130/80 or less discussed low sodium diet reducing pork, salts, can food and fermented foods.    Mixed hyperlipidemia  Decreasing your fatty foods, red meat, cheese, milk and increase fiber like whole grains and veggies. You can also add a fiber supplement like Metamucil or Benefiber. Atorvorastin 40mg  at bedtime.  Tobacco abuse Each visit discuss cessation due to increased stress and lack of socialization smoking is relaxing.   Other orders/Medication refill  -     amLODipine (NORVASC) 10 MG tablet; Take 1 tablet (10 mg total) by mouth daily. TAKE ONE TABLET BY MOUTH ONCE DAILY -     carvedilol (COREG) 12.5 MG tablet; Take 1 tablet (12.5 mg total) by mouth 2 (two) times daily with a meal. -     losartan (COZAAR) 100 MG tablet; Take 1 tablet (100 mg total) by mouth daily. -     isosorbide mononitrate (IMDUR) 30 MG 24 hr tablet; Take 1 tablet (30 mg total) by mouth daily. -     hydrALAZINE (APRESOLINE) 50 MG tablet; Take 1 tablet (50 mg total) by mouth 3 (three) times daily. -     insulin glargine (LANTUS) 100 UNIT/ML injection; Inject 0.4 mLs (40 Units total) into the skin daily. -     glipiZIDE (GLUCOTROL) 10 MG tablet; Take 1 tablet (10 mg total) by mouth 2 (two) times daily before a meal.     Meds ordered this encounter  Medications  . amLODipine (NORVASC) 10 MG tablet  Sig: Take 1 tablet (10 mg total) by mouth daily. TAKE ONE TABLET BY MOUTH ONCE DAILY    Dispense:  30 tablet    Refill:  3  . carvedilol (COREG) 12.5 MG tablet    Sig: Take 1 tablet (12.5 mg total) by mouth 2 (two) times daily with a meal.    Dispense:  60 tablet    Refill:  3  . losartan (COZAAR) 100 MG tablet    Sig: Take 1 tablet (100 mg total) by mouth daily.    Dispense:  30 tablet    Refill:  3  . isosorbide mononitrate (IMDUR) 30 MG 24 hr tablet    Sig:  Take 1 tablet (30 mg total) by mouth daily.    Dispense:  30 tablet    Refill:  3  . hydrALAZINE (APRESOLINE) 50 MG tablet    Sig: Take 1 tablet (50 mg total) by mouth 3 (three) times daily.    Dispense:  90 tablet    Refill:  0  . insulin glargine (LANTUS) 100 UNIT/ML injection    Sig: Inject 0.4 mLs (40 Units total) into the skin daily.    Dispense:  10 mL    Refill:  3    E11.10  . glipiZIDE (GLUCOTROL) 10 MG tablet    Sig: Take 1 tablet (10 mg total) by mouth 2 (two) times daily before a meal.    Dispense:  60 tablet    Refill:  3  . Omega-3 Fatty Acids (FISH OIL) 500 MG CAPS    Sig: Take 1 capsule (500 mg total) by mouth every morning.    Dispense:  30 capsule    Refill:  3  . atorvastatin (LIPITOR) 40 MG tablet    Sig: Take 1 tablet (40 mg total) by mouth at bedtime. Any generic statin from [facility] list is okay    Dispense:  30 tablet    Refill:  5    Follow-up: Return in about 3 weeks (around 10/23/2019) for HTN .    Grayce Sessions, NP

## 2019-10-02 NOTE — Patient Instructions (Addendum)
Carvedilol 12.5mg  twice daily increased from 6.25 twice daily. Start taking hydralazine 50mg  3 times a day. Continue the other Bp medications as prescribed and you were taking previously   Diabetes Mellitus and Nutrition, Adult When you have diabetes (diabetes mellitus), it is very important to have healthy eating habits because your blood sugar (glucose) levels are greatly affected by what you eat and drink. Eating healthy foods in the appropriate amounts, at about the same times every day, can help you:  Control your blood glucose.  Lower your risk of heart disease.  Improve your blood pressure.  Reach or maintain a healthy weight. Every person with diabetes is different, and each person has different needs for a meal plan. Your health care provider may recommend that you work with a diet and nutrition specialist (dietitian) to make a meal plan that is best for you. Your meal plan may vary depending on factors such as:  The calories you need.  The medicines you take.  Your weight.  Your blood glucose, blood pressure, and cholesterol levels.  Your activity level.  Other health conditions you have, such as heart or kidney disease. How do carbohydrates affect me? Carbohydrates, also called carbs, affect your blood glucose level more than any other type of food. Eating carbs naturally raises the amount of glucose in your blood. Carb counting is a method for keeping track of how many carbs you eat. Counting carbs is important to keep your blood glucose at a healthy level, especially if you use insulin or take certain oral diabetes medicines. It is important to know how many carbs you can safely have in each meal. This is different for every person. Your dietitian can help you calculate how many carbs you should have at each meal and for each snack. Foods that contain carbs include:  Bread, cereal, rice, pasta, and crackers.  Potatoes and corn.  Peas, beans, and lentils.  Milk and  yogurt.  Fruit and juice.  Desserts, such as cakes, cookies, ice cream, and candy. How does alcohol affect me? Alcohol can cause a sudden decrease in blood glucose (hypoglycemia), especially if you use insulin or take certain oral diabetes medicines. Hypoglycemia can be a life-threatening condition. Symptoms of hypoglycemia (sleepiness, dizziness, and confusion) are similar to symptoms of having too much alcohol. If your health care provider says that alcohol is safe for you, follow these guidelines:  Limit alcohol intake to no more than 1 drink per day for nonpregnant women and 2 drinks per day for men. One drink equals 12 oz of beer, 5 oz of wine, or 1 oz of hard liquor.  Do not drink on an empty stomach.  Keep yourself hydrated with water, diet soda, or unsweetened iced tea.  Keep in mind that regular soda, juice, and other mixers may contain a lot of sugar and must be counted as carbs. What are tips for following this plan?  Reading food labels  Start by checking the serving size on the "Nutrition Facts" label of packaged foods and drinks. The amount of calories, carbs, fats, and other nutrients listed on the label is based on one serving of the item. Many items contain more than one serving per package.  Check the total grams (g) of carbs in one serving. You can calculate the number of servings of carbs in one serving by dividing the total carbs by 15. For example, if a food has 30 g of total carbs, it would be equal to 2 servings of carbs.  Check the number of grams (g) of saturated and trans fats in one serving. Choose foods that have low or no amount of these fats.  Check the number of milligrams (mg) of salt (sodium) in one serving. Most people should limit total sodium intake to less than 2,300 mg per day.  Always check the nutrition information of foods labeled as "low-fat" or "nonfat". These foods may be higher in added sugar or refined carbs and should be avoided.  Talk to  your dietitian to identify your daily goals for nutrients listed on the label. Shopping  Avoid buying canned, premade, or processed foods. These foods tend to be high in fat, sodium, and added sugar.  Shop around the outside edge of the grocery store. This includes fresh fruits and vegetables, bulk grains, fresh meats, and fresh dairy. Cooking  Use low-heat cooking methods, such as baking, instead of high-heat cooking methods like deep frying.  Cook using healthy oils, such as olive, canola, or sunflower oil.  Avoid cooking with butter, cream, or high-fat meats. Meal planning  Eat meals and snacks regularly, preferably at the same times every day. Avoid going long periods of time without eating.  Eat foods high in fiber, such as fresh fruits, vegetables, beans, and whole grains. Talk to your dietitian about how many servings of carbs you can eat at each meal.  Eat 4-6 ounces (oz) of lean protein each day, such as lean meat, chicken, fish, eggs, or tofu. One oz of lean protein is equal to: ? 1 oz of meat, chicken, or fish. ? 1 egg. ?  cup of tofu.  Eat some foods each day that contain healthy fats, such as avocado, nuts, seeds, and fish. Lifestyle  Check your blood glucose regularly.  Exercise regularly as told by your health care provider. This may include: ? 150 minutes of moderate-intensity or vigorous-intensity exercise each week. This could be brisk walking, biking, or water aerobics. ? Stretching and doing strength exercises, such as yoga or weightlifting, at least 2 times a week.  Take medicines as told by your health care provider.  Do not use any products that contain nicotine or tobacco, such as cigarettes and e-cigarettes. If you need help quitting, ask your health care provider.  Work with a Veterinary surgeoncounselor or diabetes educator to identify strategies to manage stress and any emotional and social challenges. Questions to ask a health care provider  Do I need to meet with  a diabetes educator?  Do I need to meet with a dietitian?  What number can I call if I have questions?  When are the best times to check my blood glucose? Where to find more information:  American Diabetes Association: diabetes.org  Academy of Nutrition and Dietetics: www.eatright.[employer]  National Institute of Diabetes and Digestive and Kidney Diseases (NIH): CarFlippers.tnwww.niddk.nih.gov Summary  A healthy meal plan will help you control your blood glucose and maintain a healthy lifestyle.  Working with a diet and nutrition specialist (dietitian) can help you make a meal plan that is best for you.  Keep in mind that carbohydrates (carbs) and alcohol have immediate effects on your blood glucose levels. It is important to count carbs and to use alcohol carefully. This information is not intended to replace advice given to you by your health care provider. Make sure you discuss any questions you have with your health care provider. Document Released: 08/17/2005 Document Revised: 11/02/2017 Document Reviewed: 12/25/2016 Elsevier Patient Education  2020 Elsevier Inc.   Hypertension, Adult High blood  pressure (hypertension) is when the force of blood pumping through the arteries is too strong. The arteries are the blood vessels that carry blood from the heart throughout the body. Hypertension forces the heart to work harder to pump blood and may cause arteries to become narrow or stiff. Untreated or uncontrolled hypertension can cause a heart attack, heart failure, a stroke, kidney disease, and other problems. A blood pressure reading consists of a higher number over a lower number. Ideally, your blood pressure should be below 120/80. The first ("top") number is called the systolic pressure. It is a measure of the pressure in your arteries as your heart beats. The second ("bottom") number is called the diastolic pressure. It is a measure of the pressure in your arteries as the heart relaxes. What are the  causes? The exact cause of this condition is not known. There are some conditions that result in or are related to high blood pressure. What increases the risk? Some risk factors for high blood pressure are under your control. The following factors may make you more likely to develop this condition:  Smoking.  Having type 2 diabetes mellitus, high cholesterol, or both.  Not getting enough exercise or physical activity.  Being overweight.  Having too much fat, sugar, calories, or salt (sodium) in your diet.  Drinking too much alcohol. Some risk factors for high blood pressure may be difficult or impossible to change. Some of these factors include:  Having chronic kidney disease.  Having a family history of high blood pressure.  Age. Risk increases with age.  Race. You may be at higher risk if you are African American.  Gender. Men are at higher risk than women before age 54. After age 23, women are at higher risk than men.  Having obstructive sleep apnea.  Stress. What are the signs or symptoms? High blood pressure may not cause symptoms. Very high blood pressure (hypertensive crisis) may cause:  Headache.  Anxiety.  Shortness of breath.  Nosebleed.  Nausea and vomiting.  Vision changes.  Severe chest pain.  Seizures. How is this diagnosed? This condition is diagnosed by measuring your blood pressure while you are seated, with your arm resting on a flat surface, your legs uncrossed, and your feet flat on the floor. The cuff of the blood pressure monitor will be placed directly against the skin of your upper arm at the level of your heart. It should be measured at least twice using the same arm. Certain conditions can cause a difference in blood pressure between your right and left arms. Certain factors can cause blood pressure readings to be lower or higher than normal for a short period of time:  When your blood pressure is higher when you are in a health care  provider's office than when you are at home, this is called white coat hypertension. Most people with this condition do not need medicines.  When your blood pressure is higher at home than when you are in a health care provider's office, this is called masked hypertension. Most people with this condition may need medicines to control blood pressure. If you have a high blood pressure reading during one visit or you have normal blood pressure with other risk factors, you may be asked to:  Return on a different day to have your blood pressure checked again.  Monitor your blood pressure at home for 1 week or longer. If you are diagnosed with hypertension, you may have other blood or imaging tests to  help your health care provider understand your overall risk for other conditions. How is this treated? This condition is treated by making healthy lifestyle changes, such as eating healthy foods, exercising more, and reducing your alcohol intake. Your health care provider may prescribe medicine if lifestyle changes are not enough to get your blood pressure under control, and if:  Your systolic blood pressure is above 130.  Your diastolic blood pressure is above 80. Your personal target blood pressure may vary depending on your medical conditions, your age, and other factors. Follow these instructions at home: Eating and drinking   Eat a diet that is high in fiber and potassium, and low in sodium, added sugar, and fat. An example eating plan is called the DASH (Dietary Approaches to Stop Hypertension) diet. To eat this way: ? Eat plenty of fresh fruits and vegetables. Try to fill one half of your plate at each meal with fruits and vegetables. ? Eat whole grains, such as whole-wheat pasta, brown rice, or whole-grain bread. Fill about one fourth of your plate with whole grains. ? Eat or drink low-fat dairy products, such as skim milk or low-fat yogurt. ? Avoid fatty cuts of meat, processed or cured  meats, and poultry with skin. Fill about one fourth of your plate with lean proteins, such as fish, chicken without skin, beans, eggs, or tofu. ? Avoid pre-made and processed foods. These tend to be higher in sodium, added sugar, and fat.  Reduce your daily sodium intake. Most people with hypertension should eat less than 1,500 mg of sodium a day.  Do not drink alcohol if: ? Your health care provider tells you not to drink. ? You are pregnant, may be pregnant, or are planning to become pregnant.  If you drink alcohol: ? Limit how much you use to:  0-1 drink a day for women.  0-2 drinks a day for men. ? Be aware of how much alcohol is in your drink. In the [place] one drink equals one 12 oz bottle of beer (355 mL), one 5 oz glass of wine (148 mL), or one 1 oz glass of hard liquor (44 mL). Lifestyle   Work with your health care provider to maintain a healthy body weight or to lose weight. Ask what an ideal weight is for you.  Get at least 30 minutes of exercise most days of the week. Activities may include walking, swimming, or biking.  Include exercise to strengthen your muscles (resistance exercise), such as Pilates or lifting weights, as part of your weekly exercise routine. Try to do these types of exercises for 30 minutes at least 3 days a week.  Do not use any products that contain nicotine or tobacco, such as cigarettes, e-cigarettes, and chewing tobacco. If you need help quitting, ask your health care provider.  Monitor your blood pressure at home as told by your health care provider.  Keep all follow-up visits as told by your health care provider. This is important. Medicines  Take over-the-counter and prescription medicines only as told by your health care provider. Follow directions carefully. Blood pressure medicines must be taken as prescribed.  Do not skip doses of blood pressure medicine. Doing this puts you at risk for problems and can make the medicine less  effective.  Ask your health care provider about side effects or reactions to medicines that you should watch for. Contact a health care provider if you:  Think you are having a reaction to a medicine you are taking.  Have headaches that keep coming back (recurring).  Feel dizzy.  Have swelling in your ankles.  Have trouble with your vision. Get help right away if you:  Develop a severe headache or confusion.  Have unusual weakness or numbness.  Feel faint.  Have severe pain in your chest or abdomen.  Vomit repeatedly.  Have trouble breathing. Summary  Hypertension is when the force of blood pumping through your arteries is too strong. If this condition is not controlled, it may put you at risk for serious complications.  Your personal target blood pressure may vary depending on your medical conditions, your age, and other factors. For most people, a normal blood pressure is less than 120/80.  Hypertension is treated with lifestyle changes, medicines, or a combination of both. Lifestyle changes include losing weight, eating a healthy, low-sodium diet, exercising more, and limiting alcohol. This information is not intended to replace advice given to you by your health care provider. Make sure you discuss any questions you have with your health care provider. Document Released: 11/20/2005 Document Revised: 07/31/2018 Document Reviewed: 07/31/2018 Elsevier Patient Education  2020 Elsevier Inc.   DASH Eating Plan DASH stands for "Dietary Approaches to Stop Hypertension." The DASH eating plan is a healthy eating plan that has been shown to reduce high blood pressure (hypertension). It may also reduce your risk for type 2 diabetes, heart disease, and stroke. The DASH eating plan may also help with weight loss. What are tips for following this plan?  General guidelines  Avoid eating more than 2,300 mg (milligrams) of salt (sodium) a day. If you have hypertension, you may need to  reduce your sodium intake to 1,500 mg a day.  Limit alcohol intake to no more than 1 drink a day for nonpregnant women and 2 drinks a day for men. One drink equals 12 oz of beer, 5 oz of wine, or 1 oz of hard liquor.  Work with your health care provider to maintain a healthy body weight or to lose weight. Ask what an ideal weight is for you.  Get at least 30 minutes of exercise that causes your heart to beat faster (aerobic exercise) most days of the week. Activities may include walking, swimming, or biking.  Work with your health care provider or diet and nutrition specialist (dietitian) to adjust your eating plan to your individual calorie needs. Reading food labels   Check food labels for the amount of sodium per serving. Choose foods with less than 5 percent of the Daily Value of sodium. Generally, foods with less than 300 mg of sodium per serving fit into this eating plan.  To find whole grains, look for the word "whole" as the first word in the ingredient list. Shopping  Buy products labeled as "low-sodium" or "no salt added."  Buy fresh foods. Avoid canned foods and premade or frozen meals. Cooking  Avoid adding salt when cooking. Use salt-free seasonings or herbs instead of table salt or sea salt. Check with your health care provider or pharmacist before using salt substitutes.  Do not fry foods. Cook foods using healthy methods such as baking, boiling, grilling, and broiling instead.  Cook with heart-healthy oils, such as olive, canola, soybean, or sunflower oil. Meal planning  Eat a balanced diet that includes: ? 5 or more servings of fruits and vegetables each day. At each meal, try to fill half of your plate with fruits and vegetables. ? Up to 6-8 servings of whole grains each day. ? Less than  6 oz of lean meat, poultry, or fish each day. A 3-oz serving of meat is about the same size as a deck of cards. One egg equals 1 oz. ? 2 servings of low-fat dairy each day. ? A  serving of nuts, seeds, or beans 5 times each week. ? Heart-healthy fats. Healthy fats called Omega-3 fatty acids are found in foods such as flaxseeds and coldwater fish, like sardines, salmon, and mackerel.  Limit how much you eat of the following: ? Canned or prepackaged foods. ? Food that is high in trans fat, such as fried foods. ? Food that is high in saturated fat, such as fatty meat. ? Sweets, desserts, sugary drinks, and other foods with added sugar. ? Full-fat dairy products.  Do not salt foods before eating.  Try to eat at least 2 vegetarian meals each week.  Eat more home-cooked food and less restaurant, buffet, and fast food.  When eating at a restaurant, ask that your food be prepared with less salt or no salt, if possible. What foods are recommended? The items listed may not be a complete list. Talk with your dietitian about what dietary choices are best for you. Grains Whole-grain or whole-wheat bread. Whole-grain or whole-wheat pasta. Brown rice. Orpah Cobb. Bulgur. Whole-grain and low-sodium cereals. Pita bread. Low-fat, low-sodium crackers. Whole-wheat flour tortillas. Vegetables Fresh or frozen vegetables (raw, steamed, roasted, or grilled). Low-sodium or reduced-sodium tomato and vegetable juice. Low-sodium or reduced-sodium tomato sauce and tomato paste. Low-sodium or reduced-sodium canned vegetables. Fruits All fresh, dried, or frozen fruit. Canned fruit in natural juice (without added sugar). Meat and other protein foods Skinless chicken or [place]. Ground chicken or [place]. Pork with fat trimmed off. Fish and seafood. Egg whites. Dried beans, peas, or lentils. Unsalted nuts, nut butters, and seeds. Unsalted canned beans. Lean cuts of beef with fat trimmed off. Low-sodium, lean deli meat. Dairy Low-fat (1%) or fat-free (skim) milk. Fat-free, low-fat, or reduced-fat cheeses. Nonfat, low-sodium ricotta or cottage cheese. Low-fat or nonfat yogurt. Low-fat,  low-sodium cheese. Fats and oils Soft margarine without trans fats. Vegetable oil. Low-fat, reduced-fat, or light mayonnaise and salad dressings (reduced-sodium). Canola, safflower, olive, soybean, and sunflower oils. Avocado. Seasoning and other foods Herbs. Spices. Seasoning mixes without salt. Unsalted popcorn and pretzels. Fat-free sweets. What foods are not recommended? The items listed may not be a complete list. Talk with your dietitian about what dietary choices are best for you. Grains Baked goods made with fat, such as croissants, muffins, or some breads. Dry pasta or rice meal packs. Vegetables Creamed or fried vegetables. Vegetables in a cheese sauce. Regular canned vegetables (not low-sodium or reduced-sodium). Regular canned tomato sauce and paste (not low-sodium or reduced-sodium). Regular tomato and vegetable juice (not low-sodium or reduced-sodium). Rosita Fire. Olives. Fruits Canned fruit in a light or heavy syrup. Fried fruit. Fruit in cream or butter sauce. Meat and other protein foods Fatty cuts of meat. Ribs. Fried meat. Tomasa Blase. Sausage. Bologna and other processed lunch meats. Salami. Fatback. Hotdogs. Bratwurst. Salted nuts and seeds. Canned beans with added salt. Canned or smoked fish. Whole eggs or egg yolks. Chicken or [place] with skin. Dairy Whole or 2% milk, cream, and half-and-half. Whole or full-fat cream cheese. Whole-fat or sweetened yogurt. Full-fat cheese. Nondairy creamers. Whipped toppings. Processed cheese and cheese spreads. Fats and oils Butter. Stick margarine. Lard. Shortening. Ghee. Bacon fat. Tropical oils, such as coconut, palm kernel, or palm oil. Seasoning and other foods Salted popcorn and pretzels. Onion salt, garlic salt,  seasoned salt, table salt, and sea salt. Worcestershire sauce. Tartar sauce. Barbecue sauce. Teriyaki sauce. Soy sauce, including reduced-sodium. Steak sauce. Canned and packaged gravies. Fish sauce. Oyster sauce. Cocktail sauce.  Horseradish that you find on the shelf. Ketchup. Mustard. Meat flavorings and tenderizers. Bouillon cubes. Hot sauce and Tabasco sauce. Premade or packaged marinades. Premade or packaged taco seasonings. Relishes. Regular salad dressings. Where to find more information:  National Heart, Lung, and Blood Institute: PopSteam.is  American Heart Association: www.heart.org Summary  The DASH eating plan is a healthy eating plan that has been shown to reduce high blood pressure (hypertension). It may also reduce your risk for type 2 diabetes, heart disease, and stroke.  With the DASH eating plan, you should limit salt (sodium) intake to 2,300 mg a day. If you have hypertension, you may need to reduce your sodium intake to 1,500 mg a day.  When on the DASH eating plan, aim to eat more fresh fruits and vegetables, whole grains, lean proteins, low-fat dairy, and heart-healthy fats.  Work with your health care provider or diet and nutrition specialist (dietitian) to adjust your eating plan to your individual calorie needs. This information is not intended to replace advice given to you by your health care provider. Make sure you discuss any questions you have with your health care provider. Document Released: 11/09/2011 Document Revised: 11/02/2017 Document Reviewed: 11/13/2016 Elsevier Patient Education  2020 [employer].

## 2019-10-02 NOTE — Addendum Note (Signed)
Addended by: Kerin Perna on: 10/02/2019 10:33 AM   Modules accepted: Orders

## 2019-10-03 LAB — CBC WITH DIFFERENTIAL/PLATELET
Basophils Absolute: 0 10*3/uL (ref 0.0–0.2)
Basos: 0 %
EOS (ABSOLUTE): 0.1 10*3/uL (ref 0.0–0.4)
Eos: 1 %
Hematocrit: 45.8 % (ref 34.0–46.6)
Hemoglobin: 15.7 g/dL (ref 11.1–15.9)
Immature Grans (Abs): 0 10*3/uL (ref 0.0–0.1)
Immature Granulocytes: 0 %
Lymphocytes Absolute: 2.3 10*3/uL (ref 0.7–3.1)
Lymphs: 38 %
MCH: 32.4 pg (ref 26.6–33.0)
MCHC: 34.3 g/dL (ref 31.5–35.7)
MCV: 95 fL (ref 79–97)
Monocytes Absolute: 0.5 10*3/uL (ref 0.1–0.9)
Monocytes: 8 %
Neutrophils Absolute: 3.1 10*3/uL (ref 1.4–7.0)
Neutrophils: 53 %
Platelets: 373 10*3/uL (ref 150–450)
RBC: 4.84 x10E6/uL (ref 3.77–5.28)
RDW: 12.3 % (ref 11.7–15.4)
WBC: 5.9 10*3/uL (ref 3.4–10.8)

## 2019-10-03 LAB — CMP14+EGFR
ALT: 12 IU/L (ref 0–32)
AST: 10 IU/L (ref 0–40)
Albumin/Globulin Ratio: 1.6 (ref 1.2–2.2)
Albumin: 4.4 g/dL (ref 3.8–4.9)
Alkaline Phosphatase: 56 IU/L (ref 39–117)
BUN/Creatinine Ratio: 19 (ref 9–23)
BUN: 13 mg/dL (ref 6–24)
Bilirubin Total: 0.5 mg/dL (ref 0.0–1.2)
CO2: 21 mmol/L (ref 20–29)
Calcium: 10 mg/dL (ref 8.7–10.2)
Chloride: 105 mmol/L (ref 96–106)
Creatinine, Ser: 0.67 mg/dL (ref 0.57–1.00)
GFR calc Af Amer: 118 mL/min/{1.73_m2} (ref >59)
GFR calc non Af Amer: 102 mL/min/{1.73_m2} (ref >59)
Globulin, Total: 2.8 g/dL (ref 1.5–4.5)
Glucose: 192 mg/dL — ABNORMAL HIGH (ref 65–99)
Potassium: 4 mmol/L (ref 3.5–5.2)
Sodium: 138 mmol/L (ref 134–144)
Total Protein: 7.2 g/dL (ref 6.0–8.5)

## 2019-10-03 LAB — LIPID PANEL
Chol/HDL Ratio: 5.1 ratio — ABNORMAL HIGH (ref 0.0–4.4)
Cholesterol, Total: 192 mg/dL (ref 100–199)
HDL: 38 mg/dL — ABNORMAL LOW (ref >39)
LDL Chol Calc (NIH): 135 mg/dL — ABNORMAL HIGH (ref 0–99)
Triglycerides: 106 mg/dL (ref 0–149)
VLDL Cholesterol Cal: 19 mg/dL (ref 5–40)

## 2019-10-06 ENCOUNTER — Telehealth (INDEPENDENT_AMBULATORY_CARE_PROVIDER_SITE_OTHER): Payer: Self-pay

## 2019-10-06 NOTE — Telephone Encounter (Signed)
Patient is aware that LDL is not in range and being elevated can lead to stroke or heart attack. Advised patient to decrease fatty foods, red meat, cheese and milk; increase fiber with whole grains, veggies or fiber supplement like metamucil or benefiber. Patient expressed understanding of results and did not have any questions. Nat Christen, CMA

## 2019-10-06 NOTE — Telephone Encounter (Signed)
-----   Message from Kerin Perna, NP sent at 10/05/2019  9:45 PM EST ----- Your LDL is not in range. Your LDL is the bad cholesterol that can lead to heart attack and stroke. To lower your number you can decrease your fatty foods, red meat, cheese, milk and increase fiber like whole grains and veggies. You can also add a fiber supplement like Metamucil or Benefiber. Continue atorvastatin at bedtime

## 2019-10-10 ENCOUNTER — Emergency Department (HOSPITAL_COMMUNITY)
Admission: EM | Admit: 2019-10-10 | Discharge: 2019-10-11 | Disposition: A | Discharge status: Home / Self Care | Payer: Self-pay | Attending: Emergency Medicine | Admitting: Emergency Medicine

## 2019-10-10 ENCOUNTER — Other Ambulatory Visit: Payer: Self-pay

## 2019-10-10 ENCOUNTER — Emergency Department (HOSPITAL_COMMUNITY): Payer: Self-pay

## 2019-10-10 ENCOUNTER — Encounter (HOSPITAL_COMMUNITY): Payer: Self-pay

## 2019-10-10 DIAGNOSIS — Z794 Long term (current) use of insulin: Secondary | ICD-10-CM | POA: Insufficient documentation

## 2019-10-10 DIAGNOSIS — T1490XA Injury, unspecified, initial encounter: Secondary | ICD-10-CM

## 2019-10-10 DIAGNOSIS — F17210 Nicotine dependence, cigarettes, uncomplicated: Secondary | ICD-10-CM | POA: Insufficient documentation

## 2019-10-10 DIAGNOSIS — Y999 Unspecified external cause status: Secondary | ICD-10-CM | POA: Insufficient documentation

## 2019-10-10 DIAGNOSIS — E119 Type 2 diabetes mellitus without complications: Secondary | ICD-10-CM | POA: Insufficient documentation

## 2019-10-10 DIAGNOSIS — Y929 Unspecified place or not applicable: Secondary | ICD-10-CM | POA: Insufficient documentation

## 2019-10-10 DIAGNOSIS — I10 Essential (primary) hypertension: Secondary | ICD-10-CM | POA: Insufficient documentation

## 2019-10-10 DIAGNOSIS — S61412A Laceration without foreign body of left hand, initial encounter: Secondary | ICD-10-CM | POA: Insufficient documentation

## 2019-10-10 DIAGNOSIS — D500 Iron deficiency anemia secondary to blood loss (chronic): Secondary | ICD-10-CM | POA: Insufficient documentation

## 2019-10-10 DIAGNOSIS — Z79899 Other long term (current) drug therapy: Secondary | ICD-10-CM | POA: Insufficient documentation

## 2019-10-10 DIAGNOSIS — W260XXA Contact with knife, initial encounter: Secondary | ICD-10-CM | POA: Insufficient documentation

## 2019-10-10 DIAGNOSIS — Y939 Activity, unspecified: Secondary | ICD-10-CM | POA: Insufficient documentation

## 2019-10-10 LAB — CBC WITH DIFFERENTIAL/PLATELET
Abs Immature Granulocytes: 0.03 10*3/uL (ref 0.00–0.07)
Basophils Absolute: 0 10*3/uL (ref 0.0–0.1)
Basophils Relative: 0 %
Eosinophils Absolute: 0.1 10*3/uL (ref 0.0–0.5)
Eosinophils Relative: 1 %
HCT: 35.5 % — ABNORMAL LOW (ref 36.0–46.0)
Hemoglobin: 11.5 g/dL — ABNORMAL LOW (ref 12.0–15.0)
Immature Granulocytes: 0 %
Lymphocytes Relative: 27 %
Lymphs Abs: 2.6 10*3/uL (ref 0.7–4.0)
MCH: 32.3 pg (ref 26.0–34.0)
MCHC: 32.4 g/dL (ref 30.0–36.0)
MCV: 99.7 fL (ref 80.0–100.0)
Monocytes Absolute: 0.8 10*3/uL (ref 0.1–1.0)
Monocytes Relative: 8 %
Neutro Abs: 6.2 10*3/uL (ref 1.7–7.7)
Neutrophils Relative %: 64 %
Platelets: 149 10*3/uL — ABNORMAL LOW (ref 150–400)
RBC: 3.56 MIL/uL — ABNORMAL LOW (ref 3.87–5.11)
RDW: 12.7 % (ref 11.5–15.5)
WBC: 9.7 10*3/uL (ref 4.0–10.5)
nRBC: 0 % (ref 0.0–0.2)

## 2019-10-10 MED ORDER — POVIDONE-IODINE 10 % EX SOLN
CUTANEOUS | Status: AC
  Administered 2019-10-10: 1
  Filled 2019-10-10: qty 30

## 2019-10-10 MED ORDER — LIDOCAINE HCL (PF) 1 % IJ SOLN
30.0000 mL | Freq: Once | INTRAMUSCULAR | Status: AC
  Administered 2019-10-10: 21:00:00 30 mL via INTRADERMAL
  Filled 2019-10-10: qty 30

## 2019-10-10 MED ORDER — BACITRACIN-NEOMYCIN-POLYMYXIN 400-5-5000 EX OINT
TOPICAL_OINTMENT | Freq: Once | CUTANEOUS | Status: AC
  Administered 2019-10-11: via TOPICAL
  Filled 2019-10-10: qty 1

## 2019-10-10 MED ORDER — SODIUM CHLORIDE 0.9 % IV BOLUS
1000.0000 mL | Freq: Once | INTRAVENOUS | Status: AC
  Administered 2019-10-11: 1000 mL via INTRAVENOUS

## 2019-10-10 MED ORDER — ACETAMINOPHEN 500 MG PO TABS
1000.0000 mg | ORAL_TABLET | Freq: Once | ORAL | Status: AC
  Administered 2019-10-10: 1000 mg via ORAL
  Filled 2019-10-10: qty 2

## 2019-10-10 MED ORDER — ONDANSETRON HCL 4 MG/2ML IJ SOLN
4.0000 mg | Freq: Once | INTRAMUSCULAR | Status: DC
  Filled 2019-10-10: qty 2

## 2019-10-10 MED ORDER — SODIUM CHLORIDE 0.9 % IV BOLUS
500.0000 mL | Freq: Once | INTRAVENOUS | Status: AC
  Administered 2019-10-10: 22:00:00 500 mL via INTRAVENOUS

## 2019-10-10 MED ORDER — CEPHALEXIN 500 MG PO CAPS: 500.0000 mg | ORAL_CAPSULE | Freq: Two times a day (BID) | ORAL | 0 refills | Status: DC

## 2019-10-10 NOTE — ED Triage Notes (Signed)
Pt states she was cutting food for a stew and cut the palm of her hand in 2 places on the knife.  Pt is on plavix and has had substantial bleeding per ems.  Wounds continue to bleed despite pressure dressings in place.  EMS gave 500 IV cc of NS

## 2019-10-10 NOTE — Discharge Instructions (Signed)
It is important for you to see the hand specialist on Monday for reassessment. Watch for signs of infection such as pus draining, spreading redness, fevers, vomiting. Take antibiotics as directed. Keep wound clean, shower as normal however no baths hot tubs or pools. For bleeding hold constant pressure.
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
## 2019-10-11 LAB — CBC
HCT: 31.4 % — ABNORMAL LOW (ref 36.0–46.0)
Hemoglobin: 10.3 g/dL — ABNORMAL LOW (ref 12.0–15.0)
MCH: 32.9 pg (ref 26.0–34.0)
MCHC: 32.8 g/dL (ref 30.0–36.0)
MCV: 100.3 fL — ABNORMAL HIGH (ref 80.0–100.0)
Platelets: 293 10*3/uL (ref 150–400)
RBC: 3.13 MIL/uL — ABNORMAL LOW (ref 3.87–5.11)
RDW: 12.7 % (ref 11.5–15.5)
WBC: 9.4 10*3/uL (ref 4.0–10.5)
nRBC: 0 % (ref 0.0–0.2)

## 2019-10-11 MED ORDER — SODIUM CHLORIDE 0.9 % IV BOLUS
1000.0000 mL | Freq: Once | INTRAVENOUS | Status: AC
  Administered 2019-10-11: 03:00:00 1000 mL via INTRAVENOUS

## 2019-10-11 MED ORDER — ACETAMINOPHEN 500 MG PO TABS
1000.0000 mg | ORAL_TABLET | Freq: Once | ORAL | Status: AC
  Administered 2019-10-11: 1000 mg via ORAL
  Filled 2019-10-11: qty 2

## 2019-10-11 NOTE — ED Provider Notes (Signed)
Patient was left at change of shift to check repeat CBC see and to determine when she can be discharged home.  CBC    Component Value Date/Time   WBC 9.4 10/11/2019 0053   RBC 3.13 (L) 10/11/2019 0053   HGB 10.3 (L) 10/11/2019 0053   HGB 15.7 10/02/2019 0932   HCT 31.4 (L) 10/11/2019 0053   HCT 45.8 10/02/2019 0932   PLT 293 10/11/2019 0053   PLT 373 10/02/2019 0932   MCV 100.3 (H) 10/11/2019 0053   MCV 95 10/02/2019 0932   MCH 32.9 10/11/2019 0053   MCHC 32.8 10/11/2019 0053   RDW 12.7 10/11/2019 0053   RDW 12.3 10/02/2019 0932   LYMPHSABS 2.6 10/10/2019 2157   LYMPHSABS 2.3 10/02/2019 0932   MONOABS 0.8 10/10/2019 2157   EOSABS 0.1 10/10/2019 2157   EOSABS 0.1 10/02/2019 0932   BASOSABS 0.0 10/10/2019 2157   BASOSABS 0.0 10/02/2019 0932    These were done around 3 AM Orthostatic VS for the past 24 hrs:  BP- Sitting Pulse- Sitting BP- Standing at 0 minutes Pulse- Standing at 0 minutes  10/11/19 0300 119/78 107 (!) 85/63 123    3:05 AM patient has already received 2500 cc of fluids total from EMS in the ED.  She was given 1 more liter.  She does have a significant drop in her hemoglobin, in June her hemoglobin was around 15, her initial one tonight was 32 and now it is 10.  Although she has had a significant drop she has not had enough to require transfusion.  She was given an additional liter of normal saline.  We discussed taking iron pills for a few weeks.  3:30 AM patient is resting comfortably.  She states she has had some urinary output.  We discussed the reason she was still in the ED.  4:45 AM patient's blood pressure was 127/88 standing after the third liter in the ED or total of 3500 cc of normal saline.  She was discharged home.   Rolland Porter, MD 10/11/19 [phone]

## 2019-10-11 NOTE — ED Notes (Signed)
Pt resting, states her hand is aching, requesting tylenol for same.

## 2019-10-11 NOTE — ED Notes (Signed)
Pt given ice water and crackers

## 2019-10-14 ENCOUNTER — Other Ambulatory Visit: Payer: Self-pay | Admitting: Orthopedic Surgery

## 2019-10-14 MED FILL — ?ATORVASTATIN 40MG TABLET: 40 | 30 days supply | Qty: 30 | Fill #0

## 2019-10-15 ENCOUNTER — Other Ambulatory Visit: Payer: Self-pay

## 2019-10-15 ENCOUNTER — Encounter (HOSPITAL_BASED_OUTPATIENT_CLINIC_OR_DEPARTMENT_OTHER): Payer: Self-pay | Admitting: *Deleted

## 2019-10-16 MED FILL — CLOPIDOGREL 75 MG TABLET: 75 | 30 days supply | Qty: 30 | Fill #0

## 2019-10-16 MED FILL — ?ATORVASTATIN 40MG TABLET: 40 | 30 days supply | Qty: 30 | Fill #5

## 2019-10-16 MED FILL — ISOSORBIDE MN ER 30 MG TAB: 30 | 30 days supply | Qty: 30 | Fill #5

## 2019-10-16 MED FILL — LOSARTAN POTASSIUM 100 MG T: 100 | 30 days supply | Qty: 30 | Fill #0

## 2019-10-16 MED FILL — ?AMLODIPINE BESYLATE 10 MG: 10 | 30 days supply | Qty: 30 | Fill #2

## 2019-10-17 ENCOUNTER — Other Ambulatory Visit (HOSPITAL_COMMUNITY)
Admission: RE | Admit: 2019-10-17 | Discharge: 2019-10-17 | Disposition: A | Discharge status: Home / Self Care | Payer: Self-pay | Source: Ambulatory Visit | Attending: Orthopedic Surgery | Admitting: Orthopedic Surgery

## 2019-10-17 DIAGNOSIS — Z01812 Encounter for preprocedural laboratory examination: Secondary | ICD-10-CM | POA: Insufficient documentation

## 2019-10-17 DIAGNOSIS — Z20828 Contact with and (suspected) exposure to other viral communicable diseases: Secondary | ICD-10-CM | POA: Insufficient documentation

## 2019-10-18 LAB — CUP PACEART REMOTE DEVICE CHECK
Date Time Interrogation Session: 20201114145542
Implantable Pulse Generator Implant Date: 20180725

## 2019-10-19 LAB — NOVEL CORONAVIRUS, NAA (HOSP ORDER, SEND-OUT TO REF LAB; TAT 18-24 HRS): SARS-CoV-2, NAA: NOT DETECTED

## 2019-10-20 ENCOUNTER — Ambulatory Visit (INDEPENDENT_AMBULATORY_CARE_PROVIDER_SITE_OTHER): Payer: Self-pay | Admitting: Primary Care

## 2019-10-20 ENCOUNTER — Ambulatory Visit (INDEPENDENT_AMBULATORY_CARE_PROVIDER_SITE_OTHER): Payer: Self-pay | Admitting: *Deleted

## 2019-10-20 DIAGNOSIS — I639 Cerebral infarction, unspecified: Secondary | ICD-10-CM

## 2019-10-21 ENCOUNTER — Other Ambulatory Visit: Payer: Self-pay

## 2019-10-21 ENCOUNTER — Encounter (HOSPITAL_BASED_OUTPATIENT_CLINIC_OR_DEPARTMENT_OTHER)
Admission: RE | Disposition: A | Discharge status: Home / Self Care | Payer: Self-pay | Source: Home / Self Care | Attending: Orthopedic Surgery

## 2019-10-21 ENCOUNTER — Ambulatory Visit (HOSPITAL_BASED_OUTPATIENT_CLINIC_OR_DEPARTMENT_OTHER): Payer: Self-pay | Admitting: Anesthesiology

## 2019-10-21 ENCOUNTER — Encounter (HOSPITAL_BASED_OUTPATIENT_CLINIC_OR_DEPARTMENT_OTHER): Payer: Self-pay

## 2019-10-21 ENCOUNTER — Ambulatory Visit (HOSPITAL_BASED_OUTPATIENT_CLINIC_OR_DEPARTMENT_OTHER)
Admission: RE | Admit: 2019-10-21 | Discharge: 2019-10-21 | Disposition: A | Discharge status: Home / Self Care | Payer: Self-pay | Attending: Orthopedic Surgery | Admitting: Orthopedic Surgery

## 2019-10-21 DIAGNOSIS — S64491A Injury of digital nerve of left index finger, initial encounter: Secondary | ICD-10-CM | POA: Insufficient documentation

## 2019-10-21 DIAGNOSIS — Y9389 Activity, other specified: Secondary | ICD-10-CM | POA: Insufficient documentation

## 2019-10-21 DIAGNOSIS — S66121A Laceration of flexor muscle, fascia and tendon of left index finger at wrist and hand level, initial encounter: Secondary | ICD-10-CM | POA: Insufficient documentation

## 2019-10-21 DIAGNOSIS — Z794 Long term (current) use of insulin: Secondary | ICD-10-CM | POA: Insufficient documentation

## 2019-10-21 DIAGNOSIS — I10 Essential (primary) hypertension: Secondary | ICD-10-CM | POA: Insufficient documentation

## 2019-10-21 DIAGNOSIS — Z79899 Other long term (current) drug therapy: Secondary | ICD-10-CM | POA: Insufficient documentation

## 2019-10-21 DIAGNOSIS — Z888 Allergy status to other drugs, medicaments and biological substances status: Secondary | ICD-10-CM | POA: Insufficient documentation

## 2019-10-21 DIAGNOSIS — W260XXA Contact with knife, initial encounter: Secondary | ICD-10-CM | POA: Insufficient documentation

## 2019-10-21 DIAGNOSIS — Z8673 Personal history of transient ischemic attack (TIA), and cerebral infarction without residual deficits: Secondary | ICD-10-CM | POA: Insufficient documentation

## 2019-10-21 DIAGNOSIS — F17210 Nicotine dependence, cigarettes, uncomplicated: Secondary | ICD-10-CM | POA: Insufficient documentation

## 2019-10-21 DIAGNOSIS — E119 Type 2 diabetes mellitus without complications: Secondary | ICD-10-CM | POA: Insufficient documentation

## 2019-10-21 DIAGNOSIS — E041 Nontoxic single thyroid nodule: Secondary | ICD-10-CM | POA: Insufficient documentation

## 2019-10-21 DIAGNOSIS — S65812A Laceration of other blood vessels at wrist and hand level of left arm, initial encounter: Secondary | ICD-10-CM | POA: Insufficient documentation

## 2019-10-21 DIAGNOSIS — S61412A Laceration without foreign body of left hand, initial encounter: Secondary | ICD-10-CM | POA: Insufficient documentation

## 2019-10-21 DIAGNOSIS — Z791 Long term (current) use of non-steroidal anti-inflammatories (NSAID): Secondary | ICD-10-CM | POA: Insufficient documentation

## 2019-10-21 DIAGNOSIS — R519 Headache, unspecified: Secondary | ICD-10-CM | POA: Insufficient documentation

## 2019-10-21 DIAGNOSIS — Z823 Family history of stroke: Secondary | ICD-10-CM | POA: Insufficient documentation

## 2019-10-21 DIAGNOSIS — D649 Anemia, unspecified: Secondary | ICD-10-CM | POA: Insufficient documentation

## 2019-10-21 HISTORY — PX: NERVE, TENDON AND ARTERY REPAIR: SHX5695

## 2019-10-21 LAB — GLUCOSE, CAPILLARY: Glucose-Capillary: 149 mg/dL — ABNORMAL HIGH (ref 70–99)

## 2019-10-21 SURGERY — NERVE, TENDON AND ARTERY REPAIR
Anesthesia: General | Site: Hand | Laterality: Left

## 2019-10-21 MED ORDER — OXYCODONE HCL 5 MG/5ML PO SOLN: 5.0000 mg | Freq: Once | ORAL | Status: DC | PRN

## 2019-10-21 MED ORDER — CEFAZOLIN SODIUM-DEXTROSE 2-4 GM/100ML-% IV SOLN
INTRAVENOUS | Status: AC
  Filled 2019-10-21: qty 100

## 2019-10-21 MED ORDER — ONDANSETRON HCL 4 MG/2ML IJ SOLN: 4.0000 mg | Freq: Four times a day (QID) | INTRAMUSCULAR | Status: DC | PRN

## 2019-10-21 MED ORDER — EPHEDRINE SULFATE 50 MG/ML IJ SOLN
INTRAMUSCULAR | Status: DC | PRN
  Administered 2019-10-21: 15 mg via INTRAVENOUS

## 2019-10-21 MED ORDER — PROPOFOL 10 MG/ML IV BOLUS
INTRAVENOUS | Status: AC
  Filled 2019-10-21: qty 20

## 2019-10-21 MED ORDER — FENTANYL CITRATE (PF) 100 MCG/2ML IJ SOLN
INTRAMUSCULAR | Status: AC
  Filled 2019-10-21: qty 2

## 2019-10-21 MED ORDER — CEFAZOLIN SODIUM-DEXTROSE 2-4 GM/100ML-% IV SOLN: 2.0000 g | INTRAVENOUS | Status: DC

## 2019-10-21 MED ORDER — MIDAZOLAM HCL 2 MG/2ML IJ SOLN
INTRAMUSCULAR | Status: AC
  Filled 2019-10-21: qty 2

## 2019-10-21 MED ORDER — FENTANYL CITRATE (PF) 100 MCG/2ML IJ SOLN: 25.0000 ug | INTRAMUSCULAR | Status: DC | PRN

## 2019-10-21 MED ORDER — ONDANSETRON 4 MG PO TBDP
ORAL_TABLET | ORAL | Status: AC
  Filled 2019-10-21: qty 1

## 2019-10-21 MED ORDER — LACTATED RINGERS IV SOLN
INTRAVENOUS | Status: DC
  Administered 2019-10-21 (×2): via INTRAVENOUS

## 2019-10-21 MED ORDER — PHENYLEPHRINE HCL (PRESSORS) 10 MG/ML IV SOLN
INTRAVENOUS | Status: DC | PRN
  Administered 2019-10-21: 40 ug via INTRAVENOUS

## 2019-10-21 MED ORDER — THROMBIN 5000 UNITS EX SOLR
CUTANEOUS | Status: AC
  Filled 2019-10-21: qty 5000

## 2019-10-21 MED ORDER — ONDANSETRON 4 MG PO TBDP
4.0000 mg | ORAL_TABLET | Freq: Once | ORAL | Status: AC
  Administered 2019-10-21: 4 mg via ORAL

## 2019-10-21 MED ORDER — CHLORHEXIDINE GLUCONATE 4 % EX LIQD: 60.0000 mL | Freq: Once | CUTANEOUS | Status: DC

## 2019-10-21 MED ORDER — BUPIVACAINE HCL (PF) 0.25 % IJ SOLN
INTRAMUSCULAR | Status: DC | PRN
  Administered 2019-10-21: 10 mL

## 2019-10-21 MED ORDER — HYDROCODONE-ACETAMINOPHEN 5-325 MG PO TABS: ORAL_TABLET | ORAL | 0 refills | Status: DC

## 2019-10-21 MED ORDER — MIDAZOLAM HCL 2 MG/2ML IJ SOLN
1.0000 mg | INTRAMUSCULAR | Status: DC | PRN
  Administered 2019-10-21: 2 mg via INTRAVENOUS

## 2019-10-21 MED ORDER — ONDANSETRON HCL 4 MG/2ML IJ SOLN
INTRAMUSCULAR | Status: DC | PRN
  Administered 2019-10-21: 4 mg via INTRAVENOUS

## 2019-10-21 MED ORDER — FENTANYL CITRATE (PF) 100 MCG/2ML IJ SOLN
50.0000 ug | INTRAMUSCULAR | Status: DC | PRN
  Administered 2019-10-21: 15:00:00 50 ug via INTRAVENOUS

## 2019-10-21 MED ORDER — LIDOCAINE HCL (CARDIAC) PF 100 MG/5ML IV SOSY
PREFILLED_SYRINGE | INTRAVENOUS | Status: DC | PRN
  Administered 2019-10-21: 50 mg via INTRAVENOUS

## 2019-10-21 MED ORDER — ACETAMINOPHEN 500 MG PO TABS
1000.0000 mg | ORAL_TABLET | Freq: Once | ORAL | Status: AC
  Administered 2019-10-21: 1000 mg via ORAL

## 2019-10-21 MED ORDER — OXYCODONE HCL 5 MG PO TABS: 5.0000 mg | ORAL_TABLET | Freq: Once | ORAL | Status: DC | PRN

## 2019-10-21 MED ORDER — DEXAMETHASONE SODIUM PHOSPHATE 10 MG/ML IJ SOLN
INTRAMUSCULAR | Status: DC | PRN
  Administered 2019-10-21: 8 mg via INTRAVENOUS

## 2019-10-21 MED ORDER — PROPOFOL 10 MG/ML IV BOLUS
INTRAVENOUS | Status: DC | PRN
  Administered 2019-10-21: 150 mg via INTRAVENOUS

## 2019-10-21 MED ORDER — ACETAMINOPHEN 500 MG PO TABS
ORAL_TABLET | ORAL | Status: AC
  Filled 2019-10-21: qty 2

## 2019-10-21 SURGICAL SUPPLY — 56 items
BAG DECANTER FOR FLEXI CONT (MISCELLANEOUS) IMPLANT
BLADE MINI RND TIP GREEN BEAV (BLADE) IMPLANT
BLADE SURG 15 STRL LF DISP TIS (BLADE) ×4 IMPLANT
BLADE SURG 15 STRL SS (BLADE) ×4
BNDG ELASTIC 3X5.8 VLCR STR LF (GAUZE/BANDAGES/DRESSINGS) ×4 IMPLANT
BNDG ESMARK 4X9 LF (GAUZE/BANDAGES/DRESSINGS) ×4 IMPLANT
BNDG GAUZE ELAST 4 BULKY (GAUZE/BANDAGES/DRESSINGS) ×4 IMPLANT
CHLORAPREP W/TINT 26 (MISCELLANEOUS) ×4 IMPLANT
CORD BIPOLAR FORCEPS 12FT (ELECTRODE) ×4 IMPLANT
COVER BACK TABLE REUSABLE LG (DRAPES) ×4 IMPLANT
COVER MAYO STAND REUSABLE (DRAPES) ×4 IMPLANT
COVER WAND RF STERILE (DRAPES) IMPLANT
CUFF TOURN SGL QUICK 18X4 (TOURNIQUET CUFF) ×4 IMPLANT
DECANTER SPIKE VIAL GLASS SM (MISCELLANEOUS) IMPLANT
DRAPE EXTREMITY T 121X128X90 (DISPOSABLE) ×4 IMPLANT
DRAPE SURG 17X23 STRL (DRAPES) ×4 IMPLANT
GAUZE SPONGE 4X4 12PLY STRL (GAUZE/BANDAGES/DRESSINGS) ×4 IMPLANT
GAUZE XEROFORM 1X8 LF (GAUZE/BANDAGES/DRESSINGS) ×4 IMPLANT
GLOVE BIO SURGEON STRL SZ7.5 (GLOVE) ×8 IMPLANT
GLOVE BIOGEL PI IND STRL 6.5 (GLOVE) ×4 IMPLANT
GLOVE BIOGEL PI IND STRL 8 (GLOVE) ×4 IMPLANT
GLOVE BIOGEL PI INDICATOR 6.5 (GLOVE) ×4
GLOVE BIOGEL PI INDICATOR 8 (GLOVE) ×4
GOWN STRL REUS W/ TWL LRG LVL3 (GOWN DISPOSABLE) ×4 IMPLANT
GOWN STRL REUS W/TWL LRG LVL3 (GOWN DISPOSABLE) ×4
LOOP VESSEL MAXI BLUE (MISCELLANEOUS) ×4 IMPLANT
NDL SAFETY ECLIPSE 18X1.5 (NEEDLE) IMPLANT
NEEDLE HYPO 18GX1.5 SHARP (NEEDLE)
NEEDLE HYPO 25X1 1.5 SAFETY (NEEDLE) ×4 IMPLANT
NS IRRIG 1000ML POUR BTL (IV SOLUTION) ×4 IMPLANT
PACK BASIN DAY SURGERY FS (CUSTOM PROCEDURE TRAY) ×4 IMPLANT
PAD CAST 3X4 CTTN HI CHSV (CAST SUPPLIES) ×2 IMPLANT
PAD CAST 4YDX4 CTTN HI CHSV (CAST SUPPLIES) IMPLANT
PADDING CAST ABS 4INX4YD NS (CAST SUPPLIES) ×2
PADDING CAST ABS COTTON 4X4 ST (CAST SUPPLIES) ×2 IMPLANT
PADDING CAST COTTON 3X4 STRL (CAST SUPPLIES) ×2
PADDING CAST COTTON 4X4 STRL (CAST SUPPLIES)
SLEEVE SCD COMPRESS KNEE MED (MISCELLANEOUS) ×4 IMPLANT
SPEAR EYE SURG WECK-CEL (MISCELLANEOUS) ×4 IMPLANT
SPLINT PLASTER CAST XFAST 3X15 (CAST SUPPLIES) ×30 IMPLANT
SPLINT PLASTER XTRA FASTSET 3X (CAST SUPPLIES) ×30
STOCKINETTE 4X48 STRL (DRAPES) ×4 IMPLANT
SUT ETHIBOND 3-0 V-5 (SUTURE) IMPLANT
SUT ETHILON 4 0 PS 2 18 (SUTURE) ×12 IMPLANT
SUT FIBERWIRE 4-0 18 TAPR NDL (SUTURE)
SUT MERSILENE 4 0 P 3 (SUTURE) ×4 IMPLANT
SUT NYLON 9 0 VRM6 (SUTURE) ×8 IMPLANT
SUT PROLENE 6 0 P 1 18 (SUTURE) ×4 IMPLANT
SUT SILK 4 0 PS 2 (SUTURE) ×4 IMPLANT
SUT SUPRAMID 4-0 (SUTURE) ×4 IMPLANT
SUT VICRYL 4-0 PS2 18IN ABS (SUTURE) IMPLANT
SUTURE FIBERWR 4-0 18 TAPR NDL (SUTURE) IMPLANT
SYR BULB 3OZ (MISCELLANEOUS) ×4 IMPLANT
SYR CONTROL 10ML LL (SYRINGE) ×4 IMPLANT
TOWEL GREEN STERILE FF (TOWEL DISPOSABLE) ×8 IMPLANT
UNDERPAD 30X36 HEAVY ABSORB (UNDERPADS AND DIAPERS) ×4 IMPLANT
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
## 2019-10-21 NOTE — Transfer of Care (Signed)
Immediate Anesthesia Transfer of Care Note  Patient: Tammy Bauer  Procedure(s) Performed: WOUND EXPLORATION LEFT HAND WITH REPAIR TENDON, ARTERY AND NERVE  (Left Hand)  Patient Location: PACU  Anesthesia Type:General  Level of Consciousness: sedated  Airway & Oxygen Therapy: Patient Spontanous Breathing and Patient connected to face mask oxygen  Post-op Assessment: Report given to RN and Post -op Vital signs reviewed and stable  Post vital signs: Reviewed and stable  Last Vitals:  Vitals Value Taken Time  BP 101/58 10/21/19 1635  Temp    Pulse 78 10/21/19 1636  Resp 16 10/21/19 1636  SpO2 99 % 10/21/19 1636  Vitals shown include unvalidated device data.  Last Pain:  Vitals:   10/21/19 1251  TempSrc: Oral  PainSc: 2          Complications: No apparent anesthesia complications

## 2019-10-21 NOTE — Discharge Instructions (Addendum)

## 2019-10-21 NOTE — Anesthesia Postprocedure Evaluation (Signed)
Anesthesia Post Note  Patient: Tammy Bauer  Procedure(s) Performed: WOUND EXPLORATION LEFT HAND WITH REPAIR TENDON, ARTERY AND NERVE  (Left Hand)     Patient location during evaluation: PACU Anesthesia Type: General Level of consciousness: awake and alert Pain management: pain level controlled Vital Signs Assessment: post-procedure vital signs reviewed and stable Respiratory status: spontaneous breathing, nonlabored ventilation, respiratory function stable and patient connected to nasal cannula oxygen Cardiovascular status: blood pressure returned to baseline and stable Postop Assessment: no apparent nausea or vomiting Anesthetic complications: no    Last Vitals:  Vitals:   10/21/19 1715 10/21/19 1732  BP: 117/77 124/85  Pulse: 73 76  Resp: 18 18  Temp:    SpO2: 99% 96%    Last Pain:  Vitals:   10/21/19 1732  TempSrc:   PainSc: 3                  Torina Ey

## 2019-10-21 NOTE — Op Note (Signed)
I assisted Surgeon(s) and Role:    * Leanora Cover, MD - Primary    Daryll Brod, MD - Assisting on the Procedure(s): WOUND EXPLORATION LEFT HAND WITH REPAIR TENDON, ARTERY AND NERVE  on 10/21/2019.  I provided assistance on this case as follows: Set up: Approach to the ulnar hand wound.  Exploration with identification of the arterial injury.  Exploration of the index wound with debridement of the laceration repair of the superficialis and profundus tendons with identification of the digital nerve laceration bringing the microscope into the field and repair of the digital artery on the small finger and repair of the digital nerve on the index finger with using microsurgical techniques, closure of the wounds and application of the dressings and splint. Electronically signed by: Daryll Brod, MD Date: 10/21/2019 Time: 4:29 PM

## 2019-10-21 NOTE — Anesthesia Preprocedure Evaluation (Addendum)
Anesthesia Evaluation  Patient identified by MRN, date of birth, ID band Patient awake    Reviewed: Allergy & Precautions, H&P , NPO status , Patient's Chart, lab work & pertinent test results  Airway Mallampati: II   Neck ROM: full    Dental  (+) Poor Dentition, Chipped, Missing, Loose,  VERY LOOSE UPPER INCISOR NOTED ON INDUCTION:   Pulmonary Current Smoker and Patient abstained from smoking.,    breath sounds clear to auscultation       Cardiovascular hypertension,  Rhythm:regular Rate:Normal     Neuro/Psych  Headaches, TIACVA    GI/Hepatic   Endo/Other  diabetes, Type 2  Renal/GU      Musculoskeletal   Abdominal   Peds  Hematology  (+) Blood dyscrasia, anemia ,   Anesthesia Other Findings   Reproductive/Obstetrics                            Anesthesia Physical Anesthesia Plan  ASA: II  Anesthesia Plan: General   Post-op Pain Management:    Induction: Intravenous  PONV Risk Score and Plan: 2 and Ondansetron, Dexamethasone, Midazolam and Treatment may vary due to age or medical condition  Airway Management Planned: LMA  Additional Equipment:   Intra-op Plan:   Post-operative Plan:   Informed Consent: I have reviewed the patients History and Physical, chart, labs and discussed the procedure including the risks, benefits and alternatives for the proposed anesthesia with the patient or authorized representative who has indicated his/her understanding and acceptance.       Plan Discussed with: CRNA, Anesthesiologist and Surgeon  Anesthesia Plan Comments:         Anesthesia Quick Evaluation

## 2019-10-21 NOTE — Op Note (Signed)
NAME: Tammy Bauer MEDICAL RECORD NO: [id number] DATE OF BIRTH: [birth year] FACILITY: [facility] LOCATION: [facility] PHYSICIAN: Tami Ribas, MD   OPERATIVE REPORT   DATE OF PROCEDURE: 10/21/19    PREOPERATIVE DIAGNOSIS:   Left palm lacerations with possible tendon artery nerve injury   POSTOPERATIVE DIAGNOSIS:   Left index finger radial digital nerve laceration, FDP laceration, FDS laceration; left small finger ulnar digital artery laceration and palm   PROCEDURE:   1.  Left index finger repair of FDS laceration zone 2 2.  Left index finger repair of FDS laceration zone 2 3.  Left index finger repair of radial digital nerve under microscope 4.  Left palm repair of ulnar digital artery to small finger under microscope in separate incision   SURGEON:  Betha Loa, M.D.   ASSISTANT: Cindee Salt, MD   ANESTHESIA:  General   INTRAVENOUS FLUIDS:  Per anesthesia flow sheet.   ESTIMATED BLOOD LOSS:  Minimal.   COMPLICATIONS:  None.   SPECIMENS:  none   TOURNIQUET TIME:    Total Tourniquet Time Documented: Upper Arm (Left) - 94 minutes Total: Upper Arm (Left) - 94 minutes    DISPOSITION:  Stable to PACU.   INDICATIONS: Tammy Bauer states she sustained a laceration to her left palm approximately 11 days ago.  She was seen in the emergency department where the wound was cleaned and sutured.  She followed up in the office.  To decrease incision index finger.  Recommended exploration of the wounds with repair of tendon artery nerve is necessary. Risks, benefits and alternatives of surgery were discussed including the risks of blood loss, infection, damage to nerves, vessels, tendons, ligaments, bone for surgery, need for additional surgery, complications with wound healing, continued pain, stiffness.  She voiced understanding of these risks and elected to proceed.  OPERATIVE COURSE:  After being identified preoperatively by myself,  the patient and I agreed  on the procedure and site of the procedure.  The surgical site was marked.  Surgical consent had been signed. She was given IV antibiotics as preoperative antibiotic prophylaxis. She was transferred to the operating room and placed on the operating table in supine position with the Left upper extremity on an arm board.  General anesthesia was induced by the anesthesiologist.  Left upper extremity was prepped and draped in normal sterile orthopedic fashion.  A surgical pause was performed between the surgeons, anesthesia, and operating room staff and all were in agreement as to the patient, procedure, and site of procedure.  Tourniquet at the proximal aspect of the extremity was inflated to 250 mmHg after exsanguination of the arm with an Esmarch bandage.    Sutures were removed and the incisions opened.  In the ulnar-sided wound there was laceration to the digital artery going toward the ulnar side of the small finger.  The digital nerve to the ulnar side of the finger was intact.  The FDP and FDS tendons to the small finger were intact.  The radial digital nerve and artery were identified and were intact.  In the second wound at the index finger the wound was again explored.  It was extended distally.  There was laceration of the FDP tendon of approximately 90% and the ulnar side of the FDS tendon.  The radial side of the FDS tendon was intact.  There was laceration of the radial digital nerve of greater than 75%.  The radial digital artery was intact with the exception of a branch  which had been lacerated and clotted off.  The A3 pulley was released and the distal aspect of the A2 pulley vented for approximately 2 mm.  A 4-0 Mersilene suture was used in a modified Kessler technique to reapproximate the ulnar arm of the FDS tendon.  This provided good reapproximation.  This slid underneath the A2 pulley nicely.  A 4 oh looped Supramid suture was used in a modified Kessler technique to reapproximate the FDP tendon  ends.  A running epitendinous suture was then placed with 6-0 Prolene suture.  There was good excursion of this repair.  The microscope was brought in.  Micro dissection of the radial digital nerve and artery was performed.  There was laceration of a branch of the artery that had clotted off.  The main trunk of the artery was intact distally into the finger.  There was near complete laceration of the radial digital nerve.  The ends were freshened with the scissors and then reapproximated with 9-0 nylon suture in an interrupted fashion.  Good reapproximation was obtained.  There were 2 main bundles of the nerve which were repaired.  Attention was then returned to the ulnar-sided wound.  This had been extended both proximally and distally to aid in visualization at the start of the case.  The ulnar digital nerve to the small finger was again noted to be intact.  The ulnar digital artery was lacerated almost entirely in the palm.  The ends were cleaned of adventitia and the lumen cleared.  A 9-0 nylon suture was used in interrupted circumferential fashion to reapproximate the arterial ends.  Good reapproximation was obtained.  The wounds were copiously irrigated with sterile saline.  They were closed with 4-0 nylon in a horizontal mattress fashion.  There were injected with quarter percent plain Marcaine to aid in postoperative analgesia.  There were then dressed with sterile Xeroform 4 x 4's and wrapped with a Kerlix bandage.  A dorsal blocking splint was placed with the wrist flexed approximately 30 to 40 degrees the MPs flexed and the IP is extended.  This was wrapped with Kerlix and Ace bandage.  The tourniquet was deflated at 94 minutes.  Fingertips were pink with brisk capillary refill after deflation of tourniquet.  The operative  drapes were broken down.  The patient was awoken from anesthesia safely.  She was transferred back to the stretcher and taken to PACU in stable condition.  I will see her back in the  office in 1 week for postoperative followup.  I will give her a prescription for Norco 5/325 1-2 tabs PO q6 hours prn pain, dispense # 20.   Leanora Cover, MD Electronically signed, 10/21/19

## 2019-10-21 NOTE — Anesthesia Procedure Notes (Signed)
Procedure Name: LMA Insertion Date/Time: 10/21/2019 2:41 PM Performed by: Willa Frater, CRNA Pre-anesthesia Checklist: Patient identified, Emergency Drugs available, Suction available and Patient being monitored Patient Re-evaluated:Patient Re-evaluated prior to induction Oxygen Delivery Method: Circle system utilized Preoxygenation: Pre-oxygenation with 100% oxygen Induction Type: IV induction Ventilation: Mask ventilation without difficulty LMA: LMA inserted LMA Size: 4.0 Number of attempts: 1 Airway Equipment and Method: Bite block Placement Confirmation: positive ETCO2 Tube secured with: Tape Dental Injury: Teeth and Oropharynx as per pre-operative assessment

## 2019-10-22 ENCOUNTER — Encounter (HOSPITAL_BASED_OUTPATIENT_CLINIC_OR_DEPARTMENT_OTHER): Payer: Self-pay | Admitting: Orthopedic Surgery

## 2019-10-28 DIAGNOSIS — S64491A Injury of digital nerve of left index finger, initial encounter: Secondary | ICD-10-CM | POA: Insufficient documentation

## 2019-11-14 NOTE — Progress Notes (Signed)
[facility] Summary Report / Loop Recorder 

## 2019-11-20 ENCOUNTER — Ambulatory Visit (INDEPENDENT_AMBULATORY_CARE_PROVIDER_SITE_OTHER): Payer: Self-pay | Admitting: *Deleted

## 2019-11-20 DIAGNOSIS — I639 Cerebral infarction, unspecified: Secondary | ICD-10-CM

## 2019-11-20 LAB — CUP PACEART REMOTE DEVICE CHECK
Date Time Interrogation Session: 20201217121728
Implantable Pulse Generator Implant Date: 20180725

## 2019-12-11 MED FILL — LOSARTAN POTASSIUM 100 MG T: 100 | 30 days supply | Qty: 30 | Fill #1

## 2019-12-11 MED FILL — ?ATORVASTATIN 40MG TABLET: 40 | 30 days supply | Qty: 30 | Fill #1

## 2019-12-11 MED FILL — CLOPIDOGREL 75 MG TABLET: 75 | 30 days supply | Qty: 30 | Fill #1

## 2019-12-11 MED FILL — ?GLIPIZIDE 10 MG TABLET: 10 | 30 days supply | Qty: 60 | Fill #1

## 2019-12-11 MED FILL — ISOSORBIDE MN ER 30 MG TAB: 30 | 30 days supply | Qty: 30 | Fill #0

## 2019-12-11 MED FILL — hydrALAZINE HCL 10 MG TABS: 10 | 90 days supply | Qty: 90 | Fill #1

## 2019-12-11 MED FILL — ?AMLODIPINE BESYLATE 10 MG: 10 | 30 days supply | Qty: 30 | Fill #3

## 2019-12-20 NOTE — Progress Notes (Signed)
ILR remote 

## 2019-12-23 ENCOUNTER — Ambulatory Visit (INDEPENDENT_AMBULATORY_CARE_PROVIDER_SITE_OTHER): Payer: Self-pay | Admitting: *Deleted

## 2019-12-23 DIAGNOSIS — I639 Cerebral infarction, unspecified: Secondary | ICD-10-CM

## 2019-12-23 LAB — CUP PACEART REMOTE DEVICE CHECK
Date Time Interrogation Session: 20210119123432
Implantable Pulse Generator Implant Date: 20180725

## 2020-01-26 ENCOUNTER — Ambulatory Visit (INDEPENDENT_AMBULATORY_CARE_PROVIDER_SITE_OTHER): Payer: Self-pay | Admitting: *Deleted

## 2020-01-26 DIAGNOSIS — I639 Cerebral infarction, unspecified: Secondary | ICD-10-CM

## 2020-01-26 LAB — CUP PACEART REMOTE DEVICE CHECK
Date Time Interrogation Session: 20210222002538
Implantable Pulse Generator Implant Date: 20180725

## 2020-01-27 NOTE — Progress Notes (Signed)
ILR Remote 

## 2020-02-06 MED FILL — ISOSORBIDE MN ER 30 MG TAB: 30 | 30 days supply | Qty: 30 | Fill #1

## 2020-02-06 MED FILL — LOSARTAN POTASSIUM 100 MG T: 100 | 30 days supply | Qty: 30 | Fill #2

## 2020-02-06 MED FILL — !LANTUS 100 UNITS/ML VIAL: 100 | 25 days supply | Qty: 10 | Fill #1

## 2020-02-06 MED FILL — AMLODIPINE BESYLATE 10 MG T: 10 | 30 days supply | Qty: 30 | Fill #4

## 2020-02-06 MED FILL — CLOPIDOGREL 75 MG TABLET: 75 | 30 days supply | Qty: 30 | Fill #2

## 2020-02-06 MED FILL — ?ATORVASTATIN 40MG TABL: 40 | 30 days supply | Qty: 30 | Fill #2

## 2020-02-06 MED FILL — ?GLIPIZIDE 10 MG TABLET: 10 | 30 days supply | Qty: 60 | Fill #2

## 2020-02-29 LAB — CUP PACEART REMOTE DEVICE CHECK
Date Time Interrogation Session: 20210325012646
Implantable Pulse Generator Implant Date: 20180725

## 2020-03-01 ENCOUNTER — Ambulatory Visit (INDEPENDENT_AMBULATORY_CARE_PROVIDER_SITE_OTHER): Payer: Self-pay | Admitting: *Deleted

## 2020-03-01 DIAGNOSIS — I639 Cerebral infarction, unspecified: Secondary | ICD-10-CM

## 2020-03-01 NOTE — Progress Notes (Signed)
ILR Remote 

## 2020-03-23 ENCOUNTER — Other Ambulatory Visit (INDEPENDENT_AMBULATORY_CARE_PROVIDER_SITE_OTHER): Payer: Self-pay | Admitting: Primary Care

## 2020-03-23 MED FILL — ISOSORBIDE MN ER 30 MG TAB: 30 | 30 days supply | Qty: 30 | Fill #2

## 2020-03-23 MED FILL — ?ATORVASTATIN 40MG TABLET: 40 | 30 days supply | Qty: 30 | Fill #3

## 2020-03-23 MED FILL — !LANTUS 100 UNITS/ML VIAL: 100 | 25 days supply | Qty: 10 | Fill #2

## 2020-03-23 MED FILL — ?GLIPIZIDE 10 MG TABLET: 10 | 30 days supply | Qty: 60 | Fill #3

## 2020-03-23 MED FILL — AMLODIPINE BESYLATE 10 MG T: 10 | 30 days supply | Qty: 30 | Fill #0

## 2020-03-23 MED FILL — CLOPIDOGREL 75 MG TABLET: 75 | 30 days supply | Qty: 30 | Fill #3

## 2020-03-23 MED FILL — LOSARTAN POTASSIUM 100 MG T: 100 | 30 days supply | Qty: 30 | Fill #3

## 2020-03-31 ENCOUNTER — Telehealth: Payer: Self-pay

## 2020-03-31 NOTE — Telephone Encounter (Signed)
Left message for patient to inform of disconnected monitor. 

## 2020-04-29 ENCOUNTER — Ambulatory Visit (INDEPENDENT_AMBULATORY_CARE_PROVIDER_SITE_OTHER): Payer: Self-pay | Admitting: Primary Care

## 2020-04-29 ENCOUNTER — Other Ambulatory Visit (INDEPENDENT_AMBULATORY_CARE_PROVIDER_SITE_OTHER): Payer: Self-pay | Admitting: Primary Care

## 2020-04-29 ENCOUNTER — Other Ambulatory Visit: Payer: Self-pay

## 2020-04-29 ENCOUNTER — Encounter (INDEPENDENT_AMBULATORY_CARE_PROVIDER_SITE_OTHER): Payer: Self-pay | Admitting: Primary Care

## 2020-04-29 VITALS — BP 165/93 | HR 81 | Temp 97.5°F | Ht 62.0 in | Wt 150.8 lb

## 2020-04-29 DIAGNOSIS — E119 Type 2 diabetes mellitus without complications: Secondary | ICD-10-CM

## 2020-04-29 DIAGNOSIS — Z794 Long term (current) use of insulin: Secondary | ICD-10-CM

## 2020-04-29 DIAGNOSIS — I10 Essential (primary) hypertension: Secondary | ICD-10-CM

## 2020-04-29 DIAGNOSIS — E782 Mixed hyperlipidemia: Secondary | ICD-10-CM

## 2020-04-29 DIAGNOSIS — E1159 Type 2 diabetes mellitus with other circulatory complications: Secondary | ICD-10-CM

## 2020-04-29 LAB — POCT CBG (FASTING - GLUCOSE)-MANUAL ENTRY: Glucose Fasting, POC: 219 mg/dL — AB (ref 70–99)

## 2020-04-29 LAB — POCT GLYCOSYLATED HEMOGLOBIN (HGB A1C): Hemoglobin A1C: 10 % — AB (ref 4.0–5.6)

## 2020-04-29 MED ORDER — INSULIN GLARGINE 100 UNIT/ML ~~LOC~~ SOLN: 40.0000 [IU] | Freq: Every day | SUBCUTANEOUS | 3 refills | Status: DC

## 2020-04-29 MED ORDER — ATORVASTATIN CALCIUM 40 MG PO TABS: 40.0000 mg | ORAL_TABLET | Freq: Every day | ORAL | 1 refills | Status: DC

## 2020-04-29 MED ORDER — LOSARTAN POTASSIUM 100 MG PO TABS: 100.0000 mg | ORAL_TABLET | Freq: Every day | ORAL | 3 refills | Status: DC

## 2020-04-29 MED ORDER — HYDRALAZINE HCL 50 MG PO TABS: 50.0000 mg | ORAL_TABLET | Freq: Three times a day (TID) | ORAL | 0 refills | Status: DC

## 2020-04-29 MED ORDER — GLIPIZIDE 10 MG PO TABS: 10.0000 mg | ORAL_TABLET | Freq: Two times a day (BID) | ORAL | 3 refills | Status: DC

## 2020-04-29 MED ORDER — SITAGLIPTIN PHOSPHATE 100 MG PO TABS: 100.0000 mg | ORAL_TABLET | Freq: Every day | ORAL | 1 refills | Status: DC

## 2020-04-29 MED ORDER — AMLODIPINE BESYLATE 10 MG PO TABS: 10.0000 mg | ORAL_TABLET | Freq: Every day | ORAL | 3 refills | Status: DC

## 2020-04-29 NOTE — Patient Instructions (Addendum)
Medication changes diabetic medication continue glipizide 10 mg twice daily, Lantus 40 units at bedtime added Januvia 100 mg daily Hypertension medication changes added hydralazine 50 mg 3 times a day continue losartan 100 mg daily and amlodipine 10 mg daily. Continue all antihypertensives as prescribed.  Remember to bring in your blood pressure log with you for your follow up appointment.  DASH/Mediterranean Diets are healthier choices for HTN.

## 2020-04-30 MED FILL — !LANTUS 100 UNITS/ML VIAL: 100 | 25 days supply | Qty: 10 | Fill #0

## 2020-04-30 MED FILL — !JANUVIA 100MG TABLET: 100 | 30 days supply | Qty: 30 | Fill #0

## 2020-04-30 MED FILL — ?GLIPIZIDE 10 MG TABLET: 10 | 30 days supply | Qty: 60 | Fill #0

## 2020-04-30 MED FILL — ?ATORVASTATIN 40MG TABLET: 40 | 30 days supply | Qty: 30 | Fill #0

## 2020-04-30 MED FILL — hydrALAZINE HCL 50 MG TABS: 50 | 30 days supply | Qty: 90 | Fill #0

## 2020-04-30 MED FILL — ?AMLODIPINE BESYL 10MG TABL: 10 | 30 days supply | Qty: 30 | Fill #0

## 2020-04-30 MED FILL — LOSARTAN POTASSIUM 100 MG T: 100 | 30 days supply | Qty: 30 | Fill #0

## 2020-05-09 NOTE — Progress Notes (Signed)
Established Patient Office Visit  Subjective:  Patient ID: Tammy Bauer, female    DOB: 20-Sep-1966  Age: 54 y.o. MRN: [id number]  CC:  Chief Complaint  Patient presents with  . Diabetes    HPI [name] is a 54 year old female presents for non compliant type 2 diabetes management and hypertension. Presents today with a elevated Blood pressure 165/93. She denies shortness of breath, headaches, chest pain or lower extremity edema  Past Medical History:  Diagnosis Date  . Anemia   . Diabetes mellitus without complication (HCC)   . Hypertension   . Stroke [facility])    x2; no deficits  . Thyroid nodule 10/02/2017    Past Surgical History:  Procedure Laterality Date  . ENDOMETRIAL ABLATION N/A 01/30/2018   Procedure: ENDOMETRIAL ABLATION WITH MINERVA;  Surgeon: Lazaro Arms, MD;  Location: [facility];  Service: Gynecology;  Laterality: N/A;  . HYSTEROSCOPY WITH D & C N/A 01/30/2018   Procedure: DILATATION AND CURETTAGE /HYSTEROSCOPY;  Surgeon: Lazaro Arms, MD;  Location: [facility];  Service: Gynecology;  Laterality: N/A;  . LOOP RECORDER INSERTION N/A 06/27/2017   Procedure: Loop Recorder Insertion;  Surgeon: Regan Lemming, MD;  Location: [facility];  Service: Cardiovascular;  Laterality: N/A;  . NERVE, TENDON AND ARTERY REPAIR Left 10/21/2019   Procedure: WOUND EXPLORATION LEFT HAND WITH REPAIR TENDON, ARTERY AND NERVE ;  Surgeon: Betha Loa, MD;  Location: [facility];  Service: Orthopedics;  Laterality: Left;  . TEE WITHOUT CARDIOVERSION N/A 06/27/2017   Procedure: TRANSESOPHAGEAL ECHOCARDIOGRAM (TEE);  Surgeon: [name], MD;  Location: [facility] ENDOSCOPY;  Service: Cardiovascular;  Laterality: N/A;    Family History  Problem Relation Age of Onset  . Stroke Maternal Grandfather     Social History   Socioeconomic History  . Marital status: Married    Spouse name: Not on file  . Number of children: Not on file  . Years of education:  Not on file  . Highest education level: Not on file  Occupational History  . Not on file  Tobacco Use  . Smoking status: Current Every Day Smoker    Packs/day: 0.25    Years: 20.00    Pack years: 5.00    Types: Cigarettes    Start date: 08/29/1999  . Smokeless tobacco: Never Used  . Tobacco comment: smoke 5 cigarettes a day  Substance and Sexual Activity  . Alcohol use: No  . Drug use: No  . Sexual activity: Not Currently    Birth control/protection: None  Other Topics Concern  . Not on file  Social History Narrative  . Not on file   Social Determinants of Health   Financial Resource Strain:   . Difficulty of Paying Living Expenses:   Food Insecurity:   . Worried About Programme researcher, broadcasting/film/video in the Last Year:   . Barista in the Last Year:   Transportation Needs:   . Freight forwarder (Medical):   Marland Kitchen Lack of Transportation (Non-Medical):   Physical Activity:   . Days of Exercise per Week:   . Minutes of Exercise per Session:   Stress:   . Feeling of Stress :   Social Connections:   . Frequency of Communication with Friends and Family:   . Frequency of Social Gatherings with Friends and Family:   . Attends Religious Services:   . Active Member of Clubs or Organizations:   . Attends Club  or Organization Meetings:   Marland Kitchen Marital Status:   Intimate Partner Violence:   . Fear of Current or Ex-Partner:   . Emotionally Abused:   Marland Kitchen Physically Abused:   . Sexually Abused:     Outpatient Medications Prior to Visit  Medication Sig Dispense Refill  . acetaminophen (TYLENOL) 325 MG tablet Take 650 mg by mouth every 6 (six) hours as needed for mild pain or moderate pain.     . cephALEXin (KEFLEX) 500 MG capsule Take 1 capsule (500 mg total) by mouth 2 (two) times daily. 14 capsule 0  . clopidogrel (PLAVIX) 75 MG tablet Take 1 tablet (75 mg total) by mouth daily. 30 tablet 3  . diclofenac sodium (VOLTAREN) 1 % GEL Apply 2 g topically 4 (four) times daily. 100 g 1  .  HYDROcodone-acetaminophen (NORCO) 5-325 MG tablet 1-2 tabs po q6 hours prn pain 20 tablet 0  . INSULIN SYRINGE 1CC/29G (B-D INSULIN SYRINGE) 29G X 1/2" 1 ML MISC 1 application by Does not apply route at bedtime. 30 each 5  . amLODipine (NORVASC) 10 MG tablet Take 1 tablet (10 mg total) by mouth daily. TAKE ONE TABLET BY MOUTH ONCE DAILY 30 tablet 3  . atorvastatin (LIPITOR) 40 MG tablet Take 1 tablet (40 mg total) by mouth at bedtime. Any generic statin from [employer] list is okay 30 tablet 5  . carvedilol (COREG) 12.5 MG tablet Take 1 tablet (12.5 mg total) by mouth 2 (two) times daily with a meal. 60 tablet 3  . glipiZIDE (GLUCOTROL) 10 MG tablet Take 1 tablet (10 mg total) by mouth 2 (two) times daily before a meal. 60 tablet 3  . insulin glargine (LANTUS) 100 UNIT/ML injection Inject 0.4 mLs (40 Units total) into the skin daily. 10 mL 3  . isosorbide mononitrate (IMDUR) 30 MG 24 hr tablet Take 1 tablet (30 mg total) by mouth daily. 30 tablet 3  . losartan (COZAAR) 100 MG tablet Take 1 tablet (100 mg total) by mouth daily. 30 tablet 3  . Omega-3 Fatty Acids (FISH OIL) 500 MG CAPS Take 1 capsule (500 mg total) by mouth every morning. 30 capsule 3  . hydrALAZINE (APRESOLINE) 50 MG tablet Take 1 tablet (50 mg total) by mouth 3 (three) times daily. 90 tablet 0   No facility-administered medications prior to visit.    Allergies  Allergen Reactions  . Ace Inhibitors Cough    ROS Review of Systems  Endocrine: Positive for polydipsia, polyphagia and polyuria.  Neurological: Positive for numbness.       Tingling   All other systems reviewed and are negative.     Objective:    Physical Exam General: No apparent distress. Eyes: Extraocular eye movements intact, pupils equal and round. Neck: Supple, trachea midline. Thyroid: No enlargement, mobile without fixation, no tenderness. Cardiovascular: Regular rhythm and rate, no murmur, normal radial pulses. Respiratory: Normal respiratory  effort, clear to auscultation. Gastrointestinal: Normal pitch active bowel sounds, nontender abdomen without distention or appreciable hepatomegaly. Neurologic: tested, deep tendon reflexes positive, no tremor, Musculoskeletal: Normal muscle tone, no tenderness on palpation of tibia, no excessive thoracic kyphosis. Skin: Appropriate warmth, no visible rash. Mental status: Alert, conversant, speech clear, thought logical, appropriate mood and affect, no hallucinations or delusions evident. Hematologic/lymphatic: No cervical adenopathy, no visible ecchymoses. BP (!) 165/93 (BP Location: Left Arm, Patient Position: Sitting, Cuff Size: Normal)   Pulse 81   Temp (!) 97.5 F (36.4 C) (Tympanic)   Ht 5\' 2"  (1.575 m)  Wt 150 lb 12.8 oz (68.4 kg)   SpO2 94%   BMI 27.58 kg/m  Wt Readings from Last 3 Encounters:  04/29/20 150 lb 12.8 oz (68.4 kg)  10/21/19 145 lb 11.6 oz (66.1 kg)  10/02/19 147 lb 3.2 oz (66.8 kg)     Health Maintenance Due  Topic Date Due  . OPHTHALMOLOGY EXAM  Never done  . COVID-19 Vaccine (1) Never done  . MAMMOGRAM  Never done  . Fecal DNA (Cologuard)  Never done  . FOOT EXAM  02/20/2020    There are no preventive care reminders to display for this patient.  Lab Results  Component Value Date   TSH 2.530 05/21/2019   Lab Results  Component Value Date   WBC 9.4 10/11/2019   HGB 10.3 (L) 10/11/2019   HCT 31.4 (L) 10/11/2019   MCV 100.3 (H) 10/11/2019   PLT 293 10/11/2019   Lab Results  Component Value Date   NA 138 10/02/2019   K 4.0 10/02/2019   CO2 21 10/02/2019   GLUCOSE 192 (H) 10/02/2019   BUN 13 10/02/2019   CREATININE 0.67 10/02/2019   BILITOT 0.5 10/02/2019   ALKPHOS 56 10/02/2019   AST 10 10/02/2019   ALT 12 10/02/2019   PROT 7.2 10/02/2019   ALBUMIN 4.4 10/02/2019   CALCIUM 10.0 10/02/2019   ANIONGAP 7 05/22/2019   Lab Results  Component Value Date   CHOL 192 10/02/2019   Lab Results  Component Value Date   HDL 38 (L) 10/02/2019    Lab Results  Component Value Date   LDLCALC 135 (H) 10/02/2019   Lab Results  Component Value Date   TRIG 106 10/02/2019   Lab Results  Component Value Date   CHOLHDL 5.1 (H) 10/02/2019   Lab Results  Component Value Date   HGBA1C 10.0 (A) 04/29/2020      Assessment & Plan:  Tammy Bauer was seen today for diabetes.  Diagnoses and all orders for this visit:  Type 2 diabetes mellitus with other circulatory complication, with long-term current use of insulin (HCC) A Goal of therapy: Less than 6.5 hemoglobin A1c.Decrease foods that are high in carbohydrates are the following rice, potatoes, breads, sugars, and pastas.  Reduction in the intake (eating) will assist in lowering your blood sugars. -     HgB A1c 10 previous 7 months ago 8.1  -     Glucose (CBG), Fasting Medication refilled and to be continued Lantus 40 units at bedtime, Januvia 100 mg daily and Glucotrol 10 mg twice daily.  She is unable to tolerate Metformin causes GI upset.  Discussed in detail complications with elevated A1c examples given diabetic retinopathy, amputations, kidney failure leading to hemodialysis, complications with wound healing  Mixed hyperlipidemia Encouraged to try to decrease your fatty foods, red meat, cheese, milk and increase fiber like whole grains and veggies. atorvastatin (LIPITOR) 40 MG tablet; Take 1 tablet (40 mg total) by mouth at bedtime.  Comprehensive diabetic foot examination, type 2 DM, encounter for [facility]) Completed  Essential hypertension Unacceptable noncompliance uncontrolled. Counseled on blood pressure goal of less than 130/80, low-sodium, DASH diet, medication compliance, 150 minutes of moderate intensity exercise per week. Discussed medication compliance, adverse effects. Added hydralazine 50 mg 3 times a day, continue losartan 100 mg daily and amlodipine 10 mg daily.  Patient will return in 4 to 6 weeks for blood pressure reevaluation and medication compliance  Other  orders -     hydrALAZINE (APRESOLINE) 50 MG tablet; Take 1  tablet (50 mg total) by mouth 3 (three) times daily. -     losartan (COZAAR) 100 MG tablet; Take 1 tablet (100 mg total) by mouth daily. -     amLODipine (NORVASC) 10 MG tablet; Take 1 tablet (10 mg total) by mouth daily. TAKE ONE TABLET BY MOUTH ONCE DAILY -     insulin glargine (LANTUS) 100 UNIT/ML injection; Inject 0.4 mLs (40 Units total) into the skin daily. -     glipiZIDE (GLUCOTROL) 10 MG tablet; Take 1 tablet (10 mg total) by mouth 2 (two) times daily before a meal. -     sitaGLIPtin (JANUVIA) 100 MG tablet; Take 1 tablet (100 mg total) by mouth daily.   Meds ordered this encounter  Medications  . hydrALAZINE (APRESOLINE) 50 MG tablet    Sig: Take 1 tablet (50 mg total) by mouth 3 (three) times daily.    Dispense:  90 tablet    Refill:  0  . losartan (COZAAR) 100 MG tablet    Sig: Take 1 tablet (100 mg total) by mouth daily.    Dispense:  90 tablet    Refill:  3  . amLODipine (NORVASC) 10 MG tablet    Sig: Take 1 tablet (10 mg total) by mouth daily. TAKE ONE TABLET BY MOUTH ONCE DAILY    Dispense:  90 tablet    Refill:  3  . insulin glargine (LANTUS) 100 UNIT/ML injection    Sig: Inject 0.4 mLs (40 Units total) into the skin daily.    Dispense:  10 mL    Refill:  3    E11.10  . atorvastatin (LIPITOR) 40 MG tablet    Sig: Take 1 tablet (40 mg total) by mouth at bedtime. Any generic statin from [facility] list is okay    Dispense:  90 tablet    Refill:  1  . glipiZIDE (GLUCOTROL) 10 MG tablet    Sig: Take 1 tablet (10 mg total) by mouth 2 (two) times daily before a meal.    Dispense:  180 tablet    Refill:  3  . sitaGLIPtin (JANUVIA) 100 MG tablet    Sig: Take 1 tablet (100 mg total) by mouth daily.    Dispense:  90 tablet    Refill:  1    Follow-up: Return in about 5 weeks (around 06/03/2020) for [facility] tele visit .    Grayce Sessions, NP

## 2020-05-10 ENCOUNTER — Ambulatory Visit (INDEPENDENT_AMBULATORY_CARE_PROVIDER_SITE_OTHER): Payer: Self-pay | Admitting: *Deleted

## 2020-05-10 DIAGNOSIS — I639 Cerebral infarction, unspecified: Secondary | ICD-10-CM

## 2020-05-10 LAB — CUP PACEART REMOTE DEVICE CHECK
Date Time Interrogation Session: 20210606231449
Implantable Pulse Generator Implant Date: 20180725

## 2020-05-12 NOTE — Progress Notes (Signed)
[facility] Summary Report / Loop Recorder 

## 2020-05-21 ENCOUNTER — Telehealth (INDEPENDENT_AMBULATORY_CARE_PROVIDER_SITE_OTHER): Payer: Self-pay

## 2020-05-21 NOTE — Telephone Encounter (Signed)
Patient called to make a medication refill for   clopidogrel (PLAVIX) 75 MG tablet   [facility]   Please advice [phone]

## 2020-05-22 NOTE — Telephone Encounter (Signed)
Ask patient to follow up with requesting plavix

## 2020-05-24 NOTE — Telephone Encounter (Signed)
Please clarify

## 2020-05-28 ENCOUNTER — Telehealth (INDEPENDENT_AMBULATORY_CARE_PROVIDER_SITE_OTHER): Payer: Self-pay

## 2020-05-28 NOTE — Telephone Encounter (Signed)
Patient called to make a medication refill for   clopidogrel (PLAVIX) 75 MG tablet   [facility]  Please advice [phone]

## 2020-05-30 ENCOUNTER — Encounter (INDEPENDENT_AMBULATORY_CARE_PROVIDER_SITE_OTHER): Payer: Self-pay | Admitting: Primary Care

## 2020-06-01 ENCOUNTER — Other Ambulatory Visit: Payer: Self-pay

## 2020-06-01 ENCOUNTER — Other Ambulatory Visit (INDEPENDENT_AMBULATORY_CARE_PROVIDER_SITE_OTHER): Payer: Self-pay | Admitting: Primary Care

## 2020-06-01 ENCOUNTER — Encounter (INDEPENDENT_AMBULATORY_CARE_PROVIDER_SITE_OTHER): Payer: Self-pay | Admitting: Primary Care

## 2020-06-01 ENCOUNTER — Telehealth (INDEPENDENT_AMBULATORY_CARE_PROVIDER_SITE_OTHER): Payer: Self-pay | Admitting: Primary Care

## 2020-06-01 DIAGNOSIS — Z794 Long term (current) use of insulin: Secondary | ICD-10-CM

## 2020-06-01 DIAGNOSIS — I10 Essential (primary) hypertension: Secondary | ICD-10-CM

## 2020-06-01 DIAGNOSIS — E119 Type 2 diabetes mellitus without complications: Secondary | ICD-10-CM

## 2020-06-01 DIAGNOSIS — Z760 Encounter for issue of repeat prescription: Secondary | ICD-10-CM

## 2020-06-01 DIAGNOSIS — E1159 Type 2 diabetes mellitus with other circulatory complications: Secondary | ICD-10-CM

## 2020-06-01 MED ORDER — "INSULIN SYRINGE 29G X 1/2"" 1 ML MISC": 1.0000 "application " | Freq: Every day | 5 refills | Status: AC

## 2020-06-01 MED ORDER — CLOPIDOGREL BISULFATE 75 MG PO TABS: 75.0000 mg | ORAL_TABLET | Freq: Every day | ORAL | 1 refills | Status: DC

## 2020-06-01 MED FILL — CLOPIDOGREL 75 MG TABLET: 75 | 30 days supply | Qty: 30 | Fill #0

## 2020-06-01 MED FILL — TRUEPLUS SYR 1ML 30GX5/16: 30G X 5/16" | 25 days supply | Qty: 100 | Fill #0

## 2020-06-01 NOTE — Progress Notes (Signed)
Discuss medication Plavix

## 2020-06-01 NOTE — Progress Notes (Addendum)
Virtual Visit via Telephone Note  I connected with Tammy Bauer on 06/01/20 at  9:30 AM EDT by telephone and verified that I am speaking with the correct person using two identifiers.   I discussed the limitations, risks, security and privacy concerns of performing an evaluation and management service by telephone and the availability of in person appointments. I also discussed with the patient that there may be a patient responsible charge related to this service. The patient expressed understanding and agreed to proceed. Patient location unknown Tammy Passe, NP was at [facility]    History of Present Illness: [name] is a 54 year old female that is having a tele visit concerns with plavix. She was started on this medication 2018 by neurologist Dr. Pearlean Bauer for cryptogenic stroke. Consulted with him Tammy Riley, MD sent to Tammy Sessions, NP She had a cryptogenic stroke in 2018 and was started on Plavix. This should be lifelong if patient cannot afford it she can be switched to aspirin 81 mg if she has been free from recurrent TIA or stroke.   Past Medical History:  Diagnosis Date  . Anemia   . Diabetes mellitus without complication (HCC)   . Hypertension   . Stroke [facility])    x2; no deficits  . Thyroid nodule 10/02/2017   Denies shortness of breath, headaches, chest pain or lower extremity edema  Observations/Objective: Review of Systems  Constitutional: Positive for malaise/fatigue.       After she takes her first dose of hydralazine   All other systems reviewed and are negative.  Assessment and Plan: Diagnoses and all orders for this visit:  Type 2 diabetes mellitus with other circulatory complication, with long-term current use of insulin (HCC)  Discussed  Diabetes increases your chances of stroke and heart attack over 300 % and is the leading cause of blindness and kidney failure in the [place]. Please make sure you decrease bad  carbs like white bread, white rice, potatoes, corn, soft drinks, pasta, cereals, refined sugars, sweet tea, dried fruits, and fruit juice. Good carbs are okay to eat in moderation like sweet potatoes, brown rice, whole grain pasta/bread, most fruit (except dried fruit) and you can eat as many veggies as you want. She has been trying lifestyle modification and exercise.  Greater than 6.5 is considered diabetic. Between 6.4 and 5.7 is prediabetic If your A1C is less than 5.7 you are NOT diabetic.  Targets for Glucose Readings: Time of Check Target for patients WITHOUT Diabetes Target for DIABETICS  Before Meals Less than 100  less than 150  Two hours after meals Less than 200  Less than 250   Essential hypertension Goal for Bp 130/80. Change hydralazine to bid take 1 1/2 after lunch and dinner to see if it makes a difference in how she feels. Continue to a low-sodium, DASH diet, medication compliance, 150 minutes of moderate intensity exercise per week. Discussed medication compliance, adverse effects.  Medication refill -     clopidogrel (PLAVIX) 75 MG tablet; Take 1 tablet (75 mg total) by mouth daily.    Follow Up Instructions:    I discussed the assessment and treatment plan with the patient. The patient was provided an opportunity to ask questions and all were answered. The patient agreed with the plan and demonstrated an understanding of the instructions.   The patient was advised to call back or seek an in-person evaluation if the symptoms worsen or if the condition  fails to improve as anticipated.  I provided 12 minutes of non-face-to-face time during this encounter.   Tammy Sessions, NP

## 2020-06-14 ENCOUNTER — Ambulatory Visit (INDEPENDENT_AMBULATORY_CARE_PROVIDER_SITE_OTHER): Payer: Self-pay | Admitting: *Deleted

## 2020-06-14 DIAGNOSIS — I639 Cerebral infarction, unspecified: Secondary | ICD-10-CM

## 2020-06-14 LAB — CUP PACEART REMOTE DEVICE CHECK
Date Time Interrogation Session: 20210711235647
Implantable Pulse Generator Implant Date: 20180725

## 2020-06-15 NOTE — Progress Notes (Signed)
[facility] Summary Report / Loop Recorder 

## 2020-06-20 ENCOUNTER — Encounter (HOSPITAL_COMMUNITY): Payer: Self-pay | Admitting: *Deleted

## 2020-06-20 ENCOUNTER — Inpatient Hospital Stay (HOSPITAL_COMMUNITY)
Admission: EM | Admit: 2020-06-20 | Discharge: 2020-06-22 | DRG: 177 | Disposition: A | Discharge status: Home / Self Care | Payer: HRSA Program | Attending: Family Medicine | Admitting: Family Medicine

## 2020-06-20 ENCOUNTER — Other Ambulatory Visit: Payer: Self-pay

## 2020-06-20 ENCOUNTER — Emergency Department (HOSPITAL_COMMUNITY): Payer: HRSA Program

## 2020-06-20 DIAGNOSIS — E1159 Type 2 diabetes mellitus with other circulatory complications: Secondary | ICD-10-CM | POA: Diagnosis not present

## 2020-06-20 DIAGNOSIS — U071 COVID-19: Secondary | ICD-10-CM | POA: Diagnosis present

## 2020-06-20 DIAGNOSIS — Z794 Long term (current) use of insulin: Secondary | ICD-10-CM | POA: Diagnosis not present

## 2020-06-20 DIAGNOSIS — J1282 Pneumonia due to coronavirus disease 2019: Secondary | ICD-10-CM | POA: Diagnosis present

## 2020-06-20 DIAGNOSIS — R05 Cough: Secondary | ICD-10-CM | POA: Diagnosis present

## 2020-06-20 DIAGNOSIS — E871 Hypo-osmolality and hyponatremia: Secondary | ICD-10-CM | POA: Diagnosis present

## 2020-06-20 DIAGNOSIS — E1165 Type 2 diabetes mellitus with hyperglycemia: Secondary | ICD-10-CM | POA: Diagnosis present

## 2020-06-20 DIAGNOSIS — Z79899 Other long term (current) drug therapy: Secondary | ICD-10-CM | POA: Diagnosis not present

## 2020-06-20 DIAGNOSIS — Z7902 Long term (current) use of antithrombotics/antiplatelets: Secondary | ICD-10-CM

## 2020-06-20 DIAGNOSIS — E8809 Other disorders of plasma-protein metabolism, not elsewhere classified: Secondary | ICD-10-CM | POA: Diagnosis present

## 2020-06-20 DIAGNOSIS — Z8673 Personal history of transient ischemic attack (TIA), and cerebral infarction without residual deficits: Secondary | ICD-10-CM | POA: Diagnosis not present

## 2020-06-20 DIAGNOSIS — E860 Dehydration: Secondary | ICD-10-CM

## 2020-06-20 DIAGNOSIS — I152 Hypertension secondary to endocrine disorders: Secondary | ICD-10-CM | POA: Diagnosis present

## 2020-06-20 DIAGNOSIS — I10 Essential (primary) hypertension: Secondary | ICD-10-CM | POA: Diagnosis present

## 2020-06-20 DIAGNOSIS — J9601 Acute respiratory failure with hypoxia: Secondary | ICD-10-CM

## 2020-06-20 DIAGNOSIS — E785 Hyperlipidemia, unspecified: Secondary | ICD-10-CM | POA: Diagnosis present

## 2020-06-20 DIAGNOSIS — F17210 Nicotine dependence, cigarettes, uncomplicated: Secondary | ICD-10-CM | POA: Diagnosis present

## 2020-06-20 DIAGNOSIS — R809 Proteinuria, unspecified: Secondary | ICD-10-CM | POA: Diagnosis present

## 2020-06-20 DIAGNOSIS — E119 Type 2 diabetes mellitus without complications: Secondary | ICD-10-CM

## 2020-06-20 DIAGNOSIS — R739 Hyperglycemia, unspecified: Secondary | ICD-10-CM | POA: Diagnosis present

## 2020-06-20 DIAGNOSIS — Z823 Family history of stroke: Secondary | ICD-10-CM

## 2020-06-20 DIAGNOSIS — T380X5A Adverse effect of glucocorticoids and synthetic analogues, initial encounter: Secondary | ICD-10-CM | POA: Diagnosis present

## 2020-06-20 DIAGNOSIS — E1169 Type 2 diabetes mellitus with other specified complication: Secondary | ICD-10-CM | POA: Diagnosis present

## 2020-06-20 LAB — URINALYSIS, ROUTINE W REFLEX MICROSCOPIC
Bilirubin Urine: NEGATIVE
Glucose, UA: >=500 mg/dL — AB
Ketones, ur: 20 mg/dL — AB
Leukocytes,Ua: NEGATIVE
Nitrite: NEGATIVE
Protein, ur: >=300 mg/dL — AB
Specific Gravity, Urine: 1.035 — ABNORMAL HIGH (ref 1.005–1.030)
pH: 5 (ref 5.0–8.0)

## 2020-06-20 LAB — COMPREHENSIVE METABOLIC PANEL
ALT: 22 U/L (ref 0–44)
AST: 26 U/L (ref 15–41)
Albumin: 3.3 g/dL — ABNORMAL LOW (ref 3.5–5.0)
Alkaline Phosphatase: 56 U/L (ref 38–126)
Anion gap: 11 (ref 5–15)
BUN: 17 mg/dL (ref 6–20)
CO2: 23 mmol/L (ref 22–32)
Calcium: 9.1 mg/dL (ref 8.9–10.3)
Chloride: 99 mmol/L (ref 98–111)
Creatinine, Ser: 0.92 mg/dL (ref 0.44–1.00)
GFR calc Af Amer: >60 mL/min (ref >60)
GFR calc non Af Amer: >60 mL/min (ref >60)
Glucose, Bld: 303 mg/dL — ABNORMAL HIGH (ref 70–99)
Potassium: 3.8 mmol/L (ref 3.5–5.1)
Sodium: 133 mmol/L — ABNORMAL LOW (ref 135–145)
Total Bilirubin: 0.5 mg/dL (ref 0.3–1.2)
Total Protein: 7.4 g/dL (ref 6.5–8.1)

## 2020-06-20 LAB — CBC WITH DIFFERENTIAL/PLATELET
Abs Immature Granulocytes: 0.01 10*3/uL (ref 0.00–0.07)
Basophils Absolute: 0 10*3/uL (ref 0.0–0.1)
Basophils Relative: 1 %
Eosinophils Absolute: 0 10*3/uL (ref 0.0–0.5)
Eosinophils Relative: 0 %
HCT: 49.5 % — ABNORMAL HIGH (ref 36.0–46.0)
Hemoglobin: 16.5 g/dL — ABNORMAL HIGH (ref 12.0–15.0)
Immature Granulocytes: 0 %
Lymphocytes Relative: 23 %
Lymphs Abs: 1 10*3/uL (ref 0.7–4.0)
MCH: 32 pg (ref 26.0–34.0)
MCHC: 33.3 g/dL (ref 30.0–36.0)
MCV: 95.9 fL (ref 80.0–100.0)
Monocytes Absolute: 0.2 10*3/uL (ref 0.1–1.0)
Monocytes Relative: 6 %
Neutro Abs: 3 10*3/uL (ref 1.7–7.7)
Neutrophils Relative %: 70 %
Platelets: 176 10*3/uL (ref 150–400)
RBC: 5.16 MIL/uL — ABNORMAL HIGH (ref 3.87–5.11)
RDW: 12.9 % (ref 11.5–15.5)
WBC: 4.2 10*3/uL (ref 4.0–10.5)
nRBC: 0 % (ref 0.0–0.2)

## 2020-06-20 LAB — LACTATE DEHYDROGENASE: LDH: 289 U/L — ABNORMAL HIGH (ref 98–192)

## 2020-06-20 LAB — PROCALCITONIN: Procalcitonin: <0.1 ng/mL

## 2020-06-20 LAB — ABO/RH: ABO/RH(D): B POS

## 2020-06-20 LAB — SARS CORONAVIRUS 2 BY RT PCR (HOSPITAL ORDER, PERFORMED IN ~~LOC~~ HOSPITAL LAB): SARS Coronavirus 2: POSITIVE — AB

## 2020-06-20 LAB — TRIGLYCERIDES: Triglycerides: 217 mg/dL — ABNORMAL HIGH (ref <150)

## 2020-06-20 LAB — FIBRINOGEN: Fibrinogen: 686 mg/dL — ABNORMAL HIGH (ref 210–475)

## 2020-06-20 LAB — D-DIMER, QUANTITATIVE: D-Dimer, Quant: 1.23 ug/mL-FEU — ABNORMAL HIGH (ref 0.00–0.50)

## 2020-06-20 LAB — FERRITIN: Ferritin: 285 ng/mL (ref 11–307)

## 2020-06-20 LAB — LACTIC ACID, PLASMA: Lactic Acid, Venous: 1 mmol/L (ref 0.5–1.9)

## 2020-06-20 LAB — C-REACTIVE PROTEIN: CRP: 11.2 mg/dL — ABNORMAL HIGH (ref <1.0)

## 2020-06-20 MED ORDER — HYDROCOD POLST-CPM POLST ER 10-8 MG/5ML PO SUER: 5.0000 mL | Freq: Two times a day (BID) | ORAL | Status: DC | PRN

## 2020-06-20 MED ORDER — DEXAMETHASONE SODIUM PHOSPHATE 10 MG/ML IJ SOLN
6.0000 mg | Freq: Once | INTRAMUSCULAR | Status: AC
  Administered 2020-06-20: 6 mg via INTRAVENOUS
  Filled 2020-06-20: qty 1

## 2020-06-20 MED ORDER — ONDANSETRON HCL 4 MG PO TABS: 4.0000 mg | ORAL_TABLET | Freq: Four times a day (QID) | ORAL | Status: DC | PRN

## 2020-06-20 MED ORDER — ASCORBIC ACID 500 MG PO TABS
500.0000 mg | ORAL_TABLET | Freq: Every day | ORAL | Status: DC
  Administered 2020-06-21 – 2020-06-22 (×2): 500 mg via ORAL
  Filled 2020-06-20 (×2): qty 1

## 2020-06-20 MED ORDER — ZINC SULFATE 220 (50 ZN) MG PO CAPS
220.0000 mg | ORAL_CAPSULE | Freq: Every day | ORAL | Status: DC
  Administered 2020-06-21 – 2020-06-22 (×2): 220 mg via ORAL
  Filled 2020-06-20 (×2): qty 1

## 2020-06-20 MED ORDER — ACETAMINOPHEN 325 MG PO TABS
650.0000 mg | ORAL_TABLET | Freq: Once | ORAL | Status: AC | PRN
  Administered 2020-06-20: 650 mg via ORAL
  Filled 2020-06-20: qty 2

## 2020-06-20 MED ORDER — INSULIN ASPART 100 UNIT/ML ~~LOC~~ SOLN
0.0000 [IU] | Freq: Three times a day (TID) | SUBCUTANEOUS | Status: DC
  Administered 2020-06-21 (×3): 11 [IU] via SUBCUTANEOUS
  Administered 2020-06-22: 8 [IU] via SUBCUTANEOUS
  Administered 2020-06-22: 5 [IU] via SUBCUTANEOUS
  Filled 2020-06-20 (×2): qty 1

## 2020-06-20 MED ORDER — GUAIFENESIN-DM 100-10 MG/5ML PO SYRP: 10.0000 mL | ORAL_SOLUTION | ORAL | Status: DC | PRN

## 2020-06-20 MED ORDER — INSULIN ASPART 100 UNIT/ML ~~LOC~~ SOLN
0.0000 [IU] | Freq: Every day | SUBCUTANEOUS | Status: DC
  Administered 2020-06-21: 2 [IU] via SUBCUTANEOUS

## 2020-06-20 MED ORDER — SODIUM CHLORIDE 0.9 % IV SOLN: Freq: Once | INTRAVENOUS | Status: AC

## 2020-06-20 MED ORDER — DEXAMETHASONE SODIUM PHOSPHATE 10 MG/ML IJ SOLN: 6.0000 mg | INTRAMUSCULAR | Status: DC

## 2020-06-20 MED ORDER — ENOXAPARIN SODIUM 40 MG/0.4ML ~~LOC~~ SOLN
40.0000 mg | SUBCUTANEOUS | Status: DC
  Administered 2020-06-20: 40 mg via SUBCUTANEOUS
  Filled 2020-06-20 (×2): qty 0.4

## 2020-06-20 MED ORDER — AEROCHAMBER PLUS FLO-VU MEDIUM MISC
1.0000 | Freq: Once | Status: AC
  Administered 2020-06-20: 1
  Filled 2020-06-20: qty 1

## 2020-06-20 MED ORDER — ACETAMINOPHEN 325 MG PO TABS: 650.0000 mg | ORAL_TABLET | Freq: Four times a day (QID) | ORAL | Status: DC | PRN

## 2020-06-20 MED ORDER — DEXAMETHASONE SODIUM PHOSPHATE 10 MG/ML IJ SOLN
6.0000 mg | INTRAMUSCULAR | Status: DC
  Administered 2020-06-21 – 2020-06-22 (×2): 6 mg via INTRAVENOUS
  Filled 2020-06-20 (×2): qty 1

## 2020-06-20 MED ORDER — PANTOPRAZOLE SODIUM 40 MG PO TBEC
40.0000 mg | DELAYED_RELEASE_TABLET | Freq: Every day | ORAL | Status: DC
  Administered 2020-06-21 – 2020-06-22 (×2): 40 mg via ORAL
  Filled 2020-06-20 (×2): qty 1

## 2020-06-20 MED ORDER — ACETAMINOPHEN 325 MG PO TABS
325.0000 mg | ORAL_TABLET | Freq: Once | ORAL | Status: DC
  Filled 2020-06-20 (×2): qty 1

## 2020-06-20 MED ORDER — ALBUTEROL SULFATE HFA 108 (90 BASE) MCG/ACT IN AERS
2.0000 | INHALATION_SPRAY | Freq: Once | RESPIRATORY_TRACT | Status: AC
  Administered 2020-06-20: 2 via RESPIRATORY_TRACT
  Filled 2020-06-20: qty 6.7

## 2020-06-20 MED ORDER — ONDANSETRON HCL 4 MG/2ML IJ SOLN: 4.0000 mg | Freq: Four times a day (QID) | INTRAMUSCULAR | Status: DC | PRN

## 2020-06-20 MED ORDER — IBUPROFEN 400 MG PO TABS
600.0000 mg | ORAL_TABLET | Freq: Once | ORAL | Status: AC
  Administered 2020-06-20: 600 mg via ORAL
  Filled 2020-06-20: qty 2

## 2020-06-20 MED ORDER — DM-GUAIFENESIN ER 30-600 MG PO TB12
1.0000 | ORAL_TABLET | Freq: Two times a day (BID) | ORAL | Status: DC
  Administered 2020-06-20 – 2020-06-22 (×4): 1 via ORAL
  Filled 2020-06-20 (×4): qty 1

## 2020-06-20 MED ORDER — ALBUTEROL SULFATE HFA 108 (90 BASE) MCG/ACT IN AERS
2.0000 | INHALATION_SPRAY | Freq: Four times a day (QID) | RESPIRATORY_TRACT | Status: DC
  Administered 2020-06-20 – 2020-06-22 (×6): 2 via RESPIRATORY_TRACT
  Filled 2020-06-20: qty 6.7

## 2020-06-20 MED ORDER — SODIUM CHLORIDE 0.9 % IV SOLN
100.0000 mg | Freq: Every day | INTRAVENOUS | Status: DC
  Administered 2020-06-21 – 2020-06-22 (×2): 100 mg via INTRAVENOUS
  Filled 2020-06-20 (×3): qty 20

## 2020-06-20 MED ORDER — SODIUM CHLORIDE 0.9 % IV SOLN
100.0000 mg | INTRAVENOUS | Status: AC
  Filled 2020-06-20 (×2): qty 20

## 2020-06-20 MED ORDER — SODIUM CHLORIDE 0.9 % IV BOLUS
500.0000 mL | Freq: Once | INTRAVENOUS | Status: AC
  Administered 2020-06-20: 500 mL via INTRAVENOUS

## 2020-06-20 NOTE — H&P (Signed)
History and Physical  Tammy PayorMela Uzzell Opheim JXB:[id number] DOB: 1966-06-22 DOA: 06/20/2020  Referring physician: Cherly AndersonPetrucelli, Samantha R, PA-C PCP: Grayce SessionsEdwards, Michelle P, NP  Patient coming from: Home  Chief Complaint: Cough  HPI: Tammy Bauer is a 54 y.o. female with medical history significant for hypertension, hyperlipidemia, T2DM, CVA (10/02/2017), L MCA bifurcation aneurysm, thyroid nodule (right), hx of vaginal bleeding andanemia who presents to the emergency department due to 9-day onset of cough with production of thick cloudy white phlegm, subjective fever and chills which was controlled with Tylenol and NyQuil, she also complained of body aches in the lower back as well as decreased appetite.  Patient noted that her urine was darker and she thought it was probably due to dehydration.  Patient states that she has been exposed to family members who tested positive for COVID-19, she has not received any Covid vaccine and she denies chest pain, headache, nausea, vomiting, diarrhea, abdominal pain, burning sensation on urination, urinary frequency or urgency.  ED Course:  In the emergency department, she was noted to be febrile with a temperature of 101.79F, she was tachycardic and tachypneic.  Work-up in the ED showed H&H 16.5/49.5, mild hyponatremia, hyperglycemia, hypoalbuminemia, SARS coronavirus 2 was positive.  Urinalysis was positive for glycosuria and proteinuria.  Chest x-ray showed bibasilar pneumonia, left greater than right.  Tylenol and albuterol were given.  Patient was provided with supplemental oxygen due to O2 sats dropping to high 80s at bedside.  Hospitalist was asked to admit patient for further evaluation and management.  Review of Systems: Constitutional: Positive for chills and fever.  HENT: Negative for ear pain and sore throat.   Eyes: Negative for pain and visual disturbance.  Respiratory: Positive for cough with production of thick cloudy white phlegm,  negative for  chest tightness  Cardiovascular: Negative for chest pain and palpitations.  Gastrointestinal: Negative for abdominal pain and vomiting.  Endocrine: Negative for polyphagia and polyuria.  Genitourinary: Negative for decreased urine volume, dysuria, enuresis, hematuria Musculoskeletal: Positive for body aches.  Negative for arthralgias  Skin: Negative for color change and rash.  Allergic/Immunologic: Negative for immunocompromised state.  Neurological: Negative for tremors, syncope, speech difficulty, weakness, light-headedness and headaches.  Hematological: Does not bruise/bleed easily.  All other systems reviewed and are negative   Past Medical History:  Diagnosis Date  . Anemia   . Diabetes mellitus without complication (HCC)   . Hypertension   . Stroke [facility](HCC)    x2; no deficits  . Thyroid nodule 10/02/2017   Past Surgical History:  Procedure Laterality Date  . ENDOMETRIAL ABLATION N/A 01/30/2018   Procedure: ENDOMETRIAL ABLATION WITH MINERVA;  Surgeon: Lazaro ArmsEure, Luther H, MD;  Location: [facility];  Service: Gynecology;  Laterality: N/A;  . HYSTEROSCOPY WITH D & C N/A 01/30/2018   Procedure: DILATATION AND CURETTAGE /HYSTEROSCOPY;  Surgeon: Lazaro ArmsEure, Luther H, MD;  Location: [facility];  Service: Gynecology;  Laterality: N/A;  . LOOP RECORDER INSERTION N/A 06/27/2017   Procedure: Loop Recorder Insertion;  Surgeon: Regan Lemmingamnitz, Will Martin, MD;  Location: [facility];  Service: Cardiovascular;  Laterality: N/A;  . NERVE, TENDON AND ARTERY REPAIR Left 10/21/2019   Procedure: WOUND EXPLORATION LEFT HAND WITH REPAIR TENDON, ARTERY AND NERVE ;  Surgeon: Betha LoaKuzma, Kevin, MD;  Location: [facility];  Service: Orthopedics;  Laterality: Left;  . TEE WITHOUT CARDIOVERSION N/A 06/27/2017   Procedure: TRANSESOPHAGEAL ECHOCARDIOGRAM (TEE);  Surgeon: Chrystie NoseHilty, Kenneth C, MD;  [facility];  Service: Cardiovascular;  Laterality: N/A;  Social History:  reports that she has been smoking  cigarettes. She started smoking about 20 years ago. She has a 5.00 pack-year smoking history. She has never used smokeless tobacco. She reports that she does not drink alcohol and does not use drugs.   Allergies  Allergen Reactions  . Ace Inhibitors Cough    Family History  Problem Relation Age of Onset  . Stroke Maternal Grandfather     Prior to Admission medications   Medication Sig Start Date End Date Taking? Authorizing Provider  acetaminophen (TYLENOL) 325 MG tablet Take 650 mg by mouth every 6 (six) hours as needed for mild pain or moderate pain.    Yes [provider]  amLODipine (NORVASC) 10 MG tablet Take 1 tablet (10 mg total) by mouth daily. TAKE ONE TABLET BY MOUTH ONCE DAILY 04/29/20  Yes Grayce Sessions, NP  atorvastatin (LIPITOR) 40 MG tablet Take 1 tablet (40 mg total) by mouth at bedtime. Any generic statin from [employer] list is okay 04/29/20  Yes Grayce Sessions, NP  clopidogrel (PLAVIX) 75 MG tablet Take 1 tablet (75 mg total) by mouth daily. 06/01/20  Yes Grayce Sessions, NP  glipiZIDE (GLUCOTROL) 10 MG tablet Take 1 tablet (10 mg total) by mouth 2 (two) times daily before a meal. 04/29/20  Yes Grayce Sessions, NP  insulin glargine (LANTUS) 100 UNIT/ML injection Inject 0.4 mLs (40 Units total) into the skin daily. 04/29/20  Yes Grayce Sessions, NP  losartan (COZAAR) 100 MG tablet Take 1 tablet (100 mg total) by mouth daily. 04/29/20  Yes Grayce Sessions, NP  sitaGLIPtin (JANUVIA) 100 MG tablet Take 1 tablet (100 mg total) by mouth daily. 04/29/20  Yes Grayce Sessions, NP  hydrALAZINE (APRESOLINE) 50 MG tablet Take 1 tablet (50 mg total) by mouth 3 (three) times daily. Patient not taking: Reported on 06/20/2020 04/29/20 06/20/20  Grayce Sessions, NP  INSULIN SYRINGE 1CC/29G (B-D INSULIN SYRINGE) 29G X 1/2" 1 ML MISC 1 application by Does not apply route at bedtime. Patient not taking: Reported on 06/20/2020 06/01/20   Grayce Sessions, NP     Physical Exam: BP (!) 163/94   Pulse (!) 107   Temp 99.7 F (37.6 C) (Oral)   Resp (!) 22   Ht 5\' 2"  (1.575 m)   Wt 65.8 kg   SpO2 93%   BMI 26.52 kg/m   . General: 54 y.o. year-old female well developed well nourished in no acute distress.  Alert and oriented x3. 44 HEENT: Dry mucous membrane.  NCAT, EOMI, PERRLA . Neck: Supple, trachea medial . Cardiovascular: Tachycardia.  Regular rate and rhythm with no rubs or gallops.  No thyromegaly or JVD noted.  No lower extremity edema. 2/4 pulses in all 4 extremities. Marland Kitchen Respiratory: Tachypnea.  Clear to auscultation with no wheezes or rales. Good inspiratory effort. . Abdomen: Soft nontender nondistended with normal bowel sounds x4 quadrants. . Muskuloskeletal: Bilateral tenderness to palpation of lumbar paraspinal muscles.  No cyanosis, clubbing or edema noted bilaterally . Neuro: CN II-XII intact, strength, sensation, reflexes . Skin: No ulcerative lesions noted or rashes . Psychiatry: Judgement and insight appear normal. Mood is appropriate for condition and setting          Labs on Admission:  Basic Metabolic Panel: Recent Labs  Lab 06/20/20 1603  NA 133*  K 3.8  CL 99  CO2 23  GLUCOSE 303*  BUN 17  CREATININE 0.92  CALCIUM 9.1   Liver  Function Tests: Recent Labs  Lab 06/20/20 1603  AST 26  ALT 22  ALKPHOS 56  BILITOT 0.5  PROT 7.4  ALBUMIN 3.3*   No results for input(s): LIPASE, AMYLASE in the last 168 hours. No results for input(s): AMMONIA in the last 168 hours. CBC: Recent Labs  Lab 06/20/20 1603  WBC 4.2  NEUTROABS 3.0  HGB 16.5*  HCT 49.5*  MCV 95.9  PLT 176   Cardiac Enzymes: No results for input(s): CKTOTAL, CKMB, CKMBINDEX, TROPONINI in the last 168 hours.  BNP (last 3 results) No results for input(s): BNP in the last 8760 hours.  ProBNP (last 3 results) No results for input(s): PROBNP in the last 8760 hours.  CBG: No results for input(s): GLUCAP in the last 168  hours.  Radiological Exams on Admission: DG Chest 2 View  Result Date: 06/20/2020 CLINICAL DATA:  Cough, fever. EXAM: CHEST - 2 VIEW COMPARISON:  May 21, 2019. FINDINGS: The heart size and mediastinal contours are within normal limits. No pneumothorax or pleural effusion is noted. Bibasilar ill-defined opacities are noted concerning for multifocal pneumonia, left greater than right. The visualized skeletal structures are unremarkable. IMPRESSION: Bibasilar pneumonia, left greater than right. Electronically Signed   By: Lupita Raider M.D.   On: 06/20/2020 14:23    EKG: I independently viewed the EKG done and my findings are as followed: No EKG was done in the ED.  Assessment/Plan Present on Admission: . Pneumonia due to COVID-19 virus . Essential hypertension . Hyperglycemia . Hyperlipidemia  Principal Problem:   Pneumonia due to COVID-19 virus Active Problems:   Essential hypertension   Hyperglycemia   DM2 (diabetes mellitus, type 2) (HCC)   Hyperlipidemia   Dehydration   Hyponatremia   Acute respiratory failure with hypoxia possibly secondary to pneumonia due to COVID-19 virus Patient was noted to be hypoxic with O2 sat in the high 80s at bedside per ED PA and patient was provided with supplemental oxygen via [place] at 2 LPM Chest x-ray showed bibasilar pneumonia, left greater than right.   Continue albuterol Q6H Continue IV Decadron 6 mg daily Continue Protonix to prevent steroid-induced stress ulcer Remdesivir per pharmacy protocol Continue vit. C 500 mg and Zn 220 mg daily Continue mucolytics and antitussives CRP 11.2, D-dimer 1.23, LDH 289, fibrinogen 686, Continue to obtain daily COVID inflammatory markers and adjust accordingly Continue supplemental oxygen via [place] to obtain SPO2 > 93%. Continue airborne precaution. Encourage proning as tolerated Procalcitonin will be checked to rule out any bacterial component of the pneumonia  Hyperglycemia secondary to type II  mellitus Continue insulin sliding scale and hypoglycemia protocol  Pseudohyponatremia Na 133, corrected Na level based on blood glucose level (303)=  136 Continue to monitor glucose level  Dehydration Patient will be provided with gentle hydration  Essential hypertension Continue amlodipine per home regimen  Hyperlipidemia Continue Lipitor per home regimen  DVT prophylaxis: Lovenox  Code Status: Full code  Family Communication: None at bedside  Disposition Plan:  Patient is from:                        home Anticipated DC to:                   SNF or family members home Anticipated DC date:               2-3 days Anticipated DC barriers:          Unstable to  discharge at this time due to patient requiring oxygen secondary to COVID-19 pneumonia and requiring IV COVID-19 treatment at this time   Consults called: None  Admission status: Inpatient    Frankey Shown MD [facility]  If 7PM-7AM, please contact night-coverage [email or web address]  06/20/2020, 8:14 PM

## 2020-06-20 NOTE — ED Provider Notes (Signed)
[facility] EMERGENCY DEPARTMENT Provider Note   CSN: [id number] Arrival date & time: 06/20/20  1259     History Chief Complaint  Patient presents with  . Cough    Tammy Bauer is a 54 y.o. female with a history of tobacco abuse, hypertension, hyperlipidemia, T2DM, prior ischemic stroke, & anemia who presents to the ED with complaints of cough for the past 9 days.  Patient states that she has had a cough that is intermittently productive of phlegm sputum with intermittent dyspnea, fever, chills, body aches especially in the lower back, and poor appetite.  She is concerned she is dehydrated as her urine has been dark.  Taking Tylenol at home without much relief of her symptoms.  No alleviating or aggravating factors.  She recently was around individuals who tested positive for COVID-19.  She has not received her Covid vaccine.  She denies chest pain, dizziness, syncope, vomiting, diarrhea, or abdominal pain.  HPI     Past Medical History:  Diagnosis Date  . Anemia   . Diabetes mellitus without complication (HCC)   . Hypertension   . Stroke [facility])    x2; no deficits  . Thyroid nodule 10/02/2017    Patient Active Problem List   Diagnosis Date Noted  . Diplopia 05/22/2019  . Headache 05/22/2019  . Hyperlipidemia 05/22/2019  . TIA (transient ischemic attack) 05/21/2019  . Vaginal bleeding 01/03/2018  . Acute blood loss anemia 01/03/2018  . Anemia 01/03/2018  . Acute ischemic stroke (HCC) 10/02/2017  . DM2 (diabetes mellitus, type 2) (HCC) 10/02/2017  . Acute CVA (cerebrovascular accident) (HCC) 06/25/2017  . Hypertensive emergency 06/25/2017  . Tobacco abuse 06/25/2017  . Essential hypertension 10/07/2015  . Hyperglycemia 10/07/2015    Past Surgical History:  Procedure Laterality Date  . ENDOMETRIAL ABLATION N/A 01/30/2018   Procedure: ENDOMETRIAL ABLATION WITH MINERVA;  Surgeon: Lazaro Arms, MD;  Location: [facility] ORS;  Service: Gynecology;  Laterality: N/A;  .  HYSTEROSCOPY WITH D & C N/A 01/30/2018   Procedure: DILATATION AND CURETTAGE /HYSTEROSCOPY;  Surgeon: Lazaro Arms, MD;  Location: [facility] ORS;  Service: Gynecology;  Laterality: N/A;  . LOOP RECORDER INSERTION N/A 06/27/2017   Procedure: Loop Recorder Insertion;  Surgeon: Regan Lemming, MD;  Location: [facility] INVASIVE CV LAB;  Service: Cardiovascular;  Laterality: N/A;  . NERVE, TENDON AND ARTERY REPAIR Left 10/21/2019   Procedure: WOUND EXPLORATION LEFT HAND WITH REPAIR TENDON, ARTERY AND NERVE ;  Surgeon: Betha Loa, MD;  Location: [facility];  Service: Orthopedics;  Laterality: Left;  . TEE WITHOUT CARDIOVERSION N/A 06/27/2017   Procedure: TRANSESOPHAGEAL ECHOCARDIOGRAM (TEE);  Surgeon: [name], MD;  Location: [facility] ENDOSCOPY;  Service: Cardiovascular;  Laterality: N/A;     OB History   No obstetric history on file.     Family History  Problem Relation Age of Onset  . Stroke Maternal Grandfather     Social History   Tobacco Use  . Smoking status: Current Every Day Smoker    Packs/day: 0.25    Years: 20.00    Pack years: 5.00    Types: Cigarettes    Start date: 08/29/1999  . Smokeless tobacco: Never Used  . Tobacco comment: smoke 5 cigarettes a day  Vaping Use  . Vaping Use: Never used  Substance Use Topics  . Alcohol use: No  . Drug use: No    Home Medications Prior to Admission medications   Medication Sig Start Date End Date Taking? Authorizing Provider  acetaminophen (TYLENOL) 325 MG tablet Take 650 mg by mouth every 6 (six) hours as needed for mild pain or moderate pain.     [provider]  amLODipine (NORVASC) 10 MG tablet Take 1 tablet (10 mg total) by mouth daily. TAKE ONE TABLET BY MOUTH ONCE DAILY 04/29/20   Grayce SessionsEdwards, Michelle P, NP  atorvastatin (LIPITOR) 40 MG tablet Take 1 tablet (40 mg total) by mouth at bedtime. Any generic statin from [employer] list is okay 04/29/20   Grayce SessionsEdwards, Michelle P, NP  clopidogrel (PLAVIX) 75 MG tablet Take  1 tablet (75 mg total) by mouth daily. 06/01/20   Grayce SessionsEdwards, Michelle P, NP  glipiZIDE (GLUCOTROL) 10 MG tablet Take 1 tablet (10 mg total) by mouth 2 (two) times daily before a meal. 04/29/20   Grayce SessionsEdwards, Michelle P, NP  hydrALAZINE (APRESOLINE) 50 MG tablet Take 1 tablet (50 mg total) by mouth 3 (three) times daily. 04/29/20 06/01/20  Grayce SessionsEdwards, Michelle P, NP  insulin glargine (LANTUS) 100 UNIT/ML injection Inject 0.4 mLs (40 Units total) into the skin daily. 04/29/20   Grayce SessionsEdwards, Michelle P, NP  INSULIN SYRINGE 1CC/29G (B-D INSULIN SYRINGE) 29G X 1/2" 1 ML MISC 1 application by Does not apply route at bedtime. 06/01/20   Grayce SessionsEdwards, Michelle P, NP  losartan (COZAAR) 100 MG tablet Take 1 tablet (100 mg total) by mouth daily. 04/29/20   Grayce SessionsEdwards, Michelle P, NP  sitaGLIPtin (JANUVIA) 100 MG tablet Take 1 tablet (100 mg total) by mouth daily. 04/29/20   Grayce SessionsEdwards, Michelle P, NP    Allergies    Ace inhibitors  Review of Systems   Review of Systems  Constitutional: Positive for chills and fever.  Respiratory: Positive for cough.   Cardiovascular: Negative for chest pain.  Gastrointestinal: Negative for abdominal pain, blood in stool, constipation, diarrhea, nausea and vomiting.  Genitourinary: Negative for dysuria, frequency and urgency.  Musculoskeletal: Positive for back pain and myalgias.  Neurological: Negative for dizziness and syncope.  All other systems reviewed and are negative.   Physical Exam Updated Vital Signs BP (!) 151/97 (BP Location: Right Arm)   Pulse (!) 124   Temp (!) 101.8 F (38.8 C) (Oral)   Resp (!) 22   Ht 5\' 2"  (1.575 m)   Wt 65.8 kg   SpO2 94%   BMI 26.52 kg/m   Physical Exam Vitals and nursing note reviewed.  Constitutional:      General: She is not in acute distress.    Appearance: She is well-developed.  HENT:     Head: Normocephalic and atraumatic.     Right Ear: Ear canal normal. Tympanic membrane is not perforated, erythematous, retracted or bulging.     Left  Ear: Ear canal normal. Tympanic membrane is not perforated, erythematous, retracted or bulging.     Ears:     Comments: No mastoid erythema/swelling/tenderness.     Nose:     Right Sinus: No maxillary sinus tenderness or frontal sinus tenderness.     Left Sinus: No maxillary sinus tenderness or frontal sinus tenderness.     Mouth/Throat:     Mouth: Mucous membranes are dry.     Pharynx: Uvula midline. No oropharyngeal exudate or posterior oropharyngeal erythema.     Comments: Posterior oropharynx is symmetric appearing. Patient tolerating own secretions without difficulty. No trismus. No drooling. No hot potato voice. No swelling beneath the tongue, submandibular compartment is soft.  Eyes:     General:        Right eye: No discharge.  Left eye: No discharge.     Conjunctiva/sclera: Conjunctivae normal.     Pupils: Pupils are equal, round, and reactive to light.  Cardiovascular:     Rate and Rhythm: Regular rhythm. Tachycardia present.     Heart sounds: No murmur heard.   Pulmonary:     Effort: Tachypnea present. No respiratory distress.     Breath sounds: No wheezing, rhonchi or rales.     Comments: SpO2 initially 93% on RA.  Abdominal:     General: There is no distension.     Palpations: Abdomen is soft.     Tenderness: There is no abdominal tenderness. There is no guarding or rebound.  Musculoskeletal:     Cervical back: Normal range of motion and neck supple. No edema or rigidity.     Comments: Diffuse tenderness to the lumbar paraspinal muscles.  No point/focal vertebral tenderness.  Lymphadenopathy:     Cervical: No cervical adenopathy.  Skin:    General: Skin is warm and dry.     Findings: No rash.  Neurological:     Mental Status: She is alert.  Psychiatric:        Behavior: Behavior normal.     ED Results / Procedures / Treatments   Labs (all labs ordered are listed, but only abnormal results are displayed) Labs Reviewed  COMPREHENSIVE METABOLIC PANEL -  Abnormal; Notable for the following components:      Result Value   Sodium 133 (*)    Glucose, Bld 303 (*)    Albumin 3.3 (*)    All other components within normal limits  CBC WITH DIFFERENTIAL/PLATELET - Abnormal; Notable for the following components:   RBC 5.16 (*)    Hemoglobin 16.5 (*)    HCT 49.5 (*)    All other components within normal limits  SARS CORONAVIRUS 2 BY RT PCR (HOSPITAL ORDER, PERFORMED IN [place])  URINALYSIS, ROUTINE W REFLEX MICROSCOPIC    EKG None  Radiology DG Chest 2 View  Result Date: 06/20/2020 CLINICAL DATA:  Cough, fever. EXAM: CHEST - 2 VIEW COMPARISON:  May 21, 2019. FINDINGS: The heart size and mediastinal contours are within normal limits. No pneumothorax or pleural effusion is noted. Bibasilar ill-defined opacities are noted concerning for multifocal pneumonia, left greater than right. The visualized skeletal structures are unremarkable. IMPRESSION: Bibasilar pneumonia, left greater than right. Electronically Signed   By: Lupita Raider M.D.   On: 06/20/2020 14:23    Procedures .Critical Care Performed by: Cherly Anderson, PA-C Authorized by: Cherly Anderson, PA-C     (including critical care time)  CRITICAL CARE Performed by: Harvie Heck   Total critical care time: 30 minutes  Critical care time was exclusive of separately billable procedures and treating other patients.  Critical care was necessary to treat or prevent imminent or life-threatening deterioration.  Critical care was time spent personally by me on the following activities: development of treatment plan with patient and/or surrogate as well as nursing, discussions with consultants, evaluation of patient's response to treatment, examination of patient, obtaining history from patient or surrogate, ordering and performing treatments and interventions, ordering and review of laboratory studies, ordering and review of radiographic studies,  pulse oximetry and re-evaluation of patient's condition.  Medications Ordered in ED Medications  acetaminophen (TYLENOL) tablet 325 mg (0 mg Oral Hold 06/20/20 1736)  dexamethasone (DECADRON) injection 6 mg (has no administration in time range)  ibuprofen (ADVIL) tablet 600 mg (has no administration in time range)  enoxaparin (LOVENOX) injection 40 mg (has no administration in time range)  albuterol (VENTOLIN HFA) 108 (90 Base) MCG/ACT inhaler 2 puff (has no administration in time range)  guaiFENesin-dextromethorphan (ROBITUSSIN DM) 100-10 MG/5ML syrup 10 mL (has no administration in time range)  chlorpheniramine-HYDROcodone (TUSSIONEX) 10-8 MG/5ML suspension 5 mL (has no administration in time range)  pantoprazole (PROTONIX) EC tablet 40 mg (has no administration in time range)  ondansetron (ZOFRAN) tablet 4 mg (has no administration in time range)    Or  ondansetron (ZOFRAN) injection 4 mg (has no administration in time range)  ascorbic acid (VITAMIN C) tablet 500 mg (has no administration in time range)  zinc sulfate capsule 220 mg (has no administration in time range)  acetaminophen (TYLENOL) tablet 650 mg (650 mg Oral Given 06/20/20 1325)  sodium chloride 0.9 % bolus 500 mL (0 mLs Intravenous Stopped 06/20/20 1756)  albuterol (VENTOLIN HFA) 108 (90 Base) MCG/ACT inhaler 2 puff (2 puffs Inhalation Given 06/20/20 1537)  AeroChamber Plus Flo-Vu Medium MISC 1 each (1 each Other Given 06/20/20 1737)    ED Course  I have reviewed the triage vital signs and the nursing notes.  Pertinent labs & imaging results that were available during my care of the patient were reviewed by me and considered in my medical decision making (see chart for details).  Tammy Bauer was evaluated in Emergency Department on 06/20/2020 for the symptoms described in the history of present illness. He/she was evaluated in the context of the global COVID-19 pandemic, which necessitated consideration that the patient  might be at risk for infection with the SARS-CoV-2 virus that causes COVID-19. Institutional protocols and algorithms that pertain to the evaluation of patients at risk for COVID-19 are in a state of rapid change based on information released by regulatory bodies including the CDC and federal and state organizations. These policies and algorithms were followed during the patient's care in the ED.    MDM Rules/Calculators/A&P                         Patient presents to the ED with complaints of cough & fever x 9 days.  Patient noted to be febrile on arrival with likely resultant tachycardia/tachypnea.  Exam otherwise fairly benign.   DDx: COVID-19, community-acquired pneumonia, pulmonary embolism, CHF  Additional history obtained:  Additional history obtained from chart & nursing note review.   Lab Tests:  I Ordered, reviewed, and interpreted labs, which included:  CBC: No anemia or leukocytosis.  Mildly elevated hemoglobin/hematocrit. CMP: Hyperglycemia without acidosis or anion gap elevation.  Renal function within normal limits. Covid testing: Positive  Imaging Studies ordered:  CXR ordered per triage protocol, I independently visualized and interpreted imaging which showed Bibasilar pneumonia, left greater than right.   19:00:RE-EVAL: On reassessment patient feels like her fever is coming back, suspect this is the case that she is tachycardic again into the 120s.  She is also complaining of a headache.  Her SPO2 is consistently 89% on room air at rest, placed on 2L via [place].  At this time feel she requires admissions for acute hypoxic respiratory failure in the setting of COVID-19.  We will give her steroids and consult the hospitalist service for admission.  19:19: CONSULT: Discussed case with hospitalist Dr.Adefeso- accepts admission.   Portions of this note were generated with Scientist, clinical (histocompatibility and immunogenetics). Dictation errors may occur despite best attempts at proofreading.  Final Clinical  Impression(s) / ED Diagnoses Final diagnoses:  None    Rx / DC Orders ED Discharge Orders    None       Desmond Lope 06/20/20 1936    Terald Sleeper, MD 06/22/20 [phone]

## 2020-06-20 NOTE — ED Triage Notes (Signed)
Patient presents to the ED with cough, chills and fever for 9 days.

## 2020-06-21 LAB — CBC WITH DIFFERENTIAL/PLATELET
Abs Immature Granulocytes: 0.01 10*3/uL (ref 0.00–0.07)
Basophils Absolute: 0 10*3/uL (ref 0.0–0.1)
Basophils Relative: 0 %
Eosinophils Absolute: 0 10*3/uL (ref 0.0–0.5)
Eosinophils Relative: 0 %
HCT: 45.9 % (ref 36.0–46.0)
Hemoglobin: 15.1 g/dL — ABNORMAL HIGH (ref 12.0–15.0)
Immature Granulocytes: 0 %
Lymphocytes Relative: 21 %
Lymphs Abs: 0.7 10*3/uL (ref 0.7–4.0)
MCH: 32.1 pg (ref 26.0–34.0)
MCHC: 32.9 g/dL (ref 30.0–36.0)
MCV: 97.7 fL (ref 80.0–100.0)
Monocytes Absolute: 0.2 10*3/uL (ref 0.1–1.0)
Monocytes Relative: 6 %
Neutro Abs: 2.3 10*3/uL (ref 1.7–7.7)
Neutrophils Relative %: 73 %
Platelets: 306 10*3/uL (ref 150–400)
RBC: 4.7 MIL/uL (ref 3.87–5.11)
RDW: 12.9 % (ref 11.5–15.5)
WBC: 3.1 10*3/uL — ABNORMAL LOW (ref 4.0–10.5)
nRBC: 0 % (ref 0.0–0.2)

## 2020-06-21 LAB — COMPREHENSIVE METABOLIC PANEL
ALT: 19 U/L (ref 0–44)
AST: 25 U/L (ref 15–41)
Albumin: 2.8 g/dL — ABNORMAL LOW (ref 3.5–5.0)
Alkaline Phosphatase: 49 U/L (ref 38–126)
Anion gap: 11 (ref 5–15)
BUN: 23 mg/dL — ABNORMAL HIGH (ref 6–20)
CO2: 22 mmol/L (ref 22–32)
Calcium: 8.4 mg/dL — ABNORMAL LOW (ref 8.9–10.3)
Chloride: 101 mmol/L (ref 98–111)
Creatinine, Ser: 0.93 mg/dL (ref 0.44–1.00)
GFR calc Af Amer: >60 mL/min (ref >60)
GFR calc non Af Amer: >60 mL/min (ref >60)
Glucose, Bld: 351 mg/dL — ABNORMAL HIGH (ref 70–99)
Potassium: 4.4 mmol/L (ref 3.5–5.1)
Sodium: 134 mmol/L — ABNORMAL LOW (ref 135–145)
Total Bilirubin: 0.7 mg/dL (ref 0.3–1.2)
Total Protein: 6.6 g/dL (ref 6.5–8.1)

## 2020-06-21 LAB — MAGNESIUM: Magnesium: 2.3 mg/dL (ref 1.7–2.4)

## 2020-06-21 LAB — PHOSPHORUS: Phosphorus: 3.6 mg/dL (ref 2.5–4.6)

## 2020-06-21 LAB — D-DIMER, QUANTITATIVE: D-Dimer, Quant: 1.54 ug/mL-FEU — ABNORMAL HIGH (ref 0.00–0.50)

## 2020-06-21 LAB — CBG MONITORING, ED
Glucose-Capillary: 311 mg/dL — ABNORMAL HIGH (ref 70–99)
Glucose-Capillary: 269 mg/dL — ABNORMAL HIGH (ref 70–99)

## 2020-06-21 LAB — C-REACTIVE PROTEIN: CRP: 12 mg/dL — ABNORMAL HIGH (ref <1.0)

## 2020-06-21 LAB — GLUCOSE, CAPILLARY
Glucose-Capillary: 320 mg/dL — ABNORMAL HIGH (ref 70–99)
Glucose-Capillary: 227 mg/dL — ABNORMAL HIGH (ref 70–99)

## 2020-06-21 LAB — HIV ANTIBODY (ROUTINE TESTING W REFLEX): HIV Screen 4th Generation wRfx: NONREACTIVE

## 2020-06-21 LAB — FERRITIN: Ferritin: 269 ng/mL (ref 11–307)

## 2020-06-21 MED ORDER — ATORVASTATIN CALCIUM 40 MG PO TABS
40.0000 mg | ORAL_TABLET | Freq: Every day | ORAL | Status: DC
  Administered 2020-06-21: 40 mg via ORAL
  Filled 2020-06-21: qty 1

## 2020-06-21 MED ORDER — ENOXAPARIN SODIUM 30 MG/0.3ML ~~LOC~~ SOLN: 30.0000 mg | Freq: Two times a day (BID) | SUBCUTANEOUS | Status: DC

## 2020-06-21 MED ORDER — CLOPIDOGREL BISULFATE 75 MG PO TABS
75.0000 mg | ORAL_TABLET | Freq: Every day | ORAL | Status: DC
  Administered 2020-06-21 – 2020-06-22 (×2): 75 mg via ORAL
  Filled 2020-06-21 (×2): qty 1

## 2020-06-21 MED ORDER — SODIUM CHLORIDE 0.9 % IV SOLN
100.0000 mg | INTRAVENOUS | Status: AC
  Administered 2020-06-21 (×2): 100 mg via INTRAVENOUS
  Filled 2020-06-21 (×4): qty 20

## 2020-06-21 MED ORDER — AMLODIPINE BESYLATE 5 MG PO TABS
10.0000 mg | ORAL_TABLET | Freq: Every day | ORAL | Status: DC
  Administered 2020-06-21 – 2020-06-22 (×2): 10 mg via ORAL
  Filled 2020-06-21 (×2): qty 2

## 2020-06-21 NOTE — Progress Notes (Addendum)
Inpatient Diabetes Program Recommendations  AACE/ADA: New Consensus Statement on Inpatient Glycemic Control  Target Ranges:  Prepandial:   less than 140 mg/dL      Peak postprandial:   less than 180 mg/dL (1-2 hours)      Critically ill patients:  140 - 180 mg/dL  Results for KIMMORA, RISENHOOVER (MRN [id number]) as of 06/21/2020 13:13  Ref. Range 06/21/2020 09:27 06/21/2020 11:55  Glucose-Capillary Latest Ref Range: 70 - 99 mg/dL 852 (H) 778 (H)   Results for MELLINA, BENISON (MRN [id number]) as of 06/21/2020 09:55  Ref. Range 06/20/2020 16:03 06/21/2020 07:39  Glucose Latest Ref Range: 70 - 99 mg/dL 431 (H) 540 (H)  Results for SYLVANNA, BURGGRAF (MRN [id number]) as of 06/21/2020 09:55  Ref. Range 04/29/2020 10:13  Hemoglobin A1C Latest Ref Range: 4.0 - 5.6 % 10.0 (A)   Review of Glycemic Control  Diabetes history: DM2 Outpatient Diabetes medications: Lantus 25-35 units QHS, Glipizide 10 mg BID, Januvia 100 mg daily Current orders for Inpatient glycemic control: Novolog 0-15 units TID with meals, Novolog 0-5 units QHS; Decadron 6 mg Q24H  Inpatient Diabetes Program Recommendations:    Insulin-Basal: Please consider ordering Lantus 15 units Q24H.  NOTE: In reviewing chart, noted patient has no insurance listed and TOC note today states patient does not have a PCP. Noted patient goes to [facility] (had televisit with Westley Hummer, NP on 06/01/20 and an office visit on 04/29/20 with Westley Hummer, NP for DM2 and A1C was 10% on 04/29/20). Initial glucose 303 mg/dl on 9/32/67 and patient is currently ordered Decadron 6 mg Q24H. Glucose 311 mg/dl today at 1:24 am.  Addendum 06/21/20@12 :62- Spoke with patient over the phone regarding DM and DM medications. Patient states that she is goes to [facility] for primary care.  Patient states that she takes Lantus 25-35 units QHS (if CBG less than 200 mg/dl she takes 58-09 units and if over 200 mg/dl  takes 98-33 units depending on actual value), Glipizide 10 mg BID, and Januvia 100 mg daily for DM control.   Patient notes that she does not take the Januvia consistently and she reports that she was started on [place] at last office visit in May with NP.  Patient reports that her glucose has been running mid 100's -250 mg/dl over the past 2 weeks. Patient notes that her glucose has not been as elevated over the past few days and she has not been eating much due to not being able to taste. Patient reports that she is feeling like she is getting an appetite back. Patient states that she is able to get all DM medications without any issues and she has all needed DM medications and supplies. Reviewed prior A1 10% on 04/29/20 and as patient noted above, she was started on Januvia in May.  Encouraged patient to work with provider on improving DM control and to take DM medications consistently. Discussed current insulin orders and explained that she is ordered Decadron. Discussed how steroids impact glycemic control. Informed patient that it would be requested and MD order basal insulin.  Patient verbalized understanding of information discussed and she states that she has no questions at this time related to DM.  Thanks, Orlando Penner, RN, MSN, CDE Diabetes Coordinator Inpatient Diabetes Program [phone] (Team Pager from 8am to 5pm)

## 2020-06-21 NOTE — Progress Notes (Signed)
Patient Demographics:    Tammy Bauer, is a 54 y.o. female, DOB - 1966/03/28, ONG:[id number]  Admit date - 06/20/2020   Admitting Physician Frankey Shownladapo Adefeso, DO  Outpatient Primary MD for the patient is Grayce SessionsEdwards, Michelle P, NP  LOS - 1   Chief Complaint  Patient presents with  . Cough        Subjective:    Tammy Bauer today has no fevers, no emesis,  No chest pain,   -Cough and dyspnea persist, -Hypoxia improving -Remains tachycardic  Assessment  & Plan :    Principal Problem:   Pneumonia due to COVID-19 virus Active Problems:   Essential hypertension   Hyperglycemia   DM2 (diabetes mellitus, type 2) (HCC)   Hyperlipidemia   Dehydration   Hyponatremia   Acute respiratory failure with hypoxia [facility](HCC)  Brief Summary:- 54 y.o. female with medical history significant for hypertension, hyperlipidemia, T2DM, CVA (10/02/2017), L MCA bifurcation aneurysm, thyroid nodule (right), hx of vaginal bleeding andanemia  admitted on 06/20/2018 with Covid pneumonia and acute hypoxic respiratory failure secondary to same -Patient is unvaccinated against COVID-19 virus  A/p 1)Acute hypoxic respiratory failure secondary to COVID-19 infection/pneumonia--- The treatment plan and use of medications  for treatment of COVID-19 infection and possible side effects were discussed with patient     --Patient verbalizes understanding and agrees to treatment protocols   --Patient is positive for COVID-19 infection, chest x-ray with findings of infiltrates/opacities,  patient is hypoxic and requiring continuous supplemental oxygen---patient meets criteria for initiation of Remdesivir AND Decadron/Steroid therapy per protocol  --Check and trend fibrinogen, CRP, pro calcitonin, CBC, BMP, d-dimer, LDH, ferritin and LFTs --Supplemental oxygen to keep O2 sats above 93% -Follow serial chest x-rays and ABGs as indicated --- Encourage  prone positioning for More than 16 hours/day in increments of 2 to 3 hours at a time if able to tolerate --Attempt to maintain euvolemic state --Zinc and vitamin C as ordered -Today's remdesivir day 2 -Albuterol inhaler as needed -Hypoxia is improving, continue to wean off O2   2)DM2-anticipate worsening hyperglycemia due to steroids, Use Novolog/Humalog Sliding scale insulin with Accu-Cheks/Fingersticks as ordered    3)HTN--okay to continue amlodipine  Disposition/Need for in-Hospital Stay- patient unable to be discharged at this time due to --acute hypoxic respiratory failure secondary to COVID-19 pneumonia requiring IV remdesivir and IV steroids with supplemental oxygen  Status is: Inpatient  Remains inpatient appropriate because:acute hypoxic respiratory failure secondary to COVID-19 pneumonia requiring IV remdesivir and IV steroids with supplemental oxygen   Disposition: The patient is from: Home              Anticipated d/c is to: Home              Anticipated d/c date is: 1 day              Patient currently is not medically stable to d/c. Barriers: Not Clinically Stable- -acute hypoxic respiratory failure secondary to COVID-19 pneumonia requiring IV remdesivir and IV steroids with supplemental oxygen   Code Status : full  Family Communication:   (patient is alert, awake and coherent)   Consults  :  na  DVT Prophylaxis  :  Lovenox -   - SCDs   Lab Results  Component Value Date   PLT 306 06/21/2020    Inpatient Medications  Scheduled Meds: . acetaminophen  325 mg Oral Once  . albuterol  2 puff Inhalation Q6H  . amLODipine  10 mg Oral Daily  . vitamin C  500 mg Oral Daily  . atorvastatin  40 mg Oral QHS  . clopidogrel  75 mg Oral Daily  . dexamethasone (DECADRON) injection  6 mg Intravenous Q24H  . dextromethorphan-guaiFENesin  1 tablet Oral BID  . enoxaparin (LOVENOX) injection  40 mg Subcutaneous Q24H  . insulin aspart  0-15 Units Subcutaneous TID WC  .  insulin aspart  0-5 Units Subcutaneous QHS  . pantoprazole  40 mg Oral Daily  . zinc sulfate  220 mg Oral Daily   Continuous Infusions: . remdesivir 100 mg in NS 100 mL Stopped (06/21/20 1317)   PRN Meds:.acetaminophen, chlorpheniramine-HYDROcodone, guaiFENesin-dextromethorphan, ondansetron **OR** ondansetron (ZOFRAN) IV    Anti-infectives (From admission, onward)   Start     Dose/Rate Route Frequency Ordered Stop   06/21/20 1000  remdesivir 100 mg in sodium chloride 0.9 % 100 mL IVPB     Discontinue     100 mg 200 mL/hr over 30 Minutes Intravenous Daily 06/20/20 2127 06/25/20 0959   06/21/20 0145  remdesivir 100 mg in sodium chloride 0.9 % 100 mL IVPB        100 mg 200 mL/hr over 30 Minutes Intravenous Every 30 min 06/21/20 0112 06/21/20 0527   06/20/20 2300  remdesivir 100 mg in sodium chloride 0.9 % 100 mL IVPB        100 mg 200 mL/hr over 30 Minutes Intravenous Every 30 min 06/20/20 2127 06/20/20 2359        Objective:   Vitals:   06/21/20 1151 06/21/20 1152 06/21/20 1505 06/21/20 1615  BP:    137/82  Pulse: (!) 113 (!) 111 (!) 108 (!) 106  Resp:   (!) 31 (!) 25  Temp:    98.6 F (37 C)  TempSrc:    Oral  SpO2: 92% 91% 93% 93%  Weight:      Height:        Wt Readings from Last 3 Encounters:  06/20/20 65.8 kg  04/29/20 68.4 kg  10/21/19 66.1 kg     Intake/Output Summary (Last 24 hours) at 06/21/2020 1938 Last data filed at 06/21/2020 1010 Gross per 24 hour  Intake 100 ml  Output --  Net 100 ml     Physical Exam  Gen:- Awake Alert, able to speak in short sentences HEENT:- .AT, No sclera icterus Nose-NC2 l/min Neck-Supple Neck,No JVD,.  Lungs-diminished bilaterally with few scattered wheezes  CV- S1, S2 normal, regular , tachycardic with heart rate in the 110s Abd-  +ve B.Sounds, Abd Soft, No tenderness,    Extremity/Skin:- No  edema, pedal pulses present Psych-affect is appropriate, oriented x3 Neuro-no new focal deficits, no tremors   Data  Review:   Micro Results Recent Results (from the past 240 hour(s))  SARS Coronavirus 2 by RT PCR (hospital order, performed in [facility] hospital lab) Nasopharyngeal Nasopharyngeal Swab     Status: Abnormal   Collection Time: 06/20/20  3:08 PM   Specimen: Nasopharyngeal Swab  Result Value Ref Range Status   SARS Coronavirus 2 POSITIVE (A) NEGATIVE Final    Comment: CRITICAL RESULT CALLED TO, READ BACK BY AND VERIFIED WITH: A TUTTLE AT 1724 ON 06/20/2020 BY MOSLEY,J  (NOTE) SARS-CoV-2 target nucleic acids are DETECTED  SARS-CoV-2 RNA is generally detectable  in upper respiratory specimens  during the acute phase of infection.  Positive results are indicative  of the presence of the identified virus, but do not rule out bacterial infection or co-infection with other pathogens not detected by the test.  Clinical correlation with patient history and  other diagnostic information is necessary to determine patient infection status.  The expected result is negative.  Fact Sheet for Patients:   BoilerBrush.com.cy   Fact Sheet for Healthcare Providers:   https://pope.com/    This test is not yet approved or cleared by the [place] FDA and  has been authorized for detection and/or diagnosis of SARS-CoV-2 by FDA under an Emergency Use Authorization (EUA).  This EUA will remain in effect  (meaning this test can be used) for the duration of  the COVID-19 declaration under Section 564(b)(1) of the Act, 21 U.S.C. section 360-bbb-3(b)(1), unless the authorization is terminated or revoked sooner.  Performed at [facility], [address] [place], [place] [address]   Blood Culture (routine x 2)     Status: None (Preliminary result)   Collection Time: 06/20/20  8:01 PM   Specimen: Right Antecubital; Blood  Result Value Ref Range Status   Specimen Description RIGHT ANTECUBITAL  Final   Special Requests   Final    BOTTLES DRAWN AEROBIC AND  ANAEROBIC Blood Culture adequate volume   Culture   Final    NO GROWTH < 12 HOURS Performed at [facility], [address], [place], [place] [address]    Report Status PENDING  Incomplete  Blood Culture (routine x 2)     Status: None (Preliminary result)   Collection Time: 06/20/20  8:15 PM   Specimen: BLOOD LEFT HAND  Result Value Ref Range Status   Specimen Description BLOOD LEFT HAND  Final   Special Requests   Final    BOTTLES DRAWN AEROBIC AND ANAEROBIC Blood Culture adequate volume   Culture   Final    NO GROWTH < 12 HOURS Performed at [facility], [address], [place], [place] [address]    Report Status PENDING  Incomplete    Radiology Reports DG Chest 2 View  Result Date: 06/20/2020 CLINICAL DATA:  Cough, fever. EXAM: CHEST - 2 VIEW COMPARISON:  May 21, 2019. FINDINGS: The heart size and mediastinal contours are within normal limits. No pneumothorax or pleural effusion is noted. Bibasilar ill-defined opacities are noted concerning for multifocal pneumonia, left greater than right. The visualized skeletal structures are unremarkable. IMPRESSION: Bibasilar pneumonia, left greater than right. Electronically Signed   By: Lupita Raider M.D.   On: 06/20/2020 14:23   CUP PACEART REMOTE DEVICE CHECK  Result Date: 06/14/2020 Carelink summary report received. Battery status OK. Normal device function. No new symptom episodes, tachy episodes, brady, or pause episodes. No new AF episodes. Monthly summary reports and ROV/PRN    CBC Recent Labs  Lab 06/20/20 1603 06/21/20 0739  WBC 4.2 3.1*  HGB 16.5* 15.1*  HCT 49.5* 45.9  PLT 176 306  MCV 95.9 97.7  MCH 32.0 32.1  MCHC 33.3 32.9  RDW 12.9 12.9  LYMPHSABS 1.0 0.7  MONOABS 0.2 0.2  EOSABS 0.0 0.0  BASOSABS 0.0 0.0    Chemistries  Recent Labs  Lab 06/20/20 1603 06/21/20 0739  NA 133* 134*  K 3.8 4.4  CL 99 101  CO2 23 22  GLUCOSE 303* 351*  BUN 17 23*  CREATININE 0.92 0.93  CALCIUM 9.1 8.4*  MG  --  2.3  AST 26 25  ALT 22 19  ALKPHOS 56 49  BILITOT 0.5 0.7   ------------------------------------------------------------------------------------------------------------------ Recent Labs    06/20/20 2002  TRIG 217*    Lab Results  Component Value Date   HGBA1C 10.0 (A) 04/29/2020   ------------------------------------------------------------------------------------------------------------------ No results for input(s): TSH, T4TOTAL, T3FREE, THYROIDAB in the last 72 hours.  Invalid input(s): FREET3 ------------------------------------------------------------------------------------------------------------------ Recent Labs    06/20/20 2002 06/21/20 0739  FERRITIN 285 269    Coagulation profile No results for input(s): INR, PROTIME in the last 168 hours.  Recent Labs    06/20/20 2001 06/21/20 0739  DDIMER 1.23* 1.54*    Cardiac Enzymes No results for input(s): CKMB, TROPONINI, MYOGLOBIN in the last 168 hours.  Invalid input(s): CK ------------------------------------------------------------------------------------------------------------------ No results found for: BNP   Shon Hale M.D on 06/21/2020 at 7:38 PM  Go to [email or web address] - for contact info  [facility] - Office  [phone]

## 2020-06-21 NOTE — TOC Initial Note (Addendum)
Transition of Care [facility]) - Initial/Assessment Note   Patient Details  Name: Tammy Bauer MRN: [id number] Date of Birth: [birth year]  [facility]) CM/SW Contact:    Tammy Schlein, LCSW Phone Number: 06/21/2020, 9:14 AM  Clinical Narrative: Patient is a 54 year old female who was admitted for pneumonia due to COVID-19. Per chart review, patient does not currently have insurance or a PCP. CSW spoke with patient over the phone to discuss referral to financial counselor to determine possible Medicaid eligibility. Patient reported she has not applied for Medicaid in several years, but was agreeable to a referral to the financial counselor. CSW made referral to the financial counselor, Jerene Dilling.  Addendum: Per Artist, patient's case has been referred to [facility] as there is a child under 71 years old in the home.         Expected Discharge Plan: Home/Self Care Barriers to Discharge: Continued Medical Work up  Expected Discharge Plan and Services Expected Discharge Plan: Home/Self Care In-house Referral: Clinical Social Work, Artist Living arrangements for the past 2 months: Single Family Home  Prior Living Arrangements/Services Living arrangements for the past 2 months: Single Family Home Lives with:: Spouse Patient language and need for interpreter reviewed:: Yes Do you feel safe going back to the place where you live?: Yes      Need for Family Participation in Patient Care: No (Comment) Care giver support system in place?: Yes (comment) Tammy Bauer (spouse)) Criminal Activity/Legal Involvement Pertinent to Current Situation/Hospitalization: No - Comment as needed  Permission Sought/Granted Permission sought to share information with : Other (comment) Research scientist (physical sciences)) Share Information with NAME: Jerene Dilling Permission granted to share info w Relationship: Artist  Emotional Assessment Appearance:: Appears stated  age Attitude/Demeanor/Rapport: Engaged Affect (typically observed): Accepting Orientation: : Oriented to Self, Oriented to Place, Oriented to  Time, Oriented to Situation Alcohol / Substance Use: Tobacco Use Psych Involvement: No (comment)  Admission diagnosis:  Pneumonia due to COVID-19 virus [U07.1, J12.82] Patient Active Problem List   Diagnosis Date Noted  . Pneumonia due to COVID-19 virus 06/20/2020  . Dehydration 06/20/2020  . Hyponatremia 06/20/2020  . Acute respiratory failure with hypoxia (HCC) 06/20/2020  . Diplopia 05/22/2019  . Headache 05/22/2019  . Hyperlipidemia 05/22/2019  . TIA (transient ischemic attack) 05/21/2019  . Vaginal bleeding 01/03/2018  . Acute blood loss anemia 01/03/2018  . Anemia 01/03/2018  . Acute ischemic stroke (HCC) 10/02/2017  . DM2 (diabetes mellitus, type 2) (HCC) 10/02/2017  . Acute CVA (cerebrovascular accident) (HCC) 06/25/2017  . Hypertensive emergency 06/25/2017  . Tobacco abuse 06/25/2017  . Essential hypertension 10/07/2015  . Hyperglycemia 10/07/2015   PCP:  Grayce Sessions, NP Pharmacy:   [facility] [place], [place] - [address] [place] [address] Phone: [phone] Fax: [phone]  [facility] [address] [place] - [address] [place] [address] [place] [address] Phone: [phone] Fax: [phone]  Readmission Risk Interventions No flowsheet data found.

## 2020-06-21 NOTE — Progress Notes (Signed)
Patient arrived from ED with a yellow mews due to high Hr and respirations. MD aware. Will continue q 4 vitals.

## 2020-06-22 DIAGNOSIS — J1282 Pneumonia due to coronavirus disease 2019: Secondary | ICD-10-CM

## 2020-06-22 DIAGNOSIS — U071 COVID-19: Principal | ICD-10-CM

## 2020-06-22 LAB — CBC WITH DIFFERENTIAL/PLATELET
Abs Immature Granulocytes: 0.02 10*3/uL (ref 0.00–0.07)
Basophils Absolute: 0 10*3/uL (ref 0.0–0.1)
Basophils Relative: 0 %
Eosinophils Absolute: 0 10*3/uL (ref 0.0–0.5)
Eosinophils Relative: 0 %
HCT: 42.8 % (ref 36.0–46.0)
Hemoglobin: 14.2 g/dL (ref 12.0–15.0)
Immature Granulocytes: 0 %
Lymphocytes Relative: 20 %
Lymphs Abs: 1.1 10*3/uL (ref 0.7–4.0)
MCH: 32.1 pg (ref 26.0–34.0)
MCHC: 33.2 g/dL (ref 30.0–36.0)
MCV: 96.6 fL (ref 80.0–100.0)
Monocytes Absolute: 0.6 10*3/uL (ref 0.1–1.0)
Monocytes Relative: 11 %
Neutro Abs: 3.7 10*3/uL (ref 1.7–7.7)
Neutrophils Relative %: 69 %
Platelets: 378 10*3/uL (ref 150–400)
RBC: 4.43 MIL/uL (ref 3.87–5.11)
RDW: 12.9 % (ref 11.5–15.5)
WBC: 5.4 10*3/uL (ref 4.0–10.5)
nRBC: 0 % (ref 0.0–0.2)

## 2020-06-22 LAB — COMPREHENSIVE METABOLIC PANEL
ALT: 16 U/L (ref 0–44)
AST: 21 U/L (ref 15–41)
Albumin: 2.7 g/dL — ABNORMAL LOW (ref 3.5–5.0)
Alkaline Phosphatase: 46 U/L (ref 38–126)
Anion gap: 12 (ref 5–15)
BUN: 27 mg/dL — ABNORMAL HIGH (ref 6–20)
CO2: 21 mmol/L — ABNORMAL LOW (ref 22–32)
Calcium: 9 mg/dL (ref 8.9–10.3)
Chloride: 105 mmol/L (ref 98–111)
Creatinine, Ser: 0.73 mg/dL (ref 0.44–1.00)
GFR calc Af Amer: >60 mL/min (ref >60)
GFR calc non Af Amer: >60 mL/min (ref >60)
Glucose, Bld: 180 mg/dL — ABNORMAL HIGH (ref 70–99)
Potassium: 4.1 mmol/L (ref 3.5–5.1)
Sodium: 138 mmol/L (ref 135–145)
Total Bilirubin: 0.6 mg/dL (ref 0.3–1.2)
Total Protein: 6.2 g/dL — ABNORMAL LOW (ref 6.5–8.1)

## 2020-06-22 LAB — D-DIMER, QUANTITATIVE: D-Dimer, Quant: 1.36 ug/mL-FEU — ABNORMAL HIGH (ref 0.00–0.50)

## 2020-06-22 LAB — FERRITIN: Ferritin: 287 ng/mL (ref 11–307)

## 2020-06-22 LAB — C-REACTIVE PROTEIN: CRP: 5.6 mg/dL — ABNORMAL HIGH (ref <1.0)

## 2020-06-22 LAB — MAGNESIUM: Magnesium: 2.5 mg/dL — ABNORMAL HIGH (ref 1.7–2.4)

## 2020-06-22 LAB — GLUCOSE, CAPILLARY
Glucose-Capillary: 205 mg/dL — ABNORMAL HIGH (ref 70–99)
Glucose-Capillary: 292 mg/dL — ABNORMAL HIGH (ref 70–99)
Glucose-Capillary: 258 mg/dL — ABNORMAL HIGH (ref 70–99)

## 2020-06-22 LAB — PHOSPHORUS: Phosphorus: 2.9 mg/dL (ref 2.5–4.6)

## 2020-06-22 MED ORDER — ALBUTEROL SULFATE HFA 108 (90 BASE) MCG/ACT IN AERS: 2.0000 | INHALATION_SPRAY | Freq: Four times a day (QID) | RESPIRATORY_TRACT | 0 refills | Status: DC

## 2020-06-22 MED ORDER — PANTOPRAZOLE SODIUM 40 MG PO TBEC: 40.0000 mg | DELAYED_RELEASE_TABLET | Freq: Every day | ORAL | 1 refills | Status: DC

## 2020-06-22 MED ORDER — DEXAMETHASONE 6 MG PO TABS
6.0000 mg | ORAL_TABLET | Freq: Every day | ORAL | 0 refills | Status: DC
Start: 2020-06-22 — End: 2020-06-22

## 2020-06-22 MED ORDER — DEXAMETHASONE 6 MG PO TABS
6.0000 mg | ORAL_TABLET | Freq: Every day | ORAL | 0 refills | Status: DC
Start: 2020-06-22 — End: 2020-10-11

## 2020-06-22 MED ORDER — GUAIFENESIN ER 600 MG PO TB12: 600.0000 mg | ORAL_TABLET | Freq: Two times a day (BID) | ORAL | 0 refills | Status: AC

## 2020-06-22 MED ORDER — ZINC SULFATE 220 (50 ZN) MG PO CAPS: 220.0000 mg | ORAL_CAPSULE | Freq: Every day | ORAL | 1 refills | Status: DC

## 2020-06-22 MED ORDER — ASCORBIC ACID 500 MG PO TABS: 500.0000 mg | ORAL_TABLET | Freq: Every day | ORAL | 1 refills | Status: DC

## 2020-06-22 MED ORDER — AMLODIPINE BESYLATE 10 MG PO TABS: 10.0000 mg | ORAL_TABLET | Freq: Every day | ORAL | 3 refills | Status: DC

## 2020-06-22 MED ORDER — ONDANSETRON HCL 4 MG PO TABS: 4.0000 mg | ORAL_TABLET | Freq: Four times a day (QID) | ORAL | 0 refills | Status: DC | PRN

## 2020-06-22 NOTE — Discharge Instructions (Signed)
You are scheduled for outpatient Remdesivir infusion at 9:30am on Thursday 7/22 and Saturday 7/24 at [facility]. Please park at [address] [place], [place], as staff will be escorting you through the east entrance of the hospital.    There is a wave flag banner located near the entrance on [address]. Turn into this entrance and immediately turn left and park in 1 of the 5 designated Covid Infusion Parking spots. There is a phone number on the sign, please call and let the staff know what spot you are in and we will come out and get you. For questions call [phone].  Thanks.   -1)Patient scheduled for outpatient Remdesivir infusion at 9:30am on Thursday 06/24/20 and Saturday 06/26/20 at [facility]. Please inform the patient to park at[address], [place], as staff will be escorting the patient through the east entrance of the hospital.  There is a wave flag banner located near the entrance on [address]. Turn into this entranceand immediatelyturn left and park in 1 of the 5 designated Covid Infusion Parking spots. There is a phone number on the sign, please call and let the staff know what spot you are in and we will come out and get you. For questions call [phone].   2) You are strongly advised to  to isolate/quarantine for at least 21 days from the date of your diagnoses with COVID-19 infection--please always wear a mask if you have to go outside the house  3) smoking cessation strongly advised

## 2020-06-22 NOTE — Progress Notes (Signed)
Patient scheduled for outpatient Remdesivir infusion at 9:30am on Thursday 7/22 and Saturday 7/24 at [facility]. Please inform the patient to park at [address] [place], [place], as staff will be escorting the patient through the east entrance of the hospital.    There is a wave flag banner located near the entrance on [address]. Turn into this entrance and immediately turn left and park in 1 of the 5 designated Covid Infusion Parking spots. There is a phone number on the sign, please call and let the staff know what spot you are in and we will come out and get you. For questions call [phone].  Thanks.

## 2020-06-22 NOTE — Progress Notes (Signed)
Inpatient Diabetes Program Recommendations  AACE/ADA: New Consensus Statement on Inpatient Glycemic Control   Target Ranges:  Prepandial:   less than 140 mg/dL      Peak postprandial:   less than 180 mg/dL (1-2 hours)      Critically ill patients:  140 - 180 mg/dL   Results for NAYANNA, SEABORN (MRN [id number]) as of 06/22/2020 10:41  Ref. Range 06/21/2020 09:27 06/21/2020 11:55 06/21/2020 17:15 06/21/2020 21:51 06/22/2020 08:42  Glucose-Capillary Latest Ref Range: 70 - 99 mg/dL 004 (H)  Novolog 11 units 269 (H)  Novolog 11 units 320 (H)  Novolog 11 units 227 (H)  Novolog 2 units 205 (H)  Novolog 5 units    Review of Glycemic Control  Diabetes history: DM2 Outpatient Diabetes medications: Lantus 25-35 units QHS, Glipizide 10 mg BID, Januvia 100 mg daily Current orders for Inpatient glycemic control: Novolog 0-15 units TID with meals, Novolog 0-5 units QHS; Decadron 6 mg Q24H  Inpatient Diabetes Program Recommendations:    Insulin-Basal: If steroids are continued as ordered, please consider ordering Lantus 13 units Q24H (based on 65.8 kg x 0.2 units).  Thanks, Orlando Penner, RN, MSN, CDE Diabetes Coordinator Inpatient Diabetes Program [phone] (Team Pager from 8am to 5pm)

## 2020-06-22 NOTE — Discharge Summary (Signed)
Tammy Bauer, is a 54 y.o. female  DOB [birth year]  MRN [id number].  Admission date:  06/20/2020  Admitting Physician  Frankey Shown, DO  Discharge Date:  06/22/2020   Primary MD  Grayce Sessions, NP  Recommendations for primary care physician for things to follow:   1)Patient scheduled for outpatient Remdesivir infusion at 9:30am on Thursday 06/24/20 and Saturday 06/26/20 at [facility]. Please inform the patient to park at[address], [place], as staff will be escorting the patient through the east entrance of the hospital.  There is a wave flag banner located near the entrance on [address]. Turn into this entranceand immediatelyturn left and park in 1 of the 5 designated Covid Infusion Parking spots. There is a phone number on the sign, please call and let the staff know what spot you are in and we will come out and get you. For questions call [phone].   2) You are strongly advised to  to isolate/quarantine for at least 21 days from the date of your diagnoses with COVID-19 infection--please always wear a mask if you have to go outside the house  3) smoking cessation strongly advised  Admission Diagnosis  Acute hypoxemic respiratory failure (HCC) [J96.01] Pneumonia due to COVID-19 virus [U07.1, J12.82] COVID-19 [U07.1]   Discharge Diagnosis  Acute hypoxemic respiratory failure (HCC) [J96.01] Pneumonia due to COVID-19 virus [U07.1, J12.82] COVID-19 [U07.1]    Principal Problem:   Pneumonia due to COVID-19 virus Active Problems:   Essential hypertension   Hyperglycemia   DM2 (diabetes mellitus, type 2) (HCC)   Hyperlipidemia   Dehydration   Hyponatremia   Acute respiratory failure with hypoxia (HCC)      Past Medical History:  Diagnosis Date  . Anemia   . Diabetes mellitus without complication (HCC)   . Hypertension   . Stroke [facility])    x2; no deficits  .  Thyroid nodule 10/02/2017    Past Surgical History:  Procedure Laterality Date  . ENDOMETRIAL ABLATION N/A 01/30/2018   Procedure: ENDOMETRIAL ABLATION WITH MINERVA;  Surgeon: Lazaro Arms, MD;  Location: [facility];  Service: Gynecology;  Laterality: N/A;  . HYSTEROSCOPY WITH D & C N/A 01/30/2018   Procedure: DILATATION AND CURETTAGE /HYSTEROSCOPY;  Surgeon: Lazaro Arms, MD;  Location: [facility];  Service: Gynecology;  Laterality: N/A;  . LOOP RECORDER INSERTION N/A 06/27/2017   Procedure: Loop Recorder Insertion;  Surgeon: Regan Lemming, MD;  Location: [facility];  Service: Cardiovascular;  Laterality: N/A;  . NERVE, TENDON AND ARTERY REPAIR Left 10/21/2019   Procedure: WOUND EXPLORATION LEFT HAND WITH REPAIR TENDON, ARTERY AND NERVE ;  Surgeon: Betha Loa, MD;  Location: [facility];  Service: Orthopedics;  Laterality: Left;  . TEE WITHOUT CARDIOVERSION N/A 06/27/2017   Procedure: TRANSESOPHAGEAL ECHOCARDIOGRAM (TEE);  Surgeon: [name], MD;  Location: [facility];  Service: Cardiovascular;  Laterality: N/A;     HPI  from the history and physical done on the day of admission:  Chief Complaint: Cough  HPI: Tammy Bauer is a 54 y.o. female with medical history significant for hypertension, hyperlipidemia, T2DM, CVA (10/02/2017), L MCA bifurcation aneurysm, thyroid nodule (right), hx of vaginal bleeding andanemia who presents to the emergency department due to 9-day onset of cough with production of thick cloudy white phlegm, subjective fever and chills which was controlled with Tylenol and NyQuil, she also complained of body aches in the lower back as well as decreased appetite.  Patient noted that her urine was darker and she thought it was probably due to dehydration.  Patient states that she has been exposed to family members who tested positive for COVID-19, she has not received any Covid vaccine and she denies chest pain, headache, nausea,  vomiting, diarrhea, abdominal pain, burning sensation on urination, urinary frequency or urgency.  ED Course:  In the emergency department, she was noted to be febrile with a temperature of 101.69F, she was tachycardic and tachypneic.  Work-up in the ED showed H&H 16.5/49.5, mild hyponatremia, hyperglycemia, hypoalbuminemia, SARS coronavirus 2 was positive.  Urinalysis was positive for glycosuria and proteinuria.  Chest x-ray showed bibasilar pneumonia, left greater than right.  Tylenol and albuterol were given.  Patient was provided with supplemental oxygen due to O2 sats dropping to high 80s at bedside.  Hospitalist was asked to admit patient for further evaluation and management.   Hospital Course:   Brief Summary:- 54 y.o.femalewith medical history significant forhypertension, hyperlipidemia, T2DM, CVA (10/02/2017), L MCA bifurcation aneurysm, thyroid nodule (right), hx of vaginal bleedingandanemia admitted on 06/20/2018 with Covid pneumonia and acute hypoxic respiratory failure secondary to same -Patient is unvaccinated against COVID-19 virus  A/p 1)Acute hypoxic respiratory failure secondary to COVID-19 infection/pneumonia--- The treatment plan and use of medications for treatment of COVID-19 infectionand possible side effects were discussed with patient     --Patient verbalizes understanding and agrees to treatment protocols  --Patient is positive for COVID-19 infection, chest x-ray with findings of infiltrates/opacities,  patient is hypoxic and requiring continuous supplemental oxygen---patient meets criteria for initiation of Remdesivir AND Decadron/Steroid therapy per protocol  --Inflammatory markers noted -CRP is down to 5.6 from 12.0 -CBC is WNL --Supplemental oxygen to keep O2 sats above 93% -Follow serial chest x-rays and ABGs as indicated --- Encourage prone positioning  --Attempt to maintain euvolemic state --Zinc and vitamin C as ordered -Today's remdesivir day  3 -Outpatient remdesivir infusion for additional 2 doses has been set up for patient -Albuterol inhaler as needed Hypoxia has resolved patient's O2 sats is 95 to 96% on room air post ambulation in the room  2)DM2-anticipate worsening hyperglycemia due to steroids, patient will adjust her insulin regimen at home   3)HTN--stable, continue amlodipine  Disposition--- discharge home with instructions as outlined in discharge instructions Discharge Condition: Stable without hypoxia  Follow UP--outpatient remdesivir infusion clinic   Consults obtained - na  Diet and Activity recommendation:  As advised  Discharge Instructions    Discharge Instructions    Call MD for:  difficulty breathing, headache or visual disturbances   Complete by: As directed    Call MD for:  extreme fatigue   Complete by: As directed    Call MD for:  persistant dizziness or light-headedness   Complete by: As directed    Call MD for:  persistant nausea and vomiting   Complete by: As directed    Call MD for:  temperature >100.4   Complete by: As directed    Diet - low sodium heart healthy  Complete by: As directed    Diet Carb Modified   Complete by: As directed    Discharge instructions   Complete by: As directed    1)Patient scheduled for outpatient Remdesivir infusion at 9:30am on Thursday 06/24/20 and Saturday 06/26/20 at [facility]. Please inform the patient to park at[address], [place], as staff will be escorting the patient through the east entrance of the hospital.  There is a wave flag banner located near the entrance on [address]. Turn into this entranceand immediatelyturn left and park in 1 of the 5 designated Covid Infusion Parking spots. There is a phone number on the sign, please call and let the staff know what spot you are in and we will come out and get you. For questions call [phone].   2) You are strongly advised to  to isolate/quarantine for at least 21 days  from the date of your diagnoses with COVID-19 infection--please always wear a mask if you have to go outside the house  3) smoking cessation strongly advised   Increase activity slowly   Complete by: As directed         Discharge Medications     Allergies as of 06/22/2020      Reactions   Ace Inhibitors Cough      Medication List    STOP taking these medications   hydrALAZINE 50 MG tablet Commonly known as: APRESOLINE     TAKE these medications   acetaminophen 325 MG tablet Commonly known as: TYLENOL Take 650 mg by mouth every 6 (six) hours as needed for mild pain or moderate pain.   albuterol 108 (90 Base) MCG/ACT inhaler Commonly known as: VENTOLIN HFA Inhale 2 puffs into the lungs every 6 (six) hours.   amLODipine 10 MG tablet Commonly known as: NORVASC Take 1 tablet (10 mg total) by mouth daily. TAKE ONE TABLET BY MOUTH ONCE DAILY   ascorbic acid 500 MG tablet Commonly known as: VITAMIN C Take 1 tablet (500 mg total) by mouth daily. Start taking on: June 23, 2020   atorvastatin 40 MG tablet Commonly known as: LIPITOR Take 1 tablet (40 mg total) by mouth at bedtime. Any generic statin from [employer] list is okay   clopidogrel 75 MG tablet Commonly known as: PLAVIX Take 1 tablet (75 mg total) by mouth daily.   dexamethasone 6 MG tablet Commonly known as: DECADRON Take 1 tablet (6 mg total) by mouth daily. With Food   glipiZIDE 10 MG tablet Commonly known as: GLUCOTROL Take 1 tablet (10 mg total) by mouth 2 (two) times daily before a meal.   guaiFENesin 600 MG 12 hr tablet Commonly known as: Mucinex Take 1 tablet (600 mg total) by mouth 2 (two) times daily for 10 days.   insulin glargine 100 UNIT/ML injection Commonly known as: LANTUS Inject 0.4 mLs (40 Units total) into the skin daily.   INSULIN SYRINGE 1CC/29G 29G X 1/2" 1 ML Misc Commonly known as: B-D INSULIN SYRINGE 1 application by Does not apply route at bedtime.   losartan 100 MG  tablet Commonly known as: COZAAR Take 1 tablet (100 mg total) by mouth daily.   ondansetron 4 MG tablet Commonly known as: ZOFRAN Take 1 tablet (4 mg total) by mouth every 6 (six) hours as needed for nausea or vomiting.   pantoprazole 40 MG tablet Commonly known as: PROTONIX Take 1 tablet (40 mg total) by mouth daily. Start taking on: June 23, 2020   sitaGLIPtin 100 MG  tablet Commonly known as: Januvia Take 1 tablet (100 mg total) by mouth daily.   zinc sulfate 220 (50 Zn) MG capsule Take 1 capsule (220 mg total) by mouth daily. Start taking on: June 23, 2020       Major procedures and Radiology Reports - PLEASE review detailed and final reports for all details, in brief -    DG Chest 2 View  Result Date: 06/20/2020 CLINICAL DATA:  Cough, fever. EXAM: CHEST - 2 VIEW COMPARISON:  May 21, 2019. FINDINGS: The heart size and mediastinal contours are within normal limits. No pneumothorax or pleural effusion is noted. Bibasilar ill-defined opacities are noted concerning for multifocal pneumonia, left greater than right. The visualized skeletal structures are unremarkable. IMPRESSION: Bibasilar pneumonia, left greater than right. Electronically Signed   By: Lupita Raider M.D.   On: 06/20/2020 14:23   CUP PACEART REMOTE DEVICE CHECK  Result Date: 06/14/2020 Carelink summary report received. Battery status OK. Normal device function. No new symptom episodes, tachy episodes, brady, or pause episodes. No new AF episodes. Monthly summary reports and ROV/PRN   Micro Results    Recent Results (from the past 240 hour(s))  SARS Coronavirus 2 by RT PCR (hospital order, performed in [facility] hospital lab) Nasopharyngeal Nasopharyngeal Swab     Status: Abnormal   Collection Time: 06/20/20  3:08 PM   Specimen: Nasopharyngeal Swab  Result Value Ref Range Status   SARS Coronavirus 2 POSITIVE (A) NEGATIVE Final    Comment: CRITICAL RESULT CALLED TO, READ BACK BY AND VERIFIED WITH: A  TUTTLE AT 1724 ON 06/20/2020 BY MOSLEY,J  (NOTE) SARS-CoV-2 target nucleic acids are DETECTED  SARS-CoV-2 RNA is generally detectable in upper respiratory specimens  during the acute phase of infection.  Positive results are indicative  of the presence of the identified virus, but do not rule out bacterial infection or co-infection with other pathogens not detected by the test.  Clinical correlation with patient history and  other diagnostic information is necessary to determine patient infection status.  The expected result is negative.  Fact Sheet for Patients:   BoilerBrush.com.cy   Fact Sheet for Healthcare Providers:   https://pope.com/    This test is not yet approved or cleared by the [place] FDA and  has been authorized for detection and/or diagnosis of SARS-CoV-2 by FDA under an Emergency Use Authorization (EUA).  This EUA will remain in effect  (meaning this test can be used) for the duration of  the COVID-19 declaration under Section 564(b)(1) of the Act, 21 U.S.C. section 360-bbb-3(b)(1), unless the authorization is terminated or revoked sooner.  Performed at [facility], [address], [place], [place] [address]   Blood Culture (routine x 2)     Status: None (Preliminary result)   Collection Time: 06/20/20  8:01 PM   Specimen: Right Antecubital; Blood  Result Value Ref Range Status   Specimen Description RIGHT ANTECUBITAL  Final   Special Requests   Final    BOTTLES DRAWN AEROBIC AND ANAEROBIC Blood Culture adequate volume   Culture   Final    NO GROWTH 2 DAYS Performed at [facility], [address], [place], [place] [address]    Report Status PENDING  Incomplete  Blood Culture (routine x 2)     Status: None (Preliminary result)   Collection Time: 06/20/20  8:15 PM   Specimen: BLOOD LEFT HAND  Result Value Ref Range Status   Specimen Description BLOOD LEFT HAND  Final   Special  Requests   Final    BOTTLES  DRAWN AEROBIC AND ANAEROBIC Blood Culture adequate volume   Culture   Final    NO GROWTH 2 DAYS Performed at [facility], [address] [place], [place] [id number]    Report Status PENDING  Incomplete       Today   Subjective    Zack SealMela Clabo today has no new complaints No fever  Or chills   No Nausea, Vomiting or Diarrhea        Patient has been seen and examined prior to discharge   Objective   Blood pressure (!) 141/78, pulse 91, temperature 98.2 F (36.8 C), temperature source Oral, resp. rate 16, height 5\' 2"  (1.575 m), weight 65.8 kg, SpO2 94 %.  No intake or output data in the 24 hours ending 06/22/20 1754  Exam Gen:- Awake Alert, no acute distress  HEENT:- [place].AT, No sclera icterus Neck-Supple Neck,No JVD,.  Lungs-improving air movement, no wheezing  CV- S1, S2 normal, regular Abd-  +ve B.Sounds, Abd Soft, No tenderness,    Extremity/Skin:- No  edema,   good pulses Psych-affect is appropriate, oriented x3 Neuro-generalized weakness, no new focal deficits, no tremors    Data Review   CBC w Diff:  Lab Results  Component Value Date   WBC 5.4 06/22/2020   HGB 14.2 06/22/2020   HGB 15.7 10/02/2019   HCT 42.8 06/22/2020   HCT 45.8 10/02/2019   PLT 378 06/22/2020   PLT 373 10/02/2019   LYMPHOPCT 20 06/22/2020   MONOPCT 11 06/22/2020   EOSPCT 0 06/22/2020   BASOPCT 0 06/22/2020   CMP:  Lab Results  Component Value Date   NA 138 06/22/2020   NA 138 10/02/2019   K 4.1 06/22/2020   CL 105 06/22/2020   CO2 21 (L) 06/22/2020   BUN 27 (H) 06/22/2020   BUN 13 10/02/2019   CREATININE 0.73 06/22/2020   PROT 6.2 (L) 06/22/2020   PROT 7.2 10/02/2019   ALBUMIN 2.7 (L) 06/22/2020   ALBUMIN 4.4 10/02/2019   BILITOT 0.6 06/22/2020   BILITOT 0.5 10/02/2019   ALKPHOS 46 06/22/2020   AST 21 06/22/2020   ALT 16 06/22/2020  .   Total Discharge time is about 33 minutes  Shon Haleourage Jehan Ranganathan M.D on 06/22/2020 at 5:54 PM  Go to [email or web address] -  for contact  info  [facility] - Office  [phone]

## 2020-06-22 NOTE — Progress Notes (Signed)
IV and telemetry boxed removed. AVS went over with patient, she has no questions or concerns. Patient discharged off unit via wheelchair, escorted by this RN.

## 2020-06-23 ENCOUNTER — Telehealth: Payer: Self-pay

## 2020-06-23 MED FILL — PANTOPRAZOLE SOD DR 40 MG T: 40 | 30 days supply | Qty: 30 | Fill #0

## 2020-06-23 MED FILL — DEXAMETHASONE 6 MG TABLET: 6 | 5 days supply | Qty: 5 | Fill #0

## 2020-06-23 MED FILL — !VENTOLIN HFA INHALER: 108 (90 BAS | 25 days supply | Qty: 18 | Fill #0

## 2020-06-23 MED FILL — AMLODIPINE BESYLATE 10 MG T: 10 | 30 days supply | Qty: 30 | Fill #0

## 2020-06-23 MED FILL — ONDANSETRON HCL 4 MG TABLET: 4 | 5 days supply | Qty: 20 | Fill #0

## 2020-06-23 NOTE — Telephone Encounter (Signed)
Transition Care Management Follow-up Telephone Call  Date of discharge and from where: 06/22/2020, [facility]   How have you been since you were released from the hospital? She said she is feeling pretty good, better, but still short of breath  Any questions or concerns? none at this time   Items Reviewed:  Did the pt receive and understand the discharge instructions provided?yes  Medications obtained and verified?  she does not have her new medications yet. She said that her husband will need to pick them up this afternoon.  She has the other medications and does not have any questions about her med regime.   Any new allergies since your discharge? none reported   Do you have support at home?  yes.    She said she is remaining quarantined.   Has glucometer.   Functional Questionnaire: (I = Independent and D = Dependent) ADLs: independent  Follow up appointments reviewed:   PCP Hospital f/u appt confirmed? She said she will call to schedule an appt.   Specialist Hospital f/u appt confirmed?remdesivir infusions at [facility] 06/24/2020 and 06/26/2020. She said she has the information where to go for these appts.   Are transportation arrangements needed?  no  If their condition worsens, is the pt aware to call PCP or go to the Emergency Dept.?  yes  Was the patient provided with contact information for the PCP's office or ED?  she has the clinic phone number  Was to pt encouraged to call back with questions or concerns?  yes

## 2020-06-24 ENCOUNTER — Ambulatory Visit (HOSPITAL_COMMUNITY)
Admit: 2020-06-24 | Discharge: 2020-06-24 | Disposition: A | Discharge status: Home / Self Care | Payer: HRSA Program | Attending: Pulmonary Disease | Admitting: Pulmonary Disease

## 2020-06-24 DIAGNOSIS — U071 COVID-19: Secondary | ICD-10-CM | POA: Insufficient documentation

## 2020-06-24 DIAGNOSIS — J1289 Other viral pneumonia: Secondary | ICD-10-CM | POA: Insufficient documentation

## 2020-06-24 MED ORDER — SODIUM CHLORIDE 0.9 % IV SOLN
100.0000 mg | Freq: Once | INTRAVENOUS | Status: AC
  Administered 2020-06-24: 100 mg via INTRAVENOUS
  Filled 2020-06-24: qty 20

## 2020-06-24 MED ORDER — FAMOTIDINE IN NACL 20-0.9 MG/50ML-% IV SOLN: 20.0000 mg | Freq: Once | INTRAVENOUS | Status: DC | PRN

## 2020-06-24 MED ORDER — ALBUTEROL SULFATE HFA 108 (90 BASE) MCG/ACT IN AERS: 2.0000 | INHALATION_SPRAY | Freq: Once | RESPIRATORY_TRACT | Status: DC | PRN

## 2020-06-24 MED ORDER — METHYLPREDNISOLONE SODIUM SUCC 125 MG IJ SOLR: 125.0000 mg | Freq: Once | INTRAMUSCULAR | Status: DC | PRN

## 2020-06-24 MED ORDER — SODIUM CHLORIDE 0.9 % IV SOLN: INTRAVENOUS | Status: DC | PRN

## 2020-06-24 MED ORDER — DIPHENHYDRAMINE HCL 50 MG/ML IJ SOLN: 50.0000 mg | Freq: Once | INTRAMUSCULAR | Status: DC | PRN

## 2020-06-24 MED ORDER — EPINEPHRINE 0.3 MG/0.3ML IJ SOAJ: 0.3000 mg | Freq: Once | INTRAMUSCULAR | Status: DC | PRN

## 2020-06-24 NOTE — Progress Notes (Signed)
  Diagnosis: COVID-19  Physician: Dr. Shan Levans  Procedure: Covid Infusion Clinic Med: remdesivir infusion - Provided patient with remdesivir fact sheet for patients, parents and caregivers prior to infusion.  Complications: No immediate complications noted.  Discharge: Discharged home   Essie Hart 06/24/2020

## 2020-06-24 NOTE — Discharge Instructions (Signed)
10 Things You Can Do to Manage Your COVID-19 Symptoms at Home If you have possible or confirmed COVID-19: 1. Stay home from work and school. And stay away from other public places. If you must go out, avoid using any kind of public transportation, ridesharing, or taxis. 2. Monitor your symptoms carefully. If your symptoms get worse, call your healthcare provider immediately. 3. Get rest and stay hydrated. 4. If you have a medical appointment, call the healthcare provider ahead of time and tell them that you have or may have COVID-19. 5. For medical emergencies, call 911 and notify the dispatch personnel that you have or may have COVID-19. 6. Cover your cough and sneezes with a tissue or use the inside of your elbow. 7. Wash your hands often with soap and water for at least 20 seconds or clean your hands with an alcohol-based hand sanitizer that contains at least 60% alcohol. 8. As much as possible, stay in a specific room and away from other people in your home. Also, you should use a separate bathroom, if available. If you need to be around other people in or outside of the home, wear a mask. 9. Avoid sharing personal items with other people in your household, like dishes, towels, and bedding. 10. Clean all surfaces that are touched often, like counters, tabletops, and doorknobs. Use household cleaning sprays or wipes according to the label instructions. cdc.gov/coronavirus 06/04/2019 This information is not intended to replace advice given to you by your health care provider. Make sure you discuss any questions you have with your health care provider. Document Revised: 11/06/2019 Document Reviewed: 11/06/2019 Elsevier Patient Education  2020 Elsevier Inc.  

## 2020-06-25 LAB — CULTURE, BLOOD (ROUTINE X 2)
Culture: NO GROWTH
Culture: NO GROWTH
Special Requests: ADEQUATE
Special Requests: ADEQUATE

## 2020-06-26 ENCOUNTER — Ambulatory Visit (HOSPITAL_COMMUNITY)
Admit: 2020-06-26 | Discharge: 2020-06-26 | Disposition: A | Discharge status: Home / Self Care | Payer: HRSA Program | Attending: Pulmonary Disease | Admitting: Pulmonary Disease

## 2020-06-26 DIAGNOSIS — U071 COVID-19: Secondary | ICD-10-CM | POA: Diagnosis present

## 2020-06-26 DIAGNOSIS — J1282 Pneumonia due to coronavirus disease 2019: Secondary | ICD-10-CM | POA: Insufficient documentation

## 2020-06-26 MED ORDER — SODIUM CHLORIDE 0.9 % IV SOLN
100.0000 mg | Freq: Once | INTRAVENOUS | Status: AC
  Administered 2020-06-26: 100 mg via INTRAVENOUS
  Filled 2020-06-26: qty 20

## 2020-06-26 MED ORDER — ALBUTEROL SULFATE HFA 108 (90 BASE) MCG/ACT IN AERS: 2.0000 | INHALATION_SPRAY | Freq: Once | RESPIRATORY_TRACT | Status: DC | PRN

## 2020-06-26 MED ORDER — METHYLPREDNISOLONE SODIUM SUCC 125 MG IJ SOLR: 125.0000 mg | Freq: Once | INTRAMUSCULAR | Status: DC | PRN

## 2020-06-26 MED ORDER — DIPHENHYDRAMINE HCL 50 MG/ML IJ SOLN: 50.0000 mg | Freq: Once | INTRAMUSCULAR | Status: DC | PRN

## 2020-06-26 MED ORDER — EPINEPHRINE 0.3 MG/0.3ML IJ SOAJ: 0.3000 mg | Freq: Once | INTRAMUSCULAR | Status: DC | PRN

## 2020-06-26 MED ORDER — SODIUM CHLORIDE 0.9 % IV SOLN: INTRAVENOUS | Status: DC | PRN

## 2020-06-26 MED ORDER — FAMOTIDINE IN NACL 20-0.9 MG/50ML-% IV SOLN: 20.0000 mg | Freq: Once | INTRAVENOUS | Status: DC | PRN

## 2020-06-26 NOTE — Progress Notes (Signed)
.   Diagnosis: COVID-19  Physician: Dr. Shan Levans  Procedure: Covid Infusion Clinic Med: remdesivir infusion - Provided patient with remdesivir fact sheet for patients, parents and caregivers prior to infusion.  Complications: No immediate complications noted.  Discharge: Discharged home   Charlotte Crumb 06/26/2020

## 2020-07-05 MED FILL — CLOPIDOGREL 75 MG TABLET: 75 | 30 days supply | Qty: 30 | Fill #1

## 2020-07-05 MED FILL — ATORVASTATIN CALCIUM 40 MG: 40 | 30 days supply | Qty: 30 | Fill #1

## 2020-07-05 MED FILL — JANUVIA 100 MG TABLET: 100 | 30 days supply | Qty: 30 | Fill #1

## 2020-07-05 MED FILL — glipiZIDE 10 MG TABS: 10 | 30 days supply | Qty: 60 | Fill #1

## 2020-07-05 MED FILL — LOSARTAN POTASSIUM 100 MG T: 100 | 30 days supply | Qty: 30 | Fill #1

## 2020-07-05 MED FILL — $LANTUS 100 UNITS/ML VIAL: 100 | 25 days supply | Qty: 10 | Fill #3

## 2020-07-05 MED FILL — AMLODIPINE BESYLATE 10 MG T: 10 | 30 days supply | Qty: 30 | Fill #1

## 2020-07-19 ENCOUNTER — Ambulatory Visit (INDEPENDENT_AMBULATORY_CARE_PROVIDER_SITE_OTHER): Payer: Self-pay | Admitting: *Deleted

## 2020-07-19 DIAGNOSIS — I639 Cerebral infarction, unspecified: Secondary | ICD-10-CM

## 2020-07-19 LAB — CUP PACEART REMOTE DEVICE CHECK
Date Time Interrogation Session: 20210813235625
Implantable Pulse Generator Implant Date: 20180725

## 2020-07-21 NOTE — Progress Notes (Signed)
[facility] Summary Report / Loop Recorder 

## 2020-08-11 MED FILL — glipiZIDE 10 MG TABS: 10 | 30 days supply | Qty: 60 | Fill #2

## 2020-08-11 MED FILL — JANUVIA 100 MG TABLET: 100 | 30 days supply | Qty: 30 | Fill #2

## 2020-08-11 MED FILL — AMLODIPINE BESYLATE 10 MG T: 10 | 30 days supply | Qty: 30 | Fill #2

## 2020-08-11 MED FILL — CLOPIDOGREL 75 MG TABLET: 75 | 30 days supply | Qty: 30 | Fill #2

## 2020-08-11 MED FILL — $LANTUS 100 UNITS/ML VIAL: 100 | 50 days supply | Qty: 20 | Fill #1

## 2020-08-11 MED FILL — ATORVASTATIN CALCIUM 40 MG: 40 | 30 days supply | Qty: 30 | Fill #2

## 2020-08-11 MED FILL — LOSARTAN POTASSIUM 100 MG T: 100 | 30 days supply | Qty: 30 | Fill #2

## 2020-08-23 ENCOUNTER — Ambulatory Visit (INDEPENDENT_AMBULATORY_CARE_PROVIDER_SITE_OTHER): Payer: Self-pay | Admitting: *Deleted

## 2020-08-23 DIAGNOSIS — I639 Cerebral infarction, unspecified: Secondary | ICD-10-CM

## 2020-08-23 LAB — CUP PACEART REMOTE DEVICE CHECK
Date Time Interrogation Session: 20210915235944
Implantable Pulse Generator Implant Date: 20180725

## 2020-08-24 NOTE — Progress Notes (Signed)
[facility] Summary Report / Loop Recorder 

## 2020-09-21 LAB — CUP PACEART REMOTE DEVICE CHECK
Date Time Interrogation Session: 20211019003437
Implantable Pulse Generator Implant Date: 20180725

## 2020-09-21 MED FILL — LOSARTAN POTASSIUM 100 MG T: 100 | 90 days supply | Qty: 90 | Fill #3

## 2020-09-21 MED FILL — AMLODIPINE BESYLATE 10 MG T: 10 | 90 days supply | Qty: 90 | Fill #3

## 2020-09-21 MED FILL — CLOPIDOGREL 75 MG TABLET: 75 | 90 days supply | Qty: 90 | Fill #3

## 2020-09-21 MED FILL — JANUVIA 100 MG TABLET: 100 | 30 days supply | Qty: 30 | Fill #3

## 2020-09-21 MED FILL — ATORVASTATIN CALCIUM 40 MG: 40 | 90 days supply | Qty: 90 | Fill #3

## 2020-09-21 MED FILL — glipiZIDE 10 MG TABS: 10 | 90 days supply | Qty: 180 | Fill #3

## 2020-09-27 ENCOUNTER — Ambulatory Visit (INDEPENDENT_AMBULATORY_CARE_PROVIDER_SITE_OTHER): Payer: Self-pay | Admitting: Primary Care

## 2020-09-27 ENCOUNTER — Ambulatory Visit (INDEPENDENT_AMBULATORY_CARE_PROVIDER_SITE_OTHER): Payer: Self-pay

## 2020-09-27 DIAGNOSIS — I639 Cerebral infarction, unspecified: Secondary | ICD-10-CM

## 2020-09-30 MED FILL — AMLODIPINE BESYLATE 10 MG T: 10 | 30 days supply | Qty: 30 | Fill #3

## 2020-09-30 MED FILL — LANTUS 100 UNITS/ML VIAL: 100 | 25 days supply | Qty: 10 | Fill #2

## 2020-09-30 MED FILL — glipiZIDE 10 MG TABS: 10 | 30 days supply | Qty: 60 | Fill #3

## 2020-09-30 MED FILL — ATORVASTATIN CALCIUM 40 MG: 40 | 30 days supply | Qty: 30 | Fill #3

## 2020-09-30 MED FILL — CLOPIDOGREL 75 MG TABLET: 75 | 30 days supply | Qty: 30 | Fill #3

## 2020-09-30 MED FILL — JANUVIA 100 MG TABLET: 100 | 30 days supply | Qty: 30 | Fill #3

## 2020-09-30 MED FILL — LOSARTAN POTASSIUM 100 MG T: 100 | 30 days supply | Qty: 30 | Fill #3

## 2020-09-30 NOTE — Progress Notes (Signed)
[facility] Summary Report / Loop Recorder 

## 2020-10-11 ENCOUNTER — Ambulatory Visit (INDEPENDENT_AMBULATORY_CARE_PROVIDER_SITE_OTHER): Payer: 59 | Admitting: Primary Care

## 2020-10-11 ENCOUNTER — Encounter (INDEPENDENT_AMBULATORY_CARE_PROVIDER_SITE_OTHER): Payer: Self-pay | Admitting: Primary Care

## 2020-10-11 ENCOUNTER — Other Ambulatory Visit: Payer: Self-pay

## 2020-10-11 VITALS — BP 165/101 | HR 91 | Temp 97.5°F | Ht 62.0 in | Wt 149.4 lb

## 2020-10-11 DIAGNOSIS — E1159 Type 2 diabetes mellitus with other circulatory complications: Secondary | ICD-10-CM

## 2020-10-11 DIAGNOSIS — Z760 Encounter for issue of repeat prescription: Secondary | ICD-10-CM

## 2020-10-11 DIAGNOSIS — Z1211 Encounter for screening for malignant neoplasm of colon: Secondary | ICD-10-CM | POA: Diagnosis not present

## 2020-10-11 DIAGNOSIS — Z23 Encounter for immunization: Secondary | ICD-10-CM

## 2020-10-11 DIAGNOSIS — E119 Type 2 diabetes mellitus without complications: Secondary | ICD-10-CM

## 2020-10-11 DIAGNOSIS — Z1231 Encounter for screening mammogram for malignant neoplasm of breast: Secondary | ICD-10-CM

## 2020-10-11 DIAGNOSIS — Z794 Long term (current) use of insulin: Secondary | ICD-10-CM

## 2020-10-11 DIAGNOSIS — E782 Mixed hyperlipidemia: Secondary | ICD-10-CM

## 2020-10-11 DIAGNOSIS — I10 Essential (primary) hypertension: Secondary | ICD-10-CM

## 2020-10-11 DIAGNOSIS — Z720 Tobacco use: Secondary | ICD-10-CM

## 2020-10-11 LAB — POCT GLYCOSYLATED HEMOGLOBIN (HGB A1C): Hemoglobin A1C: 13 % — AB (ref 4.0–5.6)

## 2020-10-11 LAB — GLUCOSE, POCT (MANUAL RESULT ENTRY): POC Glucose: 309 mg/dl — AB (ref 70–99)

## 2020-10-11 MED ORDER — OLMESARTAN MEDOXOMIL-HCTZ 40-25 MG PO TABS: 1.0000 | ORAL_TABLET | Freq: Every day | ORAL | 1 refills | Status: DC

## 2020-10-11 MED ORDER — CLOPIDOGREL BISULFATE 75 MG PO TABS
75.0000 mg | ORAL_TABLET | Freq: Every day | ORAL | 1 refills | Status: DC
Start: 2020-10-11 — End: 2021-11-28
  Filled 2021-05-31: qty 30, 30d supply, fill #0
  Filled 2021-08-01: qty 30, 30d supply, fill #1
  Filled 2021-09-29: qty 30, 30d supply, fill #2

## 2020-10-11 MED ORDER — INSULIN ASPART 100 UNIT/ML ~~LOC~~ SOLN: 12.0000 [IU] | Freq: Three times a day (TID) | SUBCUTANEOUS | 3 refills | Status: DC

## 2020-10-11 MED ORDER — SITAGLIPTIN PHOSPHATE 100 MG PO TABS: 100.0000 mg | ORAL_TABLET | Freq: Every day | ORAL | 1 refills | Status: DC

## 2020-10-11 MED ORDER — GLIPIZIDE 10 MG PO TABS: 10.0000 mg | ORAL_TABLET | Freq: Two times a day (BID) | ORAL | 3 refills | Status: DC

## 2020-10-11 MED ORDER — INSULIN GLARGINE 100 UNIT/ML ~~LOC~~ SOLN: 30.0000 [IU] | Freq: Two times a day (BID) | SUBCUTANEOUS | 3 refills | Status: DC

## 2020-10-11 MED FILL — OLMESARTAN-HCTZ 40-25 MG TA: 40-25 | 30 days supply | Qty: 30 | Fill #0

## 2020-10-11 NOTE — Patient Instructions (Addendum)
Medication changes increase insulin Lantus 30 units twice a day after breakfast and dinner, added sliding scale insulin dose as noted for coverage and continue glipizide 10 mg twice a day and Januvia 100 mg daily.  Blood pressure is continue losartan/Cozaar replace with Benicar 40/25 continue amlodipine 10 mg daily  Influenza, Adult Influenza is also called "the flu." It is an infection in the lungs, nose, and throat (respiratory tract). It is caused by a virus. The flu causes symptoms that are similar to symptoms of a cold. It also causes a high fever and body aches. The flu spreads easily from person to person (is contagious). Getting a flu shot (influenza vaccination) every year is the best way to prevent the flu. What are the causes? This condition is caused by the influenza virus. You can get the virus by:  Breathing in droplets that are in the air from the cough or sneeze of a person who has the virus.  Touching something that has the virus on it (is contaminated) and then touching your mouth, nose, or eyes. What increases the risk? Certain things may make you more likely to get the flu. These include:  Not washing your hands often.  Having close contact with many people during cold and flu season.  Touching your mouth, eyes, or nose without first washing your hands.  Not getting a flu shot every year. You may have a higher risk for the flu, along with serious problems such as a lung infection (pneumonia), if you:  Are older than 65.  Are pregnant.  Have a weakened disease-fighting system (immune system) because of a disease or taking certain medicines.  Have a long-term (chronic) illness, such as: ? Heart, kidney, or lung disease. ? Diabetes. ? Asthma.  Have a liver disorder.  Are very overweight (morbidly obese).  Have anemia. This is a condition that affects your red blood cells. What are the signs or symptoms? Symptoms usually begin suddenly and last 4-14 days. They  may include:  Fever and chills.  Headaches, body aches, or muscle aches.  Sore throat.  Cough.  Runny or stuffy (congested) nose.  Chest discomfort.  Not wanting to eat as much as normal (poor appetite).  Weakness or feeling tired (fatigue).  Dizziness.  Feeling sick to your stomach (nauseous) or throwing up (vomiting). How is this treated? If the flu is found early, you can be treated with medicine that can help reduce how bad the illness is and how long it lasts (antiviral medicine). This may be given by mouth (orally) or through an IV tube. Taking care of yourself at home can help your symptoms get better. Your doctor may suggest:  Taking over-the-counter medicines.  Drinking plenty of fluids. The flu often goes away on its own. If you have very bad symptoms or other problems, you may be treated in a hospital. Follow these instructions at home:     Activity  Rest as needed. Get plenty of sleep.  Stay home from work or school as told by your doctor. ? Do not leave home until you do not have a fever for 24 hours without taking medicine. ? Leave home only to visit your doctor. Eating and drinking  Take an ORS (oral rehydration solution). This is a drink that is sold at pharmacies and stores.  Drink enough fluid to keep your pee (urine) pale yellow.  Drink clear fluids in small amounts as you are able. Clear fluids include: ? Water. ? Ice chips. ? Fruit  juice that has water added (diluted fruit juice). ? Low-calorie sports drinks.  Eat bland, easy-to-digest foods in small amounts as you are able. These foods include: ? Bananas. ? Applesauce. ? Rice. ? Lean meats. ? Toast. ? Crackers.  Do not eat or drink: ? Fluids that have a lot of sugar or caffeine. ? Alcohol. ? Spicy or fatty foods. General instructions  Take over-the-counter and prescription medicines only as told by your doctor.  Use a cool mist humidifier to add moisture to the air in your  home. This can make it easier for you to breathe.  Cover your mouth and nose when you cough or sneeze.  Wash your hands with soap and water often, especially after you cough or sneeze. If you cannot use soap and water, use alcohol-based hand sanitizer.  Keep all follow-up visits as told by your doctor. This is important. How is this prevented?   Get a flu shot every year. You may get the flu shot in late summer, fall, or winter. Ask your doctor when you should get your flu shot.  Avoid contact with people who are sick during fall and winter (cold and flu season). Contact a doctor if:  You get new symptoms.  You have: ? Chest pain. ? Watery poop (diarrhea). ? A fever.  Your cough gets worse.  You start to have more mucus.  You feel sick to your stomach.  You throw up. Get help right away if you:  Have shortness of breath.  Have trouble breathing.  Have skin or nails that turn a bluish color.  Have very bad pain or stiffness in your neck.  Get a sudden headache.  Get sudden pain in your face or ear.  Cannot eat or drink without throwing up. Summary  Influenza ("the flu") is an infection in the lungs, nose, and throat. It is caused by a virus.  Take over-the-counter and prescription medicines only as told by your doctor.  Getting a flu shot every year is the best way to avoid getting the flu. This information is not intended to replace advice given to you by your health care provider. Make sure you discuss any questions you have with your health care provider. Document Revised: 05/08/2018 Document Reviewed: 05/08/2018 Elsevier Patient Education  2020 [employer].

## 2020-10-11 NOTE — Progress Notes (Signed)
Established Patient Office Visit  Subjective:  Patient ID: Tammy Bauer, female    DOB: [birth year]  Age: 54 y.o. MRN: [id number]  CC:  Chief Complaint  Patient presents with   Diabetes   Blood Pressure Check    HPI [name] is a 54 year old female presents for the management type 2 diabetes fasting this morning is 302 and she has taken her insulin.  She denies polydipsia and polyphagia. She admits to polyuria.  Blood pressure is also elevated will need to recheck she denies  shortness of breath, headaches, chest pain or lower extremity edema  Past Medical History:  Diagnosis Date   Anemia    Diabetes mellitus without complication ([place])    Hypertension    Stroke [facility])    x2; no deficits   Thyroid nodule 10/02/2017    Past Surgical History:  Procedure Laterality Date   ENDOMETRIAL ABLATION N/A 01/30/2018   Procedure: ENDOMETRIAL ABLATION WITH MINERVA;  Surgeon: Florian Buff, MD;  Location: [facility];  Service: Gynecology;  Laterality: N/A;   HYSTEROSCOPY WITH D & C N/A 01/30/2018   Procedure: DILATATION AND CURETTAGE /HYSTEROSCOPY;  Surgeon: Florian Buff, MD;  Location: [facility];  Service: Gynecology;  Laterality: N/A;   LOOP RECORDER INSERTION N/A 06/27/2017   Procedure: Loop Recorder Insertion;  Surgeon: Constance Haw, MD;  Location: [facility];  Service: Cardiovascular;  Laterality: N/A;   NERVE, TENDON AND ARTERY REPAIR Left 10/21/2019   Procedure: WOUND EXPLORATION LEFT HAND WITH REPAIR TENDON, ARTERY AND NERVE ;  Surgeon: Leanora Cover, MD;  Location: [facility];  Service: Orthopedics;  Laterality: Left;   TEE WITHOUT CARDIOVERSION N/A 06/27/2017   Procedure: TRANSESOPHAGEAL ECHOCARDIOGRAM (TEE);  Surgeon: Pixie Casino, MD;  Location: [facility];  Service: Cardiovascular;  Laterality: N/A;    Family History  Problem Relation Age of Onset   Stroke Maternal Grandfather     Social History   Socioeconomic  History   Marital status: Married    Spouse name: Not on file   Number of children: Not on file   Years of education: Not on file   Highest education level: Not on file  Occupational History   Not on file  Tobacco Use   Smoking status: Current Every Day Smoker    Packs/day: 0.25    Years: 20.00    Pack years: 5.00    Types: Cigarettes    Start date: 08/29/1999   Smokeless tobacco: Never Used   Tobacco comment: smoke 5 cigarettes a day  Vaping Use   Vaping Use: Never used  Substance and Sexual Activity   Alcohol use: No   Drug use: No   Sexual activity: Not Currently    Birth control/protection: None  Other Topics Concern   Not on file  Social History Narrative   Not on file   Social Determinants of Health   Financial Resource Strain:    Difficulty of Paying Living Expenses: Not on file  Food Insecurity:    Worried About Charity fundraiser in the Last Year: Not on file   [employer] of Food in the Last Year: Not on file  Transportation Needs:    Lack of Transportation (Medical): Not on file   Lack of Transportation (Non-Medical): Not on file  Physical Activity:    Days of Exercise per Week: Not on file   Minutes of Exercise per Session: Not on file  Stress:    Feeling  of Stress : Not on file  Social Connections:    Frequency of Communication with Friends and Family: Not on file   Frequency of Social Gatherings with Friends and Family: Not on file   Attends Religious Services: Not on file   Active Member of Clubs or Organizations: Not on file   Attends Archivist Meetings: Not on file   Marital Status: Not on file  Intimate Partner Violence:    Fear of Current or Ex-Partner: Not on file   Emotionally Abused: Not on file   Physically Abused: Not on file   Sexually Abused: Not on file    Outpatient Medications Prior to Visit  Medication Sig Dispense Refill   acetaminophen (TYLENOL) 325 MG tablet Take 650 mg by mouth every  6 (six) hours as needed for mild pain or moderate pain.      albuterol (VENTOLIN HFA) 108 (90 Base) MCG/ACT inhaler Inhale 2 puffs into the lungs every 6 (six) hours. 18 g 0   amLODipine (NORVASC) 10 MG tablet Take 1 tablet (10 mg total) by mouth daily. TAKE ONE TABLET BY MOUTH ONCE DAILY 90 tablet 3   ascorbic acid (VITAMIN C) 500 MG tablet Take 1 tablet (500 mg total) by mouth daily. 30 tablet 1   atorvastatin (LIPITOR) 40 MG tablet Take 1 tablet (40 mg total) by mouth at bedtime. Any generic statin from [employer] list is okay 90 tablet 1   INSULIN SYRINGE 1CC/29G (B-D INSULIN SYRINGE) 29G X 1/2" 1 ML MISC 1 application by Does not apply route at bedtime. 30 each 5   pantoprazole (PROTONIX) 40 MG tablet Take 1 tablet (40 mg total) by mouth daily. 30 tablet 1   zinc sulfate 220 (50 Zn) MG capsule Take 1 capsule (220 mg total) by mouth daily. 30 capsule 1   clopidogrel (PLAVIX) 75 MG tablet Take 1 tablet (75 mg total) by mouth daily. 90 tablet 1   glipiZIDE (GLUCOTROL) 10 MG tablet Take 1 tablet (10 mg total) by mouth 2 (two) times daily before a meal. 180 tablet 3   insulin glargine (LANTUS) 100 UNIT/ML injection Inject 0.4 mLs (40 Units total) into the skin daily. 10 mL 3   losartan (COZAAR) 100 MG tablet Take 1 tablet (100 mg total) by mouth daily. 90 tablet 3   sitaGLIPtin (JANUVIA) 100 MG tablet Take 1 tablet (100 mg total) by mouth daily. 90 tablet 1   dexamethasone (DECADRON) 6 MG tablet Take 1 tablet (6 mg total) by mouth daily. With Food 5 tablet 0   ondansetron (ZOFRAN) 4 MG tablet Take 1 tablet (4 mg total) by mouth every 6 (six) hours as needed for nausea or vomiting. 20 tablet 0   No facility-administered medications prior to visit.    Allergies  Allergen Reactions   Ace Inhibitors Cough    ROS Review of Systems  All other systems reviewed and are negative.     Objective:    Physical Exam Vitals reviewed.  Constitutional:      Appearance: Normal  appearance.  HENT:     Head: Normocephalic.     Nose: Nose normal.  Cardiovascular:     Rate and Rhythm: Normal rate and regular rhythm.  Pulmonary:     Effort: Pulmonary effort is normal.     Breath sounds: Normal breath sounds.  Abdominal:     General: Bowel sounds are normal.     Palpations: Abdomen is soft.  Musculoskeletal:        General:  Normal range of motion.     Cervical back: Normal range of motion.  Skin:    General: Skin is warm and dry.  Neurological:     Mental Status: She is alert and oriented to person, place, and time.  Psychiatric:        Mood and Affect: Mood normal.        Behavior: Behavior normal.        Thought Content: Thought content normal.        Judgment: Judgment normal.     BP (!) 165/101 (BP Location: Left Arm, Patient Position: Sitting, Cuff Size: Normal)    Pulse 91    Temp (!) 97.5 F (36.4 C) (Temporal)    Ht 5' 2" (1.575 m)    Wt 149 lb 6.4 oz (67.8 kg)    SpO2 96%    BMI 27.33 kg/m  Wt Readings from Last 3 Encounters:  10/11/20 149 lb 6.4 oz (67.8 kg)  06/20/20 145 lb (65.8 kg)  04/29/20 150 lb 12.8 oz (68.4 kg)     Health Maintenance Due  Topic Date Due   OPHTHALMOLOGY EXAM  Never done   COVID-19 Vaccine (1) Never done   MAMMOGRAM  Never done   Fecal DNA (Cologuard)  Never done   INFLUENZA VACCINE  07/04/2020    There are no preventive care reminders to display for this patient.  Lab Results  Component Value Date   TSH 2.530 05/21/2019   Lab Results  Component Value Date   WBC 5.4 06/22/2020   HGB 14.2 06/22/2020   HCT 42.8 06/22/2020   MCV 96.6 06/22/2020   PLT 378 06/22/2020   Lab Results  Component Value Date   NA 138 06/22/2020   K 4.1 06/22/2020   CO2 21 (L) 06/22/2020   GLUCOSE 180 (H) 06/22/2020   BUN 27 (H) 06/22/2020   CREATININE 0.73 06/22/2020   BILITOT 0.6 06/22/2020   ALKPHOS 46 06/22/2020   AST 21 06/22/2020   ALT 16 06/22/2020   PROT 6.2 (L) 06/22/2020   ALBUMIN 2.7 (L) 06/22/2020    CALCIUM 9.0 06/22/2020   ANIONGAP 12 06/22/2020   Lab Results  Component Value Date   CHOL 192 10/02/2019   Lab Results  Component Value Date   HDL 38 (L) 10/02/2019   Lab Results  Component Value Date   LDLCALC 135 (H) 10/02/2019   Lab Results  Component Value Date   TRIG 217 (H) 06/20/2020   Lab Results  Component Value Date   CHOLHDL 5.1 (H) 10/02/2019   Lab Results  Component Value Date   HGBA1C 13.0 (A) 10/11/2020      Assessment & Plan:   Tammy Bauer was seen today for diabetes and blood pressure check.  Diagnoses and all orders for this visit:  Colon cancer screening -     Ambulatory referral to Gastroenterology  Encounter for screening mammogram for malignant neoplasm of breast -     MM Digital Diagnostic Bilat; Future  Comprehensive diabetic foot examination, type 2 DM, encounter for [facility]) Completed   Type 2 diabetes mellitus with other circulatory complication, with long-term current use of insulin (HCC) Medication adjustments- Medication changes increase insulin Lantus 30 units twice a day after breakfast and dinner, added sliding scale insulin dose as noted for coverage and continue glipizide 10 mg twice a day and Januvia 100 mg daily -     Ambulatory referral to Ophthalmology -     CBC with Differential -  HgB A1c -     Glucose (CBG)  Mixed hyperlipidemia -     Lipid Panel  Essential hypertension Counseled on blood pressure goal of less than 130/80, low-sodium, DASH diet, medication compliance, 150 minutes of moderate intensity exercise per week. Discussed medication compliance, adverse effects. losartan/Cozaar replace with Benicar 40/25 continue amlodipine 10 mg daily -     CMP14+EGFR  Tobacco abuse She is aware of increased risk for lung cancer and other respiratory diseases recommend cessation. We also discussed causing Bp to be elevated. Due to stressor in her life she is thinking about stopping . This will be reminded at each clinical  visit.  Medication refill Hx of CVA -     clopidogrel (PLAVIX) 75 MG tablet; Take 1 tablet (75 mg total) by mouth daily.  Other orders -     glipiZIDE (GLUCOTROL) 10 MG tablet; Take 1 tablet (10 mg total) by mouth 2 (two) times daily before a meal. -     insulin glargine (LANTUS) 100 UNIT/ML injection; Inject 0.3 mLs (30 Units total) into the skin 2 (two) times daily. -     sitaGLIPtin (JANUVIA) 100 MG tablet; Take 1 tablet (100 mg total) by mouth daily. -     insulin aspart (NOVOLOG) 100 UNIT/ML injection; Inject 12 Units into the skin 3 (three) times daily before meals. Take insulin when blood sugars are 201-250 give 4 units, 251-300 give 6 units, 301-350 give 8 units, 351-400 give 10 units,> 400 give 12 units and call M.D. Discussed hypoglycemia protocol. -     olmesartan-hydrochlorothiazide (BENICAR HCT) 40-25 MG tablet; Take 1 tablet by mouth daily.   Meds ordered this encounter  Medications   glipiZIDE (GLUCOTROL) 10 MG tablet    Sig: Take 1 tablet (10 mg total) by mouth 2 (two) times daily before a meal.    Dispense:  180 tablet    Refill:  3   insulin glargine (LANTUS) 100 UNIT/ML injection    Sig: Inject 0.3 mLs (30 Units total) into the skin 2 (two) times daily.    Dispense:  10 mL    Refill:  3    E11.10   sitaGLIPtin (JANUVIA) 100 MG tablet    Sig: Take 1 tablet (100 mg total) by mouth daily.    Dispense:  90 tablet    Refill:  1   clopidogrel (PLAVIX) 75 MG tablet    Sig: Take 1 tablet (75 mg total) by mouth daily.    Dispense:  90 tablet    Refill:  1   insulin aspart (NOVOLOG) 100 UNIT/ML injection    Sig: Inject 12 Units into the skin 3 (three) times daily before meals. Take insulin when blood sugars are 201-250 give 4 units, 251-300 give 6 units, 301-350 give 8 units, 351-400 give 10 units,> 400 give 12 units and call M.D. Discussed hypoglycemia protocol.    Dispense:  10 mL    Refill:  3   olmesartan-hydrochlorothiazide (BENICAR HCT) 40-25 MG tablet     Sig: Take 1 tablet by mouth daily.    Dispense:  90 tablet    Refill:  1    Follow-up: Return in about 6 weeks (around 11/22/2020) for Bp re ck.    Kerin Perna, NP

## 2020-10-12 LAB — CMP14+EGFR
ALT: 15 IU/L (ref 0–32)
AST: 11 IU/L (ref 0–40)
Albumin/Globulin Ratio: 1.5 (ref 1.2–2.2)
Albumin: 4.4 g/dL (ref 3.8–4.9)
Alkaline Phosphatase: 72 IU/L (ref 44–121)
BUN/Creatinine Ratio: 13 (ref 9–23)
BUN: 9 mg/dL (ref 6–24)
Bilirubin Total: 0.3 mg/dL (ref 0.0–1.2)
CO2: 19 mmol/L — ABNORMAL LOW (ref 20–29)
Calcium: 10.4 mg/dL — ABNORMAL HIGH (ref 8.7–10.2)
Chloride: 103 mmol/L (ref 96–106)
Creatinine, Ser: 0.7 mg/dL (ref 0.57–1.00)
GFR calc Af Amer: 115 mL/min/{1.73_m2} (ref >59)
GFR calc non Af Amer: 100 mL/min/{1.73_m2} (ref >59)
Globulin, Total: 3 g/dL (ref 1.5–4.5)
Glucose: 259 mg/dL — ABNORMAL HIGH (ref 65–99)
Potassium: 3.8 mmol/L (ref 3.5–5.2)
Sodium: 136 mmol/L (ref 134–144)
Total Protein: 7.4 g/dL (ref 6.0–8.5)

## 2020-10-12 LAB — CBC WITH DIFFERENTIAL/PLATELET
Basophils Absolute: 0 10*3/uL (ref 0.0–0.2)
Basos: 0 %
EOS (ABSOLUTE): 0 10*3/uL (ref 0.0–0.4)
Eos: 1 %
Hematocrit: 50 % — ABNORMAL HIGH (ref 34.0–46.6)
Hemoglobin: 17.5 g/dL — ABNORMAL HIGH (ref 11.1–15.9)
Immature Grans (Abs): 0 10*3/uL (ref 0.0–0.1)
Immature Granulocytes: 0 %
Lymphocytes Absolute: 1.9 10*3/uL (ref 0.7–3.1)
Lymphs: 29 %
MCH: 32.3 pg (ref 26.6–33.0)
MCHC: 35 g/dL (ref 31.5–35.7)
MCV: 92 fL (ref 79–97)
Monocytes Absolute: 0.5 10*3/uL (ref 0.1–0.9)
Monocytes: 7 %
Neutrophils Absolute: 4.1 10*3/uL (ref 1.4–7.0)
Neutrophils: 63 %
Platelets: 320 10*3/uL (ref 150–450)
RBC: 5.41 x10E6/uL — ABNORMAL HIGH (ref 3.77–5.28)
RDW: 12.7 % (ref 11.7–15.4)
WBC: 6.5 10*3/uL (ref 3.4–10.8)

## 2020-10-12 LAB — LIPID PANEL
Chol/HDL Ratio: 7.6 ratio — ABNORMAL HIGH (ref 0.0–4.4)
Cholesterol, Total: 243 mg/dL — ABNORMAL HIGH (ref 100–199)
HDL: 32 mg/dL — ABNORMAL LOW (ref >39)
LDL Chol Calc (NIH): 171 mg/dL — ABNORMAL HIGH (ref 0–99)
Triglycerides: 213 mg/dL — ABNORMAL HIGH (ref 0–149)
VLDL Cholesterol Cal: 40 mg/dL (ref 5–40)

## 2020-10-14 ENCOUNTER — Other Ambulatory Visit (INDEPENDENT_AMBULATORY_CARE_PROVIDER_SITE_OTHER): Payer: Self-pay | Admitting: Primary Care

## 2020-10-14 DIAGNOSIS — E782 Mixed hyperlipidemia: Secondary | ICD-10-CM

## 2020-10-14 MED ORDER — ATORVASTATIN CALCIUM 80 MG PO TABS: 80.0000 mg | ORAL_TABLET | Freq: Every day | ORAL | 1 refills | Status: DC

## 2020-10-14 MED FILL — ATORVASTATIN CALCIUM 80 MG: 80 | 90 days supply | Qty: 90 | Fill #0

## 2020-10-15 ENCOUNTER — Telehealth (INDEPENDENT_AMBULATORY_CARE_PROVIDER_SITE_OTHER): Payer: Self-pay

## 2020-10-15 NOTE — Telephone Encounter (Signed)
-----   Message from Grayce Sessions, NP sent at 10/14/2020  4:34 PM EST ----- Cholesterol is elevated this can lead to heart attack and stroke. To lower your number you can decrease your fatty foods, red meat, cheese, milk and increase fiber like whole grains and veggies.  Increase time of starting statin to 80 mg at bedtime

## 2020-10-15 NOTE — Telephone Encounter (Signed)
Patient is aware that cholesterol is elevated. Atorvastatin increased to 80 mg. Maryjean Morn, CMA

## 2020-10-20 MED FILL — LANTUS 100 UNITS/ML VIAL: 100 | 32 days supply | Qty: 20 | Fill #0

## 2020-10-29 LAB — CUP PACEART REMOTE DEVICE CHECK
Date Time Interrogation Session: 20211121000053
Implantable Pulse Generator Implant Date: 20180725

## 2020-11-01 ENCOUNTER — Ambulatory Visit (INDEPENDENT_AMBULATORY_CARE_PROVIDER_SITE_OTHER): Payer: 59

## 2020-11-01 DIAGNOSIS — I639 Cerebral infarction, unspecified: Secondary | ICD-10-CM | POA: Diagnosis not present

## 2020-11-08 NOTE — Progress Notes (Signed)
[facility] Summary Report / Loop Recorder 

## 2020-11-11 ENCOUNTER — Other Ambulatory Visit (INDEPENDENT_AMBULATORY_CARE_PROVIDER_SITE_OTHER): Payer: Self-pay | Admitting: Primary Care

## 2020-11-11 MED FILL — LOSARTAN POTASSIUM 100 MG T: 100 | 90 days supply | Qty: 90 | Fill #4

## 2020-11-11 MED FILL — AMLODIPINE BESYLATE 10 MG T: 10 | 90 days supply | Qty: 90 | Fill #4

## 2020-11-11 MED FILL — ATORVASTATIN CALCIUM 40 MG: 40 | 60 days supply | Qty: 60 | Fill #4

## 2020-11-11 MED FILL — CLOPIDOGREL 75 MG TABLET: 75 | 60 days supply | Qty: 60 | Fill #4

## 2020-11-11 MED FILL — glipiZIDE 10 MG TABS: 10 | 90 days supply | Qty: 180 | Fill #4

## 2020-11-11 MED FILL — JANUVIA 100 MG TABLET: 100 | 30 days supply | Qty: 30 | Fill #4

## 2020-11-11 MED FILL — $LANTUS 100 UNITS/ML VIAL: 100 | 32 days supply | Qty: 20 | Fill #0

## 2020-11-11 NOTE — Telephone Encounter (Signed)
Lantus insulin dosage change on 10/11/20 to 0.3 (30 units) twice daily. Refusing request-change not appropriate.

## 2020-11-22 ENCOUNTER — Ambulatory Visit (INDEPENDENT_AMBULATORY_CARE_PROVIDER_SITE_OTHER): Payer: 59 | Admitting: Primary Care

## 2020-11-29 IMAGING — CT CT HEAD CODE STROKE W/O CM
3 series · 15 of 47 positions shown, 18 images · non-contrast
Comparison: 10/02/2017 CT head.

CLINICAL DATA: Code stroke. 53 y/o F; headache and seeing double.
History of stroke.

EXAM:
CT HEAD WITHOUT CONTRAST
TECHNIQUE: Contiguous axial images were obtained from the base of the skull
through the vertex without intravenous contrast.

[Series 2: head w o · axial · 0.43mm/px · z∈[+1448,+1578]mm · 9 of 32 slices shown, 12 images]
[im 3/32  brain]
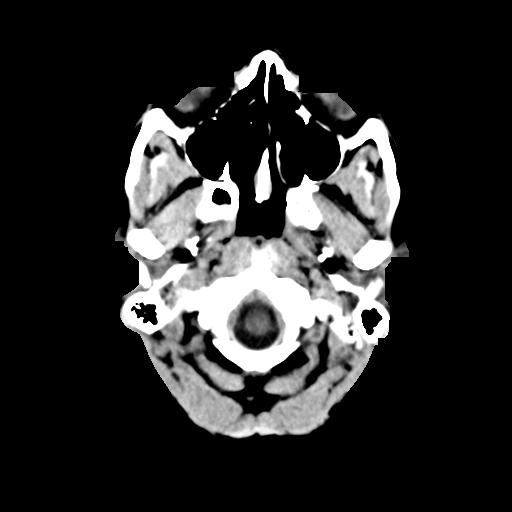
[im 3/32  bone]
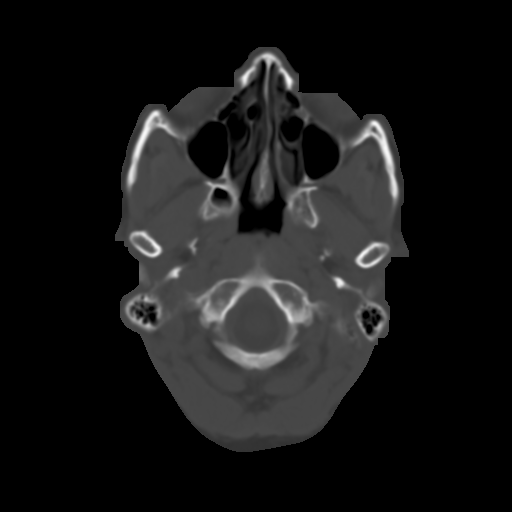
[im 6/32  brain]
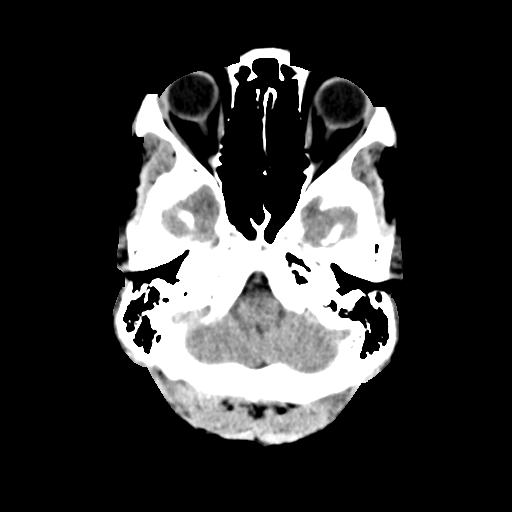
[im 9/32  brain]
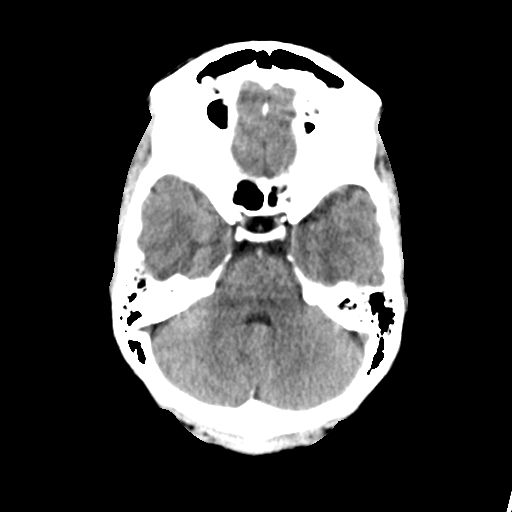
[im 12/32  brain]
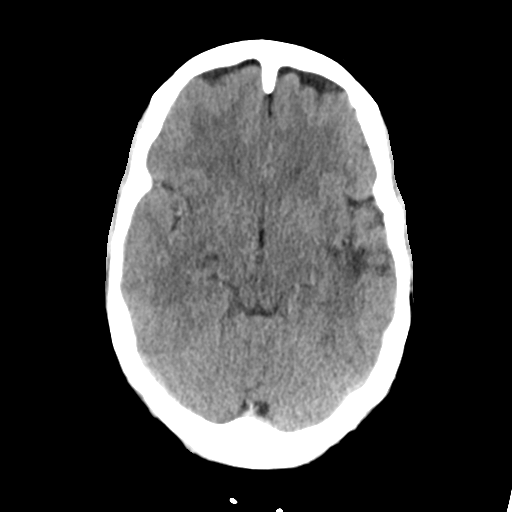
[im 17/32  brain]
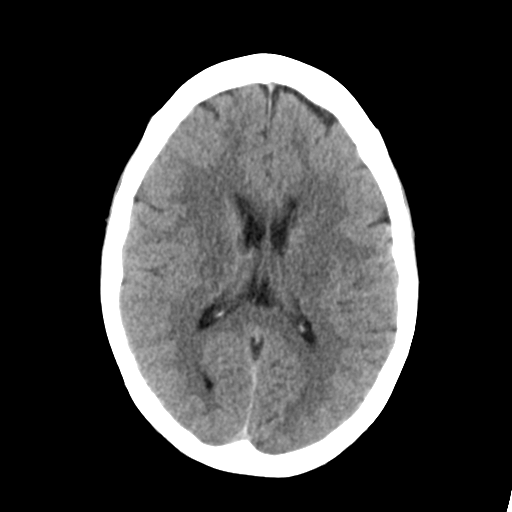
[im 17/32  bone]
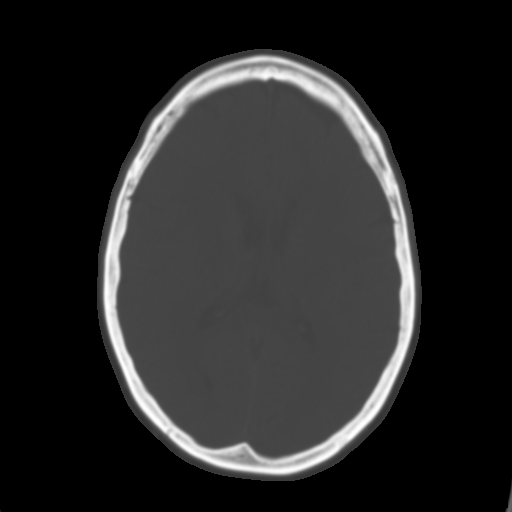
[im 20/32  brain]
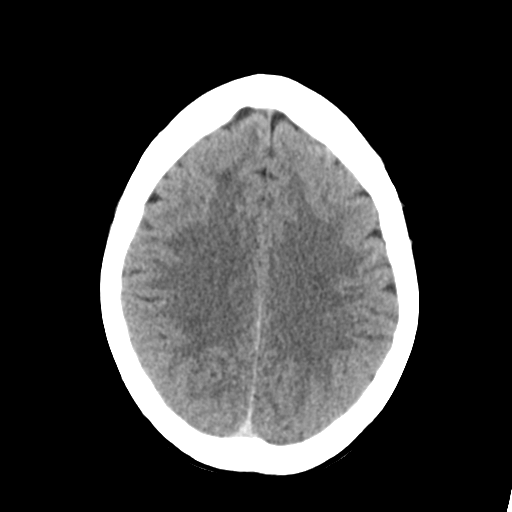
[im 23/32  brain]
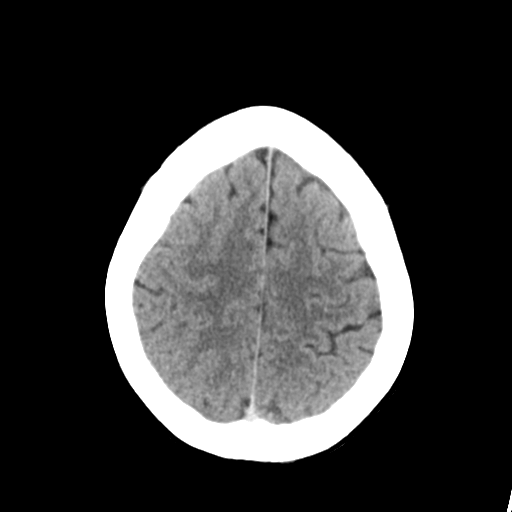
[im 26/32  brain]
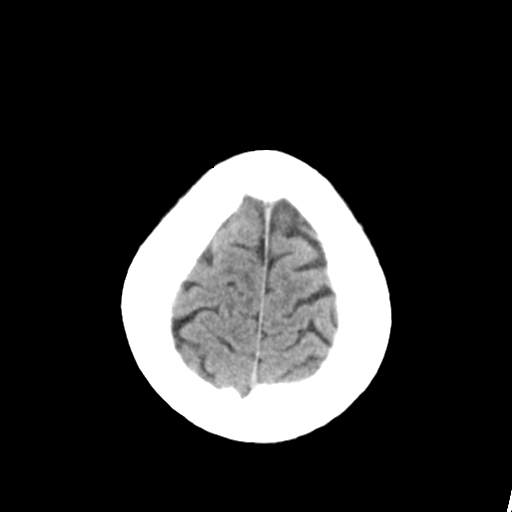
[im 29/32  brain]
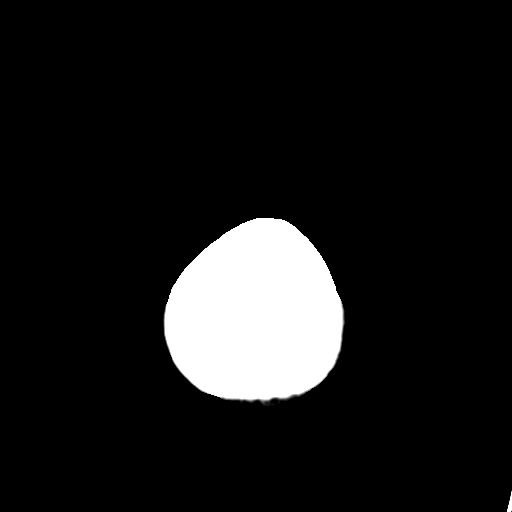
[im 29/32  bone]
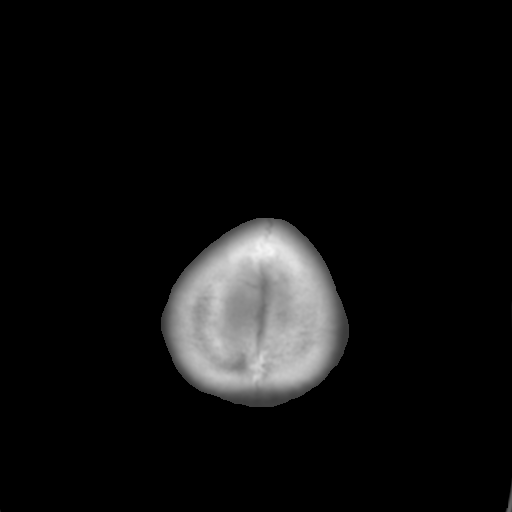

[Series 4: coronal soft · coronal · 0.31mm/px · 3 of 70 slices shown]
[im 24/70  brain]
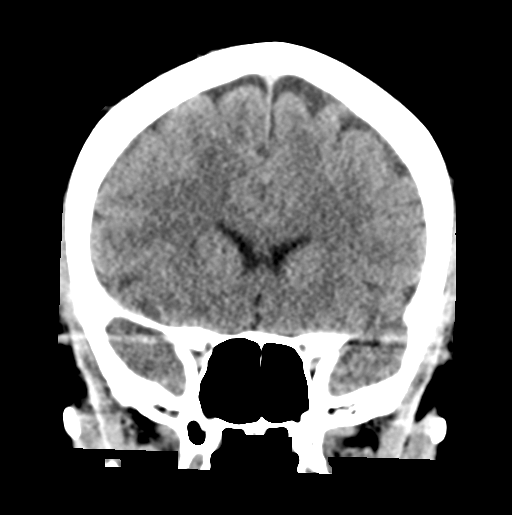
[im 31/70  brain]
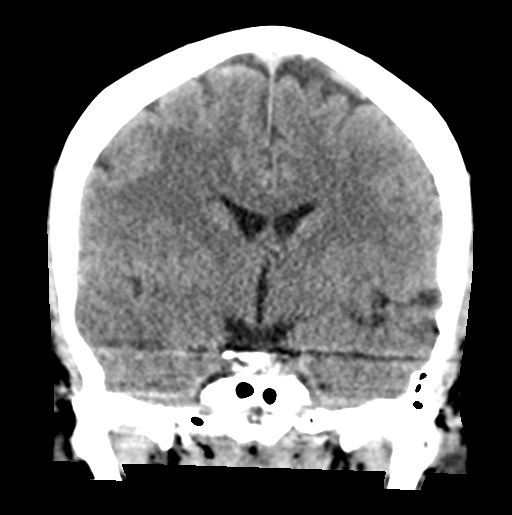
[im 39/70  brain]
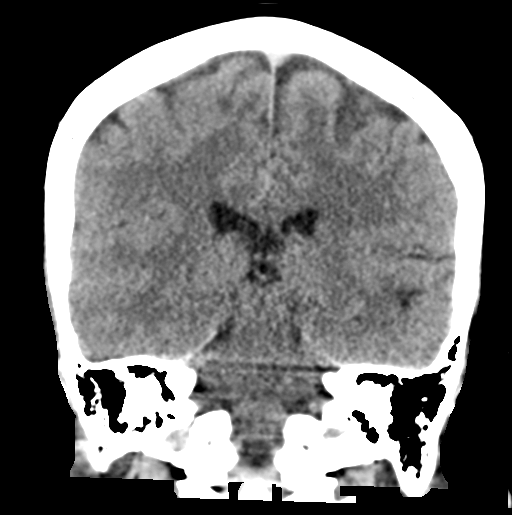

[Series 5: sagittal soft · sagittal · 0.31mm/px · 3 of 54 slices shown]
[im 18/54  brain]
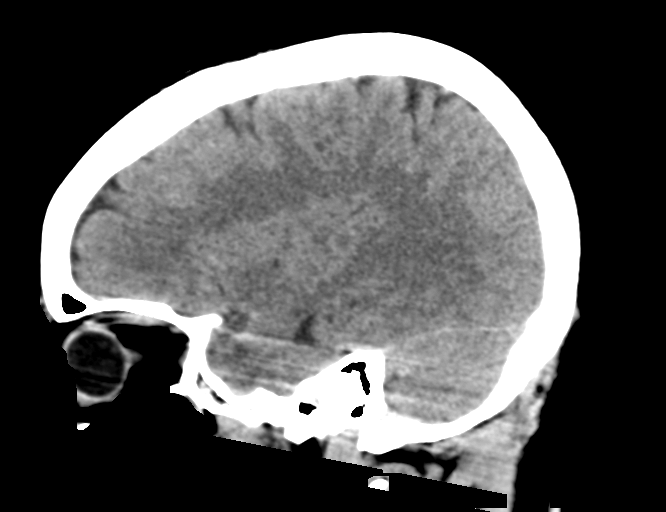
[im 27/54  brain]
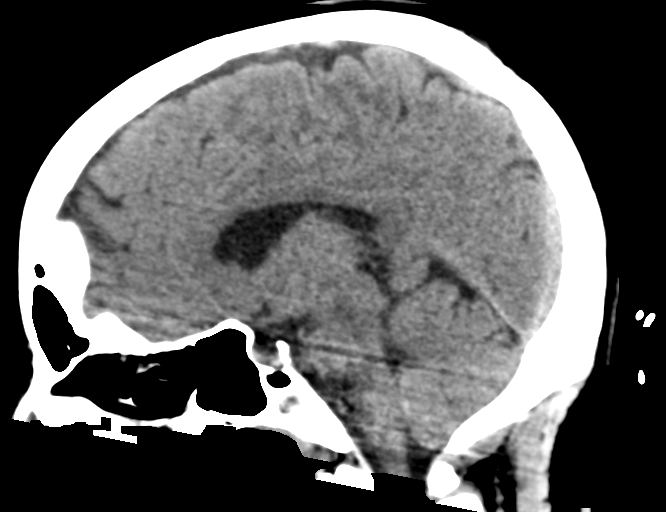
[im 36/54  brain]
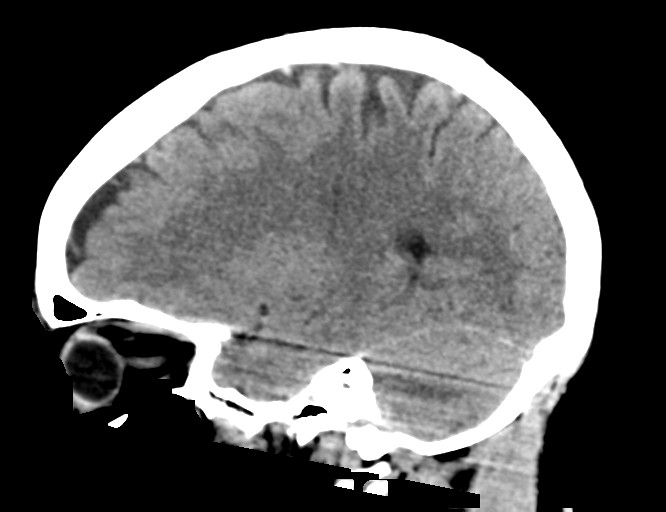

[15 of 47 positions shown; findings below may reference images not displayed]

FINDINGS: Brain: Stable small chronic infarctions within the left superior
temporal lobe and the left lateral frontal subcortical white matter.
Stable nonspecific white matter hypodensities compatible with
chronic microvascular ischemic changes. No findings of acute stroke,
hemorrhage, extra-axial collection, mass effect, hydrocephalus, or
herniation.

Vascular: Calcific atherosclerosis of carotid siphons. No hyperdense
vessel identified.

Skull: Normal. Negative for fracture or focal lesion.

Sinuses/Orbits: No acute finding.

Other: None.

ASPECTS ([place]berta Stroke Program Early CT Score)

- Ganglionic level infarction (caudate, lentiform nuclei, internal
capsule, insula, M1-M3 cortex): 7

- Supraganglionic infarction (M4-M6 cortex): 3

Total score (0-10 with 10 being normal): 10
IMPRESSION: 1. No acute intracranial abnormality identified.
2. Stable small chronic infarctions within the left temporal lobe
and left frontal lobe. Stable chronic microvascular ischemic changes
of the brain.
3. ASPECTS is 10.

These results were called by telephone at the time of interpretation
on 05/21/2019 at [DATE] to Dr. FERIENHAUS ERXLEBEN , who verbally
acknowledged these results.

## 2020-11-29 IMAGING — DX CHEST - 2 VIEW
2 series · 2 of 2 positions shown · non-contrast
Comparison: 02/20/2019

CLINICAL DATA: Double vision.

EXAM:
CHEST - 2 VIEW

[chest pa]
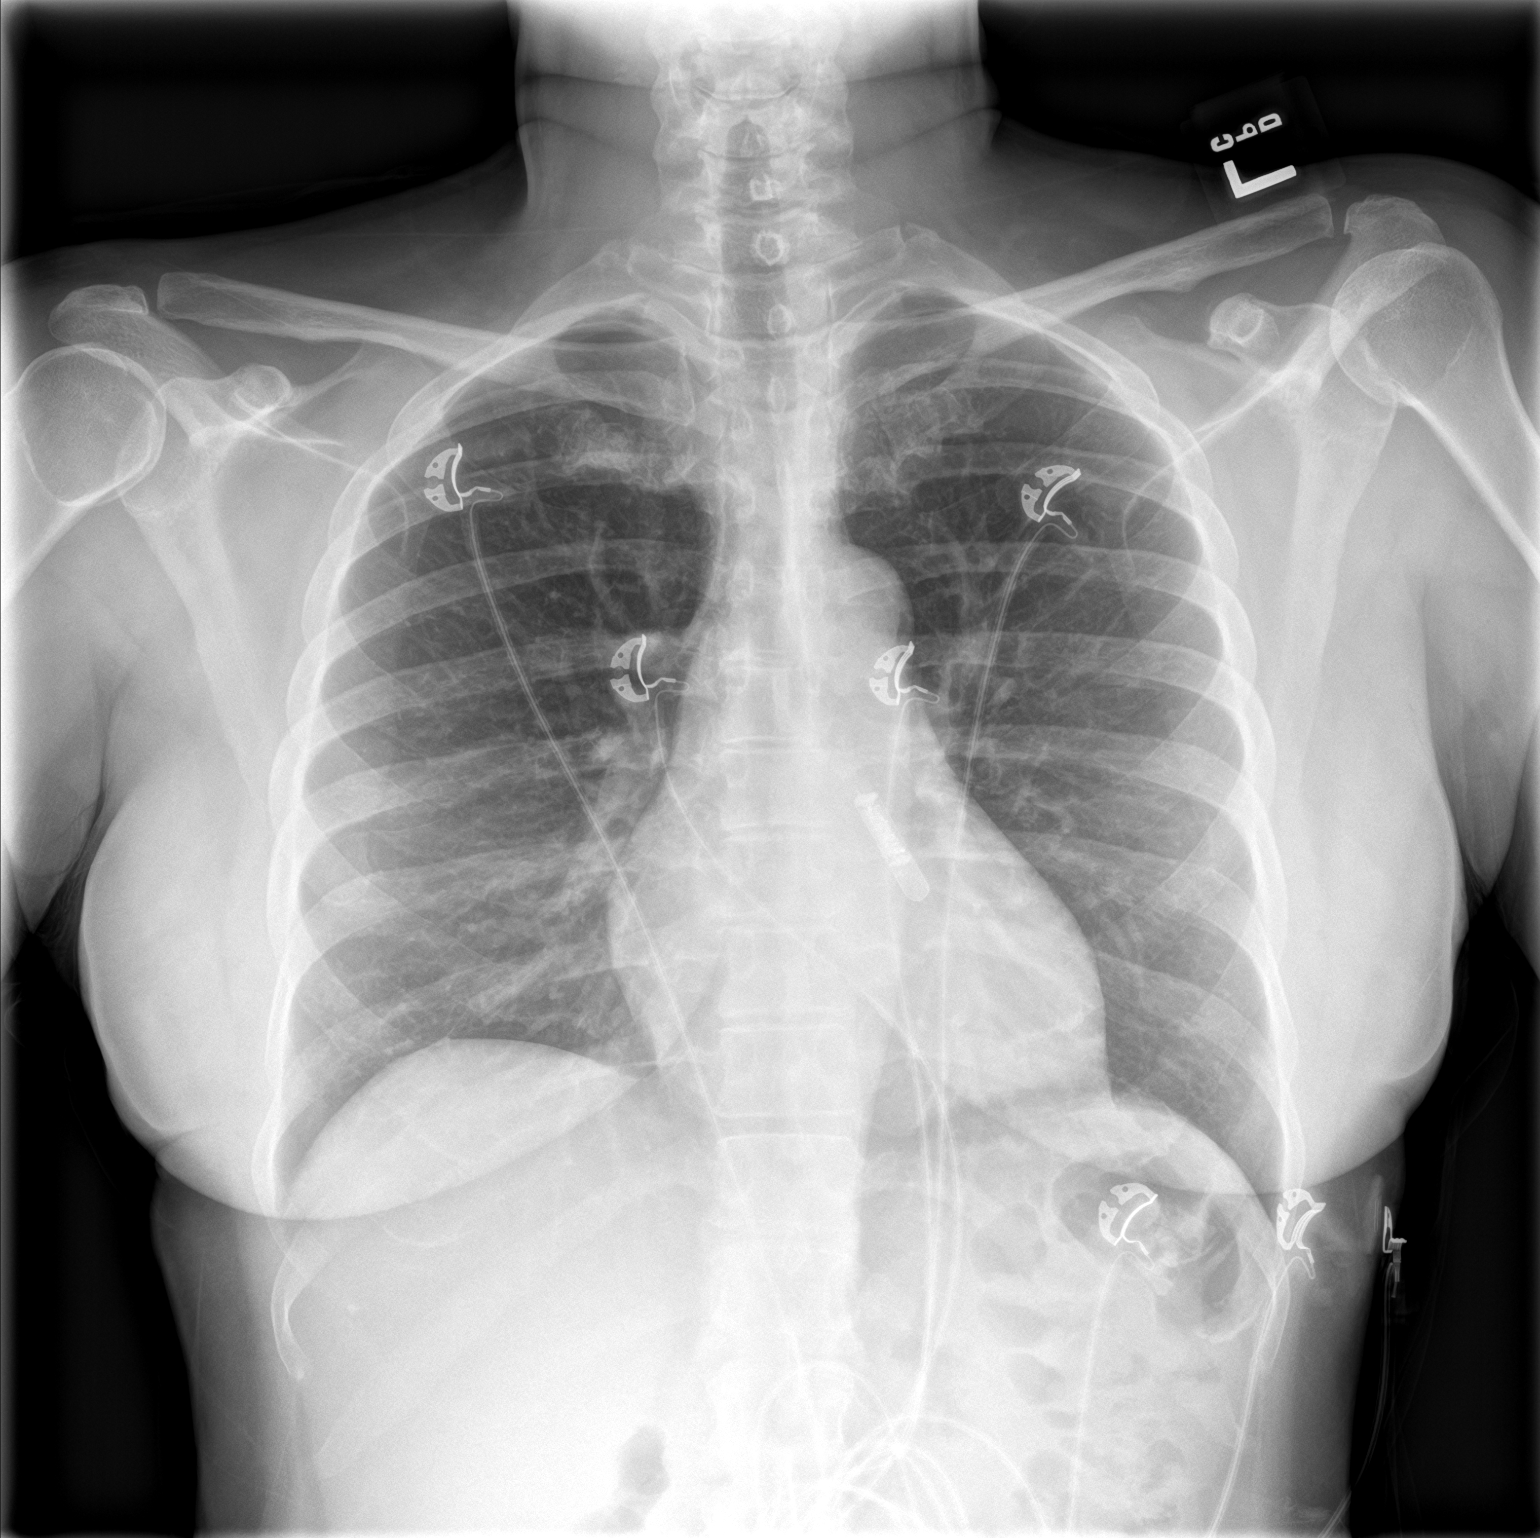

[chest lat]
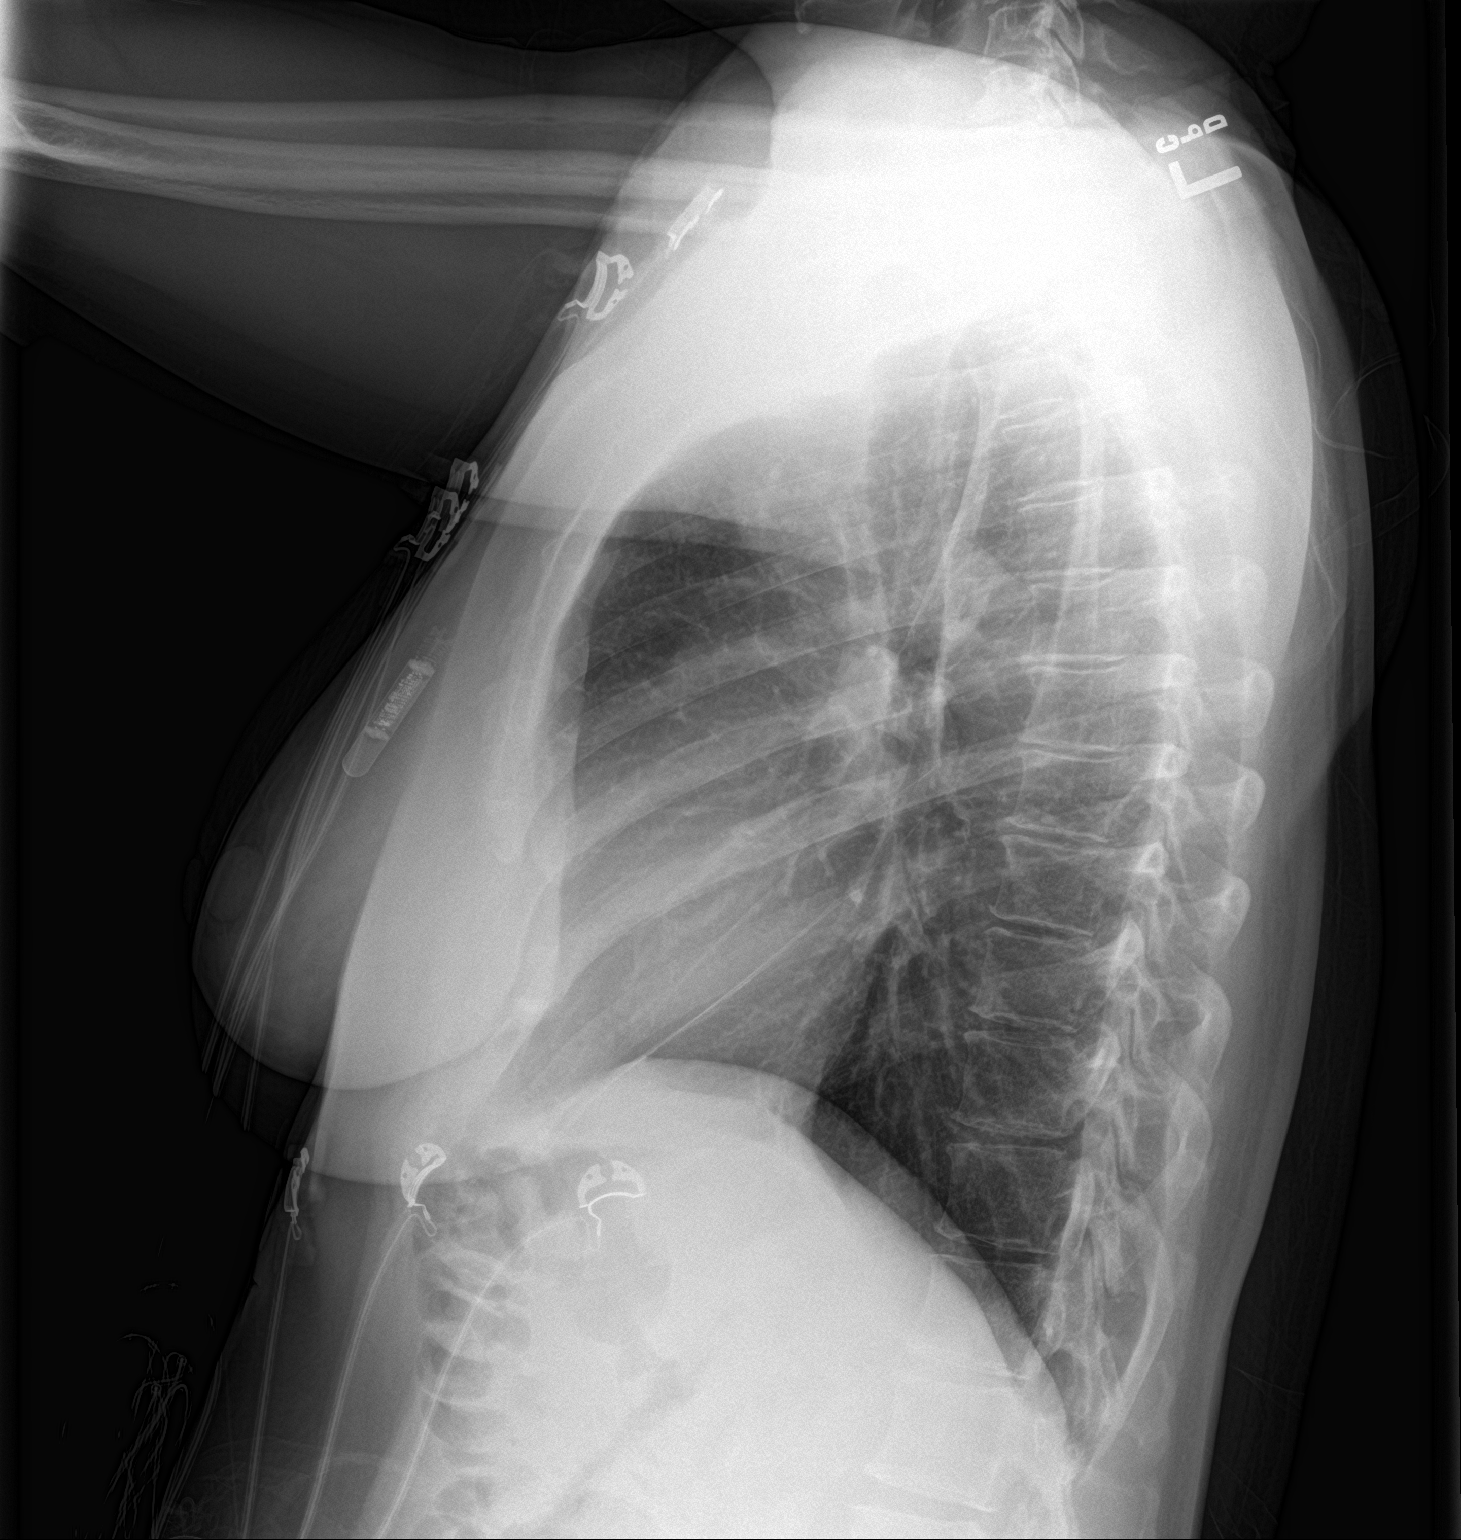

[2 of 2 positions shown; findings below may reference images not displayed]

FINDINGS: Implanted loop recorder again projects over the left chest wall.The
cardiomediastinal contours are normal. The lungs are clear.
Pulmonary vasculature is normal. No consolidation, pleural effusion,
or pneumothorax. No acute osseous abnormalities are seen.
IMPRESSION: No acute chest findings.

## 2020-12-06 ENCOUNTER — Ambulatory Visit (INDEPENDENT_AMBULATORY_CARE_PROVIDER_SITE_OTHER): Payer: 59

## 2020-12-06 DIAGNOSIS — I639 Cerebral infarction, unspecified: Secondary | ICD-10-CM

## 2020-12-07 LAB — CUP PACEART REMOTE DEVICE CHECK
Date Time Interrogation Session: 20220102000009
Implantable Pulse Generator Implant Date: 20180725

## 2020-12-21 NOTE — Progress Notes (Signed)
[facility] Summary Report / Loop Recorder 

## 2021-01-03 ENCOUNTER — Other Ambulatory Visit (INDEPENDENT_AMBULATORY_CARE_PROVIDER_SITE_OTHER): Payer: Self-pay | Admitting: Primary Care

## 2021-01-03 DIAGNOSIS — E782 Mixed hyperlipidemia: Secondary | ICD-10-CM

## 2021-01-03 MED FILL — CLOPIDOGREL 75 MG TABLET: 75 | 90 days supply | Qty: 90 | Fill #0

## 2021-01-03 MED FILL — JANUVIA 100 MG TABLET: 100 | 30 days supply | Qty: 30 | Fill #5

## 2021-01-07 LAB — CUP PACEART REMOTE DEVICE CHECK
Date Time Interrogation Session: 20220204000043
Implantable Pulse Generator Implant Date: 20180725

## 2021-01-10 ENCOUNTER — Ambulatory Visit (INDEPENDENT_AMBULATORY_CARE_PROVIDER_SITE_OTHER): Payer: 59

## 2021-01-10 DIAGNOSIS — I639 Cerebral infarction, unspecified: Secondary | ICD-10-CM

## 2021-01-17 NOTE — Progress Notes (Signed)
[facility] Summary Report / Loop Recorder 

## 2021-02-14 ENCOUNTER — Ambulatory Visit (INDEPENDENT_AMBULATORY_CARE_PROVIDER_SITE_OTHER): Payer: 59

## 2021-02-14 DIAGNOSIS — I639 Cerebral infarction, unspecified: Secondary | ICD-10-CM

## 2021-02-16 LAB — CUP PACEART REMOTE DEVICE CHECK
Date Time Interrogation Session: 20220309005610
Implantable Pulse Generator Implant Date: 20180725

## 2021-02-23 NOTE — Progress Notes (Signed)
[facility] Summary Report / Loop Recorder 

## 2021-03-05 ENCOUNTER — Other Ambulatory Visit: Payer: Self-pay

## 2021-03-21 ENCOUNTER — Ambulatory Visit (INDEPENDENT_AMBULATORY_CARE_PROVIDER_SITE_OTHER): Payer: 59

## 2021-03-21 DIAGNOSIS — I639 Cerebral infarction, unspecified: Secondary | ICD-10-CM

## 2021-03-22 LAB — CUP PACEART REMOTE DEVICE CHECK
Date Time Interrogation Session: 20220411015651
Implantable Pulse Generator Implant Date: 20180725

## 2021-03-29 ENCOUNTER — Other Ambulatory Visit: Payer: Self-pay

## 2021-03-29 MED FILL — Losartan Potassium Tab 100 MG: ORAL | 30 days supply | Qty: 30 | Fill #0 | Status: AC

## 2021-03-29 MED FILL — Sitagliptin Phosphate Tab 100 MG (Base Equiv): ORAL | 30 days supply | Qty: 30 | Fill #0 | Status: AC

## 2021-03-29 MED FILL — Glipizide Tab 10 MG: ORAL | 30 days supply | Qty: 60 | Fill #0 | Status: AC

## 2021-03-29 MED FILL — Amlodipine Besylate Tab 10 MG (Base Equivalent): ORAL | 30 days supply | Qty: 30 | Fill #0 | Status: AC

## 2021-04-01 ENCOUNTER — Other Ambulatory Visit: Payer: Self-pay

## 2021-04-07 NOTE — Progress Notes (Signed)
[facility] Summary Report / Loop Recorder 

## 2021-04-14 ENCOUNTER — Emergency Department (HOSPITAL_COMMUNITY)
Admission: EM | Admit: 2021-04-14 | Discharge: 2021-04-14 | Disposition: A | Discharge status: Home / Self Care | Payer: Self-pay | Attending: Emergency Medicine | Admitting: Emergency Medicine

## 2021-04-14 ENCOUNTER — Encounter (HOSPITAL_COMMUNITY): Payer: Self-pay

## 2021-04-14 ENCOUNTER — Other Ambulatory Visit: Payer: Self-pay

## 2021-04-14 DIAGNOSIS — F17210 Nicotine dependence, cigarettes, uncomplicated: Secondary | ICD-10-CM | POA: Insufficient documentation

## 2021-04-14 DIAGNOSIS — E119 Type 2 diabetes mellitus without complications: Secondary | ICD-10-CM | POA: Insufficient documentation

## 2021-04-14 DIAGNOSIS — Z8616 Personal history of COVID-19: Secondary | ICD-10-CM | POA: Insufficient documentation

## 2021-04-14 DIAGNOSIS — Z79899 Other long term (current) drug therapy: Secondary | ICD-10-CM | POA: Insufficient documentation

## 2021-04-14 DIAGNOSIS — L0291 Cutaneous abscess, unspecified: Secondary | ICD-10-CM

## 2021-04-14 DIAGNOSIS — Z794 Long term (current) use of insulin: Secondary | ICD-10-CM | POA: Insufficient documentation

## 2021-04-14 DIAGNOSIS — I10 Essential (primary) hypertension: Secondary | ICD-10-CM | POA: Insufficient documentation

## 2021-04-14 DIAGNOSIS — N764 Abscess of vulva: Secondary | ICD-10-CM | POA: Insufficient documentation

## 2021-04-14 DIAGNOSIS — Z7984 Long term (current) use of oral hypoglycemic drugs: Secondary | ICD-10-CM | POA: Insufficient documentation

## 2021-04-14 MED ORDER — DOXYCYCLINE HYCLATE 100 MG PO CAPS: 100.0000 mg | ORAL_CAPSULE | Freq: Two times a day (BID) | ORAL | 0 refills | Status: DC

## 2021-04-14 MED ORDER — LIDOCAINE HCL (PF) 1 % IJ SOLN
5.0000 mL | Freq: Once | INTRAMUSCULAR | Status: DC
  Filled 2021-04-14: qty 30

## 2021-04-14 NOTE — ED Triage Notes (Signed)
Pt presents to ED with complaints of abscess at right groin area, denies fever or drainage.

## 2021-04-14 NOTE — Discharge Instructions (Signed)
Recheck with your PCP in 2 days for packing removal. If unable to see your PCP, you can also recheck in urgent care or the ER.  Take as prescribed and complete the full course.  Continue to apply warm compresses to the area for 20 to time.  Return to the emergency room for fever, any worsening or concerning symptoms.

## 2021-04-14 NOTE — ED Provider Notes (Signed)
[facility] EMERGENCY DEPARTMENT Provider Note   CSN: [id number] Arrival date & time: 04/14/21  1324     History Chief Complaint  Patient presents with  . Abscess    Tammy Bauer is a 55 y.o. female.  55 year old female with history of diabetes presents with complaint of abscess to her right labia area x3 days, progressively worsening despite use of warm compresses.  Denies fevers, chills, nausea, vomiting.        Past Medical History:  Diagnosis Date  . Anemia   . Diabetes mellitus without complication (HCC)   . Hypertension   . Stroke [facility])    x2; no deficits  . Thyroid nodule 10/02/2017    Patient Active Problem List   Diagnosis Date Noted  . Pneumonia due to COVID-19 virus 06/20/2020  . Dehydration 06/20/2020  . Hyponatremia 06/20/2020  . Acute respiratory failure with hypoxia (HCC) 06/20/2020  . Diplopia 05/22/2019  . Headache 05/22/2019  . Hyperlipidemia 05/22/2019  . TIA (transient ischemic attack) 05/21/2019  . Vaginal bleeding 01/03/2018  . Acute blood loss anemia 01/03/2018  . Anemia 01/03/2018  . Acute ischemic stroke (HCC) 10/02/2017  . DM2 (diabetes mellitus, type 2) (HCC) 10/02/2017  . Acute CVA (cerebrovascular accident) (HCC) 06/25/2017  . Hypertensive emergency 06/25/2017  . Tobacco abuse 06/25/2017  . Essential hypertension 10/07/2015  . Hyperglycemia 10/07/2015    Past Surgical History:  Procedure Laterality Date  . ENDOMETRIAL ABLATION N/A 01/30/2018   Procedure: ENDOMETRIAL ABLATION WITH MINERVA;  Surgeon: Lazaro Arms, MD;  Location: [facility];  Service: Gynecology;  Laterality: N/A;  . HYSTEROSCOPY WITH D & C N/A 01/30/2018   Procedure: DILATATION AND CURETTAGE /HYSTEROSCOPY;  Surgeon: Lazaro Arms, MD;  Location: [facility];  Service: Gynecology;  Laterality: N/A;  . LOOP RECORDER INSERTION N/A 06/27/2017   Procedure: Loop Recorder Insertion;  Surgeon: Regan Lemming, MD;  [facility]: [facility];  Service: Cardiovascular;   Laterality: N/A;  . NERVE, TENDON AND ARTERY REPAIR Left 10/21/2019   Procedure: WOUND EXPLORATION LEFT HAND WITH REPAIR TENDON, ARTERY AND NERVE ;  Surgeon: Betha Loa, MD;  Location: [facility];  Service: Orthopedics;  Laterality: Left;  . TEE WITHOUT CARDIOVERSION N/A 06/27/2017   Procedure: TRANSESOPHAGEAL ECHOCARDIOGRAM (TEE);  Surgeon: [name], MD;  Location: [facility] ENDOSCOPY;  Service: Cardiovascular;  Laterality: N/A;     OB History   No obstetric history on file.     Family History  Problem Relation Age of Onset  . Stroke Maternal Grandfather     Social History   Tobacco Use  . Smoking status: Current Every Day Smoker    Packs/day: 0.25    Years: 20.00    Pack years: 5.00    Types: Cigarettes    Start date: 08/29/1999  . Smokeless tobacco: Never Used  . Tobacco comment: smoke 5 cigarettes a day  Vaping Use  . Vaping Use: Never used  Substance Use Topics  . Alcohol use: No  . Drug use: No    Home Medications Prior to Admission medications   Medication Sig Start Date End Date Taking? Authorizing Provider  doxycycline (VIBRAMYCIN) 100 MG capsule Take 1 capsule (100 mg total) by mouth 2 (two) times daily. 04/14/21  Yes Jeannie Fend, PA-C  acetaminophen (TYLENOL) 325 MG tablet Take 650 mg by mouth every 6 (six) hours as needed for mild pain or moderate pain.     [provider]  albuterol (VENTOLIN HFA) 108 (90 Base) MCG/ACT  inhaler Inhale 2 puffs into the lungs every 6 (six) hours. 06/22/20   Shon Hale, MD  amLODipine (NORVASC) 10 MG tablet Take 1 tablet (10 mg total) by mouth daily. TAKE ONE TABLET BY MOUTH ONCE DAILY 06/22/20   Shon Hale, MD  amLODipine (NORVASC) 10 MG tablet TAKE 1 TABLET (10 MG TOTAL) BY MOUTH DAILY. 04/29/20 05/01/21  Grayce Sessions, NP  ascorbic acid (VITAMIN C) 500 MG tablet Take 1 tablet (500 mg total) by mouth daily. 06/23/20   Shon Hale, MD  atorvastatin (LIPITOR) 80 MG tablet Take 1 tablet  (80 mg total) by mouth at bedtime. Any generic statin from [employer] list is okay 10/14/20   Grayce Sessions, NP  clopidogrel (PLAVIX) 75 MG tablet Take 1 tablet (75 mg total) by mouth daily. 10/11/20   Grayce Sessions, NP  glipiZIDE (GLUCOTROL) 10 MG tablet Take 1 tablet (10 mg total) by mouth 2 (two) times daily before a meal. 10/11/20   Edwards, Kinnie Scales, NP  glipiZIDE (GLUCOTROL) 10 MG tablet TAKE 1 TABLET (10 MG TOTAL) BY MOUTH 2 (TWO) TIMES DAILY BEFORE A MEAL. 04/29/20 05/01/21  Grayce Sessions, NP  insulin aspart (NOVOLOG) 100 UNIT/ML injection Inject 12 Units into the skin 3 (three) times daily before meals. Take insulin when blood sugars are 201-250 give 4 units, 251-300 give 6 units, 301-350 give 8 units, 351-400 give 10 units,> 400 give 12 units and call M.D. Discussed hypoglycemia protocol. 10/11/20   Grayce Sessions, NP  insulin glargine (LANTUS) 100 UNIT/ML injection Inject 0.3 mLs (30 Units total) into the skin 2 (two) times daily. 10/11/20   Grayce Sessions, NP  INSULIN SYRINGE 1CC/29G (B-D INSULIN SYRINGE) 29G X 1/2" 1 ML MISC 1 application by Does not apply route at bedtime. 06/01/20   Grayce Sessions, NP  losartan (COZAAR) 100 MG tablet TAKE 1 TABLET (100 MG TOTAL) BY MOUTH DAILY. 04/29/20 05/01/21  Grayce Sessions, NP  olmesartan-hydrochlorothiazide (BENICAR HCT) 40-25 MG tablet Take 1 tablet by mouth daily. 10/11/20   Grayce Sessions, NP  pantoprazole (PROTONIX) 40 MG tablet Take 1 tablet (40 mg total) by mouth daily. 06/23/20   Shon Hale, MD  sitaGLIPtin (JANUVIA) 100 MG tablet Take 1 tablet (100 mg total) by mouth daily. 10/11/20   Grayce Sessions, NP  sitaGLIPtin (JANUVIA) 100 MG tablet TAKE 1 TABLET (100 MG TOTAL) BY MOUTH DAILY. 04/29/20 05/01/21  Grayce Sessions, NP  zinc sulfate 220 (50 Zn) MG capsule Take 1 capsule (220 mg total) by mouth daily. 06/23/20   Shon Hale, MD    Allergies    Ace inhibitors  Review of Systems   Review  of Systems  Constitutional: Negative for fever.  Gastrointestinal: Negative for abdominal pain, nausea and vomiting.  Skin: Negative for rash and wound.  Allergic/Immunologic: Positive for immunocompromised state.  All other systems reviewed and are negative.   Physical Exam Updated Vital Signs BP (!) 180/76 (BP Location: Right Arm)   Pulse (!) 106   Temp 98.6 F (37 C) (Oral)   Resp 18   Ht 5\' 2"  (1.575 m)   Wt 65.8 kg   SpO2 95%   BMI 26.52 kg/m   Physical Exam Vitals and nursing note reviewed. Exam conducted with a chaperone present.  Constitutional:      General: She is not in acute distress.    Appearance: She is well-developed. She is not diaphoretic.  HENT:     Head: Normocephalic and atraumatic.  Pulmonary:     Effort: Pulmonary effort is normal.  Abdominal:     Palpations: Abdomen is soft.     Tenderness: There is no abdominal tenderness.  Genitourinary:    Comments: Moderate erythema, induration to right labia with fluctuance at the mons pubis, no active drainage Skin:    General: Skin is warm and dry.  Neurological:     Mental Status: She is alert and oriented to person, place, and time.  Psychiatric:        Behavior: Behavior normal.     ED Results / Procedures / Treatments   Labs (all labs ordered are listed, but only abnormal results are displayed) Labs Reviewed - No data to display  EKG None  Radiology No results found.  Procedures .Marland Kitchen.Incision and Drainage  Date/Time: 04/14/2021 11:33 AM Performed by: Jeannie FendMurphy, Harol Shabazz A, PA-C Authorized by: Jeannie FendMurphy, Aithana Kushner A, PA-C   Consent:    Consent obtained:  Verbal   Consent given by:  Patient   Risks discussed:  Bleeding, incomplete drainage, pain and damage to other organs   Alternatives discussed:  No treatment Universal protocol:    Procedure explained and questions answered to patient or proxy's satisfaction: yes     Relevant documents present and verified: yes     Test results available : yes      Imaging studies available: yes     Required blood products, implants, devices, and special equipment available: yes     Site/side marked: yes     Immediately prior to procedure, a time out was called: yes     Patient identity confirmed:  Verbally with patient Location:    Type:  Abscess   Size:  4cm x 5cm   Location:  Anogenital   Anogenital location: right labia to mons pubis. Pre-procedure details:    Skin preparation:  Betadine Sedation:    Sedation type:  None Anesthesia:    Anesthesia method:  Local infiltration   Local anesthetic:  Lidocaine 1% w/o epi Procedure type:    Complexity:  Complex Procedure details:    Incision types:  Single straight   Incision depth:  Subcutaneous   Wound management:  Probed and deloculated, irrigated with saline and extensive cleaning   Drainage:  Purulent   Drainage amount:  Copious   Packing materials:  1/4 in gauze   Amount 1/4":  3 Post-procedure details:    Procedure completion:  Tolerated well, no immediate complications     Medications Ordered in ED Medications  lidocaine (PF) (XYLOCAINE) 1 % injection 5 mL (has no administration in time range)    ED Course  I have reviewed the triage vital signs and the nursing notes.  Pertinent labs & imaging results that were available during my care of the patient were reviewed by me and considered in my medical decision making (see chart for details).  Clinical Course as of 04/14/21 1135  Thu Apr 14, 2021  [id number] 55 year old female with history of diabetes presents with complaint of right leg abscess.  On exam, found to have moderate swelling, erythema, induration and central area of fluctuance to the right labia/mons pubis area.  I&D of this area with copious amounts of brown purulent discharge.  Packing placed.  Patient started on doxycycline and advised to return to the ED for fevers, new or worsening symptoms otherwise see PCP, urgent care or return to ED for packing removal in 2 days.   Can continue with warm compresses. [LM]    Clinical Course User  Index [LM] Alden Hipp   MDM Rules/Calculators/A&P                          Final Clinical Impression(s) / ED Diagnoses Final diagnoses:  Abscess    Rx / DC Orders ED Discharge Orders         Ordered    doxycycline (VIBRAMYCIN) 100 MG capsule  2 times daily        04/14/21 1126           Alden Hipp 04/14/21 1135    Bethann Berkshire, MD 04/15/21 1636

## 2021-04-16 ENCOUNTER — Encounter (HOSPITAL_COMMUNITY): Payer: Self-pay | Admitting: *Deleted

## 2021-04-16 ENCOUNTER — Emergency Department (HOSPITAL_COMMUNITY)
Admission: EM | Admit: 2021-04-16 | Discharge: 2021-04-16 | Disposition: A | Discharge status: Home / Self Care | Payer: Self-pay | Attending: Emergency Medicine | Admitting: Emergency Medicine

## 2021-04-16 DIAGNOSIS — Z794 Long term (current) use of insulin: Secondary | ICD-10-CM | POA: Insufficient documentation

## 2021-04-16 DIAGNOSIS — N764 Abscess of vulva: Secondary | ICD-10-CM | POA: Insufficient documentation

## 2021-04-16 DIAGNOSIS — Z7984 Long term (current) use of oral hypoglycemic drugs: Secondary | ICD-10-CM | POA: Insufficient documentation

## 2021-04-16 DIAGNOSIS — Z5189 Encounter for other specified aftercare: Secondary | ICD-10-CM

## 2021-04-16 DIAGNOSIS — E119 Type 2 diabetes mellitus without complications: Secondary | ICD-10-CM | POA: Insufficient documentation

## 2021-04-16 DIAGNOSIS — Z4800 Encounter for change or removal of nonsurgical wound dressing: Secondary | ICD-10-CM | POA: Insufficient documentation

## 2021-04-16 DIAGNOSIS — F17210 Nicotine dependence, cigarettes, uncomplicated: Secondary | ICD-10-CM | POA: Insufficient documentation

## 2021-04-16 DIAGNOSIS — Z7902 Long term (current) use of antithrombotics/antiplatelets: Secondary | ICD-10-CM | POA: Insufficient documentation

## 2021-04-16 DIAGNOSIS — Z79899 Other long term (current) drug therapy: Secondary | ICD-10-CM | POA: Insufficient documentation

## 2021-04-16 DIAGNOSIS — I10 Essential (primary) hypertension: Secondary | ICD-10-CM | POA: Insufficient documentation

## 2021-04-16 DIAGNOSIS — Z8616 Personal history of COVID-19: Secondary | ICD-10-CM | POA: Insufficient documentation

## 2021-04-16 NOTE — Discharge Instructions (Addendum)
Your exam looks reassuring, please continue with the medications as prescribed.  For pain you may take ibuprofen or Tylenol every 6 hours as needed please follow dosing on the back  of bottle.  I want you to continue apply warm compresses to the area 2 times daily for next 3 days.  I recommend follow-up with your PCP in 1 week's time for recheck of your abscess.  Please come back to emergency department if you develop fevers, chills, worsening redness, pain, severe amount of discharge from the area as these are symptoms requiring further evaluation.

## 2021-04-16 NOTE — ED Provider Notes (Signed)
[facility] EMERGENCY DEPARTMENT Provider Note   CSN: [id number] Arrival date & time: 04/16/21  1208     History No chief complaint on file.   Tammy Bauer is a 55 y.o. female.  HPI    Patient with significant medical history of diabetes, hypertension, stroke presents to the emergency department with chief complaint of wound check.  Patient was seen on 05/12 for a abscess of her right labia, area was I&D a large amount of purulent discharge was expelled and wound was packed.  She was placed on doxycycline and was told to come back for a wound check.  Patient states she is overall feeling much better, she says pain has overall improved, has slight discharge but not as much as it was before, redness has gone down, states she is tolerating her medications as prescribed.  She denies systemic infection like fevers or chills.  Patient denies headaches, fevers, chills, shortness breath, chest pain, abdominal pain, nausea, vomit, diarrhea.   Past Medical History:  Diagnosis Date  . Anemia   . Diabetes mellitus without complication (HCC)   . Hypertension   . Stroke [facility])    x2; no deficits  . Thyroid nodule 10/02/2017    Patient Active Problem List   Diagnosis Date Noted  . Pneumonia due to COVID-19 virus 06/20/2020  . Dehydration 06/20/2020  . Hyponatremia 06/20/2020  . Acute respiratory failure with hypoxia (HCC) 06/20/2020  . Diplopia 05/22/2019  . Headache 05/22/2019  . Hyperlipidemia 05/22/2019  . TIA (transient ischemic attack) 05/21/2019  . Vaginal bleeding 01/03/2018  . Acute blood loss anemia 01/03/2018  . Anemia 01/03/2018  . Acute ischemic stroke (HCC) 10/02/2017  . DM2 (diabetes mellitus, type 2) (HCC) 10/02/2017  . Acute CVA (cerebrovascular accident) (HCC) 06/25/2017  . Hypertensive emergency 06/25/2017  . Tobacco abuse 06/25/2017  . Essential hypertension 10/07/2015  . Hyperglycemia 10/07/2015    Past Surgical History:  Procedure Laterality Date  . ENDOMETRIAL  ABLATION N/A 01/30/2018   Procedure: ENDOMETRIAL ABLATION WITH MINERVA;  Surgeon: Lazaro Arms, MD;  Location: [facility];  Service: Gynecology;  Laterality: N/A;  . HYSTEROSCOPY WITH D & C N/A 01/30/2018   Procedure: DILATATION AND CURETTAGE /HYSTEROSCOPY;  Surgeon: Lazaro Arms, MD;  Location: [facility];  Service: Gynecology;  Laterality: N/A;  . LOOP RECORDER INSERTION N/A 06/27/2017   Procedure: Loop Recorder Insertion;  Surgeon: Regan Lemming, MD;  Location: [facility];  Service: Cardiovascular;  Laterality: N/A;  . NERVE, TENDON AND ARTERY REPAIR Left 10/21/2019   Procedure: WOUND EXPLORATION LEFT HAND WITH REPAIR TENDON, ARTERY AND NERVE ;  Surgeon: Betha Loa, MD;  Location: [facility];  Service: Orthopedics;  Laterality: Left;  . TEE WITHOUT CARDIOVERSION N/A 06/27/2017   Procedure: TRANSESOPHAGEAL ECHOCARDIOGRAM (TEE);  Surgeon: [name], MD;  Location: [facility] ENDOSCOPY;  Service: Cardiovascular;  Laterality: N/A;     OB History   No obstetric history on file.     Family History  Problem Relation Age of Onset  . Stroke Maternal Grandfather     Social History   Tobacco Use  . Smoking status: Current Every Day Smoker    Packs/day: 0.25    Years: 20.00    Pack years: 5.00    Types: Cigarettes    Start date: 08/29/1999  . Smokeless tobacco: Never Used  . Tobacco comment: smoke 5 cigarettes a day  Vaping Use  . Vaping Use: Never used  Substance Use Topics  . Alcohol use:  No  . Drug use: No    Home Medications Prior to Admission medications   Medication Sig Start Date End Date Taking? Authorizing Provider  acetaminophen (TYLENOL) 325 MG tablet Take 650 mg by mouth every 6 (six) hours as needed for mild pain or moderate pain.     [provider]  albuterol (VENTOLIN HFA) 108 (90 Base) MCG/ACT inhaler Inhale 2 puffs into the lungs every 6 (six) hours. 06/22/20   Shon Hale, MD  amLODipine (NORVASC) 10 MG tablet Take 1 tablet (10  mg total) by mouth daily. TAKE ONE TABLET BY MOUTH ONCE DAILY 06/22/20   Shon Hale, MD  amLODipine (NORVASC) 10 MG tablet TAKE 1 TABLET (10 MG TOTAL) BY MOUTH DAILY. 04/29/20 05/01/21  Grayce Sessions, NP  ascorbic acid (VITAMIN C) 500 MG tablet Take 1 tablet (500 mg total) by mouth daily. 06/23/20   Shon Hale, MD  atorvastatin (LIPITOR) 80 MG tablet Take 1 tablet (80 mg total) by mouth at bedtime. Any generic statin from [employer] list is okay 10/14/20   Grayce Sessions, NP  clopidogrel (PLAVIX) 75 MG tablet Take 1 tablet (75 mg total) by mouth daily. 10/11/20   Grayce Sessions, NP  doxycycline (VIBRAMYCIN) 100 MG capsule Take 1 capsule (100 mg total) by mouth 2 (two) times daily. 04/14/21   Jeannie Fend, PA-C  glipiZIDE (GLUCOTROL) 10 MG tablet Take 1 tablet (10 mg total) by mouth 2 (two) times daily before a meal. 10/11/20   Grayce Sessions, NP  glipiZIDE (GLUCOTROL) 10 MG tablet TAKE 1 TABLET (10 MG TOTAL) BY MOUTH 2 (TWO) TIMES DAILY BEFORE A MEAL. 04/29/20 05/01/21  Grayce Sessions, NP  insulin aspart (NOVOLOG) 100 UNIT/ML injection Inject 12 Units into the skin 3 (three) times daily before meals. Take insulin when blood sugars are 201-250 give 4 units, 251-300 give 6 units, 301-350 give 8 units, 351-400 give 10 units,> 400 give 12 units and call M.D. Discussed hypoglycemia protocol. 10/11/20   Grayce Sessions, NP  insulin glargine (LANTUS) 100 UNIT/ML injection Inject 0.3 mLs (30 Units total) into the skin 2 (two) times daily. 10/11/20   Grayce Sessions, NP  INSULIN SYRINGE 1CC/29G (B-D INSULIN SYRINGE) 29G X 1/2" 1 ML MISC 1 application by Does not apply route at bedtime. 06/01/20   Grayce Sessions, NP  losartan (COZAAR) 100 MG tablet TAKE 1 TABLET (100 MG TOTAL) BY MOUTH DAILY. 04/29/20 05/01/21  Grayce Sessions, NP  olmesartan-hydrochlorothiazide (BENICAR HCT) 40-25 MG tablet Take 1 tablet by mouth daily. 10/11/20   Grayce Sessions, NP  pantoprazole  (PROTONIX) 40 MG tablet Take 1 tablet (40 mg total) by mouth daily. 06/23/20   Shon Hale, MD  sitaGLIPtin (JANUVIA) 100 MG tablet Take 1 tablet (100 mg total) by mouth daily. 10/11/20   Grayce Sessions, NP  sitaGLIPtin (JANUVIA) 100 MG tablet TAKE 1 TABLET (100 MG TOTAL) BY MOUTH DAILY. 04/29/20 05/01/21  Grayce Sessions, NP  zinc sulfate 220 (50 Zn) MG capsule Take 1 capsule (220 mg total) by mouth daily. 06/23/20   Shon Hale, MD    Allergies    Ace inhibitors  Review of Systems   Review of Systems  Constitutional: Negative for chills and fever.  HENT: Negative for congestion.   Respiratory: Negative for shortness of breath.   Cardiovascular: Negative for chest pain.  Gastrointestinal: Negative for abdominal pain.  Genitourinary: Negative for enuresis.  Musculoskeletal: Negative for back pain.  Skin: Positive for  wound. Negative for rash.       Wound on right labia  Neurological: Negative for dizziness.  Hematological: Does not bruise/bleed easily.    Physical Exam Updated Vital Signs BP (!) 177/96 (BP Location: Right Arm)   Pulse 96   Temp 98.2 F (36.8 C) (Oral)   Resp 18   Ht 5\' 2"  (1.575 m)   Wt 65.8 kg   SpO2 99%   BMI 26.52 kg/m   Physical Exam Vitals and nursing note reviewed. Exam conducted with a chaperone present.  Constitutional:      General: She is not in acute distress.    Appearance: Normal appearance. She is not ill-appearing or diaphoretic.  HENT:     Head: Normocephalic and atraumatic.     Nose: No congestion or rhinorrhea.  Eyes:     Conjunctiva/sclera: Conjunctivae normal.  Cardiovascular:     Rate and Rhythm: Normal rate and regular rhythm.  Pulmonary:     Effort: Pulmonary effort is normal.  Genitourinary:    Comments: With chaperone present right labia was visualized there there was packing exposed from a small 2 mm incision, there is no surrounding edema or erythema, no noted discharge or drainage.  Area was nontender to  palpation, not warm to the touch, induration is present no fluctuance noted. Musculoskeletal:     Cervical back: Neck supple.     Right lower leg: No edema.     Left lower leg: No edema.  Skin:    General: Skin is warm and dry.     Coloration: Skin is not jaundiced or pale.  Neurological:     Mental Status: She is alert and oriented to person, place, and time.  Psychiatric:        Mood and Affect: Mood normal.     ED Results / Procedures / Treatments   Labs (all labs ordered are listed, but only abnormal results are displayed) Labs Reviewed - No data to display  EKG None  Radiology No results found.  Procedures Procedures   Medications Ordered in ED Medications - No data to display  ED Course  I have reviewed the triage vital signs and the nursing notes.  Pertinent labs & imaging results that were available during my care of the patient were reviewed by me and considered in my medical decision making (see chart for details).    MDM Rules/Calculators/A&P                         Initial impression-patient presents with wound check.  She is alert, does not appear in acute distress, vital signs reassuring.  Work-up-due to well-appearing patient, benign physical exam, further lab or imaging not warranted at this time.  Rule out-low suspicion for overlying cellulitis as there is no noted erythema surrounding the wound.  Low suspicion for worsening deep tissue infection as area is not warm to the touch, no fluctuance noted, patient states pain  has improved.  Low suspicion for systemic infection as patient is nontoxic-appearing, vital signs reassuring.  Plan-  1.  Right labial abscess improving-recommend continue with the medication as prescribed, warm compresses, following up with PCP in 1 week's time for reevaluation.  Vital signs have remained stable, no indication for hospital admission. Patient given at home care as well strict return precautions.  Patient verbalized  that they understood agreed to said plan.   Final Clinical Impression(s) / ED Diagnoses Final diagnoses:  Wound check, abscess  Rx / DC Orders ED Discharge Orders    None       Barnie DelFaulkner, Vidur Knust J, PA-C 04/16/21 1439    Bethann BerkshireZammit, Joseph, MD 04/16/21 1453

## 2021-04-16 NOTE — ED Triage Notes (Signed)
Packing removal right groin area

## 2021-04-18 ENCOUNTER — Telehealth: Payer: Self-pay

## 2021-04-18 NOTE — Telephone Encounter (Signed)
ILR has RRT 04/15/21. Discussed with patient options to leave device in or explant. Patient states she is not sure at this time but will call back if she decides to explant. Discussed what to do about sending box back, return kit ordered in [facility].

## 2021-05-25 ENCOUNTER — Other Ambulatory Visit: Payer: Self-pay

## 2021-05-25 ENCOUNTER — Encounter (INDEPENDENT_AMBULATORY_CARE_PROVIDER_SITE_OTHER): Payer: Self-pay | Admitting: Primary Care

## 2021-05-25 ENCOUNTER — Ambulatory Visit (INDEPENDENT_AMBULATORY_CARE_PROVIDER_SITE_OTHER): Payer: Self-pay | Admitting: Primary Care

## 2021-05-25 VITALS — BP 146/97 | HR 104 | Temp 97.3°F | Resp 16 | Ht 62.0 in | Wt 147.0 lb

## 2021-05-25 DIAGNOSIS — Z720 Tobacco use: Secondary | ICD-10-CM

## 2021-05-25 DIAGNOSIS — E1159 Type 2 diabetes mellitus with other circulatory complications: Secondary | ICD-10-CM

## 2021-05-25 DIAGNOSIS — I10 Essential (primary) hypertension: Secondary | ICD-10-CM

## 2021-05-25 DIAGNOSIS — Z794 Long term (current) use of insulin: Secondary | ICD-10-CM

## 2021-05-25 DIAGNOSIS — Z6826 Body mass index (BMI) 26.0-26.9, adult: Secondary | ICD-10-CM

## 2021-05-25 DIAGNOSIS — E782 Mixed hyperlipidemia: Secondary | ICD-10-CM

## 2021-05-25 LAB — POCT GLYCOSYLATED HEMOGLOBIN (HGB A1C): HbA1c, POC (controlled diabetic range): 12.5 % — AB (ref 0.0–7.0)

## 2021-05-25 LAB — GLUCOSE, POCT (MANUAL RESULT ENTRY): POC Glucose: 407 mg/dl — AB (ref 70–99)

## 2021-05-25 MED ORDER — INSULIN REGULAR HUMAN 100 UNIT/ML IJ SOLN
12.0000 [IU] | Freq: Three times a day (TID) | INTRAMUSCULAR | 11 refills | Status: DC
  Filled 2021-05-25: qty 10, 28d supply, fill #0
  Filled 2021-08-01: qty 10, 28d supply, fill #1
  Filled 2021-09-29: qty 10, 28d supply, fill #2

## 2021-05-25 MED ORDER — AMLODIPINE BESYLATE 10 MG PO TABS
ORAL_TABLET | Freq: Every day | ORAL | 3 refills | Status: DC
  Filled 2021-05-25: qty 30, 30d supply, fill #0
  Filled 2021-08-01: qty 30, 30d supply, fill #1
  Filled 2021-09-29: qty 30, 30d supply, fill #2
  Filled 2021-11-28 – 2021-12-06 (×2): qty 30, 30d supply, fill #3
  Filled 2022-01-07: qty 30, 30d supply, fill #4
  Filled 2022-01-09: qty 30, 30d supply, fill #0
  Filled 2022-03-02: qty 30, 30d supply, fill #1
  Filled 2022-04-25 – 2022-05-05 (×2): qty 30, 30d supply, fill #2

## 2021-05-25 MED ORDER — GLIPIZIDE 10 MG PO TABS
ORAL_TABLET | Freq: Two times a day (BID) | ORAL | 3 refills | Status: DC
  Filled 2021-05-25: qty 60, 30d supply, fill #0
  Filled 2021-08-01: qty 60, 30d supply, fill #1
  Filled 2021-09-29: qty 60, 30d supply, fill #2
  Filled 2021-11-28 – 2021-12-06 (×2): qty 60, 30d supply, fill #3
  Filled 2022-01-07: qty 60, 30d supply, fill #4
  Filled 2022-01-09: qty 60, 30d supply, fill #0
  Filled 2022-03-02: qty 60, 30d supply, fill #1
  Filled 2022-04-25 – 2022-05-05 (×2): qty 60, 30d supply, fill #2

## 2021-05-25 MED ORDER — INSULIN GLARGINE 100 UNIT/ML ~~LOC~~ SOLN
30.0000 [IU] | Freq: Two times a day (BID) | SUBCUTANEOUS | 3 refills | Status: DC
  Filled 2021-05-25: qty 20, 33d supply, fill #0
  Filled 2021-08-01: qty 20, 33d supply, fill #1

## 2021-05-25 MED ORDER — ATORVASTATIN CALCIUM 80 MG PO TABS
80.0000 mg | ORAL_TABLET | Freq: Every day | ORAL | 1 refills | Status: DC
  Filled 2021-05-25: qty 30, 30d supply, fill #0
  Filled 2021-08-01: qty 30, 30d supply, fill #1
  Filled 2021-09-29: qty 30, 30d supply, fill #2
  Filled 2021-11-28 – 2021-12-06 (×2): qty 30, 30d supply, fill #3

## 2021-05-25 MED ORDER — LOSARTAN POTASSIUM 100 MG PO TABS
ORAL_TABLET | Freq: Every day | ORAL | 3 refills | Status: DC
  Filled 2021-05-25: qty 30, 30d supply, fill #0
  Filled 2021-08-01: qty 30, 30d supply, fill #1
  Filled 2021-09-29: qty 30, 30d supply, fill #2
  Filled 2021-11-28 – 2021-12-06 (×2): qty 30, 30d supply, fill #3
  Filled 2022-01-07: qty 30, 30d supply, fill #4
  Filled 2022-01-09: qty 30, 30d supply, fill #0
  Filled 2022-03-02: qty 30, 30d supply, fill #1
  Filled 2022-04-25 – 2022-05-05 (×2): qty 30, 30d supply, fill #2

## 2021-05-25 NOTE — Patient Instructions (Signed)
Discussed  co- morbidities with uncontrol diabetes  Complications -diabetic retinopathy, (close your eyes ? What do you see nothing) nephropathy decrease in kidney function- can lead to dialysis-on a machine 3 days a week to filter your kidney, neuropathy- numbness and tinging in your hands and feet,  increase risk of heart attack and stroke, and amputation due to decrease wound healing and circulation. Decrease your risk by taking medication daily as prescribed, monitor carbohydrates- foods that are high in carbohydrates are the following rice, potatoes, breads, sugars, and pastas.  Reduction in the intake (eating) will assist in lowering your blood sugars. Exercise daily at least 30 minutes daily.  

## 2021-05-25 NOTE — Progress Notes (Signed)
[facility]     Tammy Bauer is a 55 y.o. female who presents for an follow up evaluation of Type 2 diabetes mellitus.  Current symptoms/problems include polyuria and have been worsening. Symptoms have been present for 3 months.  Current diabetic medications include oral agents (dual therapy): Glipizide 10mg  bid , januvia 100mg  daily  and insulin injections: Lantus : 30 units bid and SSI  .   The patient was initially diagnosed with Type 2 diabetes mellitus based on the following criteria:  ADA guidelines .  Current monitoring regimen: none Home blood sugar records:  no Any episodes of hypoglycemia? no  Known diabetic complications: cardiovascular disease Cardiovascular risk factors: diabetes mellitus, dyslipidemia, hypertension, and smoking/ tobacco exposure Eye exam current (within one year): no Weight trend: stable Prior visit with CDE: yes -  Current diet: in general, an "unhealthy" diet Current exercise: none Medication Compliance?  No (out of medications)   Is She on ACE inhibitor or angiotensin II receptor blocker?  No   losartan (Cozaar)   Review of Systems  Genitourinary:  Positive for urgency.  All other systems reviewed and are negative.  Objective:    BP (!) 146/97   Pulse (!) 104   Temp (!) 97.3 F (36.3 C)   Resp 16   SpO2 97%   Physical Exam Vitals reviewed.  Constitutional:      Appearance: Normal appearance.  HENT:     Head: Normocephalic.     Right Ear: Tympanic membrane and external ear normal.     Left Ear: Tympanic membrane and external ear normal.     Nose: Nose normal.  Cardiovascular:     Rate and Rhythm: Normal rate and regular rhythm.  Pulmonary:     Effort: Pulmonary effort is normal.     Breath sounds: Normal breath sounds.  Musculoskeletal:        General: Normal range of motion.     Cervical back: Normal range of motion.  Skin:    General: Skin is warm and dry.  Neurological:     Mental Status: She is alert and  oriented to person, place, and time.     Lab Review Glucose (mg/dL)  Date Value  259 (H)   Glucose, Bld (mg/dL)  Date Value  180 (H)  06/21/2020 351 (H)  06/20/2020 303 (H)   CO2 (mmol/L)  Date Value  10/11/2020 19 (L)  06/22/2020 21 (L)  06/21/2020 22   BUN (mg/dL)  Date Value  06/24/2020 9  06/22/2020 27 (H)  06/21/2020 23 (H)  06/20/2020 17  10/02/2019 13  02/20/2019 8   Creatinine, Ser (mg/dL)  Date Value  10/04/2019 0.70  06/22/2020 0.73  06/21/2020 0.93      Assessment:    Diabetes Mellitus type II, under poor control.   Tammy Bauer was seen today for diabetes.  Diagnoses and all orders for this visit:  Type 2 diabetes mellitus with other circulatory complication, with long-term current use of insulin (HCC) -     Glucose (CBG) -     HgB A1c Azaria was seen today for diabetes.  Diagnoses and all orders for this visit:  BMI 26.0-26.9,adult Normal weight   Type 2 diabetes mellitus with other circulatory complication, with long-term current use of insulin (HCC) -     Glucose (CBG) -     HgB A1c -     glipiZIDE (GLUCOTROL) 10 MG tablet; TAKE 1 TABLET (10 MG TOTAL) BY MOUTH 2 (TWO) TIMES DAILY BEFORE  A MEAL. -     insulin glargine (LANTUS) 100 UNIT/ML injection; Inject 0.3 mLs (30 Units total) into the skin 2 (two) times daily. -     insulin regular (HUMULIN R) 100 units/mL injection; Inject 0.12 mLs (12 Units total) into the skin 3 (three) times daily before meals. Take insulin when blood sugars are 201-250 give 4 units, 251-300 give 6 units, 301-350 give 8 units, 351-400 give 10 units,> 400 give 12 units and call M.D. Discussed hypoglycemia protocol.  Mixed hyperlipidemia Elevated cholesterol this can lead to heart attack and stroke. To lower your number you can decrease your fatty foods, red meat, cheese, milk and increase fiber like whole grains and veggies.  -     atorvastatin (LIPITOR) 80 MG tablet; Take 1 tablet (80 mg total) by  mouth at bedtime.  Essential hypertension -     amLODipine (NORVASC) 10 MG tablet; TAKE 1 TABLET (10 MG TOTAL) BY MOUTH DAILY. -     losartan (COZAAR) 100 MG tablet; TAKE 1 TABLET (100 MG TOTAL) BY MOUTH DAILY.  Tobacco abuse - I have recommended complete cessation of tobacco use. I have discussed various options available for assistance with tobacco cessation including over the counter methods (Nicotine gum, patch and lozenges). We also discussed prescription options (Chantix, Nicotine Inhaler / Nasal Spray). The patient is not interested in pursuing any prescription tobacco cessation options at this time. - Patient declines at this time.  - Less than 5 minutes spent on counseling.   Other orders -     sitaGLIPtin (JANUVIA) 100 MG tablet; TAKE 1 TABLET (100 MG TOTAL) BY MOUTH DAILY.       Plan:    1.  Rx changes: none  2.  Education: Reviewed 'ABCs' of diabetes management (respective goals in parentheses):  A1C (<7), blood pressure (<130/80), and cholesterol (LDL <100). Counseled Patient on the risk factors of co- morbidities with uncontrol diabetes  Complications -diabetic retinopathy, (close your eyes ? What do you see nothing) nephropathy decrease in kidney function- can lead to dialysis-on a machine 3 days a week to filter your kidney, neuropathy- numbness and tinging in your hands and feet,  increase risk of heart attack and stroke, and amputation due to decrease wound healing and circulation. Decrease your risk by taking medication daily as prescribed, monitor carbohydrates- foods that are high in carbohydrates are the following rice, potatoes, breads, sugars, and pastas.  Reduction in the intake (eating) will assist in lowering your blood sugars. Exercise daily at least 30 minutes daily.   3. CHO counting diet discussed.  Reviewed CHO amount in various foods and how to read nutrition labels.  Discussed recommended serving sizes.   4.  Recommend check BG 3  times a day  5.  Recommended increase physical activity - goal is 150 minutes per week    This note has been created with Education officer, environmental. Any transcriptional errors are unintentional.   Grayce Sessions, NP 05/25/2021, 3:16 PM

## 2021-05-25 NOTE — Progress Notes (Signed)
Follow up diabetes  No plavix x 1 month

## 2021-05-30 ENCOUNTER — Encounter (INDEPENDENT_AMBULATORY_CARE_PROVIDER_SITE_OTHER): Payer: Self-pay | Admitting: Primary Care

## 2021-05-30 ENCOUNTER — Other Ambulatory Visit: Payer: Self-pay

## 2021-05-30 ENCOUNTER — Ambulatory Visit: Payer: Self-pay | Admitting: Pharmacist

## 2021-05-30 MED ORDER — SITAGLIPTIN PHOSPHATE 100 MG PO TABS
ORAL_TABLET | Freq: Every day | ORAL | 1 refills | Status: DC
  Filled 2021-05-30: qty 30, 30d supply, fill #0
  Filled 2021-08-01: qty 30, 30d supply, fill #1
  Filled 2021-09-29: qty 30, 30d supply, fill #2
  Filled 2022-01-07: qty 30, 30d supply, fill #3
  Filled 2022-01-09: qty 30, 30d supply, fill #0
  Filled 2022-05-05: qty 30, 30d supply, fill #1

## 2021-05-31 ENCOUNTER — Other Ambulatory Visit: Payer: Self-pay

## 2021-06-01 ENCOUNTER — Telehealth (INDEPENDENT_AMBULATORY_CARE_PROVIDER_SITE_OTHER): Payer: Self-pay

## 2021-06-01 NOTE — Telephone Encounter (Signed)
Copied from CRM [id number]. Topic: General - Other >> May 31, 2021  4:05 PM Glean Salen wrote: Reason for CRM: patient called in says is supposed to have 6 medications, but only had 3 sent in that she is picking up now. She didn't have the name of the medications. Please call back

## 2021-06-02 NOTE — Telephone Encounter (Signed)
Patient stated has all her medications

## 2021-07-04 ENCOUNTER — Other Ambulatory Visit: Payer: Self-pay

## 2021-07-08 ENCOUNTER — Other Ambulatory Visit: Payer: Self-pay

## 2021-08-02 ENCOUNTER — Other Ambulatory Visit: Payer: Self-pay

## 2021-08-25 ENCOUNTER — Ambulatory Visit (INDEPENDENT_AMBULATORY_CARE_PROVIDER_SITE_OTHER): Payer: Self-pay | Admitting: Primary Care

## 2021-08-26 ENCOUNTER — Other Ambulatory Visit: Payer: Self-pay

## 2021-08-26 ENCOUNTER — Emergency Department (HOSPITAL_COMMUNITY)
Admission: EM | Admit: 2021-08-26 | Discharge: 2021-08-26 | Disposition: A | Discharge status: Home / Self Care | Payer: Self-pay | Attending: Emergency Medicine | Admitting: Emergency Medicine

## 2021-08-26 ENCOUNTER — Encounter (HOSPITAL_COMMUNITY): Payer: Self-pay

## 2021-08-26 DIAGNOSIS — M25551 Pain in right hip: Secondary | ICD-10-CM | POA: Insufficient documentation

## 2021-08-26 DIAGNOSIS — Z5321 Procedure and treatment not carried out due to patient leaving prior to being seen by health care provider: Secondary | ICD-10-CM | POA: Insufficient documentation

## 2021-08-26 NOTE — ED Triage Notes (Signed)
Pt arrived via POV c/o boil to right groin area X 3 days. Pt reports no relief with warm compresses and soaks at home. Pt reports this happened before as well  

## 2021-08-27 ENCOUNTER — Encounter (HOSPITAL_COMMUNITY): Payer: Self-pay | Admitting: Emergency Medicine

## 2021-08-27 ENCOUNTER — Emergency Department (HOSPITAL_COMMUNITY)
Admission: EM | Admit: 2021-08-27 | Discharge: 2021-08-27 | Disposition: A | Discharge status: Home / Self Care | Payer: Self-pay | Attending: Emergency Medicine | Admitting: Emergency Medicine

## 2021-08-27 DIAGNOSIS — L0291 Cutaneous abscess, unspecified: Secondary | ICD-10-CM

## 2021-08-27 DIAGNOSIS — L02215 Cutaneous abscess of perineum: Secondary | ICD-10-CM | POA: Insufficient documentation

## 2021-08-27 DIAGNOSIS — I10 Essential (primary) hypertension: Secondary | ICD-10-CM | POA: Insufficient documentation

## 2021-08-27 DIAGNOSIS — Z7902 Long term (current) use of antithrombotics/antiplatelets: Secondary | ICD-10-CM | POA: Insufficient documentation

## 2021-08-27 DIAGNOSIS — Z79899 Other long term (current) drug therapy: Secondary | ICD-10-CM | POA: Insufficient documentation

## 2021-08-27 DIAGNOSIS — E119 Type 2 diabetes mellitus without complications: Secondary | ICD-10-CM | POA: Insufficient documentation

## 2021-08-27 DIAGNOSIS — Z794 Long term (current) use of insulin: Secondary | ICD-10-CM | POA: Insufficient documentation

## 2021-08-27 DIAGNOSIS — Z8616 Personal history of COVID-19: Secondary | ICD-10-CM | POA: Insufficient documentation

## 2021-08-27 DIAGNOSIS — Z7984 Long term (current) use of oral hypoglycemic drugs: Secondary | ICD-10-CM | POA: Insufficient documentation

## 2021-08-27 DIAGNOSIS — Z8673 Personal history of transient ischemic attack (TIA), and cerebral infarction without residual deficits: Secondary | ICD-10-CM | POA: Insufficient documentation

## 2021-08-27 DIAGNOSIS — F17210 Nicotine dependence, cigarettes, uncomplicated: Secondary | ICD-10-CM | POA: Insufficient documentation

## 2021-08-27 MED ORDER — LIDOCAINE HCL (PF) 2 % IJ SOLN
INTRAMUSCULAR | Status: AC
  Administered 2021-08-27: 5 mL
  Filled 2021-08-27: qty 10

## 2021-08-27 MED ORDER — LIDOCAINE HCL (PF) 2 % IJ SOLN
5.0000 mL | Freq: Once | INTRAMUSCULAR | Status: AC
  Administered 2021-08-27: 5 mL

## 2021-08-27 MED ORDER — DOXYCYCLINE HYCLATE 100 MG PO CAPS: 100.0000 mg | ORAL_CAPSULE | Freq: Two times a day (BID) | ORAL | 0 refills | Status: DC

## 2021-08-27 MED ORDER — POVIDONE-IODINE 10 % EX SOLN
CUTANEOUS | Status: DC | PRN
  Filled 2021-08-27: qty 15

## 2021-08-27 NOTE — ED Triage Notes (Signed)
Pt arrived via POV c/o boil to right groin area X 3 days. Pt reports no relief with warm compresses and soaks at home. Pt reports this happened before as well

## 2021-08-27 NOTE — ED Provider Notes (Signed)
[facility] EMERGENCY DEPARTMENT Provider Note   CSN: [id number] Arrival date & time: 08/27/21  0734     History Chief Complaint  Patient presents with   Abscess    Tammy Bauer is a 55 y.o. female with a history including hypertension and diabetes, states her CBG is currently 160 presenting for a recurrence of an abscess in her right groin.  She was seen for an abscess at the same site in May that she needed lancing.  She has been using warm compresses but it has failed to drain spontaneously.  She reports significant pain with palpation.  She denies fevers or chills or any other complaints.  The history is provided by the patient.      Past Medical History:  Diagnosis Date   Anemia    Diabetes mellitus without complication (HCC)    Hypertension    Stroke (HCC)    x2; no deficits   Thyroid nodule 10/02/2017    Patient Active Problem List   Diagnosis Date Noted   Pneumonia due to COVID-19 virus 06/20/2020   Dehydration 06/20/2020   Hyponatremia 06/20/2020   Acute respiratory failure with hypoxia (HCC) 06/20/2020   Diplopia 05/22/2019   Headache 05/22/2019   Hyperlipidemia 05/22/2019   TIA (transient ischemic attack) 05/21/2019   Vaginal bleeding 01/03/2018   Acute blood loss anemia 01/03/2018   Anemia 01/03/2018   Acute ischemic stroke (HCC) 10/02/2017   DM2 (diabetes mellitus, type 2) (HCC) 10/02/2017   Acute CVA (cerebrovascular accident) (HCC) 06/25/2017   Hypertensive emergency 06/25/2017   Tobacco abuse 06/25/2017   Essential hypertension 10/07/2015   Hyperglycemia 10/07/2015    Past Surgical History:  Procedure Laterality Date   ENDOMETRIAL ABLATION N/A 01/30/2018   Procedure: ENDOMETRIAL ABLATION WITH MINERVA;  Surgeon: Lazaro Arms, MD;  Location: [facility];  Service: Gynecology;  Laterality: N/A;   HYSTEROSCOPY WITH D & C N/A 01/30/2018   Procedure: DILATATION AND CURETTAGE /HYSTEROSCOPY;  Surgeon: Lazaro Arms, MD;  Location: [facility];  Service:  Gynecology;  Laterality: N/A;   LOOP RECORDER INSERTION N/A 06/27/2017   Procedure: Loop Recorder Insertion;  Surgeon: Regan Lemming, MD;  Location: [facility];  Service: Cardiovascular;  Laterality: N/A;   NERVE, TENDON AND ARTERY REPAIR Left 10/21/2019   Procedure: WOUND EXPLORATION LEFT HAND WITH REPAIR TENDON, ARTERY AND NERVE ;  Surgeon: Betha Loa, MD;  Location:  SURGERY CENTER;  Service: Orthopedics;  Laterality: Left;   TEE WITHOUT CARDIOVERSION N/A 06/27/2017   Procedure: TRANSESOPHAGEAL ECHOCARDIOGRAM (TEE);  Surgeon: [name], MD;  Location: [facility] END[facility]COPY;  Service: Cardiovascular;  Laterality: N/A;     OB History   No obstetric history on file.     Family History  Problem Relation Age of Onset   Stroke Maternal Grandfather     Social History   Tobacco Use   Smoking status: Every Day    Packs/day: 0.25    Years: 20.00    Pack years: 5.00    Types: Cigarettes    Start date: 08/29/1999   Smokeless tobacco: Never   Tobacco comments:    smoke 5 cigarettes a day  Vaping Use   Vaping Use: Never used  Substance Use Topics   Alcohol use: No   Drug use: No    Home Medications Prior to Admission medications   Medication Sig Start Date End Date Taking? Authorizing Provider  doxycycline (VIBRAMYCIN) 100 MG capsule Take 1 capsule (100 mg total) by mouth 2 (two) times  daily. 08/27/21  Yes Tashauna Caisse, Raynelle Fanning, PA-C  acetaminophen (TYLENOL) 325 MG tablet Take 650 mg by mouth every 6 (six) hours as needed for mild pain or moderate pain.     [provider]  albuterol (VENTOLIN HFA) 108 (90 Base) MCG/ACT inhaler Inhale 2 puffs into the lungs every 6 (six) hours. Patient not taking: Reported on 05/25/2021 06/22/20   Shon Hale, MD  amLODipine (NORVASC) 10 MG tablet TAKE 1 TABLET (10 MG TOTAL) BY MOUTH DAILY. 05/25/21 05/25/22  Grayce Sessions, NP  ascorbic acid (VITAMIN C) 500 MG tablet Take 1 tablet (500 mg total) by mouth daily. Patient  not taking: Reported on 05/25/2021 06/23/20   Shon Hale, MD  atorvastatin (LIPITOR) 80 MG tablet Take 1 tablet (80 mg total) by mouth at bedtime. 05/25/21   Grayce Sessions, NP  clopidogrel (PLAVIX) 75 MG tablet Take 1 tablet (75 mg total) by mouth daily. Patient not taking: Reported on 05/25/2021 10/11/20   Grayce Sessions, NP  glipiZIDE (GLUCOTROL) 10 MG tablet TAKE 1 TABLET (10 MG TOTAL) BY MOUTH 2 (TWO) TIMES DAILY BEFORE A MEAL. 05/25/21 05/25/22  Grayce Sessions, NP  insulin glargine (LANTUS) 100 UNIT/ML injection Inject 0.3 mLs (30 Units total) into the skin 2 (two) times daily. 05/25/21   Grayce Sessions, NP  insulin regular (HUMULIN R) 100 units/mL injection Inject 0.12 mLs (12 Units total) into the skin 3 (three) times daily before meals. Take insulin when blood sugars are 201-250 give 4 units, 251-300 give 6 units, 301-350 give 8 units, 351-400 give 10 units,> 400 give 12 units and call M.D. Discussed hypoglycemia protocol. 05/25/21   Grayce Sessions, NP  INSULIN SYRINGE 1CC/29G (B-D INSULIN SYRINGE) 29G X 1/2" 1 ML MISC 1 application by Does not apply route at bedtime. 06/01/20   Grayce Sessions, NP  losartan (COZAAR) 100 MG tablet TAKE 1 TABLET (100 MG TOTAL) BY MOUTH DAILY. 05/25/21 05/25/22  Grayce Sessions, NP  sitaGLIPtin (JANUVIA) 100 MG tablet TAKE 1 TABLET (100 MG TOTAL) BY MOUTH DAILY. 05/30/21 05/30/22  Grayce Sessions, NP    Allergies    Ace inhibitors  Review of Systems   Review of Systems  Constitutional:  Negative for chills and fever.  HENT: Negative.    Eyes: Negative.   Respiratory: Negative.    Cardiovascular: Negative.   Gastrointestinal: Negative.  Negative for nausea.  Genitourinary: Negative.   Musculoskeletal: Negative.   Skin: Negative.  Negative for rash and wound.       Negative except as mentioned in HPI.    Neurological: Negative.    Physical Exam Updated Vital Signs BP (!) 195/111 (BP Location: Right Arm)   Pulse 84    Temp 99 F (37.2 C) (Oral)   Resp 17   Ht 5\' 2"  (1.575 m)   Wt 65.8 kg   SpO2 98%   BMI 26.52 kg/m   Physical Exam Vitals and nursing note reviewed. Exam conducted with a chaperone present.  Constitutional:      Appearance: She is well-developed.  HENT:     Head: Normocephalic and atraumatic.  Cardiovascular:     Rate and Rhythm: Normal rate.  Pulmonary:     Effort: Pulmonary effort is normal.  Abdominal:     Palpations: Abdomen is soft.     Tenderness: There is no abdominal tenderness.  Genitourinary:    Comments: 3 cm area of induration right mons pubis with a small central area of fluctuance.  No  red streaking, no tenting pustules or papules.   Musculoskeletal:        General: Normal range of motion.  Skin:    General: Skin is warm and dry.  Neurological:     Mental Status: She is alert.    ED Results / Procedures / Treatments   Labs (all labs ordered are listed, but only abnormal results are displayed) Labs Reviewed - No data to display  EKG None  Radiology No results found.  Procedures Procedures   INCISION AND DRAINAGE Performed by: Burgess Amor Consent: Verbal consent obtained. Risks and benefits: risks, benefits and alternatives were discussed Type: abscess  Body area: right mons pubis  Anesthesia: local infiltration  Incision was made with a scalpel.  Local anesthetic: lidocaine 2% without epinephrine  Anesthetic total: 5 ml  Complexity: complex Blunt dissection to break up loculations  Drainage: purulent  Drainage amount: small  Packing material: discussed pro's and con's of packing, pt opted for no packing with plans for frequent warm soaks.    Patient tolerance: Patient tolerated the procedure well with no immediate complications.    Medications Ordered in ED Medications  povidone-iodine (BETADINE) 10 % external solution ( Topical Given 08/27/21 1117)  lidocaine HCl (PF) (XYLOCAINE) 2 % injection 5 mL (5 mLs Other Given 08/27/21  1119)    ED Course  I have reviewed the triage vital signs and the nursing notes.  Pertinent labs & imaging results that were available during my care of the patient were reviewed by me and considered in my medical decision making (see chart for details).    MDM Rules/Calculators/A&P                           Pt placed on doxycycline, home tx discussed,  return precautions discussed.  Advised she may benefit from consideration of surgical excision of site if she continues to have recurrent abscesses here - suggested she talk to her gyn regarding this.  Final Clinical Impression(s) / ED Diagnoses Final diagnoses:  Abscess    Rx / DC Orders ED Discharge Orders          Ordered    doxycycline (VIBRAMYCIN) 100 MG capsule  2 times daily        08/27/21 1201             Burgess Amor, PA-C 08/27/21 1301    Jacalyn Lefevre, MD 08/27/21 1434

## 2021-08-27 NOTE — Discharge Instructions (Addendum)
Continue plenty of warm water soaks as discussed, ideally 3 times daily for 10 minutes with gentle massage around the site while in the water to help facilitate continued healing and any drainage which may try to reaccumulate.  Take the entire course of the antibiotics prescribed.  Return here if you have any worsening symptoms including worse pain, swelling.

## 2021-09-21 ENCOUNTER — Other Ambulatory Visit: Payer: Self-pay

## 2021-09-30 ENCOUNTER — Other Ambulatory Visit: Payer: Self-pay

## 2021-10-03 ENCOUNTER — Other Ambulatory Visit: Payer: Self-pay

## 2021-11-28 ENCOUNTER — Other Ambulatory Visit (INDEPENDENT_AMBULATORY_CARE_PROVIDER_SITE_OTHER): Payer: Self-pay | Admitting: Primary Care

## 2021-11-28 DIAGNOSIS — Z794 Long term (current) use of insulin: Secondary | ICD-10-CM

## 2021-11-28 DIAGNOSIS — Z760 Encounter for issue of repeat prescription: Secondary | ICD-10-CM

## 2021-11-29 ENCOUNTER — Other Ambulatory Visit: Payer: Self-pay

## 2021-11-29 NOTE — Telephone Encounter (Signed)
Sent to PCP ?

## 2021-11-29 NOTE — Telephone Encounter (Signed)
Advise and refill please if appropriate and length of time

## 2021-11-30 ENCOUNTER — Other Ambulatory Visit: Payer: Self-pay

## 2021-12-02 ENCOUNTER — Other Ambulatory Visit: Payer: Self-pay

## 2021-12-02 MED ORDER — CLOPIDOGREL BISULFATE 75 MG PO TABS
75.0000 mg | ORAL_TABLET | Freq: Every day | ORAL | 1 refills | Status: DC
  Filled 2021-12-02: qty 30, 30d supply, fill #0
  Filled 2022-01-07: qty 30, 30d supply, fill #1
  Filled 2022-01-09: qty 30, 30d supply, fill #0
  Filled 2022-03-02: qty 30, 30d supply, fill #1
  Filled 2022-04-25 – 2022-05-05 (×2): qty 30, 30d supply, fill #2
  Filled 2022-07-24: qty 30, 30d supply, fill #3

## 2021-12-02 MED ORDER — INSULIN GLARGINE 100 UNIT/ML ~~LOC~~ SOLN
30.0000 [IU] | Freq: Two times a day (BID) | SUBCUTANEOUS | 3 refills | Status: DC
  Filled 2021-12-02: qty 20, 33d supply, fill #0
  Filled 2022-01-07: qty 20, 33d supply, fill #1
  Filled 2022-01-09: qty 20, 33d supply, fill #0

## 2021-12-05 ENCOUNTER — Other Ambulatory Visit: Payer: Self-pay

## 2021-12-06 ENCOUNTER — Other Ambulatory Visit: Payer: Self-pay

## 2021-12-07 ENCOUNTER — Other Ambulatory Visit: Payer: Self-pay

## 2021-12-08 NOTE — Telephone Encounter (Signed)
This was addressed/refilled by PCP

## 2022-01-09 ENCOUNTER — Other Ambulatory Visit: Payer: Self-pay

## 2022-01-12 ENCOUNTER — Other Ambulatory Visit: Payer: Self-pay

## 2022-01-13 ENCOUNTER — Other Ambulatory Visit: Payer: Self-pay

## 2022-03-02 ENCOUNTER — Other Ambulatory Visit (INDEPENDENT_AMBULATORY_CARE_PROVIDER_SITE_OTHER): Payer: Self-pay | Admitting: Primary Care

## 2022-03-02 ENCOUNTER — Other Ambulatory Visit: Payer: Self-pay

## 2022-03-02 DIAGNOSIS — Z794 Long term (current) use of insulin: Secondary | ICD-10-CM

## 2022-03-02 NOTE — Telephone Encounter (Signed)
Routed to PCP. ?Patient last appointment was 05/2021. ?

## 2022-03-07 ENCOUNTER — Other Ambulatory Visit: Payer: Self-pay | Admitting: Pharmacist

## 2022-03-07 ENCOUNTER — Other Ambulatory Visit: Payer: Self-pay

## 2022-03-07 MED ORDER — INSULIN GLARGINE-YFGN 100 UNIT/ML ~~LOC~~ SOLN
30.0000 [IU] | Freq: Two times a day (BID) | SUBCUTANEOUS | 0 refills | Status: AC
  Filled 2022-03-07: qty 20, 33d supply, fill #0

## 2022-03-07 MED ORDER — INSULIN GLARGINE 100 UNIT/ML ~~LOC~~ SOLN
30.0000 [IU] | Freq: Two times a day (BID) | SUBCUTANEOUS | 0 refills | Status: DC
  Filled 2022-03-07: qty 10, 17d supply, fill #0

## 2022-03-07 NOTE — Telephone Encounter (Signed)
Needs appt

## 2022-03-08 ENCOUNTER — Other Ambulatory Visit: Payer: Self-pay

## 2022-03-09 ENCOUNTER — Other Ambulatory Visit: Payer: Self-pay

## 2022-04-26 ENCOUNTER — Other Ambulatory Visit: Payer: Self-pay

## 2022-05-03 ENCOUNTER — Other Ambulatory Visit: Payer: Self-pay

## 2022-05-05 ENCOUNTER — Other Ambulatory Visit: Payer: Self-pay | Admitting: Family Medicine

## 2022-05-05 ENCOUNTER — Other Ambulatory Visit: Payer: Self-pay

## 2022-05-05 NOTE — Telephone Encounter (Signed)
Requested medication (s) are due for refill today: yes  Requested medication (s) are on the active medication list: yes  Last refill:  03/07/22 #7mL  Future visit scheduled: no  Notes to clinic:  Unable to refill per protocol, medication not assigned to the refill protocol.      Requested Prescriptions  Pending Prescriptions Disp Refills   insulin glargine-yfgn (SEMGLEE, YFGN,) 100 UNIT/ML injection 20 mL 0    Sig: Inject 0.3 mLs (30 Units total) into the skin 2 (two) times daily.     Off-Protocol Failed - 05/05/2022  3:01 PM      Failed - Medication not assigned to a protocol, review manually.      Passed - Valid encounter within last 12 months    Recent Outpatient Visits           11 months ago BMI 26.0-26.9,adult   [facility] Grayce Sessions, NP   1 year ago Colon cancer screening   [facility] Grayce Sessions, NP   1 year ago Type 2 diabetes mellitus with other circulatory complication, with long-term current use of insulin (HCC)   [facility] Grayce Sessions, NP   2 years ago Type 2 diabetes mellitus with other circulatory complication, with long-term current use of insulin (HCC)   [facility] Grayce Sessions, NP   2 years ago Type 2 diabetes mellitus with other circulatory complication, with long-term current use of insulin (HCC)   [facility] Grayce Sessions, NP

## 2022-05-08 ENCOUNTER — Other Ambulatory Visit: Payer: Self-pay

## 2022-07-24 ENCOUNTER — Other Ambulatory Visit: Payer: Self-pay

## 2022-07-24 ENCOUNTER — Other Ambulatory Visit: Payer: Self-pay | Admitting: Family Medicine

## 2022-07-24 ENCOUNTER — Other Ambulatory Visit (INDEPENDENT_AMBULATORY_CARE_PROVIDER_SITE_OTHER): Payer: Self-pay | Admitting: Primary Care

## 2022-07-24 DIAGNOSIS — Z794 Long term (current) use of insulin: Secondary | ICD-10-CM

## 2022-07-24 DIAGNOSIS — I10 Essential (primary) hypertension: Secondary | ICD-10-CM

## 2022-07-25 NOTE — Telephone Encounter (Signed)
Requested medication (s) are due for refill today - yes  Requested medication (s) are on the active medication list -yes  Future visit scheduled -no  Last refill: 03/07/22 26ml  Notes to clinic: off protocol- provider review- last RF has notes  Requested Prescriptions  Pending Prescriptions Disp Refills   insulin glargine-yfgn (SEMGLEE, YFGN,) 100 UNIT/ML injection 20 mL 0    Sig: Inject 0.3 mLs (30 Units total) into the skin 2 (two) times daily.     Off-Protocol Failed - 07/24/2022  8:32 AM      Failed - Medication not assigned to a protocol, review manually.      Failed - Valid encounter within last 12 months    Recent Outpatient Visits           1 year ago BMI 26.0-26.9,adult   [facility] Grayce Sessions, NP   1 year ago Colon cancer screening   [facility] Grayce Sessions, NP   2 years ago Type 2 diabetes mellitus with other circulatory complication, with long-term current use of insulin (HCC)   [facility] Grayce Sessions, NP   2 years ago Type 2 diabetes mellitus with other circulatory complication, with long-term current use of insulin (HCC)   [facility] Grayce Sessions, NP   2 years ago Type 2 diabetes mellitus with other circulatory complication, with long-term current use of insulin (HCC)   [facility] Grayce Sessions, NP                 Requested Prescriptions  Pending Prescriptions Disp Refills   insulin glargine-yfgn (SEMGLEE, YFGN,) 100 UNIT/ML injection 20 mL 0    Sig: Inject 0.3 mLs (30 Units total) into the skin 2 (two) times daily.     Off-Protocol Failed - 07/24/2022  8:32 AM      Failed - Medication not assigned to a protocol, review manually.      Failed - Valid encounter within last 12 months    Recent Outpatient Visits           1 year ago BMI 26.0-26.9,adult   [facility] Grayce Sessions, NP   1 year ago Colon cancer screening   [facility] Grayce Sessions, NP   2 years ago Type 2 diabetes mellitus with other circulatory complication, with long-term current use of insulin (HCC)   [facility] Grayce Sessions, NP   2 years ago Type 2 diabetes mellitus with other circulatory complication, with long-term current use of insulin (HCC)   [facility] Grayce Sessions, NP   2 years ago Type 2 diabetes mellitus with other circulatory complication, with long-term current use of insulin (HCC)   [facility] Grayce Sessions, NP

## 2022-07-31 ENCOUNTER — Other Ambulatory Visit: Payer: Self-pay

## 2022-08-01 ENCOUNTER — Other Ambulatory Visit: Payer: Self-pay

## 2022-12-01 ENCOUNTER — Encounter: Payer: Self-pay | Admitting: Nurse Practitioner

## 2022-12-01 ENCOUNTER — Ambulatory Visit (INDEPENDENT_AMBULATORY_CARE_PROVIDER_SITE_OTHER): Payer: PRIVATE HEALTH INSURANCE | Admitting: Nurse Practitioner

## 2022-12-01 VITALS — BP 195/110 | HR 97 | Temp 98.7°F | Ht 62.0 in | Wt 121.0 lb

## 2022-12-01 DIAGNOSIS — E782 Mixed hyperlipidemia: Secondary | ICD-10-CM | POA: Diagnosis not present

## 2022-12-01 DIAGNOSIS — E1159 Type 2 diabetes mellitus with other circulatory complications: Secondary | ICD-10-CM

## 2022-12-01 DIAGNOSIS — Z760 Encounter for issue of repeat prescription: Secondary | ICD-10-CM | POA: Diagnosis not present

## 2022-12-01 DIAGNOSIS — Z794 Long term (current) use of insulin: Secondary | ICD-10-CM

## 2022-12-01 DIAGNOSIS — I10 Essential (primary) hypertension: Secondary | ICD-10-CM | POA: Diagnosis not present

## 2022-12-01 LAB — BAYER DCA HB A1C WAIVED: HB A1C (BAYER DCA - WAIVED): 12.5 % — ABNORMAL HIGH (ref 4.8–5.6)

## 2022-12-01 MED ORDER — LOSARTAN POTASSIUM 100 MG PO TABS: 100.0000 mg | ORAL_TABLET | Freq: Every day | ORAL | 0 refills | Status: DC

## 2022-12-01 MED ORDER — AMLODIPINE BESYLATE 10 MG PO TABS: ORAL_TABLET | Freq: Every day | ORAL | 3 refills | Status: DC

## 2022-12-01 MED ORDER — CLOPIDOGREL BISULFATE 75 MG PO TABS: 75.0000 mg | ORAL_TABLET | Freq: Every day | ORAL | 1 refills | Status: DC

## 2022-12-01 MED ORDER — ATORVASTATIN CALCIUM 80 MG PO TABS: 80.0000 mg | ORAL_TABLET | Freq: Every day | ORAL | 1 refills | Status: DC

## 2022-12-01 MED ORDER — GLIPIZIDE 10 MG PO TABS: ORAL_TABLET | Freq: Two times a day (BID) | ORAL | 3 refills | Status: DC

## 2022-12-01 NOTE — Progress Notes (Signed)
New Patient Office Visit  Subjective    Patient ID: Tammy Bauer, female    DOB: [birth year]  Age: 56 y.o. MRN: [id number]  CC:  Chief Complaint  Patient presents with   Hypertension   Diabetes    HPI Tammy Bauer presents to establish care with complaines of Diabetes Mellitus Type II, Follow-up: Patient here for follow-up of Type 2 diabetes mellitus.  Current symptoms/problems include none and have been unchanged. Symptoms have been present for 3 years.  Known diabetic complications: none Cardiovascular risk factors: hypertension Current diabetic medications include none.   Eye exam current (within one year): no Weight trend: unknown by patient Prior visit with dietician: no Current diet: in general, a "healthy" diet   Current exercise: none  Current monitoring regimen:  patient did not bring blood glucose reading to clinic today Home blood sugar records:  not recorded, patient checks as needed  Any episodes of hypoglycemia? no  Is She on ACE inhibitor or angiotensin II receptor blocker?  Yes   losartan (Cozaar)    Hypertension: Patient here for follow-up of elevated blood pressure. She is not exercising and is not adherent to low salt diet.  Blood pressure is not well controlled at home. Cardiac symptoms  non  . Patient denies chest pain, chest pressure/discomfort, and fatigue.  Cardiovascular risk factors: dyslipidemia and hypertension. Use of agents associated with hypertension: none. History of target organ damage: none.    Lipid/Cholesterol, Follow-up  Last lipid panel Other pertinent labs  Lab Results  Component Value Date   CHOL 272 (H) 12/01/2022   HDL 45 12/01/2022   LDLCALC 149 (H) 12/01/2022   LDLDIRECT 105.1 (H) 05/22/2019   TRIG 417 (H) 12/01/2022   CHOLHDL 6.0 (H) 12/01/2022   Lab Results  Component Value Date   ALT 12 12/01/2022   AST 10 12/01/2022   PLT 319 12/01/2022   TSH 2.530 05/21/2019     She was last seen for this : patient is new to  practice today. Management since that visit includes : patient is not currently on any medication.  She reports poor compliance with treatment. She is not having side effects.   Symptoms: No chest pain No chest pressure/discomfort  No dyspnea No lower extremity edema  No numbness or tingling of extremity No orthopnea  No palpitations No paroxysmal nocturnal dyspnea  No speech difficulty No syncope   Current diet: in general, a "healthy" diet   Current exercise: none  The ASCVD Risk score (Arnett DK, et al., 2019) failed to calculate for the following reasons:   The patient has a prior MI or stroke diagnosis   Outpatient Encounter Medications as of 12/01/2022  Medication Sig   acetaminophen (TYLENOL) 325 MG tablet Take 650 mg by mouth every 6 (six) hours as needed for mild pain or moderate pain.    insulin glargine-yfgn (SEMGLEE, YFGN,) 100 UNIT/ML injection Inject 0.3 mLs (30 Units total) into the skin 2 (two) times daily.   insulin regular (HUMULIN R) 100 units/mL injection Inject 0.12 mLs (12 Units total) into the skin 3 (three) times daily before meals. Take insulin when blood sugars are 201-250 give 4 units, 251-300 give 6 units, 301-350 give 8 units, 351-400 give 10 units,> 400 give 12 units and call M.D. Discussed hypoglycemia protocol.   INSULIN SYRINGE 1CC/29G (B-D INSULIN SYRINGE) 29G X 1/2" 1 ML MISC 1 application by Does not apply route at bedtime.   losartan (COZAAR) 100 MG tablet Take 1 tablet (100 mg  total) by mouth daily.   [DISCONTINUED] atorvastatin (LIPITOR) 80 MG tablet Take 1 tablet (80 mg total) by mouth at bedtime.   [DISCONTINUED] clopidogrel (PLAVIX) 75 MG tablet Take 1 tablet (75 mg total) by mouth daily.   amLODipine (NORVASC) 10 MG tablet TAKE 1 TABLET (10 MG TOTAL) BY MOUTH DAILY.   atorvastatin (LIPITOR) 80 MG tablet Take 1 tablet (80 mg total) by mouth at bedtime.   clopidogrel (PLAVIX) 75 MG tablet Take 1 tablet (75 mg total) by mouth daily.   glipiZIDE  (GLUCOTROL) 10 MG tablet TAKE 1 TABLET (10 MG TOTAL) BY MOUTH 2 (TWO) TIMES DAILY BEFORE A MEAL.   [DISCONTINUED] albuterol (VENTOLIN HFA) 108 (90 Base) MCG/ACT inhaler Inhale 2 puffs into the lungs every 6 (six) hours. (Patient not taking: Reported on 05/25/2021)   [DISCONTINUED] amLODipine (NORVASC) 10 MG tablet TAKE 1 TABLET (10 MG TOTAL) BY MOUTH DAILY.   [DISCONTINUED] ascorbic acid (VITAMIN C) 500 MG tablet Take 1 tablet (500 mg total) by mouth daily. (Patient not taking: Reported on 05/25/2021)   [DISCONTINUED] doxycycline (VIBRAMYCIN) 100 MG capsule Take 1 capsule (100 mg total) by mouth 2 (two) times daily.   [DISCONTINUED] glipiZIDE (GLUCOTROL) 10 MG tablet TAKE 1 TABLET (10 MG TOTAL) BY MOUTH 2 (TWO) TIMES DAILY BEFORE A MEAL.   [DISCONTINUED] losartan (COZAAR) 100 MG tablet TAKE 1 TABLET (100 MG TOTAL) BY MOUTH DAILY.   [DISCONTINUED] sitaGLIPtin (JANUVIA) 100 MG tablet TAKE 1 TABLET (100 MG TOTAL) BY MOUTH DAILY.   No facility-administered encounter medications on file as of 12/01/2022.    Past Medical History:  Diagnosis Date   Anemia    Diabetes mellitus without complication ([place])    Hypertension    Stroke [facility])    x2; no deficits   Thyroid nodule 10/02/2017    Past Surgical History:  Procedure Laterality Date   ENDOMETRIAL ABLATION N/A 01/30/2018   Procedure: ENDOMETRIAL ABLATION WITH MINERVA;  Surgeon: Florian Buff, MD;  Location: [facility];  Service: Gynecology;  Laterality: N/A;   HYSTEROSCOPY WITH D & C N/A 01/30/2018   Procedure: DILATATION AND CURETTAGE /HYSTEROSCOPY;  Surgeon: Florian Buff, MD;  Location: [facility];  Service: Gynecology;  Laterality: N/A;   LOOP RECORDER INSERTION N/A 06/27/2017   Procedure: Loop Recorder Insertion;  Surgeon: Constance Haw, MD;  Location: [place] [facility];  Service: Cardiovascular;  Laterality: N/A;   NERVE, TENDON AND ARTERY REPAIR Left 10/21/2019   Procedure: WOUND EXPLORATION LEFT HAND WITH REPAIR TENDON, ARTERY AND NERVE ;   Surgeon: Leanora Cover, MD;  Location: [place];  Service: Orthopedics;  Laterality: Left;   TEE WITHOUT CARDIOVERSION N/A 06/27/2017   Procedure: TRANSESOPHAGEAL ECHOCARDIOGRAM (TEE);  Surgeon: Pixie Casino, MD;  Location: [facility];  Service: Cardiovascular;  Laterality: N/A;    Family History  Problem Relation Age of Onset   Stroke Maternal Grandfather     Social History   Socioeconomic History   Marital status: Married    Spouse name: Not on file   Number of children: Not on file   Years of education: Not on file   Highest education level: Not on file  Occupational History   Not on file  Tobacco Use   Smoking status: Every Day    Packs/day: 0.25    Years: 20.00    Total pack years: 5.00    Types: Cigarettes    Start date: 08/29/1999   Smokeless tobacco: Never   Tobacco comments:  smoke 5 cigarettes a day  Vaping Use   Vaping Use: Never used  Substance and Sexual Activity   Alcohol use: No   Drug use: No   Sexual activity: Not Currently    Birth control/protection: None  Other Topics Concern   Not on file  Social History Narrative   Not on file   Social Determinants of Health   Financial Resource Strain: Not on file  Food Insecurity: Not on file  Transportation Needs: Not on file  Physical Activity: Not on file  Stress: Not on file  Social Connections: Not on file  Intimate Partner Violence: Not on file    Review of Systems  Constitutional: Negative.  Negative for chills and fever.  HENT: Negative.    Respiratory: Negative.    Cardiovascular: Negative.   Genitourinary: Negative.   Skin: Negative.  Negative for rash.  All other systems reviewed and are negative.       Objective    BP (!) 195/110   Pulse 97   Temp 98.7 F (37.1 C)   Ht _0  (1.575 m)   Wt 121 lb (54.9 kg)   SpO2 98%   BMI 22.13 kg/m   Physical Exam Vitals and nursing note reviewed.  Constitutional:      Appearance: Normal appearance.  HENT:      Head: Normocephalic.     Right Ear: External ear normal.     Left Ear: External ear normal.     Nose: Nose normal.     Mouth/Throat:     Pharynx: Oropharynx is clear.  Eyes:     Conjunctiva/sclera: Conjunctivae normal.     Pupils: Pupils are equal, round, and reactive to light.  Cardiovascular:     Pulses: Normal pulses.     Heart sounds: Normal heart sounds.  Pulmonary:     Effort: Pulmonary effort is normal.     Breath sounds: Normal breath sounds.  Abdominal:     General: Bowel sounds are normal.  Neurological:     General: No focal deficit present.     Mental Status: She is alert and oriented to person, place, and time.     Last CBC Lab Results  Component Value Date   WBC 6.6 12/01/2022   HGB 18.4 (H) 12/01/2022   HCT 52.9 (H) 12/01/2022   MCV 95 12/01/2022   MCH 33.0 12/01/2022   RDW 12.2 12/01/2022   PLT 319 96/78/9381   Last metabolic panel Lab Results  Component Value Date   GLUCOSE 330 (H) 12/01/2022   NA 138 12/01/2022   K 4.4 12/01/2022   CL 97 12/01/2022   CO2 25 12/01/2022   BUN 11 12/01/2022   CREATININE 0.83 12/01/2022   EGFR 84 12/01/2022   CALCIUM 10.7 (H) 12/01/2022   PHOS 2.9 06/22/2020   PROT 7.6 12/01/2022   ALBUMIN 4.6 12/01/2022   LABGLOB 3.0 12/01/2022   AGRATIO 1.5 12/01/2022   BILITOT 0.3 12/01/2022   ALKPHOS 120 12/01/2022   AST 10 12/01/2022   ALT 12 12/01/2022   ANIONGAP 12 06/22/2020   Last lipids Lab Results  Component Value Date   CHOL 272 (H) 12/01/2022   HDL 45 12/01/2022   LDLCALC 149 (H) 12/01/2022   LDLDIRECT 105.1 (H) 05/22/2019   TRIG 417 (H) 12/01/2022   CHOLHDL 6.0 (H) 12/01/2022   Last hemoglobin A1c Lab Results  Component Value Date   HGBA1C 12.5 (H) 12/01/2022   Last thyroid functions Lab Results  Component Value Date  TSH 2.530 05/21/2019   Last vitamin D No results found for: "25OHVITD2", "25OHVITD3", "VD25OH"      Assessment & Plan:   Problem List Items Addressed This Visit        Cardiovascular and Mediastinum   Essential hypertension - Primary (Chronic)    Patient is establishing care today and she is not on any medication for the past few months after she ran out of refills. Blood pressure is not well controlled, refilled patients medication, advised patient to keep a daily blood pressure log  and follow up in 2 weeks. Low sodium diet recommended      Relevant Medications   amLODipine (NORVASC) 10 MG tablet   atorvastatin (LIPITOR) 80 MG tablet   losartan (COZAAR) 100 MG tablet   Other Relevant Orders   Bayer DCA Hb A1c Waived (Completed)   CBC with Differential/Platelet (Completed)   CMP14+EGFR (Completed)     Endocrine   DM2 (diabetes mellitus, type 2) (HCC) (Chronic)    Patient's diabetes is not well controlled, patient establishing care today and has not been on any medication for the past few months. Refilled medication, education provided to patient to keep blood glucose log, and follow up in 3 months to follow up chronic disease management.      Relevant Medications   atorvastatin (LIPITOR) 80 MG tablet   glipiZIDE (GLUCOTROL) 10 MG tablet   losartan (COZAAR) 100 MG tablet   Other Relevant Orders   Microalbumin / creatinine urine ratio (Completed)     Other   Hyperlipidemia    Labs completed results pending. Low cholesterol diet recommended, with smoking cessation      Relevant Medications   amLODipine (NORVASC) 10 MG tablet   atorvastatin (LIPITOR) 80 MG tablet   losartan (COZAAR) 100 MG tablet   Other Relevant Orders   Lipid panel (Completed)   Other Visit Diagnoses     Medication refill       Relevant Medications   clopidogrel (PLAVIX) 75 MG tablet       Return in about 3 months (around 03/02/2023) for chronic disease management.   Ivy Lynn, NP

## 2022-12-01 NOTE — Patient Instructions (Signed)
Hypertension, Adult Hypertension is another name for high blood pressure. High blood pressure forces your heart to work harder to pump blood. This can cause problems over time. There are two numbers in a blood pressure reading. There is a top number (systolic) over a bottom number (diastolic). It is best to have a blood pressure that is below 120/80. What are the causes? The cause of this condition is not known. Some other conditions can lead to high blood pressure. What increases the risk? Some lifestyle factors can make you more likely to develop high blood pressure: Smoking. Not getting enough exercise or physical activity. Being overweight. Having too much fat, sugar, calories, or salt (sodium) in your diet. Drinking too much alcohol. Other risk factors include: Having any of these conditions: Heart disease. Diabetes. High cholesterol. Kidney disease. Obstructive sleep apnea. Having a family history of high blood pressure and high cholesterol. Age. The risk increases with age. Stress. What are the signs or symptoms? High blood pressure may not cause symptoms. Very high blood pressure (hypertensive crisis) may cause: Headache. Fast or uneven heartbeats (palpitations). Shortness of breath. Nosebleed. Vomiting or feeling like you may vomit (nauseous). Changes in how you see. Very bad chest pain. Feeling dizzy. Seizures. How is this treated? This condition is treated by making healthy lifestyle changes, such as: Eating healthy foods. Exercising more. Drinking less alcohol. Your doctor may prescribe medicine if lifestyle changes do not help enough and if: Your top number is above 130. Your bottom number is above 80. Your personal target blood pressure may vary. Follow these instructions at home: Eating and drinking  If told, follow the DASH eating plan. To follow this plan: Fill one half of your plate at each meal with fruits and vegetables. Fill one fourth of your plate  at each meal with whole grains. Whole grains include whole-wheat pasta, brown rice, and whole-grain bread. Eat or drink low-fat dairy products, such as skim milk or low-fat yogurt. Fill one fourth of your plate at each meal with low-fat (lean) proteins. Low-fat proteins include fish, chicken without skin, eggs, beans, and tofu. Avoid fatty meat, cured and processed meat, or chicken with skin. Avoid pre-made or processed food. Limit the amount of salt in your diet to less than 1,500 mg each day. Do not drink alcohol if: Your doctor tells you not to drink. You are pregnant, may be pregnant, or are planning to become pregnant. If you drink alcohol: Limit how much you have to: 0-1 drink a day for women. 0-2 drinks a day for men. Know how much alcohol is in your drink. In the [place] one drink equals one 12 oz bottle of beer (355 mL), one 5 oz glass of wine (148 mL), or one 1 oz glass of hard liquor (44 mL). Lifestyle  Work with your doctor to stay at a healthy weight or to lose weight. Ask your doctor what the best weight is for you. Get at least 30 minutes of exercise that causes your heart to beat faster (aerobic exercise) most days of the week. This may include walking, swimming, or biking. Get at least 30 minutes of exercise that strengthens your muscles (resistance exercise) at least 3 days a week. This may include lifting weights or doing Pilates. Do not smoke or use any products that contain nicotine or tobacco. If you need help quitting, ask your doctor. Check your blood pressure at home as told by your doctor. Keep all follow-up visits. Medicines Take over-the-counter and prescription medicines   only as told by your doctor. Follow directions carefully. Do not skip doses of blood pressure medicine. The medicine does not work as well if you skip doses. Skipping doses also puts you at risk for problems. Ask your doctor about side effects or reactions to medicines that you should watch  for. Contact a doctor if: You think you are having a reaction to the medicine you are taking. You have headaches that keep coming back. You feel dizzy. You have swelling in your ankles. You have trouble with your vision. Get help right away if: You get a very bad headache. You start to feel mixed up (confused). You feel weak or numb. You feel faint. You have very bad pain in your: Chest. Belly (abdomen). You vomit more than once. You have trouble breathing. These symptoms may be an emergency. Get help right away. Call 911. Do not wait to see if the symptoms will go away. Do not drive yourself to the hospital. Summary Hypertension is another name for high blood pressure. High blood pressure forces your heart to work harder to pump blood. For most people, a normal blood pressure is less than 120/80. Making healthy choices can help lower blood pressure. If your blood pressure does not get lower with healthy choices, you may need to take medicine. This information is not intended to replace advice given to you by your health care provider. Make sure you discuss any questions you have with your health care provider. Document Revised: 09/08/2021 Document Reviewed: 09/08/2021 Elsevier Patient Education  2023 Elsevier Inc. Diabetes Mellitus Basics  Diabetes mellitus, or diabetes, is a long-term (chronic) disease. It occurs when the body does not properly use sugar (glucose) that is released from food after you eat. Diabetes mellitus may be caused by one or both of these problems: Your pancreas does not make enough of a hormone called insulin. Your body does not react in a normal way to the insulin that it makes. Insulin lets glucose enter cells in your body. This gives you energy. If you have diabetes, glucose cannot get into cells. This causes high blood glucose (hyperglycemia). How to treat and manage diabetes You may need to take insulin or other diabetes medicines daily to keep your  glucose in balance. If you are prescribed insulin, you will learn how to give yourself insulin by injection. You may need to adjust the amount of insulin you take based on the foods that you eat. You will need to check your blood glucose levels using a glucose monitor as told by your health care provider. The readings can help determine if you have low or high blood glucose. Generally, you should have these blood glucose levels: Before meals (preprandial): 80-130 mg/dL (4.4-7.2 mmol/L). After meals (postprandial): below 180 mg/dL (10 mmol/L). Hemoglobin A1c (HbA1c) level: less than 7%. Your health care provider will set treatment goals for you. Keep all follow-up visits. This is important. Follow these instructions at home: Diabetes medicines Take your diabetes medicines every day as told by your health care provider. List your diabetes medicines here: Name of medicine: ______________________________ Amount (dose): _______________ Time (a.m./p.m.): _______________ Notes: ___________________________________ Name of medicine: ______________________________ Amount (dose): _______________ Time (a.m./p.m.): _______________ Notes: ___________________________________ Name of medicine: ______________________________ Amount (dose): _______________ Time (a.m./p.m.): _______________ Notes: ___________________________________ Insulin If you use insulin, list the types of insulin you use here: Insulin type: ______________________________ Amount (dose): _______________ Time (a.m./p.m.): _______________Notes: ___________________________________ Insulin type: ______________________________ Amount (dose): _______________ Time (a.m./p.m.): _______________ Notes: ___________________________________ Insulin type: ______________________________ Amount (dose):   _______________ Time (a.m./p.m.): _______________ Notes: ___________________________________ Insulin type: ______________________________ Amount (dose):  _______________ Time (a.m./p.m.): _______________ Notes: ___________________________________ Insulin type: ______________________________ Amount (dose): _______________ Time (a.m./p.m.): _______________ Notes: ___________________________________ Managing blood glucose  Check your blood glucose levels using a glucose monitor as told by your health care provider. Write down the times that you check your glucose levels here: Time: _______________ Notes: ___________________________________ Time: _______________ Notes: ___________________________________ Time: _______________ Notes: ___________________________________ Time: _______________ Notes: ___________________________________ Time: _______________ Notes: ___________________________________ Time: _______________ Notes: ___________________________________  Low blood glucose Low blood glucose (hypoglycemia) is when glucose is at or below 70 mg/dL (3.9 mmol/L). Symptoms may include: Feeling: Hungry. Sweaty and clammy. Irritable or easily upset. Dizzy. Sleepy. Having: A fast heartbeat. A headache. A change in your vision. Numbness around the mouth, lips, or tongue. Having trouble with: Moving (coordination). Sleeping. Treating low blood glucose To treat low blood glucose, eat or drink something containing sugar right away. If you can think clearly and swallow safely, follow the 15:15 rule: Take 15 grams of a fast-acting carb (carbohydrate), as told by your health care provider. Some fast-acting carbs are: Glucose tablets: take 3-4 tablets. Hard candy: eat 3-5 pieces. Fruit juice: drink 4 oz (120 mL). Regular (not diet) soda: drink 4-6 oz (120-180 mL). Honey or sugar: eat 1 Tbsp (15 mL). Check your blood glucose levels 15 minutes after you take the carb. If your glucose is still at or below 70 mg/dL (3.9 mmol/L), take 15 grams of a carb again. If your glucose does not go above 70 mg/dL (3.9 mmol/L) after 3 tries, get help right  away. After your glucose goes back to normal, eat a meal or a snack within 1 hour. Treating very low blood glucose If your glucose is at or below 54 mg/dL (3 mmol/L), you have very low blood glucose (severe hypoglycemia). This is an emergency. Do not wait to see if the symptoms will go away. Get medical help right away. Call your local emergency services (911 in the [place] Do not drive yourself to the hospital. Questions to ask your health care provider Should I talk with a diabetes educator? What equipment will I need to care for myself at home? What diabetes medicines do I need? When should I take them? How often do I need to check my blood glucose levels? What number can I call if I have questions? When is my follow-up visit? Where can I find a support group for people with diabetes? Where to find more information American Diabetes Association: www.diabetes.org Association of Diabetes Care and Education Specialists: www.diabeteseducator.org Contact a health care provider if: Your blood glucose is at or above 240 mg/dL (13.3 mmol/L) for 2 days in a row. You have been sick or have had a fever for 2 days or more, and you are not getting better. You have any of these problems for more than 6 hours: You cannot eat or drink. You feel nauseous. You vomit. You have diarrhea. Get help right away if: Your blood glucose is lower than 54 mg/dL (3 mmol/L). You get confused. You have trouble thinking clearly. You have trouble breathing. These symptoms may represent a serious problem that is an emergency. Do not wait to see if the symptoms will go away. Get medical help right away. Call your local emergency services (911 in the [place] Do not drive yourself to the hospital. Summary Diabetes mellitus is a chronic disease that occurs when the body does not properly use sugar (glucose) that is released from food after you   eat. Take insulin and diabetes medicines as told. Check your blood glucose  every day, as often as told. Keep all follow-up visits. This is important. This information is not intended to replace advice given to you by your health care provider. Make sure you discuss any questions you have with your health care provider. Document Revised: 03/23/2020 Document Reviewed: 03/23/2020 Elsevier Patient Education  2023 Elsevier Inc.  

## 2022-12-02 LAB — CMP14+EGFR
ALT: 12 IU/L (ref 0–32)
AST: 10 IU/L (ref 0–40)
Albumin/Globulin Ratio: 1.5 (ref 1.2–2.2)
Albumin: 4.6 g/dL (ref 3.8–4.9)
Alkaline Phosphatase: 120 IU/L (ref 44–121)
BUN/Creatinine Ratio: 13 (ref 9–23)
BUN: 11 mg/dL (ref 6–24)
Bilirubin Total: 0.3 mg/dL (ref 0.0–1.2)
CO2: 25 mmol/L (ref 20–29)
Calcium: 10.7 mg/dL — ABNORMAL HIGH (ref 8.7–10.2)
Chloride: 97 mmol/L (ref 96–106)
Creatinine, Ser: 0.83 mg/dL (ref 0.57–1.00)
Globulin, Total: 3 g/dL (ref 1.5–4.5)
Glucose: 330 mg/dL — ABNORMAL HIGH (ref 70–99)
Potassium: 4.4 mmol/L (ref 3.5–5.2)
Sodium: 138 mmol/L (ref 134–144)
Total Protein: 7.6 g/dL (ref 6.0–8.5)
eGFR: 84 mL/min/{1.73_m2} (ref >59)

## 2022-12-02 LAB — CBC WITH DIFFERENTIAL/PLATELET
Basophils Absolute: 0 10*3/uL (ref 0.0–0.2)
Basos: 1 %
EOS (ABSOLUTE): 0 10*3/uL (ref 0.0–0.4)
Eos: 1 %
Hematocrit: 52.9 % — ABNORMAL HIGH (ref 34.0–46.6)
Hemoglobin: 18.4 g/dL — ABNORMAL HIGH (ref 11.1–15.9)
Immature Grans (Abs): 0 10*3/uL (ref 0.0–0.1)
Immature Granulocytes: 0 %
Lymphocytes Absolute: 2.8 10*3/uL (ref 0.7–3.1)
Lymphs: 41 %
MCH: 33 pg (ref 26.6–33.0)
MCHC: 34.8 g/dL (ref 31.5–35.7)
MCV: 95 fL (ref 79–97)
Monocytes Absolute: 0.5 10*3/uL (ref 0.1–0.9)
Monocytes: 7 %
Neutrophils Absolute: 3.4 10*3/uL (ref 1.4–7.0)
Neutrophils: 50 %
Platelets: 319 10*3/uL (ref 150–450)
RBC: 5.57 x10E6/uL — ABNORMAL HIGH (ref 3.77–5.28)
RDW: 12.2 % (ref 11.7–15.4)
WBC: 6.6 10*3/uL (ref 3.4–10.8)

## 2022-12-02 LAB — LIPID PANEL
Chol/HDL Ratio: 6 ratio — ABNORMAL HIGH (ref 0.0–4.4)
Cholesterol, Total: 272 mg/dL — ABNORMAL HIGH (ref 100–199)
HDL: 45 mg/dL (ref >39)
LDL Chol Calc (NIH): 149 mg/dL — ABNORMAL HIGH (ref 0–99)
Triglycerides: 417 mg/dL — ABNORMAL HIGH (ref 0–149)
VLDL Cholesterol Cal: 78 mg/dL — ABNORMAL HIGH (ref 5–40)

## 2022-12-02 LAB — MICROALBUMIN / CREATININE URINE RATIO
Creatinine, Urine: 65.3 mg/dL
Microalb/Creat Ratio: 96 mg/g creat — ABNORMAL HIGH (ref 0–29)
Microalbumin, Urine: 62.9 ug/mL

## 2022-12-02 NOTE — Assessment & Plan Note (Signed)
Patient is establishing care today and she is not on any medication for the past few months after she ran out of refills. Blood pressure is not well controlled, refilled patients medication, advised patient to keep a daily blood pressure log  and follow up in 2 weeks. Low sodium diet recommended

## 2022-12-02 NOTE — Assessment & Plan Note (Signed)
Labs completed results pending. Low cholesterol diet recommended, with smoking cessation

## 2022-12-02 NOTE — Assessment & Plan Note (Signed)
Patient's diabetes is not well controlled, patient establishing care today and has not been on any medication for the past few months. Refilled medication, education provided to patient to keep blood glucose log, and follow up in 3 months to follow up chronic disease management.

## 2022-12-07 ENCOUNTER — Telehealth: Payer: Self-pay | Admitting: Family Medicine

## 2022-12-07 NOTE — Telephone Encounter (Signed)
Please call patient with lab results

## 2022-12-08 ENCOUNTER — Other Ambulatory Visit (INDEPENDENT_AMBULATORY_CARE_PROVIDER_SITE_OTHER): Payer: PRIVATE HEALTH INSURANCE | Admitting: Nurse Practitioner

## 2022-12-08 DIAGNOSIS — Z760 Encounter for issue of repeat prescription: Secondary | ICD-10-CM

## 2022-12-08 DIAGNOSIS — E782 Mixed hyperlipidemia: Secondary | ICD-10-CM

## 2022-12-08 DIAGNOSIS — I10 Essential (primary) hypertension: Secondary | ICD-10-CM | POA: Diagnosis not present

## 2022-12-08 DIAGNOSIS — Z794 Long term (current) use of insulin: Secondary | ICD-10-CM

## 2022-12-08 DIAGNOSIS — E1159 Type 2 diabetes mellitus with other circulatory complications: Secondary | ICD-10-CM | POA: Diagnosis not present

## 2022-12-08 MED ORDER — CLOPIDOGREL BISULFATE 75 MG PO TABS: 75.0000 mg | ORAL_TABLET | Freq: Every day | ORAL | 1 refills | Status: DC

## 2022-12-08 MED ORDER — OMEGA-3 FATTY ACIDS 1000 MG PO CAPS: 2.0000 g | ORAL_CAPSULE | Freq: Every day | ORAL | 1 refills | Status: DC

## 2022-12-08 MED ORDER — AMLODIPINE BESYLATE 10 MG PO TABS: ORAL_TABLET | Freq: Every day | ORAL | 3 refills | Status: DC

## 2022-12-08 MED ORDER — LOSARTAN POTASSIUM 100 MG PO TABS: 100.0000 mg | ORAL_TABLET | Freq: Every day | ORAL | 0 refills | Status: DC

## 2022-12-08 MED ORDER — GLIPIZIDE 10 MG PO TABS: ORAL_TABLET | Freq: Two times a day (BID) | ORAL | 3 refills | Status: AC

## 2022-12-08 MED ORDER — ATORVASTATIN CALCIUM 80 MG PO TABS: 80.0000 mg | ORAL_TABLET | Freq: Every day | ORAL | 1 refills | Status: DC

## 2022-12-08 NOTE — Telephone Encounter (Signed)
Medication refilled,results note sent to [facility]

## 2022-12-10 ENCOUNTER — Encounter (HOSPITAL_COMMUNITY): Payer: Self-pay

## 2022-12-10 ENCOUNTER — Ambulatory Visit (HOSPITAL_COMMUNITY)
Admission: EM | Admit: 2022-12-10 | Discharge: 2022-12-10 | Disposition: A | Discharge status: Home / Self Care | Payer: PRIVATE HEALTH INSURANCE | Attending: Emergency Medicine | Admitting: Emergency Medicine

## 2022-12-10 DIAGNOSIS — N764 Abscess of vulva: Secondary | ICD-10-CM | POA: Diagnosis not present

## 2022-12-10 MED ORDER — DOXYCYCLINE HYCLATE 100 MG PO CAPS: 100.0000 mg | ORAL_CAPSULE | Freq: Two times a day (BID) | ORAL | 0 refills | Status: AC

## 2022-12-10 MED ORDER — LIDOCAINE-EPINEPHRINE 1 %-1:100000 IJ SOLN
INTRAMUSCULAR | Status: AC
  Filled 2022-12-10: qty 1

## 2022-12-10 NOTE — Discharge Instructions (Signed)
We drained your cyst today in the clinic and left open to continue to drain.  Continue to use warm compresses to the wound to further encourage drainage from the wound.  You may do this in the shower as well with gentle compresses to the area to allow more infected material to drain.    Take doxycycline antibiotic twice daily for the next 7 days to treat infection to the cyst.  Change your dressings twice daily as the wound heals.  Do not apply any ointments, lotions, or powders to the wound.  You may take Tylenol/ibuprofen as needed for pain once the numbing wears off.  If you notice any worsening signs of infection such as redness, swelling, fever, or worsening drainage, please return to urgent care for reevaluation.  Call the general surgeon listed on your paperwork to schedule an appointment for evaluation to see if you are a candidate to have the cyst removed via surgery.  If you develop any new or worsening symptoms or do not improve in the next 2 to 3 days, please return.  If your symptoms are severe, please go to the emergency room.  Follow-up with your primary care provider for further evaluation and management of your symptoms as well as ongoing wellness visits.  I hope you feel better!

## 2022-12-10 NOTE — ED Provider Notes (Signed)
[facility]    CSN: [id number] Arrival date & time: 12/10/22  1050      History   Chief Complaint Chief Complaint  Patient presents with   Abscess    HPI Tammy Bauer is a 57 y.o. female.   Patient presents urgent care for evaluation of right-sided labial infected cyst that started approximately 1 week ago.  She has had abscess to this area in the past, most recently had it drained approximately 6 months ago.  She has been using warm compresses and Epsom salt soaks over the past week and attempt to get the abscess to drain without relief.  Denies recent antibiotic use in the last 1-2 months. Has not experienced any fever, chills, vaginal discharge, or abdominal discomfort. No urinary symptoms reported. She has not shaved her suprapubic area recently, denies recent injury to vaginal area. States the area is warm, swollen, red, and very tender.    Abscess   Past Medical History:  Diagnosis Date   Anemia    Diabetes mellitus without complication (HCC)    Hypertension    Stroke (HCC)    x2; no deficits   Thyroid nodule 10/02/2017    Patient Active Problem List   Diagnosis Date Noted   Pneumonia due to COVID-19 virus 06/20/2020   Dehydration 06/20/2020   Hyponatremia 06/20/2020   Acute respiratory failure with hypoxia (HCC) 06/20/2020   Diplopia 05/22/2019   Headache 05/22/2019   Hyperlipidemia 05/22/2019   TIA (transient ischemic attack) 05/21/2019   Vaginal bleeding 01/03/2018   Acute blood loss anemia 01/03/2018   Anemia 01/03/2018   Acute ischemic stroke (HCC) 10/02/2017   DM2 (diabetes mellitus, type 2) (HCC) 10/02/2017   Acute CVA (cerebrovascular accident) (HCC) 06/25/2017   Hypertensive emergency 06/25/2017   Tobacco abuse 06/25/2017   Essential hypertension 10/07/2015   Hyperglycemia 10/07/2015    Past Surgical History:  Procedure Laterality Date   ENDOMETRIAL ABLATION N/A 01/30/2018   Procedure: ENDOMETRIAL ABLATION WITH MINERVA;  Surgeon: Lazaro Arms, MD;  Location: [facility];  Service: Gynecology;  Laterality: N/A;   HYSTEROSCOPY WITH D & C N/A 01/30/2018   Procedure: DILATATION AND CURETTAGE /HYSTEROSCOPY;  Surgeon: Lazaro Arms, MD;  Location: [facility];  Service: Gynecology;  Laterality: N/A;   LOOP RECORDER INSERTION N/A 06/27/2017   Procedure: Loop Recorder Insertion;  Surgeon: Regan Lemming, MD;  Location: [facility];  Service: Cardiovascular;  Laterality: N/A;   NERVE, TENDON AND ARTERY REPAIR Left 10/21/2019   Procedure: WOUND EXPLORATION LEFT HAND WITH REPAIR TENDON, ARTERY AND NERVE ;  Surgeon: Betha Loa, MD;  Location: [facility];  Service: Orthopedics;  Laterality: Left;   TEE WITHOUT CARDIOVERSION N/A 06/27/2017   Procedure: TRANSESOPHAGEAL ECHOCARDIOGRAM (TEE);  Surgeon: [name], MD;  [facility];  Service: Cardiovascular;  Laterality: N/A;    OB History   No obstetric history on file.      Home Medications    Prior to Admission medications   Medication Sig Start Date End Date Taking? Authorizing Provider  doxycycline (VIBRAMYCIN) 100 MG capsule Take 1 capsule (100 mg total) by mouth 2 (two) times daily for 7 days. 12/10/22 12/17/22 Yes StanhopeDonavan Burnet, FNP  acetaminophen (TYLENOL) 325 MG tablet Take 650 mg by mouth every 6 (six) hours as needed for mild pain or moderate pain.     [provider]  amLODipine (NORVASC) 10 MG tablet TAKE 1 TABLET (10 MG TOTAL) BY MOUTH DAILY. 12/08/22 12/08/23  Daryll Drown, NP  atorvastatin (LIPITOR) 80 MG tablet Take 1 tablet (80 mg total) by mouth at bedtime. 12/08/22   Daryll Drown, NP  clopidogrel (PLAVIX) 75 MG tablet Take 1 tablet (75 mg total) by mouth daily. 12/08/22   Daryll Drown, NP  fish oil-omega-3 fatty acids 1000 MG capsule Take 2 capsules (2 g total) by mouth daily. 12/08/22   Daryll Drown, NP  glipiZIDE (GLUCOTROL) 10 MG tablet TAKE 1 TABLET (10 MG TOTAL) BY MOUTH 2 (TWO) TIMES DAILY BEFORE A MEAL.  12/08/22 12/08/23  Daryll Drown, NP  insulin glargine-yfgn (SEMGLEE, YFGN,) 100 UNIT/ML injection Inject 0.3 mLs (30 Units total) into the skin 2 (two) times daily. 03/07/22   Hoy Register, MD  insulin regular (HUMULIN R) 100 units/mL injection Inject 0.12 mLs (12 Units total) into the skin 3 (three) times daily before meals. Take insulin when blood sugars are 201-250 give 4 units, 251-300 give 6 units, 301-350 give 8 units, 351-400 give 10 units,> 400 give 12 units and call M.D. Discussed hypoglycemia protocol. 05/25/21   Grayce Sessions, NP  INSULIN SYRINGE 1CC/29G (B-D INSULIN SYRINGE) 29G X 1/2" 1 ML MISC 1 application by Does not apply route at bedtime. 06/01/20   Grayce Sessions, NP  losartan (COZAAR) 100 MG tablet Take 1 tablet (100 mg total) by mouth daily. 12/08/22   Daryll Drown, NP    Family History Family History  Problem Relation Age of Onset   Stroke Maternal Grandfather     Social History Social History   Tobacco Use   Smoking status: Every Day    Packs/day: 0.25    Years: 20.00    Total pack years: 5.00    Types: Cigarettes    Start date: 08/29/1999   Smokeless tobacco: Never   Tobacco comments:    smoke 5 cigarettes a day  Vaping Use   Vaping Use: Never used  Substance Use Topics   Alcohol use: No   Drug use: No     Allergies   Ace inhibitors   Review of Systems Review of Systems   Physical Exam Triage Vital Signs ED Triage Vitals [12/10/22 1201]  Enc Vitals Group     BP (!) 178/112     Pulse Rate (!) 112     Resp 18     Temp 98.4 F (36.9 C)     Temp Source Oral     SpO2 98 %     Weight      Height      Head Circumference      Peak Flow      Pain Score      Pain Loc      Pain Edu?      Excl. in GC?    No data found.  Updated Vital Signs BP (!) 178/112 (BP Location: Left Arm)   Pulse (!) 112   Temp 98.4 F (36.9 C) (Oral)   Resp 18   SpO2 98%   Visual Acuity Right Eye Distance:   Left Eye Distance:   Bilateral  Distance:    Right Eye Near:   Left Eye Near:    Bilateral Near:     Physical Exam Vitals and nursing note reviewed. Exam conducted with a chaperone present Percell Belt, CMA).  Constitutional:      Appearance: She is not ill-appearing or toxic-appearing.  HENT:     Head: Normocephalic and atraumatic.     Right Ear: Hearing and external  ear normal.     Left Ear: Hearing and external ear normal.     Nose: Nose normal.     Mouth/Throat:     Lips: Pink.  Eyes:     General: Lids are normal. Vision grossly intact. Gaze aligned appropriately.     Extraocular Movements: Extraocular movements intact.     Conjunctiva/sclera: Conjunctivae normal.  Cardiovascular:     Heart sounds: S1 normal and S2 normal.  Pulmonary:     Breath sounds: Normal air entry.  Genitourinary:    Labia:        Right: Tenderness and lesion present.        Comments: 3cm long by 2cm wide abscess to the right suprapubic area that extends to the right labia majora. Abscess is fluctuant, swollen, erythematous, and very tender to palpation. There is a surrounding area of soft-tissue swelling and induration.  Musculoskeletal:     Cervical back: Neck supple.  Skin:    General: Skin is warm and dry.     Capillary Refill: Capillary refill takes less than 2 seconds.     Findings: Abscess present. No rash.  Neurological:     General: No focal deficit present.     Mental Status: She is alert and oriented to person, place, and time. Mental status is at baseline.     Cranial Nerves: No dysarthria or facial asymmetry.  Psychiatric:        Mood and Affect: Mood normal.        Speech: Speech normal.        Behavior: Behavior normal.        Thought Content: Thought content normal.        Judgment: Judgment normal.      UC Treatments / Results  Labs (all labs ordered are listed, but only abnormal results are displayed) Labs Reviewed - No data to display  EKG   Radiology No results found.  Procedures Incision and  Drainage  Date/Time: 12/10/2022 3:00 PM  Performed by: Talbot Grumbling, FNP Authorized by: Talbot Grumbling, FNP   Consent:    Consent obtained:  Verbal   Consent given by:  Patient   Risks, benefits, and alternatives were discussed: yes     Risks discussed:  Bleeding, damage to other organs, infection, incomplete drainage and pain   Alternatives discussed:  No treatment Universal protocol:    Patient identity confirmed:  Verbally with patient Location:    Type:  Abscess   Size:  3cm by 2cm   Location:  Anogenital   Anogenital location:  Vulva Pre-procedure details:    Skin preparation:  Antiseptic wash, povidone-iodine and chlorhexidine Sedation:    Sedation type:  None Anesthesia:    Anesthesia method:  Local infiltration   Local anesthetic:  Lidocaine 1% w/o epi Procedure type:    Complexity:  Complex Procedure details:    Incision types:  Single straight and stab incision   Incision depth:  Dermal   Wound management:  Probed and deloculated and irrigated with saline   Drainage:  Bloody and purulent   Drainage amount:  Moderate   Wound treatment:  Wound left open   Packing materials:  None Post-procedure details:    Procedure completion:  Tolerated well, no immediate complications  (including critical care time)  Medications Ordered in UC Medications - No data to display  Initial Impression / Assessment and Plan / UC Course  I have reviewed the triage vital signs and the nursing notes.  Pertinent labs &  imaging results that were available during my care of the patient were reviewed by me and considered in my medical decision making (see chart for details).   1. Labial abscess See incision and drainage note above for further detail regarding incision and drainage procedure.  Wound cleansed and dressed in clinic. She may use a peri pad to allow the wound to drain and change this at least twice daily if not more frequently over the next few days as the wound  continues to drain. Wound left open to continue to drain.  Patient instructed to gently massage wound in the shower and perform warm compresses to allow wound to drain further and completely.  Tylenol may be used as needed for pain once numbing wears off.  Patient placed on doxycycline antibiotic to be taken as prescribed.  Monitor for worsening signs of infection and encourage patient to return to urgent care for reevaluation if worsening swelling, pain, drainage, or fever/chills.  Patient agreeable with this plan.  Discussed physical exam and available lab work findings in clinic with patient.  Counseled patient regarding appropriate use of medications and potential side effects for all medications recommended or prescribed today. Discussed red flag signs and symptoms of worsening condition,when to call the PCP office, return to urgent care, and when to seek higher level of care in the emergency department. Patient verbalizes understanding and agreement with plan. All questions answered. Patient discharged in stable condition.    Final Clinical Impressions(s) / UC Diagnoses   Final diagnoses:  Labial abscess     Discharge Instructions      We drained your cyst today in the clinic and left open to continue to drain.  Continue to use warm compresses to the wound to further encourage drainage from the wound.  You may do this in the shower as well with gentle compresses to the area to allow more infected material to drain.    Take doxycycline antibiotic twice daily for the next 7 days to treat infection to the cyst.  Change your dressings twice daily as the wound heals.  Do not apply any ointments, lotions, or powders to the wound.  You may take Tylenol/ibuprofen as needed for pain once the numbing wears off.  If you notice any worsening signs of infection such as redness, swelling, fever, or worsening drainage, please return to urgent care for reevaluation.  Call the general surgeon listed  on your paperwork to schedule an appointment for evaluation to see if you are a candidate to have the cyst removed via surgery.  If you develop any new or worsening symptoms or do not improve in the next 2 to 3 days, please return.  If your symptoms are severe, please go to the emergency room.  Follow-up with your primary care provider for further evaluation and management of your symptoms as well as ongoing wellness visits.  I hope you feel better!    ED Prescriptions     Medication Sig Dispense Auth. Provider   doxycycline (VIBRAMYCIN) 100 MG capsule Take 1 capsule (100 mg total) by mouth 2 (two) times daily for 7 days. 14 capsule Carlisle Beers, FNP      PDMP not reviewed this encounter.   Carlisle Beers, [place] 12/10/22 [phone]

## 2022-12-10 NOTE — ED Triage Notes (Signed)
Pt is here for abscess on right leg for several days. Pt reports she is diabetic.

## 2023-03-01 ENCOUNTER — Ambulatory Visit (INDEPENDENT_AMBULATORY_CARE_PROVIDER_SITE_OTHER): Payer: PRIVATE HEALTH INSURANCE | Admitting: Family Medicine

## 2023-03-01 ENCOUNTER — Encounter: Payer: Self-pay | Admitting: Family Medicine

## 2023-03-01 VITALS — BP 158/76 | HR 94 | Temp 97.4°F | Ht 62.0 in | Wt 120.2 lb

## 2023-03-01 DIAGNOSIS — E1159 Type 2 diabetes mellitus with other circulatory complications: Secondary | ICD-10-CM

## 2023-03-01 DIAGNOSIS — E1169 Type 2 diabetes mellitus with other specified complication: Secondary | ICD-10-CM

## 2023-03-01 DIAGNOSIS — E041 Nontoxic single thyroid nodule: Secondary | ICD-10-CM

## 2023-03-01 DIAGNOSIS — I152 Hypertension secondary to endocrine disorders: Secondary | ICD-10-CM

## 2023-03-01 DIAGNOSIS — Z794 Long term (current) use of insulin: Secondary | ICD-10-CM

## 2023-03-01 DIAGNOSIS — E785 Hyperlipidemia, unspecified: Secondary | ICD-10-CM

## 2023-03-01 LAB — BAYER DCA HB A1C WAIVED: HB A1C (BAYER DCA - WAIVED): 14 % — ABNORMAL HIGH (ref 4.8–5.6)

## 2023-03-01 NOTE — Patient Instructions (Addendum)

## 2023-03-01 NOTE — Progress Notes (Signed)
Subjective:  Patient ID: Tammy Bauer, female    DOB: January 04, 1966, 57 y.o.   MRN: [id number]  Patient Care Team: Baruch Gouty, FNP as PCP - General (Family Medicine)   Chief Complaint:  Establish Care (Former [facility] patient /) and Medical Management of Chronic Issues (3 month follow up )   HPI: Tammy Bauer is a 57 y.o. female presenting on 03/01/2023 for [place] (Former [facility] patient /) and Medical Management of Chronic Issues (3 month follow up )   Pt presents today to establish care with new PCP and for management of chronic medical conditions.   1. Type 2 diabetes mellitus with other circulatory complication, with long-term current use of insulin (HCC) She has been taking Glipizide 10 mg twice daily and Semglee twice daily. She has not started the sliding scale insulin as she did not understand the instructions. Does report polyuria, polyphagia, or polydipsia. States she has lost significant weight over the last several months. Has night sweats, heat intolerance, thinning hair, and insomnia. Reports these symptoms have been present for several months and have been worsening.    2. Hypertension associated with diabetes ([place]) Has been taking medications as prescribed. BP remains elevated. No headaches or visual changes. No leg swelling, weakness, or confusion.   3. Hyperlipidemia associated with type 2 diabetes mellitus ([place]) On statin therapy. Does not follow a diet or exercise routine. No associated myalgias.     Relevant past medical, surgical, family, and social history reviewed and updated as indicated.  Allergies and medications reviewed and updated. Data reviewed: Chart in Epic.   Past Medical History:  Diagnosis Date   Anemia    Diabetes mellitus without complication ([place])    Hypertension    Stroke [facility])    x2; no deficits   Thyroid nodule 10/02/2017    Past Surgical History:  Procedure Laterality Date   ENDOMETRIAL ABLATION N/A 01/30/2018   Procedure: ENDOMETRIAL  ABLATION WITH MINERVA;  Surgeon: Florian Buff, MD;  Location: [facility];  Service: Gynecology;  Laterality: N/A;   HYSTEROSCOPY WITH D & C N/A 01/30/2018   Procedure: DILATATION AND CURETTAGE /HYSTEROSCOPY;  Surgeon: Florian Buff, MD;  Location: [facility];  Service: Gynecology;  Laterality: N/A;   LOOP RECORDER INSERTION N/A 06/27/2017   Procedure: Loop Recorder Insertion;  Surgeon: Constance Haw, MD;  Location: [facility];  Service: Cardiovascular;  Laterality: N/A;   NERVE, TENDON AND ARTERY REPAIR Left 10/21/2019   Procedure: WOUND EXPLORATION LEFT HAND WITH REPAIR TENDON, ARTERY AND NERVE ;  Surgeon: Leanora Cover, MD;  Location: [facility];  Service: Orthopedics;  Laterality: Left;   TEE WITHOUT CARDIOVERSION N/A 06/27/2017   Procedure: TRANSESOPHAGEAL ECHOCARDIOGRAM (TEE);  Surgeon: Pixie Casino, MD;  Location: [facility];  Service: Cardiovascular;  Laterality: N/A;    Social History   Socioeconomic History   Marital status: Married    Spouse name: Not on file   Number of children: Not on file   Years of education: Not on file   Highest education level: Not on file  Occupational History   Not on file  Tobacco Use   Smoking status: Every Day    Packs/day: 0.50    Years: 20.00    Additional pack years: 0.00    Total pack years: 10.00    Types: Cigarettes    Start date: 08/29/1999   Smokeless tobacco: Never   Tobacco comments:    smoke 5 cigarettes a day  Vaping Use   Vaping Use: Never used  Substance and Sexual Activity   Alcohol use: No   Drug use: No   Sexual activity: Not Currently    Birth control/protection: None  Other Topics Concern   Not on file  Social History Narrative   Not on file   Social Determinants of Health   Financial Resource Strain: Not on file  Food Insecurity: Not on file  Transportation Needs: Not on file  Physical Activity: Not on file  Stress: Not on file  Social Connections: Not on file  Intimate Partner  Violence: Not on file    Outpatient Encounter Medications as of 03/01/2023  Medication Sig   acetaminophen (TYLENOL) 325 MG tablet Take 650 mg by mouth every 6 (six) hours as needed for mild pain or moderate pain.    amLODipine (NORVASC) 10 MG tablet TAKE 1 TABLET (10 MG TOTAL) BY MOUTH DAILY.   atorvastatin (LIPITOR) 80 MG tablet Take 1 tablet (80 mg total) by mouth at bedtime.   clopidogrel (PLAVIX) 75 MG tablet Take 1 tablet (75 mg total) by mouth daily.   fish oil-omega-3 fatty acids 1000 MG capsule Take 2 capsules (2 g total) by mouth daily.   glipiZIDE (GLUCOTROL) 10 MG tablet TAKE 1 TABLET (10 MG TOTAL) BY MOUTH 2 (TWO) TIMES DAILY BEFORE A MEAL.   insulin glargine-yfgn (SEMGLEE, YFGN,) 100 UNIT/ML injection Inject 0.3 mLs (30 Units total) into the skin 2 (two) times daily.   INSULIN SYRINGE 1CC/29G (B-D INSULIN SYRINGE) 29G X 1/2" 1 ML MISC 1 application by Does not apply route at bedtime.   losartan (COZAAR) 100 MG tablet Take 1 tablet (100 mg total) by mouth daily.   [DISCONTINUED] insulin regular (HUMULIN R) 100 units/mL injection Inject 0.12 mLs (12 Units total) into the skin 3 (three) times daily before meals. Take insulin when blood sugars are 201-250 give 4 units, 251-300 give 6 units, 301-350 give 8 units, 351-400 give 10 units,> 400 give 12 units and call M.D. Discussed hypoglycemia protocol. (Patient not taking: Reported on 03/01/2023)   No facility-administered encounter medications on file as of 03/01/2023.    Allergies  Allergen Reactions   Ace Inhibitors Cough    Review of Systems  Constitutional:  Positive for activity change, appetite change and unexpected weight change. Negative for chills, diaphoresis, fatigue and fever.  HENT: Negative.    Eyes: Negative.  Negative for photophobia and visual disturbance.  Respiratory:  Negative for cough, chest tightness and shortness of breath.   Cardiovascular:  Negative for chest pain, palpitations and leg swelling.   Gastrointestinal:  Negative for abdominal pain, blood in stool, constipation, diarrhea, nausea and vomiting.  Endocrine: Positive for heat intolerance. Negative for cold intolerance, polydipsia, polyphagia and polyuria.  Genitourinary:  Negative for decreased urine volume, difficulty urinating, dysuria, frequency and urgency.  Musculoskeletal:  Negative for arthralgias and myalgias.  Skin: Negative.   Allergic/Immunologic: Negative.   Neurological:  Negative for dizziness and headaches.  Hematological: Negative.   Psychiatric/Behavioral:  Positive for sleep disturbance. Negative for agitation, behavioral problems, confusion, decreased concentration, dysphoric mood, hallucinations, self-injury and suicidal ideas. The patient is nervous/anxious. The patient is not hyperactive.   All other systems reviewed and are negative.       Objective:  BP (!) 158/76   Pulse 94   Temp (!) 97.4 F (36.3 C) (Temporal)   Ht 5\' 2"  (1.575 m)   Wt 120 lb 3.2 oz (54.5 kg)   SpO2 96%  BMI 21.98 kg/m    Wt Readings from Last 3 Encounters:  03/01/23 120 lb 3.2 oz (54.5 kg)  12/01/22 121 lb (54.9 kg)  08/27/21 145 lb (65.8 kg)    Physical Exam Vitals and nursing note reviewed.  Constitutional:      General: She is not in acute distress.    Appearance: Normal appearance. She is well-developed, well-groomed and normal weight. She is not ill-appearing, toxic-appearing or diaphoretic.  HENT:     Head: Normocephalic and atraumatic.     Jaw: There is normal jaw occlusion.     Right Ear: Hearing normal.     Left Ear: Hearing normal.     Nose: Nose normal.     Mouth/Throat:     Lips: Pink.     Mouth: Mucous membranes are moist.     Pharynx: Oropharynx is clear. Uvula midline.  Eyes:     General: Lids are normal.     Extraocular Movements: Extraocular movements intact.     Conjunctiva/sclera: Conjunctivae normal.     Pupils: Pupils are equal, round, and reactive to light.  Neck:     Thyroid:  Thyromegaly present. No thyroid mass or thyroid tenderness.     Vascular: No carotid bruit or JVD.     Trachea: Trachea and phonation normal.      Comments: Thyroid: right side larger than left. No tenderness or erythema.  Cardiovascular:     Rate and Rhythm: Regular rhythm. Tachycardia present.     Chest Wall: PMI is not displaced.     Pulses: Normal pulses.     Heart sounds: Normal heart sounds. No murmur heard.    No friction rub. No gallop.  Pulmonary:     Effort: Pulmonary effort is normal. No respiratory distress.     Breath sounds: Normal breath sounds. No wheezing.  Abdominal:     General: Bowel sounds are normal. There is no distension or abdominal bruit.     Palpations: Abdomen is soft. There is no hepatomegaly or splenomegaly.     Tenderness: There is no abdominal tenderness. There is no right CVA tenderness or left CVA tenderness.     Hernia: No hernia is present.  Musculoskeletal:        General: Normal range of motion.     Cervical back: Normal range of motion and neck supple.     Right lower leg: No edema.     Left lower leg: No edema.  Lymphadenopathy:     Cervical: No cervical adenopathy.  Skin:    General: Skin is warm and dry.     Capillary Refill: Capillary refill takes less than 2 seconds.     Coloration: Skin is not cyanotic, jaundiced or pale.     Findings: No rash.  Neurological:     General: No focal deficit present.     Mental Status: She is alert and oriented to person, place, and time.     Sensory: Sensation is intact.     Motor: Motor function is intact.     Coordination: Coordination is intact.     Gait: Gait is intact.     Deep Tendon Reflexes: Reflexes are normal and symmetric.  Psychiatric:        Attention and Perception: Attention and perception normal.        Mood and Affect: Mood and affect normal.        Speech: Speech normal.        Behavior: Behavior normal. Behavior is cooperative.  Thought Content: Thought content normal.         Cognition and Memory: Cognition and memory normal.        Judgment: Judgment normal.     Results for orders placed or performed in visit on 03/01/23  Bayer DCA Hb A1c Waived  Result Value Ref Range   HB A1C (BAYER DCA - WAIVED) 14.0 (H) 4.8 - 5.6 %       Pertinent labs & imaging results that were available during my care of the patient were reviewed by me and considered in my medical decision making.  Assessment & Plan:  Cynitha was seen today for establish care and medical management of chronic issues.  Diagnoses and all orders for this visit:  Type 2 diabetes mellitus with other circulatory complication, with long-term current use of insulin ([place]) A1C greater than 14 in office today. Referral to endocrinology placed. Labs pending. Thyroid goiter present, concerned this could be contributing to uncontrolled diabetes. -     Lipid panel -     CBC with Differential/Platelet -     CMP14+EGFR -     Bayer DCA Hb A1c Waived -     Ambulatory referral to Endocrinology  Hypertension associated with diabetes ([place]) Not controlled. DASH diet and exercise encouraged. Pt to follow up in  month for reevaluation.  -     Lipid panel -     CBC with Differential/Platelet -     CMP14+EGFR  Hyperlipidemia associated with type 2 diabetes mellitus ([place]) Labs pending. Diet and exercise encouraged. Will adjust regimen if warranted.  -     Lipid panel -     CMP14+EGFR  Thyroid nodule New finding with symptoms. Will obtain US and labs. Referral to endocrinology placed.  -     Thyroid Panel With TSH -     Thyroid peroxidase antibody -     US THYROID; Future -     Ambulatory referral to Endocrinology     Continue all other maintenance medications.  Follow up plan: Return in about 1 month (around 04/01/2023) for thyroid.   Continue healthy lifestyle choices, including diet (rich in fruits, vegetables, and lean proteins, and low in salt and simple carbohydrates) and exercise (at least 30  minutes of moderate physical activity daily).  Educational handout given for DM  The above assessment and management plan was discussed with the patient. The patient verbalized understanding of and has agreed to the management plan. Patient is aware to call the clinic if they develop any new symptoms or if symptoms persist or worsen. Patient is aware when to return to the clinic for a follow-up visit. Patient educated on when it is appropriate to go to the emergency department.   Monia Pouch, FNP-C [facility] [phone]

## 2023-03-02 LAB — CMP14+EGFR
ALT: 16 IU/L (ref 0–32)
AST: 13 IU/L (ref 0–40)
Albumin/Globulin Ratio: 1.6 (ref 1.2–2.2)
Albumin: 4.5 g/dL (ref 3.8–4.9)
Alkaline Phosphatase: 112 IU/L (ref 44–121)
BUN/Creatinine Ratio: 18 (ref 9–23)
BUN: 13 mg/dL (ref 6–24)
Bilirubin Total: 0.4 mg/dL (ref 0.0–1.2)
CO2: 23 mmol/L (ref 20–29)
Calcium: 10.9 mg/dL — ABNORMAL HIGH (ref 8.7–10.2)
Chloride: 99 mmol/L (ref 96–106)
Creatinine, Ser: 0.71 mg/dL (ref 0.57–1.00)
Globulin, Total: 2.8 g/dL (ref 1.5–4.5)
Glucose: 387 mg/dL — ABNORMAL HIGH (ref 70–99)
Potassium: 4.5 mmol/L (ref 3.5–5.2)
Sodium: 137 mmol/L (ref 134–144)
Total Protein: 7.3 g/dL (ref 6.0–8.5)
eGFR: 101 mL/min/{1.73_m2} (ref >59)

## 2023-03-02 LAB — CBC WITH DIFFERENTIAL/PLATELET
Basophils Absolute: 0 10*3/uL (ref 0.0–0.2)
Basos: 0 %
EOS (ABSOLUTE): 0 10*3/uL (ref 0.0–0.4)
Eos: 1 %
Hematocrit: 53.4 % — ABNORMAL HIGH (ref 34.0–46.6)
Hemoglobin: 17.7 g/dL — ABNORMAL HIGH (ref 11.1–15.9)
Immature Grans (Abs): 0 10*3/uL (ref 0.0–0.1)
Immature Granulocytes: 0 %
Lymphocytes Absolute: 2 10*3/uL (ref 0.7–3.1)
Lymphs: 43 %
MCH: 32.1 pg (ref 26.6–33.0)
MCHC: 33.1 g/dL (ref 31.5–35.7)
MCV: 97 fL (ref 79–97)
Monocytes Absolute: 0.3 10*3/uL (ref 0.1–0.9)
Monocytes: 7 %
Neutrophils Absolute: 2.3 10*3/uL (ref 1.4–7.0)
Neutrophils: 49 %
Platelets: 332 10*3/uL (ref 150–450)
RBC: 5.52 x10E6/uL — ABNORMAL HIGH (ref 3.77–5.28)
RDW: 11.9 % (ref 11.7–15.4)
WBC: 4.7 10*3/uL (ref 3.4–10.8)

## 2023-03-02 LAB — LIPID PANEL
Chol/HDL Ratio: 5.1 ratio — ABNORMAL HIGH (ref 0.0–4.4)
Cholesterol, Total: 228 mg/dL — ABNORMAL HIGH (ref 100–199)
HDL: 45 mg/dL (ref >39)
LDL Chol Calc (NIH): 139 mg/dL — ABNORMAL HIGH (ref 0–99)
Triglycerides: 245 mg/dL — ABNORMAL HIGH (ref 0–149)
VLDL Cholesterol Cal: 44 mg/dL — ABNORMAL HIGH (ref 5–40)

## 2023-03-04 LAB — THYROID ANTIBODIES
Thyroglobulin Antibody: <1 IU/mL (ref 0.0–0.9)
Thyroperoxidase Ab SerPl-aCnc: 13 IU/mL (ref 0–34)

## 2023-03-04 LAB — THYROID PANEL WITH TSH
Free Thyroxine Index: 2.4 (ref 1.2–4.9)
T3 Uptake Ratio: 26 % (ref 24–39)
T4, Total: 9.3 ug/dL (ref 4.5–12.0)
TSH: 1.07 u[IU]/mL (ref 0.450–4.500)

## 2023-03-04 LAB — SPECIMEN STATUS REPORT

## 2023-03-06 ENCOUNTER — Ambulatory Visit (HOSPITAL_COMMUNITY)
Admission: RE | Admit: 2023-03-06 | Discharge: 2023-03-06 | Disposition: A | Discharge status: Home / Self Care | Payer: 59 | Source: Ambulatory Visit | Attending: Family Medicine | Admitting: Family Medicine

## 2023-03-06 DIAGNOSIS — E041 Nontoxic single thyroid nodule: Secondary | ICD-10-CM | POA: Diagnosis present

## 2023-03-19 ENCOUNTER — Other Ambulatory Visit: Payer: Self-pay | Admitting: *Deleted

## 2023-03-19 ENCOUNTER — Other Ambulatory Visit: Payer: PRIVATE HEALTH INSURANCE

## 2023-03-19 DIAGNOSIS — I10 Essential (primary) hypertension: Secondary | ICD-10-CM

## 2023-03-19 MED ORDER — LOSARTAN POTASSIUM 100 MG PO TABS: 100.0000 mg | ORAL_TABLET | Freq: Every day | ORAL | 0 refills | Status: DC

## 2023-03-20 ENCOUNTER — Encounter (HOSPITAL_COMMUNITY): Payer: Self-pay

## 2023-03-20 ENCOUNTER — Other Ambulatory Visit: Payer: Self-pay

## 2023-03-20 ENCOUNTER — Emergency Department (HOSPITAL_COMMUNITY)
Admission: EM | Admit: 2023-03-20 | Discharge: 2023-03-20 | Disposition: A | Discharge status: Home / Self Care | Payer: 59 | Attending: Emergency Medicine | Admitting: Emergency Medicine

## 2023-03-20 ENCOUNTER — Emergency Department (HOSPITAL_COMMUNITY): Payer: 59

## 2023-03-20 DIAGNOSIS — Z7984 Long term (current) use of oral hypoglycemic drugs: Secondary | ICD-10-CM | POA: Diagnosis not present

## 2023-03-20 DIAGNOSIS — E119 Type 2 diabetes mellitus without complications: Secondary | ICD-10-CM | POA: Diagnosis not present

## 2023-03-20 DIAGNOSIS — I160 Hypertensive urgency: Secondary | ICD-10-CM | POA: Diagnosis not present

## 2023-03-20 DIAGNOSIS — I10 Essential (primary) hypertension: Secondary | ICD-10-CM | POA: Insufficient documentation

## 2023-03-20 DIAGNOSIS — Z79899 Other long term (current) drug therapy: Secondary | ICD-10-CM | POA: Insufficient documentation

## 2023-03-20 DIAGNOSIS — Z794 Long term (current) use of insulin: Secondary | ICD-10-CM | POA: Diagnosis not present

## 2023-03-20 DIAGNOSIS — Z7902 Long term (current) use of antithrombotics/antiplatelets: Secondary | ICD-10-CM | POA: Diagnosis not present

## 2023-03-20 DIAGNOSIS — R519 Headache, unspecified: Secondary | ICD-10-CM | POA: Diagnosis present

## 2023-03-20 LAB — BASIC METABOLIC PANEL
Anion gap: 1 — ABNORMAL LOW (ref 5–15)
BUN: 14 mg/dL (ref 6–20)
CO2: 26 mmol/L (ref 22–32)
Calcium: 9.4 mg/dL (ref 8.9–10.3)
Chloride: 104 mmol/L (ref 98–111)
Creatinine, Ser: 0.57 mg/dL (ref 0.44–1.00)
GFR, Estimated: >60 mL/min (ref >60)
Glucose, Bld: 313 mg/dL — ABNORMAL HIGH (ref 70–99)
Potassium: 3.7 mmol/L (ref 3.5–5.1)
Sodium: 131 mmol/L — ABNORMAL LOW (ref 135–145)

## 2023-03-20 LAB — CBC WITH DIFFERENTIAL/PLATELET
Abs Immature Granulocytes: 0.02 10*3/uL (ref 0.00–0.07)
Basophils Absolute: 0 10*3/uL (ref 0.0–0.1)
Basophils Relative: 1 %
Eosinophils Absolute: 0.1 10*3/uL (ref 0.0–0.5)
Eosinophils Relative: 1 %
HCT: 49 % — ABNORMAL HIGH (ref 36.0–46.0)
Hemoglobin: 16.7 g/dL — ABNORMAL HIGH (ref 12.0–15.0)
Immature Granulocytes: 0 %
Lymphocytes Relative: 34 %
Lymphs Abs: 2.1 10*3/uL (ref 0.7–4.0)
MCH: 32.9 pg (ref 26.0–34.0)
MCHC: 34.1 g/dL (ref 30.0–36.0)
MCV: 96.5 fL (ref 80.0–100.0)
Monocytes Absolute: 0.5 10*3/uL (ref 0.1–1.0)
Monocytes Relative: 9 %
Neutro Abs: 3.3 10*3/uL (ref 1.7–7.7)
Neutrophils Relative %: 55 %
Platelets: 344 10*3/uL (ref 150–400)
RBC: 5.08 MIL/uL (ref 3.87–5.11)
RDW: 12.4 % (ref 11.5–15.5)
WBC: 6 10*3/uL (ref 4.0–10.5)
nRBC: 0 % (ref 0.0–0.2)

## 2023-03-20 MED ORDER — CLONIDINE HCL 0.2 MG PO TABS
0.2000 mg | ORAL_TABLET | Freq: Once | ORAL | Status: AC
  Administered 2023-03-20: 0.2 mg via ORAL
  Filled 2023-03-20: qty 1

## 2023-03-20 MED ORDER — CLONIDINE HCL 0.1 MG PO TABS: 0.1000 mg | ORAL_TABLET | Freq: Three times a day (TID) | ORAL | 0 refills | Status: DC | PRN

## 2023-03-20 NOTE — ED Provider Notes (Signed)
[place] EMERGENCY DEPARTMENT AT [facility] Provider Note   CSN: [id number] Arrival date & time: 03/20/23  0450     History  Chief Complaint  Patient presents with   Headache    Tammy Bauer is a 57 y.o. female.  Patient is a 57 year old female with past medical history of prior CVA, hypertension, diabetes, hyperlipidemia.  Patient presenting today with complaints of headache and elevated blood pressure.  She checked her blood pressure at home and it was over 200 systolic.  She denies any visual disturbances, weakness, or numbness.  Patient reports having had a stroke 5 years ago and these symptoms seem similar.  The prior stroke did not leave her with any notable deficits.  She has been prescribed Plavix and reports being compliant with this.  The history is provided by the patient.       Home Medications Prior to Admission medications   Medication Sig Start Date End Date Taking? Authorizing Provider  acetaminophen (TYLENOL) 325 MG tablet Take 650 mg by mouth every 6 (six) hours as needed for mild pain or moderate pain.     [provider]  amLODipine (NORVASC) 10 MG tablet TAKE 1 TABLET (10 MG TOTAL) BY MOUTH DAILY. 12/08/22 12/08/23  Daryll Drown, NP  atorvastatin (LIPITOR) 80 MG tablet Take 1 tablet (80 mg total) by mouth at bedtime. 12/08/22   Daryll Drown, NP  clopidogrel (PLAVIX) 75 MG tablet Take 1 tablet (75 mg total) by mouth daily. 12/08/22   Daryll Drown, NP  fish oil-omega-3 fatty acids 1000 MG capsule Take 2 capsules (2 g total) by mouth daily. 12/08/22   Daryll Drown, NP  glipiZIDE (GLUCOTROL) 10 MG tablet TAKE 1 TABLET (10 MG TOTAL) BY MOUTH 2 (TWO) TIMES DAILY BEFORE A MEAL. 12/08/22 12/08/23  Daryll Drown, NP  insulin glargine-yfgn (SEMGLEE, YFGN,) 100 UNIT/ML injection Inject 0.3 mLs (30 Units total) into the skin 2 (two) times daily. 03/07/22   Hoy Register, MD  INSULIN SYRINGE 1CC/29G (B-D INSULIN SYRINGE) 29G X 1/2" 1 ML MISC 1  application by Does not apply route at bedtime. 06/01/20   Grayce Sessions, NP  losartan (COZAAR) 100 MG tablet Take 1 tablet (100 mg total) by mouth daily. 03/19/23   [name], FNP      Allergies    Ace inhibitors    Review of Systems   Review of Systems  All other systems reviewed and are negative.   Physical Exam Updated Vital Signs Pulse 83   Temp 97.9 F (36.6 C)   Wt 54.4 kg   SpO2 98%   BMI 21.95 kg/m  Physical Exam Vitals and nursing note reviewed.  Constitutional:      General: She is not in acute distress.    Appearance: She is well-developed. She is not diaphoretic.  HENT:     Head: Normocephalic and atraumatic.  Eyes:     Extraocular Movements: Extraocular movements intact.     Pupils: Pupils are equal, round, and reactive to light.  Cardiovascular:     Rate and Rhythm: Normal rate and regular rhythm.     Heart sounds: No murmur heard.    No friction rub. No gallop.  Pulmonary:     Effort: Pulmonary effort is normal. No respiratory distress.     Breath sounds: Normal breath sounds. No wheezing.  Abdominal:     General: Bowel sounds are normal. There is no distension.     Palpations: Abdomen is  soft.     Tenderness: There is no abdominal tenderness.  Musculoskeletal:        General: Normal range of motion.     Cervical back: Normal range of motion and neck supple.  Skin:    General: Skin is warm and dry.  Neurological:     General: No focal deficit present.     Mental Status: She is alert and oriented to person, place, and time. Mental status is at baseline.     Cranial Nerves: No cranial nerve deficit, dysarthria or facial asymmetry.  Psychiatric:        Mood and Affect: Mood normal.     ED Results / Procedures / Treatments   Labs (all labs ordered are listed, but only abnormal results are displayed) Labs Reviewed  BASIC METABOLIC PANEL  CBC WITH DIFFERENTIAL/PLATELET    EKG EKG Interpretation  Date/Time:  Tuesday March 20 2023  05:20:24 EDT Ventricular Rate:  74 PR Interval:  141 QRS Duration: 95 QT Interval:  393 QTC Calculation: 436 R Axis:   75 Text Interpretation: Sinus rhythm Baseline wander in lead(s) V3 No significant change since 05/21/2019 Confirmed by Geoffery Lyons ([id number]) on 03/20/2023 5:24:43 AM  Radiology No results found.  Procedures Procedures    Medications Ordered in ED Medications  cloNIDine (CATAPRES) tablet 0.2 mg (has no administration in time range)    ED Course/ Medical Decision Making/ A&P  Patient is a 57 year old female with history of prior stroke and hypertension presenting with elevated blood pressure and headache.  She was getting readings of over 200 at home and was concerned she may have another stroke.  Patient arrives here with otherwise stable vital signs.  Initial blood pressure was 206/104.  Physical examination is unremarkable and patient is neurologically intact.  Workup initiated including CBC and basic metabolic panel, both of which are unremarkable.  CT scan of the head shows no acute process.  She was given 0.2 mg of clonidine with good improvement in her blood pressure.  Blood pressure at the time of my reassessment is 153/93 and patient is feeling better.  When improving blood pressures and improving symptoms, I feel as though patient can safely be discharged.  There is no sign of endorgan damage in her workup.  EKG is unchanged, head CT is negative, and renal function is baseline.  I will discharge patient with continued use of her antihypertensives.  As she had a nice response to the clonidine, I will prescribe some of these which she can take on an as-needed basis.  She is to keep a record of her blood pressures at home and follow-up with her primary doctor.  Final Clinical Impression(s) / ED Diagnoses Final diagnoses:  None    Rx / DC Orders ED Discharge Orders     None         Geoffery Lyons, MD 03/20/23 [phone]

## 2023-03-20 NOTE — ED Triage Notes (Signed)
Pt c/o headache x1 hour. Hx of stroke. Pt hypertensive in 220s.

## 2023-03-20 NOTE — Discharge Instructions (Signed)
Continue medications as previously prescribed.  If your blood pressures continue to run greater than 180 systolic, begin taking clonidine as prescribed as needed.  Keep a record of your blood pressures at home and follow-up with your doctor in 1 week to go over the results.  Return to the emergency department in the meantime if your symptoms significantly worsen or change.

## 2023-03-21 ENCOUNTER — Telehealth: Payer: Self-pay

## 2023-03-21 NOTE — Transitions of Care (Post Inpatient/ED Visit) (Signed)
   03/21/2023  Name: Tammy Bauer MRN: [id number] DOB: 1966-05-01  Today's TOC FU Call Status: Today's TOC FU Call Status:: Successful TOC FU Call Competed TOC FU Call Complete Date: 03/21/23  Transition Care Management Follow-up Telephone Call Date of Discharge: 03/20/23 [facility] Type of Discharge: Emergency Department Reason for ED Visit: Other: (hypertensive urgency) How have you been since you were released from the hospital?: Better Any questions or concerns?: No  Items Reviewed: Did you receive and understand the discharge instructions provided?: Yes Medications obtained and verified?: Yes (Medications Reviewed) Any new allergies since your discharge?: No Dietary orders reviewed?: Yes Do you have support at home?: No  Home Care and Equipment/Supplies: Were Home Health Services Ordered?: NA Any new equipment or medical supplies ordered?: NA  Functional Questionnaire: Do you need assistance with bathing/showering or dressing?: No Do you need assistance with meal preparation?: No Do you need assistance with eating?: No Do you have difficulty maintaining continence: No Do you need assistance with getting out of bed/getting out of a chair/moving?: No Do you have difficulty managing or taking your medications?: No  Follow up appointments reviewed: PCP Follow-up appointment confirmed?: Yes Date of PCP follow-up appointment?: 04/03/23 Follow-up Provider: [facility] Follow-up appointment confirmed?: NA Do you need transportation to your follow-up appointment?: No Do you understand care options if your condition(s) worsen?: Yes-patient verbalized understanding    SIGNATURE Karena Addison, LPN [facility] Nurse Health Advisor Direct Dial [phone]

## 2023-03-30 NOTE — Patient Instructions (Incomplete)
Our records indicate that you are due for your annual mammogram/breast imaging. While there is no way to prevent breast cancer, early detection provides the best opportunity for curing it. For women over the age of 40, the American Cancer Society recommends a yearly clinical breast exam and a yearly mammogram. These practices have saved thousands of lives. We need your help to ensure that you are receiving optimal medical care. Please call the imaging location that has done you previous mammograms. Please remember to list us as your primary care. This helps make sure we receive a report and can update your chart.  Below is the contact information for several local breast imaging centers. You may call the location that works best for you, and they will be happy to assistance in making you an appointment. You do not need an order for a regular screening mammogram. However, if you are having any problems or concerns with you breast area, please let your primary care provider know, and appropriate orders will be placed. Please let our office know if you have any questions or concerns. Or if you need information for another imaging center not on this list or outside of the area. We are commented to working with you on your health care journey.   The mobile unit/bus ([facility]) - they come twice a month to our location.  These appointments can be made through our office or by call The Breast Center  [facility]  [address] [place], [place] [address] Phone [phone]  [facility] Radiology Department  [address]  [place], [place] [address] [phone]  [facility])  [address] [place], [place] [address] [phone]  [facility] [place]  [address] [place] [address] [phone]  [facility]  [address], [address] [place] [address] [phone]  [facility] [place]  [address] [place], [place] [address] [phone]  [facility] [address] [place], [place] [address] [phone]  [facility] [address]  [address] [place],  [address] [phone]  [facility] [address] [place] [address] [phone]     

## 2023-04-03 ENCOUNTER — Ambulatory Visit (INDEPENDENT_AMBULATORY_CARE_PROVIDER_SITE_OTHER): Payer: PRIVATE HEALTH INSURANCE | Admitting: Family Medicine

## 2023-04-03 ENCOUNTER — Encounter: Payer: Self-pay | Admitting: Family Medicine

## 2023-04-03 VITALS — BP 160/95 | HR 89 | Temp 97.5°F | Ht 62.0 in | Wt 121.0 lb

## 2023-04-03 DIAGNOSIS — I152 Hypertension secondary to endocrine disorders: Secondary | ICD-10-CM | POA: Diagnosis not present

## 2023-04-03 DIAGNOSIS — E1159 Type 2 diabetes mellitus with other circulatory complications: Secondary | ICD-10-CM | POA: Diagnosis not present

## 2023-04-03 LAB — BMP8+EGFR
BUN/Creatinine Ratio: 16 (ref 9–23)
BUN: 12 mg/dL (ref 6–24)
CO2: 21 mmol/L (ref 20–29)
Calcium: 10.9 mg/dL — ABNORMAL HIGH (ref 8.7–10.2)
Chloride: 102 mmol/L (ref 96–106)
Creatinine, Ser: 0.73 mg/dL (ref 0.57–1.00)
Glucose: 105 mg/dL — ABNORMAL HIGH (ref 70–99)
Potassium: 4.2 mmol/L (ref 3.5–5.2)
Sodium: 142 mmol/L (ref 134–144)
eGFR: 97 mL/min/{1.73_m2} (ref >59)

## 2023-04-03 MED ORDER — HYDROCHLOROTHIAZIDE 25 MG PO TABS
25.0000 mg | ORAL_TABLET | Freq: Every day | ORAL | 3 refills | Status: DC
Start: 2023-04-03 — End: 2023-11-14

## 2023-04-03 NOTE — Progress Notes (Signed)
Subjective:  Patient ID: Tammy Bauer, female    DOB: October 25, 1966, 57 y.o.   MRN: [id number]  Patient Care Team: [name], FNP as PCP - General (Family Medicine)   Chief Complaint:  ER follow up  (03/20/2023 (1 hours)/[facility] at [facility]- Hypertensive urgency)   HPI: Tammy Bauer is a 57 y.o. female presenting on 04/03/2023 for ER follow up  (03/20/2023 (1 hours)/[facility] at [facility]- Hypertensive urgency)   Pt presents today for ED follow up. She was seen 03/20/2023 for hypertensive urgency. Workup during ED visit was unremarkable. She was prescribed clonidine 0.1 mg to take as needed. States she has only taken this once or twice. She denies chest pain, headaches, leg swelling, weakness, confusion, or syncope.      Relevant past medical, surgical, family, and social history reviewed and updated as indicated.  Allergies and medications reviewed and updated. Data reviewed: Chart in Epic.   Past Medical History:  Diagnosis Date   Anemia    Diabetes mellitus without complication (HCC)    Hypertension    Stroke [facility])    x2; no deficits   Thyroid nodule 10/02/2017    Past Surgical History:  Procedure Laterality Date   ENDOMETRIAL ABLATION N/A 01/30/2018   Procedure: ENDOMETRIAL ABLATION WITH MINERVA;  Surgeon: Lazaro Arms, MD;  Location: [facility];  Service: Gynecology;  Laterality: N/A;   HYSTEROSCOPY WITH D & C N/A 01/30/2018   Procedure: DILATATION AND CURETTAGE /HYSTEROSCOPY;  Surgeon: Lazaro Arms, MD;  Location: [facility];  Service: Gynecology;  Laterality: N/A;   LOOP RECORDER INSERTION N/A 06/27/2017   Procedure: Loop Recorder Insertion;  Surgeon: Regan Lemming, MD;  Location: [facility];  Service: Cardiovascular;  Laterality: N/A;   NERVE, TENDON AND ARTERY REPAIR Left 10/21/2019   Procedure: WOUND EXPLORATION LEFT HAND WITH REPAIR TENDON, ARTERY AND NERVE ;  Surgeon: Betha Loa, MD;   Location: [facility];  Service: Orthopedics;  Laterality: Left;   TEE WITHOUT CARDIOVERSION N/A 06/27/2017   Procedure: TRANSESOPHAGEAL ECHOCARDIOGRAM (TEE);  Surgeon: [name], MD;  Location: [facility] ENDOSCOPY;  Service: Cardiovascular;  Laterality: N/A;    Social History   Socioeconomic History   Marital status: Married    Spouse name: Not on file   Number of children: Not on file   Years of education: Not on file   Highest education level: Not on file  Occupational History   Not on file  Tobacco Use   Smoking status: Every Day    Packs/day: 0.50    Years: 20.00    Additional pack years: 0.00    Total pack years: 10.00    Types: Cigarettes    Start date: 08/29/1999   Smokeless tobacco: Never   Tobacco comments:    smoke 5 cigarettes a day  Vaping Use   Vaping Use: Never used  Substance and Sexual Activity   Alcohol use: No   Drug use: No   Sexual activity: Not Currently    Birth control/protection: None  Other Topics Concern   Not on file  Social History Narrative   Not on file   Social Determinants of Health   Financial Resource Strain: Not on file  Food Insecurity: Not on file  Transportation Needs: Not on file  Physical Activity: Not on file  Stress: Not on file  Social Connections: Not on file  Intimate Partner Violence: Not on file  Outpatient Encounter Medications as of 04/03/2023  Medication Sig   acetaminophen (TYLENOL) 325 MG tablet Take 650 mg by mouth every 6 (six) hours as needed for mild pain or moderate pain.    amLODipine (NORVASC) 10 MG tablet TAKE 1 TABLET (10 MG TOTAL) BY MOUTH DAILY.   atorvastatin (LIPITOR) 80 MG tablet Take 1 tablet (80 mg total) by mouth at bedtime.   cloNIDine (CATAPRES) 0.1 MG tablet Take 1 tablet (0.1 mg total) by mouth every 8 (eight) hours as needed.   clopidogrel (PLAVIX) 75 MG tablet Take 1 tablet (75 mg total) by mouth daily.   Continuous Glucose Sensor (DEXCOM G7 SENSOR) MISC one sensor every 10  days for 30 days   fish oil-omega-3 fatty acids 1000 MG capsule Take 2 capsules (2 g total) by mouth daily.   glipiZIDE (GLUCOTROL) 10 MG tablet TAKE 1 TABLET (10 MG TOTAL) BY MOUTH 2 (TWO) TIMES DAILY BEFORE A MEAL.   hydrochlorothiazide (HYDRODIURIL) 25 MG tablet Take 1 tablet (25 mg total) by mouth daily.   insulin glargine-yfgn (SEMGLEE, YFGN,) 100 UNIT/ML injection Inject 0.3 mLs (30 Units total) into the skin 2 (two) times daily.   insulin lispro (HUMALOG KWIKPEN) 200 UNIT/ML KwikPen Inject 2-10 Units into the skin in the morning, at noon, and at bedtime.   INSULIN SYRINGE 1CC/29G (B-D INSULIN SYRINGE) 29G X 1/2" 1 ML MISC 1 application by Does not apply route at bedtime.   losartan (COZAAR) 100 MG tablet Take 1 tablet (100 mg total) by mouth daily.   No facility-administered encounter medications on file as of 04/03/2023.    Allergies  Allergen Reactions   Ace Inhibitors Cough    Review of Systems  Constitutional:  Negative for activity change, appetite change, chills, fatigue and fever.  HENT: Negative.    Eyes: Negative.   Respiratory:  Negative for cough, chest tightness and shortness of breath.   Cardiovascular:  Negative for chest pain, palpitations and leg swelling.  Gastrointestinal:  Negative for blood in stool, constipation, diarrhea, nausea and vomiting.  Endocrine: Negative.   Genitourinary:  Negative for dysuria, frequency and urgency.  Musculoskeletal:  Negative for arthralgias and myalgias.  Skin: Negative.   Allergic/Immunologic: Negative.   Neurological:  Negative for dizziness and headaches.  Hematological: Negative.   Psychiatric/Behavioral:  Negative for confusion, hallucinations, sleep disturbance and suicidal ideas.   All other systems reviewed and are negative.       Objective:  BP (!) 160/95   Pulse 89   Temp (!) 97.5 F (36.4 C) (Temporal)   Ht 5\' 2"  (1.575 m)   Wt 121 lb (54.9 kg)   SpO2 100%   BMI 22.13 kg/m    Wt Readings from Last 3  Encounters:  04/03/23 121 lb (54.9 kg)  03/20/23 120 lb (54.4 kg)  03/01/23 120 lb 3.2 oz (54.5 kg)    Physical Exam Vitals and nursing note reviewed.  Constitutional:      General: She is not in acute distress.    Appearance: Normal appearance. She is well-developed, well-groomed and normal weight. She is not ill-appearing, toxic-appearing or diaphoretic.  HENT:     Head: Normocephalic and atraumatic.     Jaw: There is normal jaw occlusion.     Right Ear: Hearing normal.     Left Ear: Hearing normal.     Nose: Nose normal.     Mouth/Throat:     Lips: Pink.     Mouth: Mucous membranes are moist.  Pharynx: Oropharynx is clear. Uvula midline.  Eyes:     General: Lids are normal.     Extraocular Movements: Extraocular movements intact.     Conjunctiva/sclera: Conjunctivae normal.     Pupils: Pupils are equal, round, and reactive to light.  Neck:     Thyroid: Thyromegaly present.     Vascular: No carotid bruit or JVD.     Trachea: Trachea and phonation normal.  Cardiovascular:     Rate and Rhythm: Normal rate and regular rhythm.     Chest Wall: PMI is not displaced.     Pulses: Normal pulses.     Heart sounds: Normal heart sounds. No murmur heard.    No friction rub. No gallop.  Pulmonary:     Effort: Pulmonary effort is normal. No respiratory distress.     Breath sounds: Normal breath sounds. No wheezing.  Abdominal:     General: There is no abdominal bruit.     Palpations: There is no hepatomegaly or splenomegaly.     Tenderness: There is no right CVA tenderness.  Musculoskeletal:        General: Normal range of motion.     Cervical back: Normal range of motion and neck supple.     Right lower leg: No edema.     Left lower leg: No edema.  Lymphadenopathy:     Cervical: No cervical adenopathy.  Skin:    General: Skin is warm and dry.     Capillary Refill: Capillary refill takes less than 2 seconds.     Coloration: Skin is not cyanotic, jaundiced or pale.      Findings: No rash.  Neurological:     General: No focal deficit present.     Mental Status: She is alert and oriented to person, place, and time.     Sensory: Sensation is intact.     Motor: Motor function is intact.     Coordination: Coordination is intact.     Gait: Gait is intact.     Deep Tendon Reflexes: Reflexes are normal and symmetric.  Psychiatric:        Attention and Perception: Attention and perception normal.        Mood and Affect: Mood and affect normal.        Speech: Speech normal.        Behavior: Behavior normal. Behavior is cooperative.        Thought Content: Thought content normal.        Cognition and Memory: Cognition and memory normal.        Judgment: Judgment normal.     Results for orders placed or performed during the hospital encounter of 03/20/23  Basic metabolic panel  Result Value Ref Range   Sodium 131 (L) 135 - 145 mmol/L   Potassium 3.7 3.5 - 5.1 mmol/L   Chloride 104 98 - 111 mmol/L   CO2 26 22 - 32 mmol/L   Glucose, Bld 313 (H) 70 - 99 mg/dL   BUN 14 6 - 20 mg/dL   Creatinine, Ser 1.61 0.44 - 1.00 mg/dL   Calcium 9.4 8.9 - 09.6 mg/dL   GFR, Estimated >04 >54 mL/min   Anion gap 1 (L) 5 - 15  CBC with Differential  Result Value Ref Range   WBC 6.0 4.0 - 10.5 K/uL   RBC 5.08 3.87 - 5.11 MIL/uL   Hemoglobin 16.7 (H) 12.0 - 15.0 g/dL   HCT 09.8 (H) 11.9 - 14.7 %   MCV 96.5 80.0 -  100.0 fL   MCH 32.9 26.0 - 34.0 pg   MCHC 34.1 30.0 - 36.0 g/dL   RDW 54.0 98.1 - 19.1 %   Platelets 344 150 - 400 K/uL   nRBC 0.0 0.0 - 0.2 %   Neutrophils Relative % 55 %   Neutro Abs 3.3 1.7 - 7.7 K/uL   Lymphocytes Relative 34 %   Lymphs Abs 2.1 0.7 - 4.0 K/uL   Monocytes Relative 9 %   Monocytes Absolute 0.5 0.1 - 1.0 K/uL   Eosinophils Relative 1 %   Eosinophils Absolute 0.1 0.0 - 0.5 K/uL   Basophils Relative 1 %   Basophils Absolute 0.0 0.0 - 0.1 K/uL   Immature Granulocytes 0 %   Abs Immature Granulocytes 0.02 0.00 - 0.07 K/uL        Pertinent labs & imaging results that were available during my care of the patient were reviewed by me and considered in my medical decision making.  Assessment & Plan:  Rhiannan was seen today for er follow up .  Diagnoses and all orders for this visit:  Hypertension associated with diabetes (HCC) BP remains elevated. Will add HCTZ to regimen. DASH diet and exercise discussed. Will repeat BMP today. Follow up in 2 weeks for reevaluation and repeat labs.  -     hydrochlorothiazide (HYDRODIURIL) 25 MG tablet; Take 1 tablet (25 mg total) by mouth daily. -     BMP8+EGFR     Continue all other maintenance medications.  Follow up plan: Return in about 2 weeks (around 04/17/2023), or if symptoms worsen or fail to improve, for HTN, BMP.   Continue healthy lifestyle choices, including diet (rich in fruits, vegetables, and lean proteins, and low in salt and simple carbohydrates) and exercise (at least 30 minutes of moderate physical activity daily).  Educational handout given for DASH diet, HTN  The above assessment and management plan was discussed with the patient. The patient verbalized understanding of and has agreed to the management plan. Patient is aware to call the clinic if they develop any new symptoms or if symptoms persist or worsen. Patient is aware when to return to the clinic for a follow-up visit. Patient educated on when it is appropriate to go to the emergency department.   Kari Baars, FNP-C [facility] [phone]

## 2023-04-04 NOTE — Addendum Note (Signed)
Addended by: [name] on: 04/04/2023 07:54 AM   Modules accepted: Orders

## 2023-04-05 ENCOUNTER — Other Ambulatory Visit: Payer: PRIVATE HEALTH INSURANCE

## 2023-04-05 DIAGNOSIS — I152 Hypertension secondary to endocrine disorders: Secondary | ICD-10-CM

## 2023-04-06 LAB — CALCIUM, IONIZED: Calcium, Ion: 5.3 mg/dL (ref 4.5–5.6)

## 2023-04-06 LAB — PTH, INTACT AND CALCIUM
Calcium: 10.6 mg/dL — ABNORMAL HIGH (ref 8.7–10.2)
PTH: 32 pg/mL (ref 15–65)

## 2023-04-25 ENCOUNTER — Ambulatory Visit: Payer: PRIVATE HEALTH INSURANCE | Admitting: Family Medicine

## 2023-07-04 ENCOUNTER — Telehealth: Payer: Self-pay | Admitting: Family Medicine

## 2023-07-13 ENCOUNTER — Other Ambulatory Visit: Payer: Managed Care, Other (non HMO)

## 2023-07-27 ENCOUNTER — Other Ambulatory Visit: Payer: Self-pay | Admitting: Family Medicine

## 2023-07-27 DIAGNOSIS — I10 Essential (primary) hypertension: Secondary | ICD-10-CM

## 2023-07-27 DIAGNOSIS — Z760 Encounter for issue of repeat prescription: Secondary | ICD-10-CM

## 2023-08-21 ENCOUNTER — Telehealth: Payer: Self-pay

## 2023-08-21 NOTE — Telephone Encounter (Signed)
Pt will call back with blood pressure readings

## 2023-11-14 ENCOUNTER — Ambulatory Visit (INDEPENDENT_AMBULATORY_CARE_PROVIDER_SITE_OTHER): Payer: Managed Care, Other (non HMO) | Admitting: Nurse Practitioner

## 2023-11-14 ENCOUNTER — Encounter: Payer: Self-pay | Admitting: Nurse Practitioner

## 2023-11-14 ENCOUNTER — Telehealth: Payer: Self-pay | Admitting: Family Medicine

## 2023-11-14 VITALS — BP 168/91 | HR 52 | Temp 97.7°F | Ht 62.0 in | Wt 115.0 lb

## 2023-11-14 DIAGNOSIS — E1159 Type 2 diabetes mellitus with other circulatory complications: Secondary | ICD-10-CM

## 2023-11-14 DIAGNOSIS — I152 Hypertension secondary to endocrine disorders: Secondary | ICD-10-CM | POA: Diagnosis not present

## 2023-11-14 DIAGNOSIS — Z794 Long term (current) use of insulin: Secondary | ICD-10-CM | POA: Diagnosis not present

## 2023-11-14 DIAGNOSIS — I10 Essential (primary) hypertension: Secondary | ICD-10-CM | POA: Diagnosis not present

## 2023-11-14 LAB — HM DIABETES EYE EXAM

## 2023-11-14 MED ORDER — LOSARTAN POTASSIUM-HCTZ 100-25 MG PO TABS
1.0000 | ORAL_TABLET | Freq: Every day | ORAL | 0 refills | Status: DC
Start: 2023-11-14 — End: 2024-09-08

## 2023-11-14 NOTE — Telephone Encounter (Signed)
Tammy Bauer arrived 11/14/2023 and has given verbal consent to obtain images and complete their overdue diabetic retinal screening.  The images have been sent to an ophthalmologist or optometrist for review and interpretation.  Results will be sent back to [name], FNP for review.  Patient has been informed they will be contacted when we receive the results via telephone or MyChart

## 2023-11-14 NOTE — Progress Notes (Unsigned)
Tammy Bauer arrived 11/14/2023 and has given verbal consent to obtain images and complete their overdue diabetic retinal screening.  The images have been sent to an ophthalmologist or optometrist for review and interpretation.  Results will be sent back to Tammy Masters, FNP for review.  Patient has been informed they will be contacted when we receive the results via telephone or MyChart

## 2023-11-14 NOTE — Progress Notes (Unsigned)
Acute Office Visit  Subjective:     Patient ID: Tammy Bauer, female    DOB: Apr 04, 1966, 57 y.o.   MRN: [id number]  Chief Complaint  Patient presents with   Hypertension    Elevated blood pressure    HPI Tammy Bauer is a 57 yrs old female here today 11/14/23 for BP check. She has a dx of HTN currently being managed with Amlodopine 10 mg, Losartan 100 mg and hydrochlorothiazide 25 mg daily. Reports " I only been taking Amlodipine and Losartan, never got hydrochlorothiazide". Reports check BP at home may be weekly and has been around 150-160/86-90 it has been like that for years. She reports general high sodium diet. Deneis Ha, Chest pain,   Diabetes Mellitus Type II, Follow-up Type 2 diabetes mellitus with other circulatory complication, with long-term current use of insulin (HCC) She has been taking Glipizide 10 mg twice daily and Semglee twice daily. On sliding scale insulin  Denies  polyuria, polyphagia, or polydipsia. States she has lost significant weight over the last several months.Denies night sweats, heat intolerance Lab Results  Component Value Date   HGBA1C 14.0 (H) 03/01/2023   HGBA1C 12.5 (H) 12/01/2022   HGBA1C 12.5 (A) 05/25/2021   Wt Readings from Last 3 Encounters:  11/14/23 115 lb (52.2 kg)  04/03/23 121 lb (54.9 kg)  03/20/23 120 lb (54.4 kg)   Pertinent Labs: Lab Results  Component Value Date   CHOL 228 (H) 03/01/2023   HDL 45 03/01/2023   LDLCALC 139 (H) 03/01/2023   LDLDIRECT 105.1 (H) 05/22/2019   TRIG 245 (H) 03/01/2023   CHOLHDL 5.1 (H) 03/01/2023   Lab Results  Component Value Date   NA 142 04/03/2023   K 4.2 04/03/2023   CREATININE 0.73 04/03/2023   EGFR 97 04/03/2023   MICRALBCREAT 96 (H) 12/01/2022     ---------------------------------------------------------------------------------------------------  Active Ambulatory Problems    Diagnosis Date Noted   Hypertension associated with diabetes (HCC) 10/07/2015   Hyperglycemia 10/07/2015    Acute CVA (cerebrovascular accident) (HCC) 06/25/2017   Hypertensive emergency 06/25/2017   Tobacco abuse 06/25/2017   Acute ischemic stroke (HCC) 10/02/2017   DM2 (diabetes mellitus, type 2) (HCC) 10/02/2017   Vaginal bleeding 01/03/2018   Acute blood loss anemia 01/03/2018   Anemia 01/03/2018   TIA (transient ischemic attack) 05/21/2019   Diplopia 05/22/2019   Headache 05/22/2019   Hyperlipidemia associated with type 2 diabetes mellitus (HCC) 05/22/2019   Pneumonia due to COVID-19 virus 06/20/2020   Dehydration 06/20/2020   Hyponatremia 06/20/2020   Acute respiratory failure with hypoxia (HCC) 06/20/2020   Resolved Ambulatory Problems    Diagnosis Date Noted   CVA (cerebral vascular accident) (HCC) 06/25/2017   Past Medical History:  Diagnosis Date   Diabetes mellitus without complication (HCC)    Hypertension    Stroke (HCC)    Thyroid nodule 10/02/2017    Review of Systems  Constitutional:  Negative for chills and fever.  HENT:  Negative for congestion and sore throat.   Respiratory:  Negative for cough and shortness of breath.   Cardiovascular:  Negative for chest pain and leg swelling.  Gastrointestinal:  Negative for diarrhea, nausea and vomiting.  Skin:  Negative for itching and rash.  Neurological:  Negative for dizziness, tingling, weakness and headaches.  Endo/Heme/Allergies:  Negative for environmental allergies and polydipsia. Does not bruise/bleed easily.   Negative unless indicated in HPI    Objective:    BP (!) 168/91   Pulse (!) 52  Temp 97.7 F (36.5 C) (Temporal)   Ht 5\' 2"  (1.575 m)   Wt 115 lb (52.2 kg)   SpO2 98%   BMI 21.03 kg/m  BP Readings from Last 3 Encounters:  11/14/23 (!) 168/91  04/03/23 (!) 160/95  03/20/23 124/81   Wt Readings from Last 3 Encounters:  11/14/23 115 lb (52.2 kg)  04/03/23 121 lb (54.9 kg)  03/20/23 120 lb (54.4 kg)      Physical Exam Vitals and nursing note reviewed.  Constitutional:      Appearance:  Normal appearance.  HENT:     Head: Normocephalic and atraumatic.  Eyes:     General: No scleral icterus.    Extraocular Movements: Extraocular movements intact.     Pupils: Pupils are equal, round, and reactive to light.  Cardiovascular:     Rate and Rhythm: Normal rate and regular rhythm.  Pulmonary:     Effort: Pulmonary effort is normal.     Breath sounds: Normal breath sounds.  Abdominal:     General: Bowel sounds are normal.     Palpations: Abdomen is soft.  Musculoskeletal:        General: Normal range of motion.     Right lower leg: No edema.     Left lower leg: No edema.  Skin:    General: Skin is warm and dry.     Findings: No rash.  Neurological:     Mental Status: She is alert and oriented to person, place, and time. Mental status is at baseline.  Psychiatric:        Mood and Affect: Mood normal.        Behavior: Behavior normal.        Thought Content: Thought content normal.        Judgment: Judgment normal.     No results found for any visits on 11/14/23.      Assessment & Plan:  Hypertension associated with diabetes (HCC) -     Losartan Potassium-HCTZ; Take 1 tablet by mouth daily.  Dispense: 90 tablet; Refill: 0  Essential hypertension -     Losartan Potassium-HCTZ; Take 1 tablet by mouth daily.  Dispense: 90 tablet; Refill: 39  Tammy Bauer 57 year old African-American female seen today for blood pressure check BP not at goal client has not been compliant with medication.  Plan to DC clonidine, HCTZ since client was not taking it, and losartan  Continue amlodipine 10 mg daily and Hyzaar 125 mg daily: BP log provided to inquire instructed to check BP twice a day and to come in 1 week for nurse visit for BP check  Continue all previously prescribed medication  Continue healthy lifestyle choices, including diet (rich in fruits, vegetables, and lean proteins, and low in salt and simple carbohydrates) and exercise (at least 30 minutes of moderate physical activity  daily).  Goal BP:  For patients younger than 60: Goal BP < 140/90. For patients 60 and older: Goal BP < 150/90. For patients with diabetes: Goal BP < 140/90.  Take your medications faithfully as prescribed. Maintain a healthy weight. Get at least 150 minutes of aerobic exercise per week. Minimize salt intake, less than 2000 mg per day. Minimize alcohol intake.  DASH Eating Plan DASH stands for "Dietary Approaches to Stop Hypertension." The DASH eating plan is a healthy eating plan that has been shown to reduce high blood pressure (hypertension). Additional health benefits may include reducing the risk of type 2 diabetes mellitus, heart disease, and stroke. The DASH eating  plan may also help with weight loss.  WHAT DO I NEED TO KNOW ABOUT THE DASH EATING PLAN? For the DASH eating plan, you will follow these general guidelines: Choose foods with a percent daily value for sodium of less than 5% (as listed on the food label). Use salt-free seasonings or herbs instead of table salt or sea salt. Check with your health care provider or pharmacist before using salt substitutes. Eat lower-sodium products, often labeled as "lower sodium" or "no salt added." Eat fresh foods. Eat more vegetables, fruits, and low-fat dairy products. Choose whole grains. Look for the word "whole" as the first word in the ingredient list. Choose fish and skinless chicken or [place] more often than red meat. Limit fish, poultry, and meat to 6 oz (170 g) each day. Limit sweets, desserts, sugars, and sugary drinks. Choose heart-healthy fats. Limit cheese to 1 oz (28 g) per day. Eat more home-cooked food and less restaurant, buffet, and fast food. Limit fried foods. Cook foods using methods other than frying. Limit canned vegetables. If you do use them, rinse them well to decrease the sodium. When eating at a restaurant, ask that your food be prepared with less salt, or no salt if possible.  WHAT FOODS CAN I EAT? Seek  help from a dietitian for individual calorie needs.  Grains Whole grain or whole wheat bread. Brown rice. Whole grain or whole wheat pasta. Quinoa, bulgur, and whole grain cereals. Low-sodium cereals. Corn or whole wheat flour tortillas. Whole grain cornbread. Whole grain crackers. Low-sodium crackers.  Vegetables Fresh or frozen vegetables (raw, steamed, roasted, or grilled). Low-sodium or reduced-sodium tomato and vegetable juices. Low-sodium or reduced-sodium tomato sauce and paste. Low-sodium or reduced-sodium canned vegetables.   Fruits All fresh, canned (in natural juice), or frozen fruits.  Meat and Other Protein Products Ground beef (85% or leaner), grass-fed beef, or beef trimmed of fat. Skinless chicken or [place]. Ground chicken or [place]. Pork trimmed of fat. All fish and seafood. Eggs. Dried beans, peas, or lentils. Unsalted nuts and seeds. Unsalted canned beans.  Dairy Low-fat dairy products, such as skim or 1% milk, 2% or reduced-fat cheeses, low-fat ricotta or cottage cheese, or plain low-fat yogurt. Low-sodium or reduced-sodium cheeses.  Fats and Oils Tub margarines without trans fats. Light or reduced-fat mayonnaise and salad dressings (reduced sodium). Avocado. Safflower, olive, or canola oils. Natural peanut or almond butter.  Other Unsalted popcorn and pretzels. The items listed above may not be a complete list of recommended foods or beverages. Contact your dietitian for more options.  WHAT FOODS ARE NOT RECOMMENDED?  Grains White bread. White pasta. White rice. Refined cornbread. Bagels and croissants. Crackers that contain trans fat.  Vegetables Creamed or fried vegetables. Vegetables in a cheese sauce. Regular canned vegetables. Regular canned tomato sauce and paste. Regular tomato and vegetable juices.  Fruits Dried fruits. Canned fruit in light or heavy syrup. Fruit juice.  Meat and Other Protein Products Fatty cuts of meat. Ribs, chicken wings, bacon,  sausage, bologna, salami, chitterlings, fatback, hot dogs, bratwurst, and packaged luncheon meats. Salted nuts and seeds. Canned beans with salt.  Dairy Whole or 2% milk, cream, half-and-half, and cream cheese. Whole-fat or sweetened yogurt. Full-fat cheeses or blue cheese. Nondairy creamers and whipped toppings. Processed cheese, cheese spreads, or cheese curds.  Condiments Onion and garlic salt, seasoned salt, table salt, and sea salt. Canned and packaged gravies. Worcestershire sauce. Tartar sauce. Barbecue sauce. Teriyaki sauce. Soy sauce, including reduced sodium. Steak sauce. Fish sauce.  Oyster sauce. Cocktail sauce. Horseradish. Ketchup and mustard. Meat flavorings and tenderizers. Bouillon cubes. Hot sauce. Tabasco sauce. Marinades. Taco seasonings. Relishes.  Fats and Oils Butter, stick margarine, lard, shortening, ghee, and bacon fat. Coconut, palm kernel, or palm oils. Regular salad dressings.  Other Pickles and olives. Salted popcorn and pretzels.  The items listed above may not be a complete list of foods and beverages to avoid. Contact your dietitian for more information.  WHERE CAN I FIND MORE INFORMATION? National Heart, Lung, and Blood Institute: [employer] Document Released: 11/09/2011 Document Revised: 04/06/2014 Document Reviewed: 09/24/2013 [facility] Patient Information 2015 [place], [place]. This information is not intended to replace advice given to you by your health care provider. Make sure you discuss any questions you have with your health care provider.   I think that you would greatly benefit from seeing a nutritionist.  If you are interested, please call [address] [place], [place] [phone] to schedule an appointment.    The above assessment and management plan was discussed with the patient. The patient verbalized understanding of and has agreed to the management plan. Patient is aware to call the clinic if they  develop any new symptoms or if symptoms persist or worsen. Patient is aware when to return to the clinic for a follow-up visit. Patient educated on when it is appropriate to go to the emergency department.  Return in about 4 weeks (around 12/12/2023) for HTN.  Arrie Aran [place], [place] [facility] [address] [place], [place] [address] [phone]  Note: This document was prepared by [employer] voice dictation technology and any errors that results from this process are unintentional.

## 2023-11-14 NOTE — Patient Instructions (Signed)
Goal BP:  For patients younger than 60: Goal BP < 140/90. For patients 60 and older: Goal BP < 150/90. For patients with diabetes: Goal BP < 140/90.  Take your medications faithfully as prescribed. Maintain a healthy weight. Get at least 150 minutes of aerobic exercise per week. Minimize salt intake, less than 2000 mg per day. Minimize alcohol intake.  DASH Eating Plan DASH stands for "Dietary Approaches to Stop Hypertension." The DASH eating plan is a healthy eating plan that has been shown to reduce high blood pressure (hypertension). Additional health benefits may include reducing the risk of type 2 diabetes mellitus, heart disease, and stroke. The DASH eating plan may also help with weight loss.  WHAT DO I NEED TO KNOW ABOUT THE DASH EATING PLAN? For the DASH eating plan, you will follow these general guidelines: Choose foods with a percent daily value for sodium of less than 5% (as listed on the food label). Use salt-free seasonings or herbs instead of table salt or sea salt. Check with your health care provider or pharmacist before using salt substitutes. Eat lower-sodium products, often labeled as "lower sodium" or "no salt added." Eat fresh foods. Eat more vegetables, fruits, and low-fat dairy products. Choose whole grains. Look for the word "whole" as the first word in the ingredient list. Choose fish and skinless chicken or [place] more often than red meat. Limit fish, poultry, and meat to 6 oz (170 g) each day. Limit sweets, desserts, sugars, and sugary drinks. Choose heart-healthy fats. Limit cheese to 1 oz (28 g) per day. Eat more home-cooked food and less restaurant, buffet, and fast food. Limit fried foods. Cook foods using methods other than frying. Limit canned vegetables. If you do use them, rinse them well to decrease the sodium. When eating at a restaurant, ask that your food be prepared with less salt, or no salt if possible.  WHAT FOODS CAN I EAT? Seek help from  a dietitian for individual calorie needs.  Grains Whole grain or whole wheat bread. Brown rice. Whole grain or whole wheat pasta. Quinoa, bulgur, and whole grain cereals. Low-sodium cereals. Corn or whole wheat flour tortillas. Whole grain cornbread. Whole grain crackers. Low-sodium crackers.  Vegetables Fresh or frozen vegetables (raw, steamed, roasted, or grilled). Low-sodium or reduced-sodium tomato and vegetable juices. Low-sodium or reduced-sodium tomato sauce and paste. Low-sodium or reduced-sodium canned vegetables.   Fruits All fresh, canned (in natural juice), or frozen fruits.  Meat and Other Protein Products Ground beef (85% or leaner), grass-fed beef, or beef trimmed of fat. Skinless chicken or [place]. Ground chicken or [place]. Pork trimmed of fat. All fish and seafood. Eggs. Dried beans, peas, or lentils. Unsalted nuts and seeds. Unsalted canned beans.  Dairy Low-fat dairy products, such as skim or 1% milk, 2% or reduced-fat cheeses, low-fat ricotta or cottage cheese, or plain low-fat yogurt. Low-sodium or reduced-sodium cheeses.  Fats and Oils Tub margarines without trans fats. Light or reduced-fat mayonnaise and salad dressings (reduced sodium). Avocado. Safflower, olive, or canola oils. Natural peanut or almond butter.  Other Unsalted popcorn and pretzels. The items listed above may not be a complete list of recommended foods or beverages. Contact your dietitian for more options.  WHAT FOODS ARE NOT RECOMMENDED?  Grains White bread. White pasta. White rice. Refined cornbread. Bagels and croissants. Crackers that contain trans fat.  Vegetables Creamed or fried vegetables. Vegetables in a cheese sauce. Regular canned vegetables. Regular canned tomato sauce and paste. Regular tomato and vegetable juices.  Fruits Dried fruits. Canned fruit in light or heavy syrup. Fruit juice.  Meat and Other Protein Products Fatty cuts of meat. Ribs, chicken wings, bacon, sausage,  bologna, salami, chitterlings, fatback, hot dogs, bratwurst, and packaged luncheon meats. Salted nuts and seeds. Canned beans with salt.  Dairy Whole or 2% milk, cream, half-and-half, and cream cheese. Whole-fat or sweetened yogurt. Full-fat cheeses or blue cheese. Nondairy creamers and whipped toppings. Processed cheese, cheese spreads, or cheese curds.  Condiments Onion and garlic salt, seasoned salt, table salt, and sea salt. Canned and packaged gravies. Worcestershire sauce. Tartar sauce. Barbecue sauce. Teriyaki sauce. Soy sauce, including reduced sodium. Steak sauce. Fish sauce. Oyster sauce. Cocktail sauce. Horseradish. Ketchup and mustard. Meat flavorings and tenderizers. Bouillon cubes. Hot sauce. Tabasco sauce. Marinades. Taco seasonings. Relishes.  Fats and Oils Butter, stick margarine, lard, shortening, ghee, and bacon fat. Coconut, palm kernel, or palm oils. Regular salad dressings.  Other Pickles and olives. Salted popcorn and pretzels.  The items listed above may not be a complete list of foods and beverages to avoid. Contact your dietitian for more information.  WHERE CAN I FIND MORE INFORMATION? National Heart, Lung, and Blood Institute: [employer] Document Released: 11/09/2011 Document Revised: 04/06/2014 Document Reviewed: 09/24/2013 [facility] Patient Information 2015 [place], [place]. This information is not intended to replace advice given to you by your health care provider. Make sure you discuss any questions you have with your health care provider.   I think that you would greatly benefit from seeing a nutritionist.  If you are interested, please call [address] [place], [place] [phone] to schedule an appointment.  The middle of the night to do it and 1 thing I do not want him to get okay okay so I put some information about the diet that I want you to follow-up with colleagues diet.so low-sodium diet you can still eat what  she eats your weight is perfect I just want you to lower with the sodium in your food okay for a limited it if possible

## 2023-11-15 DIAGNOSIS — I10 Essential (primary) hypertension: Secondary | ICD-10-CM | POA: Insufficient documentation

## 2023-11-19 ENCOUNTER — Telehealth: Payer: Self-pay | Admitting: Family Medicine

## 2023-11-19 NOTE — Telephone Encounter (Signed)
Needs an appointment.

## 2023-11-19 NOTE — Telephone Encounter (Signed)
Pt aware. She will try otc and if no better, pt will call back to schedule an apt.

## 2023-11-19 NOTE — Telephone Encounter (Signed)
Copied from CRM [id number]. Topic: Clinical - Medical Advice >> Nov 19, 2023  3:38 PM Tammy Bauer wrote: Reason for CRM: Pt wants know if she can be prescribed something for the pressure built up in her sinuses. 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

## 2023-11-21 ENCOUNTER — Ambulatory Visit: Payer: Managed Care, Other (non HMO)

## 2023-11-21 DIAGNOSIS — Z0130 Encounter for examination of blood pressure without abnormal findings: Secondary | ICD-10-CM

## 2023-11-21 NOTE — Progress Notes (Signed)
Patient is in office today for a nurse visit for Blood Pressure Check. Patient blood pressure was 174/93, Patient No chest pain, No shortness of breath, No dyspnea on exertion, No orthopnea, No paroxysmal nocturnal dyspnea, No edema, No palpitations, No syncope

## 2023-12-06 NOTE — Progress Notes (Deleted)
 Established Patient Office Visit  Subjective  Patient ID: Tammy Bauer, female    DOB: Apr 24, 1966  Age: 58 y.o. MRN: [id number]  No chief complaint on file.   HPI Hypertension, follow-up  BP Readings from Last 3 Encounters:  11/14/23 (!) 168/91  04/03/23 (!) 160/95  03/20/23 124/81   Wt Readings from Last 3 Encounters:  11/14/23 115 lb (52.2 kg)  04/03/23 121 lb (54.9 kg)  03/20/23 120 lb (54.4 kg)     She was last seen for hypertension {NUMBERS 1-12:18279} {days/wks/mos/yrs:310907} ago.  BP at that visit was ***. Management since that visit includes ***.  She reports {excellent/good/fair/poor:19665} compliance with treatment. She {is/is not:9024} having side effects. {document side effects if present:1} She is following a {diet:21022986} diet. She {is/is not:9024} exercising. She {does/does not:200015} smoke.  Use of agents associated with hypertension: {bp agents assoc with hypertension:511::none}.   Outside blood pressures are {***enter patient reported home BP readings, or 'not being checked':1}. Symptoms: {Yes/No:20286} chest pain {Yes/No:20286} chest pressure  {Yes/No:20286} palpitations {Yes/No:20286} syncope  {Yes/No:20286} dyspnea {Yes/No:20286} orthopnea  {Yes/No:20286} paroxysmal nocturnal dyspnea {Yes/No:20286} lower extremity edema   Pertinent labs Lab Results  Component Value Date   CHOL 228 (H) 03/01/2023   HDL 45 03/01/2023   LDLCALC 139 (H) 03/01/2023   LDLDIRECT 105.1 (H) 05/22/2019   TRIG 245 (H) 03/01/2023   CHOLHDL 5.1 (H) 03/01/2023   Lab Results  Component Value Date   NA 142 04/03/2023   K 4.2 04/03/2023   CREATININE 0.73 04/03/2023   EGFR 97 04/03/2023   GLUCOSE 105 (H) 04/03/2023   TSH 1.070 03/01/2023     The ASCVD Risk score (Arnett DK, et al., 2019) failed to calculate for the following reasons:   Risk score cannot be calculated because patient has a medical history suggesting prior/existing  ASCVD  ---------------------------------------------------------------------------------------------------  Patient Active Problem List   Diagnosis Date Noted   Essential hypertension 11/15/2023   Pneumonia due to COVID-19 virus 06/20/2020   Dehydration 06/20/2020   Hyponatremia 06/20/2020   Acute respiratory failure with hypoxia (HCC) 06/20/2020   Diplopia 05/22/2019   Headache 05/22/2019   Hyperlipidemia associated with type 2 diabetes mellitus (HCC) 05/22/2019   TIA (transient ischemic attack) 05/21/2019   Vaginal bleeding 01/03/2018   Acute blood loss anemia 01/03/2018   Anemia 01/03/2018   Acute ischemic stroke (HCC) 10/02/2017   DM2 (diabetes mellitus, type 2) (HCC) 10/02/2017   Acute CVA (cerebrovascular accident) (HCC) 06/25/2017   Hypertensive emergency 06/25/2017   Tobacco abuse 06/25/2017   Hypertension associated with diabetes (HCC) 10/07/2015   Hyperglycemia 10/07/2015   Past Medical History:  Diagnosis Date   Anemia    Diabetes mellitus without complication (HCC)    Hypertension    Stroke (HCC)    x2; no deficits   Thyroid  nodule 10/02/2017   Past Surgical History:  Procedure Laterality Date   ENDOMETRIAL ABLATION N/A 01/30/2018   Procedure: ENDOMETRIAL ABLATION WITH MINERVA;  Surgeon: Jayne Vonn DEL, MD;  Location: [facility];  Service: Gynecology;  Laterality: N/A;   HYSTEROSCOPY WITH D & C N/A 01/30/2018   Procedure: DILATATION AND CURETTAGE /HYSTEROSCOPY;  Surgeon: Jayne Vonn DEL, MD;  Location: [facility];  Service: Gynecology;  Laterality: N/A;   LOOP RECORDER INSERTION N/A 06/27/2017   Procedure: Loop Recorder Insertion;  Surgeon: Inocencio Soyla Lunger, MD;  [facility]: [facility];  Service: Cardiovascular;  Laterality: N/A;   NERVE, TENDON AND ARTERY REPAIR Left 10/21/2019   Procedure: WOUND EXPLORATION LEFT HAND WITH  REPAIR TENDON, ARTERY AND NERVE ;  Surgeon: Murrell Drivers, MD;  Location: [facility];  Service: Orthopedics;  Laterality: Left;    TEE WITHOUT CARDIOVERSION N/A 06/27/2017   Procedure: TRANSESOPHAGEAL ECHOCARDIOGRAM (TEE);  Surgeon: Mona Vinie BROCKS, MD;  Location: [facility];  Service: Cardiovascular;  Laterality: N/A;   Social History   Tobacco Use   Smoking status: Every Day    Current packs/day: 0.50    Average packs/day: 0.5 packs/day for 24.3 years (12.1 ttl pk-yrs)    Types: Cigarettes    Start date: 08/29/1999   Smokeless tobacco: Never   Tobacco comments:    smoke 5 cigarettes a day  Vaping Use   Vaping status: Never Used  Substance Use Topics   Alcohol use: No   Drug use: No   Social History   Socioeconomic History   Marital status: Married    Spouse name: Not on file   Number of children: Not on file   Years of education: Not on file   Highest education level: Not on file  Occupational History   Not on file  Tobacco Use   Smoking status: Every Day    Current packs/day: 0.50    Average packs/day: 0.5 packs/day for 24.3 years (12.1 ttl pk-yrs)    Types: Cigarettes    Start date: 08/29/1999   Smokeless tobacco: Never   Tobacco comments:    smoke 5 cigarettes a day  Vaping Use   Vaping status: Never Used  Substance and Sexual Activity   Alcohol use: No   Drug use: No   Sexual activity: Not Currently    Birth control/protection: None  Other Topics Concern   Not on file  Social History Narrative   Not on file   Social Drivers of Health   Financial Resource Strain: Not on file  Food Insecurity: Not on file  Transportation Needs: Not on file  Physical Activity: Not on file  Stress: Not on file  Social Connections: Not on file  Intimate Partner Violence: Not on file   Family Status  Relation Name Status   Mother  Alive   Father  Alive   Sister 1 Alive   Brother 3 Alive   Daughter 3 Alive   Son 1 Alive   MGM  Deceased   MGF  Deceased   PGM  Deceased   PGF  Deceased  No partnership data on file   Family History  Problem Relation Age of Onset   Hypotension Sister     Heart disease Brother    Stroke Maternal Grandfather    Allergies  Allergen Reactions   Ace Inhibitors Cough      ROS Negative unless indicated in HPI   Objective:     There were no vitals taken for this visit. BP Readings from Last 3 Encounters:  11/14/23 (!) 168/91  04/03/23 (!) 160/95  03/20/23 124/81   Wt Readings from Last 3 Encounters:  11/14/23 115 lb (52.2 kg)  04/03/23 121 lb (54.9 kg)  03/20/23 120 lb (54.4 kg)      Physical Exam   No results found for any visits on 12/12/23.  Last CBC Lab Results  Component Value Date   WBC 6.0 03/20/2023   HGB 16.7 (H) 03/20/2023   HCT 49.0 (H) 03/20/2023   MCV 96.5 03/20/2023   MCH 32.9 03/20/2023   RDW 12.4 03/20/2023   PLT 344 03/20/2023   Last metabolic panel Lab Results  Component Value Date   GLUCOSE 105 (  H) 04/03/2023   NA 142 04/03/2023   K 4.2 04/03/2023   CL 102 04/03/2023   CO2 21 04/03/2023   BUN 12 04/03/2023   CREATININE 0.73 04/03/2023   EGFR 97 04/03/2023   CALCIUM  10.6 (H) 04/05/2023   PHOS 2.9 06/22/2020   PROT 7.3 03/01/2023   ALBUMIN 4.5 03/01/2023   LABGLOB 2.8 03/01/2023   AGRATIO 1.6 03/01/2023   BILITOT 0.4 03/01/2023   ALKPHOS 112 03/01/2023   AST 13 03/01/2023   ALT 16 03/01/2023   ANIONGAP 1 (L) 03/20/2023   Last lipids Lab Results  Component Value Date   CHOL 228 (H) 03/01/2023   HDL 45 03/01/2023   LDLCALC 139 (H) 03/01/2023   LDLDIRECT 105.1 (H) 05/22/2019   TRIG 245 (H) 03/01/2023   CHOLHDL 5.1 (H) 03/01/2023   Last hemoglobin A1c Lab Results  Component Value Date   HGBA1C 14.0 (H) 03/01/2023   Last thyroid  functions Lab Results  Component Value Date   TSH 1.070 03/01/2023   T4TOTAL 9.3 03/01/2023        Assessment & Plan:  There are no diagnoses linked to this encounter.  No follow-ups on file.    @Avaneesh Pepitone  [place], [place]    Note: This document was prepared by [employer] voice dictation technology and any errors that results from this process are  unintentional.

## 2023-12-12 ENCOUNTER — Ambulatory Visit: Payer: Managed Care, Other (non HMO) | Admitting: Nurse Practitioner

## 2024-01-01 ENCOUNTER — Other Ambulatory Visit: Payer: Self-pay | Admitting: Nurse Practitioner

## 2024-01-01 ENCOUNTER — Other Ambulatory Visit: Payer: Self-pay | Admitting: Family Medicine

## 2024-01-01 DIAGNOSIS — I10 Essential (primary) hypertension: Secondary | ICD-10-CM

## 2024-01-01 DIAGNOSIS — Z760 Encounter for issue of repeat prescription: Secondary | ICD-10-CM

## 2024-02-21 ENCOUNTER — Other Ambulatory Visit: Payer: Self-pay | Admitting: Nurse Practitioner

## 2024-02-21 DIAGNOSIS — E040 Nontoxic diffuse goiter: Secondary | ICD-10-CM

## 2024-02-27 ENCOUNTER — Other Ambulatory Visit

## 2024-06-13 ENCOUNTER — Other Ambulatory Visit: Payer: Self-pay | Admitting: Nurse Practitioner

## 2024-06-13 DIAGNOSIS — E040 Nontoxic diffuse goiter: Secondary | ICD-10-CM

## 2024-06-16 ENCOUNTER — Ambulatory Visit
Admission: RE | Admit: 2024-06-16 | Discharge: 2024-06-16 | Disposition: A | Discharge status: Home / Self Care | Source: Ambulatory Visit | Attending: Nurse Practitioner | Admitting: Nurse Practitioner

## 2024-06-16 DIAGNOSIS — E040 Nontoxic diffuse goiter: Secondary | ICD-10-CM

## 2024-06-21 ENCOUNTER — Emergency Department (HOSPITAL_COMMUNITY)

## 2024-06-21 ENCOUNTER — Other Ambulatory Visit: Payer: Self-pay

## 2024-06-21 ENCOUNTER — Inpatient Hospital Stay (HOSPITAL_COMMUNITY)
Admission: EM | Admit: 2024-06-21 | Discharge: 2024-06-23 | DRG: 065 | Disposition: A | Discharge status: Home / Self Care | Attending: Internal Medicine | Admitting: Internal Medicine

## 2024-06-21 DIAGNOSIS — Z823 Family history of stroke: Secondary | ICD-10-CM

## 2024-06-21 DIAGNOSIS — Z794 Long term (current) use of insulin: Secondary | ICD-10-CM

## 2024-06-21 DIAGNOSIS — H538 Other visual disturbances: Principal | ICD-10-CM

## 2024-06-21 DIAGNOSIS — I10 Essential (primary) hypertension: Secondary | ICD-10-CM | POA: Diagnosis present

## 2024-06-21 DIAGNOSIS — E042 Nontoxic multinodular goiter: Secondary | ICD-10-CM | POA: Diagnosis present

## 2024-06-21 DIAGNOSIS — F17210 Nicotine dependence, cigarettes, uncomplicated: Secondary | ICD-10-CM | POA: Diagnosis present

## 2024-06-21 DIAGNOSIS — I6622 Occlusion and stenosis of left posterior cerebral artery: Secondary | ICD-10-CM | POA: Diagnosis present

## 2024-06-21 DIAGNOSIS — K118 Other diseases of salivary glands: Secondary | ICD-10-CM | POA: Diagnosis present

## 2024-06-21 DIAGNOSIS — E785 Hyperlipidemia, unspecified: Secondary | ICD-10-CM | POA: Diagnosis present

## 2024-06-21 DIAGNOSIS — Z716 Tobacco abuse counseling: Secondary | ICD-10-CM

## 2024-06-21 DIAGNOSIS — Z7984 Long term (current) use of oral hypoglycemic drugs: Secondary | ICD-10-CM | POA: Diagnosis not present

## 2024-06-21 DIAGNOSIS — Z7982 Long term (current) use of aspirin: Secondary | ICD-10-CM

## 2024-06-21 DIAGNOSIS — I639 Cerebral infarction, unspecified: Secondary | ICD-10-CM | POA: Diagnosis not present

## 2024-06-21 DIAGNOSIS — R519 Headache, unspecified: Secondary | ICD-10-CM | POA: Diagnosis not present

## 2024-06-21 DIAGNOSIS — Z8249 Family history of ischemic heart disease and other diseases of the circulatory system: Secondary | ICD-10-CM

## 2024-06-21 DIAGNOSIS — E1169 Type 2 diabetes mellitus with other specified complication: Secondary | ICD-10-CM | POA: Diagnosis present

## 2024-06-21 DIAGNOSIS — I671 Cerebral aneurysm, nonruptured: Secondary | ICD-10-CM | POA: Diagnosis present

## 2024-06-21 DIAGNOSIS — I6389 Other cerebral infarction: Secondary | ICD-10-CM | POA: Diagnosis not present

## 2024-06-21 DIAGNOSIS — R9431 Abnormal electrocardiogram [ECG] [EKG]: Secondary | ICD-10-CM | POA: Diagnosis present

## 2024-06-21 DIAGNOSIS — E1159 Type 2 diabetes mellitus with other circulatory complications: Secondary | ICD-10-CM

## 2024-06-21 DIAGNOSIS — Z91148 Patient's other noncompliance with medication regimen for other reason: Secondary | ICD-10-CM

## 2024-06-21 DIAGNOSIS — Z888 Allergy status to other drugs, medicaments and biological substances status: Secondary | ICD-10-CM

## 2024-06-21 DIAGNOSIS — Z760 Encounter for issue of repeat prescription: Secondary | ICD-10-CM

## 2024-06-21 DIAGNOSIS — E1151 Type 2 diabetes mellitus with diabetic peripheral angiopathy without gangrene: Secondary | ICD-10-CM | POA: Diagnosis not present

## 2024-06-21 DIAGNOSIS — Z681 Body mass index (BMI) 19 or less, adult: Secondary | ICD-10-CM | POA: Diagnosis not present

## 2024-06-21 DIAGNOSIS — I63531 Cerebral infarction due to unspecified occlusion or stenosis of right posterior cerebral artery: Principal | ICD-10-CM | POA: Diagnosis present

## 2024-06-21 DIAGNOSIS — Z79899 Other long term (current) drug therapy: Secondary | ICD-10-CM | POA: Diagnosis not present

## 2024-06-21 DIAGNOSIS — I69398 Other sequelae of cerebral infarction: Secondary | ICD-10-CM

## 2024-06-21 DIAGNOSIS — E1152 Type 2 diabetes mellitus with diabetic peripheral angiopathy with gangrene: Secondary | ICD-10-CM | POA: Diagnosis not present

## 2024-06-21 DIAGNOSIS — I779 Disorder of arteries and arterioles, unspecified: Secondary | ICD-10-CM | POA: Diagnosis not present

## 2024-06-21 DIAGNOSIS — E119 Type 2 diabetes mellitus without complications: Secondary | ICD-10-CM

## 2024-06-21 DIAGNOSIS — H532 Diplopia: Secondary | ICD-10-CM | POA: Diagnosis present

## 2024-06-21 DIAGNOSIS — Z7902 Long term (current) use of antithrombotics/antiplatelets: Secondary | ICD-10-CM

## 2024-06-21 DIAGNOSIS — I6523 Occlusion and stenosis of bilateral carotid arteries: Secondary | ICD-10-CM | POA: Diagnosis present

## 2024-06-21 DIAGNOSIS — K116 Mucocele of salivary gland: Secondary | ICD-10-CM

## 2024-06-21 DIAGNOSIS — R636 Underweight: Secondary | ICD-10-CM | POA: Diagnosis present

## 2024-06-21 LAB — CBC WITH DIFFERENTIAL/PLATELET
Abs Immature Granulocytes: 0 K/uL (ref 0.00–0.07)
Basophils Absolute: 0 K/uL (ref 0.0–0.1)
Basophils Relative: 1 %
Eosinophils Absolute: 0 K/uL (ref 0.0–0.5)
Eosinophils Relative: 1 %
HCT: 49.8 % — ABNORMAL HIGH (ref 36.0–46.0)
Hemoglobin: 16.6 g/dL — ABNORMAL HIGH (ref 12.0–15.0)
Immature Granulocytes: 0 %
Lymphocytes Relative: 33 %
Lymphs Abs: 1.8 K/uL (ref 0.7–4.0)
MCH: 31.8 pg (ref 26.0–34.0)
MCHC: 33.3 g/dL (ref 30.0–36.0)
MCV: 95.4 fL (ref 80.0–100.0)
Monocytes Absolute: 0.4 K/uL (ref 0.1–1.0)
Monocytes Relative: 7 %
Neutro Abs: 3.2 K/uL (ref 1.7–7.7)
Neutrophils Relative %: 58 %
Platelets: 369 K/uL (ref 150–400)
RBC: 5.22 MIL/uL — ABNORMAL HIGH (ref 3.87–5.11)
RDW: 13.2 % (ref 11.5–15.5)
WBC: 5.4 K/uL (ref 4.0–10.5)
nRBC: 0 % (ref 0.0–0.2)

## 2024-06-21 LAB — BASIC METABOLIC PANEL WITH GFR
Anion gap: 11 (ref 5–15)
BUN: 17 mg/dL (ref 6–20)
CO2: 26 mmol/L (ref 22–32)
Calcium: 9.8 mg/dL (ref 8.9–10.3)
Chloride: 100 mmol/L (ref 98–111)
Creatinine, Ser: 0.67 mg/dL (ref 0.44–1.00)
GFR, Estimated: >60 mL/min (ref >60)
Glucose, Bld: 272 mg/dL — ABNORMAL HIGH (ref 70–99)
Potassium: 3.5 mmol/L (ref 3.5–5.1)
Sodium: 137 mmol/L (ref 135–145)

## 2024-06-21 LAB — URINALYSIS, ROUTINE W REFLEX MICROSCOPIC
Bacteria, UA: NONE SEEN
Bilirubin Urine: NEGATIVE
Glucose, UA: >=500 mg/dL — AB
Hgb urine dipstick: NEGATIVE
Ketones, ur: NEGATIVE mg/dL
Leukocytes,Ua: NEGATIVE
Nitrite: NEGATIVE
Protein, ur: NEGATIVE mg/dL
Specific Gravity, Urine: 1.032 — ABNORMAL HIGH (ref 1.005–1.030)
pH: 5 (ref 5.0–8.0)

## 2024-06-21 LAB — CBG MONITORING, ED
Glucose-Capillary: 311 mg/dL — ABNORMAL HIGH (ref 70–99)
Glucose-Capillary: 259 mg/dL — ABNORMAL HIGH (ref 70–99)

## 2024-06-21 MED ORDER — IOHEXOL 350 MG/ML SOLN
75.0000 mL | Freq: Once | INTRAVENOUS | Status: AC | PRN
  Administered 2024-06-21: 75 mL via INTRAVENOUS

## 2024-06-21 MED ORDER — HYDROCODONE-ACETAMINOPHEN 5-325 MG PO TABS: 1.0000 | ORAL_TABLET | Freq: Four times a day (QID) | ORAL | Status: DC | PRN

## 2024-06-21 MED ORDER — INSULIN ASPART 100 UNIT/ML IJ SOLN
0.0000 [IU] | Freq: Every day | INTRAMUSCULAR | Status: DC
  Administered 2024-06-21: 3 [IU] via SUBCUTANEOUS
  Filled 2024-06-21: qty 1

## 2024-06-21 MED ORDER — CLOPIDOGREL BISULFATE 75 MG PO TABS
75.0000 mg | ORAL_TABLET | Freq: Once | ORAL | Status: AC
  Administered 2024-06-21: 75 mg via ORAL
  Filled 2024-06-21: qty 1

## 2024-06-21 MED ORDER — CLOPIDOGREL BISULFATE 75 MG PO TABS
75.0000 mg | ORAL_TABLET | Freq: Every day | ORAL | Status: DC
  Administered 2024-06-22 – 2024-06-23 (×2): 75 mg via ORAL
  Filled 2024-06-21 (×2): qty 1

## 2024-06-21 MED ORDER — MECLIZINE HCL 12.5 MG PO TABS
25.0000 mg | ORAL_TABLET | Freq: Once | ORAL | Status: AC
  Administered 2024-06-21: 25 mg via ORAL
  Filled 2024-06-21: qty 2

## 2024-06-21 MED ORDER — INSULIN ASPART 100 UNIT/ML IJ SOLN
0.0000 [IU] | Freq: Three times a day (TID) | INTRAMUSCULAR | Status: DC
  Administered 2024-06-22: 3 [IU] via SUBCUTANEOUS
  Filled 2024-06-21: qty 1

## 2024-06-21 MED ORDER — POTASSIUM CHLORIDE CRYS ER 20 MEQ PO TBCR
40.0000 meq | EXTENDED_RELEASE_TABLET | Freq: Once | ORAL | Status: AC
  Administered 2024-06-21: 40 meq via ORAL
  Filled 2024-06-21: qty 2

## 2024-06-21 MED ORDER — ASPIRIN 81 MG PO TBEC
81.0000 mg | DELAYED_RELEASE_TABLET | Freq: Every day | ORAL | Status: DC
  Administered 2024-06-22 – 2024-06-23 (×2): 81 mg via ORAL
  Filled 2024-06-21 (×2): qty 1

## 2024-06-21 MED ORDER — SODIUM CHLORIDE 0.9 % IV BOLUS
1000.0000 mL | Freq: Once | INTRAVENOUS | Status: AC
  Administered 2024-06-21: 1000 mL via INTRAVENOUS

## 2024-06-21 MED ORDER — ASPIRIN 325 MG PO TBEC
325.0000 mg | DELAYED_RELEASE_TABLET | Freq: Once | ORAL | Status: AC
  Administered 2024-06-21: 325 mg via ORAL
  Filled 2024-06-21: qty 1

## 2024-06-21 NOTE — Assessment & Plan Note (Signed)
 Reports unusual low blood sugar last night-60s. - SSI- M -Resume home long-acting insulin  at reduced dose 15 units nightly(30u home dose) - HgbA1c - Hold home glipizide 

## 2024-06-21 NOTE — H&P (Signed)
 History and Physical    Tammy Bauer FMW:[id number] DOB: [birth year] DOA: 06/21/2024  PCP: Severa Rock HERO, FNP   Patient coming from: Home  I have personally briefly reviewed patient's old medical records in [facility]  Chief Complaint: Blurry vision  HPI: Tammy Bauer is a 58 y.o. female with medical history significant for CVA, HTN, DM.  Patient presented to the ED with complaints of headache to the posterior side of her head radiating down to her right shoulder, and blurry vision associated about 10 AM yesterday.  Symptoms have persisted since onset but have improved in severity.  No facial asymmetry, no change in speech, no weakness of her extremities.  She reports compliance with her Plavix . She has persistent double vision from her prior stroke.  ED Course: Blood pressure systolic 150s to 819d. Head CT- Recent right posterior cerebral artery distribution infarct involving the posterior, inferomedial right temporal lobe and adjacent occipital lobe. Brain MRI-  5.7 x 2.1 cm acute right PCA territory infarct within the temporal and occipital lobes. EDP talked to neurology on-call, recommended admission to [facility], add aspirin  to Plavix , team to see in consult.  Review of Systems: As per HPI all other systems reviewed and negative.  Past Medical History:  Diagnosis Date   Anemia    Diabetes mellitus without complication (HCC)    Hypertension    Stroke [facility])    x2; no deficits   Thyroid  nodule 10/02/2017    Past Surgical History:  Procedure Laterality Date   ENDOMETRIAL ABLATION N/A 01/30/2018   Procedure: ENDOMETRIAL ABLATION WITH MINERVA;  Surgeon: Jayne Vonn DEL, MD;  Location: [facility];  Service: Gynecology;  Laterality: N/A;   HYSTEROSCOPY WITH D & C N/A 01/30/2018   Procedure: DILATATION AND CURETTAGE /HYSTEROSCOPY;  Surgeon: Jayne Vonn DEL, MD;  Location: [facility];  Service: Gynecology;  Laterality: N/A;   LOOP RECORDER INSERTION N/A 06/27/2017   Procedure: Loop Recorder  Insertion;  Surgeon: Inocencio Soyla Lunger, MD;  [facility];  Service: Cardiovascular;  Laterality: N/A;   NERVE, TENDON AND ARTERY REPAIR Left 10/21/2019   Procedure: WOUND EXPLORATION LEFT HAND WITH REPAIR TENDON, ARTERY AND NERVE ;  Surgeon: Murrell Drivers, MD;  Location: [facility];  Service: Orthopedics;  Laterality: Left;   TEE WITHOUT CARDIOVERSION N/A 06/27/2017   Procedure: TRANSESOPHAGEAL ECHOCARDIOGRAM (TEE);  Surgeon: Mona Vinie BROCKS, MD;  Location: [facility] ENDOSCOPY;  Service: Cardiovascular;  Laterality: N/A;     reports that she has been smoking cigarettes. She started smoking about 24 years ago. She has a 12.4 pack-year smoking history. She has never used smokeless tobacco. She reports that she does not drink alcohol and does not use drugs.  Allergies  Allergen Reactions   Ace Inhibitors Cough    Family History  Problem Relation Age of Onset   Hypotension Sister    Heart disease Brother    Stroke Maternal Grandfather     Prior to Admission medications   Medication Sig Start Date End Date Taking? Authorizing Provider  acetaminophen  (TYLENOL ) 325 MG tablet Take 650 mg by mouth every 6 (six) hours as needed for mild pain or moderate pain.     [provider]  amLODipine  (NORVASC ) 10 MG tablet TAKE ONE TABLET BY MOUTH DAILY 01/02/24   Severa Rock HERO, FNP  atorvastatin  (LIPITOR) 80 MG tablet Take 1 tablet (80 mg total) by mouth at bedtime. 12/08/22   Cherylene Homer HERO, NP  clopidogrel  (PLAVIX ) 75 MG tablet TAKE  ONE TABLET DAILY 01/02/24   Severa Rock HERO, FNP  Continuous Glucose Sensor (DEXCOM G7 SENSOR) MISC one sensor every 10 days for 30 days 03/16/23   [provider]  fish oil -omega-3 fatty acids  1000 MG capsule Take 2 capsules (2 g total) by mouth daily. 12/08/22   Ijaola, Onyeje M, NP  glipiZIDE  (GLUCOTROL ) 10 MG tablet TAKE 1 TABLET (10 MG TOTAL) BY MOUTH 2 (TWO) TIMES DAILY BEFORE A MEAL. 12/08/22 12/08/23  Cherylene Homer HERO, NP  insulin   glargine-yfgn (SEMGLEE , YFGN,) 100 UNIT/ML injection Inject 0.3 mLs (30 Units total) into the skin 2 (two) times daily. 03/07/22   Newlin, Enobong, MD  insulin  lispro (HUMALOG KWIKPEN) 200 UNIT/ML KwikPen Inject 2-10 Units into the skin in the morning, at noon, and at bedtime.    [provider]  INSULIN  SYRINGE 1CC/29G (B-D INSULIN  SYRINGE) 29G X 1/2 1 ML MISC 1 application by Does not apply route at bedtime. 06/01/20   Celestia Rosaline SQUIBB, NP  losartan -hydrochlorothiazide  (HYZAAR) 100-25 MG tablet Take 1 tablet by mouth daily. 11/14/23   [facility] Sebastian Pool, NP    Physical Exam: Vitals:   06/21/24 1809 06/21/24 1815 06/21/24 1845 06/21/24 1900  BP:  (!) 186/94 (!) 174/94 (!) 182/91  Pulse:  75 69 68  Resp:  (!) 23 18 18   Temp: 98.1 F (36.7 C)     TempSrc: Oral     SpO2:  95% 99% 98%  Weight:      Height:        Constitutional: NAD, calm, comfortable Vitals:   06/21/24 1809 06/21/24 1815 06/21/24 1845 06/21/24 1900  BP:  (!) 186/94 (!) 174/94 (!) 182/91  Pulse:  75 69 68  Resp:  (!) 23 18 18   Temp: 98.1 F (36.7 C)     TempSrc: Oral     SpO2:  95% 99% 98%  Weight:      Height:       Eyes: PERRL, lids and conjunctivae normal ENMT: Mucous membranes are moist. Neck: normal, supple, no masses, no thyromegaly Respiratory: clear to auscultation bilaterally, no wheezing, no crackles. Normal respiratory effort. No accessory muscle use.  Cardiovascular: Regular rate and rhythm, no murmurs / rubs / gallops. No extremity edema.  Extremities warm. Abdomen: no tenderness, no masses palpated. No hepatosplenomegaly. Bowel sounds positive.  Musculoskeletal: no clubbing / cyanosis. No joint deformity upper and lower extremities. Good ROM, no contractures. Normal muscle tone.  Skin: no rashes, lesions, ulcers. No induration Neurologic: No facial asymmetry, speech fluent without aphasia, peripheral vision intact, able to count fingers bilaterally , but reports subjective blurry  vision, 5/5 strength of bilateral upper and lower extremities, sensation intact globally. Psychiatric: Normal judgment and insight. Alert and oriented x 3. Normal mood.   Labs on Admission: I have personally reviewed following labs and imaging studies  CBC: Recent Labs  Lab 06/21/24 1330  WBC 5.4  NEUTROABS 3.2  HGB 16.6*  HCT 49.8*  MCV 95.4  PLT 369   Basic Metabolic Panel: Recent Labs  Lab 06/21/24 1330  NA 137  K 3.5  CL 100  CO2 26  GLUCOSE 272*  BUN 17  CREATININE 0.67  CALCIUM  9.8   CBG: Recent Labs  Lab 06/21/24 1244  GLUCAP 311*   Urine analysis:    Component Value Date/Time   COLORURINE YELLOW 06/21/2024 1357   APPEARANCEUR CLEAR 06/21/2024 1357   LABSPEC 1.032 (H) 06/21/2024 1357   PHURINE 5.0 06/21/2024 1357   GLUCOSEU >=500 (A) 06/21/2024  1357   HGBUR NEGATIVE 06/21/2024 1357   BILIRUBINUR NEGATIVE 06/21/2024 1357   KETONESUR NEGATIVE 06/21/2024 1357   PROTEINUR NEGATIVE 06/21/2024 1357   UROBILINOGEN 1.0 05/22/2015 2045   NITRITE NEGATIVE 06/21/2024 1357   LEUKOCYTESUR NEGATIVE 06/21/2024 1357    Radiological Exams on Admission: MR BRAIN WO CONTRAST Result Date: 06/21/2024 CLINICAL DATA:  Provided history: Neuro deficit, acute, stroke suspected. Blurry vision. EXAM: MRI HEAD WITHOUT CONTRAST TECHNIQUE: Multiplanar, multiecho pulse sequences of the brain and surrounding structures were obtained without intravenous contrast. COMPARISON:  Head CT 06/21/2024. MRI brain and MRA head 05/22/2019. FINDINGS: Brain: No age-advanced or lobar predominant cerebral atrophy. Acute cortical/subcortical right PCA territory infarct within the temporal and occipital lobes, measuring 5.7 x 2.1 cm (for instance as seen on series 5, image 20). Small chronic cortical infarct within the left occipital lobe (PCA vascular territory), new from the prior MRI of 05/22/2019. Redemonstrated chronic left MCA territory cortical/subcortical infarct within the insula and superior  temporal lobe. Unchanged chronic lacunar infarcts within the left cerebral hemispheric white matter. As before, there is Wallerian degeneration within the callosal body adjacent to a chronic infarct within the left frontal lobe centrum semiovale. Unchanged chronic lacunar infarcts within the thalami. Background moderate for age multifocal T2 FLAIR hyperintense signal abnormality within the cerebral white matter, nonspecific but compatible with chronic small vessel ischemic disease. These findings have these findings are similar to the prior MRI. Few nonspecific chronic microhemorrhages scattered within the supratentorial and infratentorial brain. No evidence of an intracranial mass. No extra-axial fluid collection. No midline shift. Vascular: Maintained flow voids within the proximal large arterial vessels. Skull and upper cervical spine: No focal worrisome marrow lesion. Incompletely assessed cervical spondylosis. Sinuses/Orbits: No mass or acute finding within the imaged orbits. 2.1 cm ovoid T2 hyperintense lesion within the left parotid gland, which has increased in size since the prior MRI. IMPRESSION: 1. 5.7 x 2.1 cm acute right PCA territory infarct within the temporal and occipital lobes. 2. Background chronic small vessel ischemic disease and chronic infarcts, as described. 3. 2.1 cm left parotid gland lesion which has increased in size since the MRI of 05/22/2019. This may reflect a cyst or a primary parotid neoplasm. ENT referral recommended. Electronically Signed   By: Rockey Childs D.O.   On: 06/21/2024 20:13   CT HEAD WO CONTRAST Result Date: 06/21/2024 CLINICAL DATA:  Gradual onset of posterior headache beginning yesterday, with pain radiating to the right shoulder. Blurry vision. EXAM: CT HEAD WITHOUT CONTRAST TECHNIQUE: Contiguous axial images were obtained from the base of the skull through the vertex without intravenous contrast. RADIATION DOSE REDUCTION: This exam was performed according to the  departmental dose-optimization program which includes automated exposure control, adjustment of the mA and/or kV according to patient size and/or use of iterative reconstruction technique. COMPARISON:  Head CT, 03/20/2023. FINDINGS: Brain: There is hypoattenuation along the medial aspect of the posterior right temporal lobe extending to the occipital lobe, involving the overlying cortex, consistent with recent infarction, and new when compared to the prior head CT. Area of encephalomalacia and adjacent hypoattenuation along the superior left temporal lobe is stable consistent with an old infarct. There are additional patchy areas of white matter hypoattenuation, also unchanged, consistent with small vessel ischemic change and probable old lacunar infarcts. Ventricles are normal in size and configuration. There are no parenchymal masses or mass effect. No midline shift. There are no extra-axial masses or abnormal fluid collections. No intracranial hemorrhage. Vascular: No hyperdense vessel or  unexpected calcification. Skull: No fracture or acute finding.  No bone lesion. Sinuses/Orbits: Normal globes and orbits. Visualized sinuses are mostly clear. Other: None. IMPRESSION: 1. Recent right posterior cerebral artery distribution infarct involving the posterior, inferomedial right temporal lobe and adjacent occipital lobe. 2. No other evidence of a recent/acute intracranial abnormality. 3. No intracranial hemorrhage. Electronically Signed   By: Alm Parkins M.D.   On: 06/21/2024 15:39   EKG: Independently reviewed.  Sinus rhythm, rate 87, QTc prolonged 511.  Assessment/Plan Principal Problem:   Acute ischemic stroke (HCC) Active Problems:   DM2 (diabetes mellitus, type 2) (HCC)   Headache   Hyperlipidemia associated with type 2 diabetes mellitus (HCC)   Essential hypertension  Assessment and Plan: * Acute ischemic stroke [facility]) Presenting with bilateral blurry vision, and posterior headache-both improving  in severity.  MRI brain - 5.7 x 2.1 cm acute right PCA territory infarct within the temporal and occipital lobes.  History of prior stroke.  She reports compliance with Plavix . -EDP talked to neurology, admit to [facility], team will see in consult, add aspirin  to plavix . - CTA head and neck pending - HgbA1c, lipid panel -Echocardiogram -PT OT eval - Resume atorvastatin  80 mg daily - Hold home antihypertensives, allow for permissive hypertension in setting of acute CVA  Essential hypertension Systolic 150s to 819d. -Holding Norvasc  10 mg, losartan /hydrochlorothiazide  100-25, the setting of acute stroke, and with contrast exposure  Hyperlipidemia associated with type 2 diabetes mellitus (HCC) Resume atorvastatin   DM2 (diabetes mellitus, type 2) (HCC) Reports unusual low blood sugar last night-60s. - SSI- M -Resume home long-acting insulin  at reduced dose 15 units nightly(30u home dose) - HgbA1c - Hold home glipizide    Prolonged QTc potassium 3.5, will replete,  - check magnesium    DVT prophylaxis: Lovenox  Code Status: FULL Family Communication: None  Disposition Plan: ~ 2 days Consults called:  neurology Admission status: Inpt Tele I certify that at the point of admission it is my clinical judgment that the patient will require inpatient hospital care spanning beyond 2 midnights from the point of admission due to high intensity of service, high risk for further deterioration and high frequency of surveillance required.  Author: Tully FORBES Carwin, MD 06/21/2024 10:00 PM  For on call review [email or web address].

## 2024-06-21 NOTE — ED Provider Notes (Signed)
 MRI shows acute stroke.  I spoke with neurology and they are recommending adding aspirin  to her Plavix  and admitting her to medicine over at [facility] with neurology consult   Suzette Pac, MD 06/21/24 2052

## 2024-06-21 NOTE — ED Provider Notes (Signed)
 [place] [facility] AT [facility] Provider Note   CSN: [id number] Arrival date & time: 06/21/24  1236     Patient presents with: Headache   Tammy Bauer is a 58 y.o. female.   Pt is a 58 yo female with pmhx significant for DM, HTN, CVA, and HLD.  Pt has had a headache and bilateral blurry vision since yesterday.  She has had double vision since her CVA, but the blurry vision is new.  She wears a dexcom glucose monitor and blood sugar was low yesterday, so she did not take her diabetic meds this am so her blood sugar is elevated today.  Pt denies any other neuro sx.        Prior to Admission medications   Medication Sig Start Date End Date Taking? Authorizing Provider  acetaminophen  (TYLENOL ) 325 MG tablet Take 650 mg by mouth every 6 (six) hours as needed for mild pain or moderate pain.     [provider]  amLODipine  (NORVASC ) 10 MG tablet TAKE ONE TABLET BY MOUTH DAILY 01/02/24   Severa Rock HERO, FNP  atorvastatin  (LIPITOR) 80 MG tablet Take 1 tablet (80 mg total) by mouth at bedtime. 12/08/22   Ijaola, Onyeje M, NP  clopidogrel  (PLAVIX ) 75 MG tablet TAKE ONE TABLET DAILY 01/02/24   Rakes, Rock HERO, FNP  Continuous Glucose Sensor (DEXCOM G7 SENSOR) MISC one sensor every 10 days for 30 days 03/16/23   [provider]  fish oil -omega-3 fatty acids  1000 MG capsule Take 2 capsules (2 g total) by mouth daily. 12/08/22   Ijaola, Onyeje M, NP  glipiZIDE  (GLUCOTROL ) 10 MG tablet TAKE 1 TABLET (10 MG TOTAL) BY MOUTH 2 (TWO) TIMES DAILY BEFORE A MEAL. 12/08/22 12/08/23  Cherylene Homer HERO, NP  insulin  glargine-yfgn (SEMGLEE , YFGN,) 100 UNIT/ML injection Inject 0.3 mLs (30 Units total) into the skin 2 (two) times daily. 03/07/22   Newlin, Enobong, MD  insulin  lispro (HUMALOG KWIKPEN) 200 UNIT/ML KwikPen Inject 2-10 Units into the skin in the morning, at noon, and at bedtime.    [provider]  INSULIN  SYRINGE 1CC/29G (B-D INSULIN  SYRINGE) 29G X 1/2 1 ML MISC 1  application by Does not apply route at bedtime. 06/01/20   Celestia Rosaline SQUIBB, NP  losartan -hydrochlorothiazide  (HYZAAR) 100-25 MG tablet Take 1 tablet by mouth daily. 11/14/23   [facility] Sebastian Pool, NP    Allergies: Ace inhibitors    Review of Systems  Eyes:  Positive for visual disturbance.  Neurological:  Positive for headaches.  All other systems reviewed and are negative.   Updated Vital Signs BP (!) 175/98   Pulse 78   Temp 98.3 F (36.8 C) (Oral)   Resp 18   Ht 5' 2 (1.575 m)   Wt 45.4 kg   SpO2 100%   BMI 18.29 kg/m   Physical Exam Vitals and nursing note reviewed.  Constitutional:      Appearance: She is well-developed.  HENT:     Head: Normocephalic and atraumatic.     Mouth/Throat:     Mouth: Mucous membranes are moist.     Pharynx: Oropharynx is clear.  Eyes:     Extraocular Movements: Extraocular movements intact.     Pupils: Pupils are equal, round, and reactive to light.  Cardiovascular:     Rate and Rhythm: Normal rate and regular rhythm.     Heart sounds: Normal heart sounds.  Pulmonary:     Effort: Pulmonary effort is normal.  Breath sounds: Normal breath sounds.  Abdominal:     General: Bowel sounds are normal.     Palpations: Abdomen is soft.  Musculoskeletal:        General: Normal range of motion.     Cervical back: Normal range of motion and neck supple.  Skin:    General: Skin is warm and dry.  Neurological:     Mental Status: She is alert and oriented to person, place, and time.  Psychiatric:        Mood and Affect: Mood normal.        Speech: Speech normal.        Behavior: Behavior normal.     (all labs ordered are listed, but only abnormal results are displayed) Labs Reviewed  CBC WITH DIFFERENTIAL/PLATELET - Abnormal; Notable for the following components:      Result Value   RBC 5.22 (*)    Hemoglobin 16.6 (*)    HCT 49.8 (*)    All other components within normal limits  BASIC METABOLIC PANEL WITH GFR -  Abnormal; Notable for the following components:   Glucose, Bld 272 (*)    All other components within normal limits  URINALYSIS, ROUTINE W REFLEX MICROSCOPIC - Abnormal; Notable for the following components:   Specific Gravity, Urine 1.032 (*)    Glucose, UA >=500 (*)    All other components within normal limits  CBG MONITORING, ED - Abnormal; Notable for the following components:   Glucose-Capillary 311 (*)    All other components within normal limits    EKG: None  Radiology: CT HEAD WO CONTRAST Result Date: 06/21/2024 CLINICAL DATA:  Gradual onset of posterior headache beginning yesterday, with pain radiating to the right shoulder. Blurry vision. EXAM: CT HEAD WITHOUT CONTRAST TECHNIQUE: Contiguous axial images were obtained from the base of the skull through the vertex without intravenous contrast. RADIATION DOSE REDUCTION: This exam was performed according to the departmental dose-optimization program which includes automated exposure control, adjustment of the mA and/or kV according to patient size and/or use of iterative reconstruction technique. COMPARISON:  Head CT, 03/20/2023. FINDINGS: Brain: There is hypoattenuation along the medial aspect of the posterior right temporal lobe extending to the occipital lobe, involving the overlying cortex, consistent with recent infarction, and new when compared to the prior head CT. Area of encephalomalacia and adjacent hypoattenuation along the superior left temporal lobe is stable consistent with an old infarct. There are additional patchy areas of white matter hypoattenuation, also unchanged, consistent with small vessel ischemic change and probable old lacunar infarcts. Ventricles are normal in size and configuration. There are no parenchymal masses or mass effect. No midline shift. There are no extra-axial masses or abnormal fluid collections. No intracranial hemorrhage. Vascular: No hyperdense vessel or unexpected calcification. Skull: No fracture  or acute finding.  No bone lesion. Sinuses/Orbits: Normal globes and orbits. Visualized sinuses are mostly clear. Other: None. IMPRESSION: 1. Recent right posterior cerebral artery distribution infarct involving the posterior, inferomedial right temporal lobe and adjacent occipital lobe. 2. No other evidence of a recent/acute intracranial abnormality. 3. No intracranial hemorrhage. Electronically Signed   By: Alm Parkins M.D.   On: 06/21/2024 15:39     Procedures   Medications Ordered in the ED  sodium chloride  0.9 % bolus 1,000 mL (1,000 mLs Intravenous New Bag/Given 06/21/24 1350)  Medical Decision Making Amount and/or Complexity of Data Reviewed Labs: ordered. Radiology: ordered.   This patient presents to the ED for concern of headache, this involves an extensive number of treatment options, and is a complaint that carries with it a high risk of complications and morbidity.  The differential diagnosis includes htn, hyperglycemia, head bleed, cva, electrolyte abn, dehydration   Co morbidities that complicate the patient evaluation  DM, HTN, CVA, and HLD   Additional history obtained:  Additional history obtained from epic chart review External records from outside source obtained and reviewed including daughter   Lab Tests:  I Ordered, and personally interpreted labs.  The pertinent results include:  cbc nl; ua; bmp with glucose elevated at 272   Imaging Studies ordered:  I ordered imaging studies including ct head and MRI I independently visualized and interpreted imaging which showed  CT head:  Recent right posterior cerebral artery distribution infarct  involving the posterior, inferomedial right temporal lobe and  adjacent occipital lobe.  2. No other evidence of a recent/acute intracranial abnormality.  3. No intracranial hemorrhage.  MRI pending at shift change I agree with the radiologist interpretation   Cardiac  Monitoring:  The patient was maintained on a cardiac monitor.  I personally viewed and interpreted the cardiac monitored which showed an underlying rhythm of: nsr   Medicines ordered and prescription drug management:  I ordered medication including ivfs  for sx  Reevaluation of the patient after these medicines showed that the patient improved I have reviewed the patients home medicines and have made adjustments as needed   Test Considered:  mri   Critical Interventions:  ct   Problem List / ED Course:  Blurry vision:  new CVA from last year.  MRI added on.  Sx since yesterday.   Reevaluation:  After the interventions noted above, I reevaluated the patient and found that they have :improved   Social Determinants of Health:  Lives at home   Dispostion:  Pending at shift change     Final diagnoses:  Blurry vision, bilateral    ED Discharge Orders     None          Dean Clarity, MD 06/21/24 1559

## 2024-06-21 NOTE — ED Notes (Signed)
 Patient wanting to leave.  States she does not want to be admitted and transferred to [facility].  She states she understands she has had a stroke, however she does not feel like she needs to be admitted.  Spoke to patient in depth about importance of admission.  Pt would like to speak to MD.  MD aware

## 2024-06-21 NOTE — ED Triage Notes (Signed)
 Pt states that yesterday around 1400 she began having a gradual onset posterior headache with pain radiating into her R shoulder. Pt also endorses worsening blurry vision. Pt with hx of CVA, T2DM insulin  dependent. Pt reports inconsistent usage of her HTN medication.

## 2024-06-21 NOTE — ED Notes (Signed)
 Patient currently eating dinner.  Will check CBG after eating.  Patient reports she normally takes insulin  after meal

## 2024-06-21 NOTE — Assessment & Plan Note (Addendum)
 Presenting with bilateral blurry vision, and posterior headache-both improving in severity.  MRI brain - 5.7 x 2.1 cm acute right PCA territory infarct within the temporal and occipital lobes.  History of prior stroke.  She reports compliance with Plavix . -EDP talked to neurology, admit to [facility], team will see in consult, add aspirin  to plavix . - CTA head and neck pending - HgbA1c, lipid panel -Echocardiogram -PT OT eval - Resume atorvastatin  80 mg daily - Hold home antihypertensives, allow for permissive hypertension in setting of acute CVA

## 2024-06-21 NOTE — Assessment & Plan Note (Signed)
 Systolic 150s to 819d. -Holding Norvasc  10 mg, losartan /hydrochlorothiazide  100-25, the setting of acute stroke, and with contrast exposure

## 2024-06-21 NOTE — Assessment & Plan Note (Signed)
-  Resume atorvastatin

## 2024-06-21 NOTE — ED Notes (Signed)
 Dr Pearlean at bedside. Kellogg RN

## 2024-06-22 ENCOUNTER — Inpatient Hospital Stay (HOSPITAL_COMMUNITY)

## 2024-06-22 ENCOUNTER — Encounter (HOSPITAL_COMMUNITY): Payer: Self-pay | Admitting: Internal Medicine

## 2024-06-22 DIAGNOSIS — Z91148 Patient's other noncompliance with medication regimen for other reason: Secondary | ICD-10-CM

## 2024-06-22 DIAGNOSIS — I639 Cerebral infarction, unspecified: Secondary | ICD-10-CM | POA: Diagnosis not present

## 2024-06-22 DIAGNOSIS — I779 Disorder of arteries and arterioles, unspecified: Secondary | ICD-10-CM | POA: Diagnosis not present

## 2024-06-22 DIAGNOSIS — E1151 Type 2 diabetes mellitus with diabetic peripheral angiopathy without gangrene: Secondary | ICD-10-CM

## 2024-06-22 DIAGNOSIS — F17210 Nicotine dependence, cigarettes, uncomplicated: Secondary | ICD-10-CM | POA: Diagnosis not present

## 2024-06-22 DIAGNOSIS — I6389 Other cerebral infarction: Secondary | ICD-10-CM

## 2024-06-22 DIAGNOSIS — I63531 Cerebral infarction due to unspecified occlusion or stenosis of right posterior cerebral artery: Secondary | ICD-10-CM

## 2024-06-22 LAB — ECHOCARDIOGRAM COMPLETE
Area-P 1/2: 3.99 cm2
Height: 62 in
Height: 62 in
S' Lateral: 3.1 cm
Weight: 1600 [oz_av]
Weight: 1600 [oz_av]

## 2024-06-22 LAB — CBG MONITORING, ED: Glucose-Capillary: 186 mg/dL — ABNORMAL HIGH (ref 70–99)

## 2024-06-22 LAB — GLUCOSE, CAPILLARY
Glucose-Capillary: 131 mg/dL — ABNORMAL HIGH (ref 70–99)
Glucose-Capillary: 215 mg/dL — ABNORMAL HIGH (ref 70–99)

## 2024-06-22 LAB — LIPID PANEL
Cholesterol: 211 mg/dL — ABNORMAL HIGH (ref 0–200)
HDL: 44 mg/dL (ref >40)
LDL Cholesterol: 134 mg/dL — ABNORMAL HIGH (ref 0–99)
Total CHOL/HDL Ratio: 4.8 ratio
Triglycerides: 163 mg/dL — ABNORMAL HIGH (ref <150)
VLDL: 33 mg/dL (ref 0–40)

## 2024-06-22 LAB — HIV ANTIBODY (ROUTINE TESTING W REFLEX): HIV Screen 4th Generation wRfx: NONREACTIVE

## 2024-06-22 LAB — HEMOGLOBIN A1C
Hgb A1c MFr Bld: 9.3 % — ABNORMAL HIGH (ref 4.8–5.6)
Mean Plasma Glucose: 220.21 mg/dL

## 2024-06-22 LAB — MAGNESIUM: Magnesium: 2 mg/dL (ref 1.7–2.4)

## 2024-06-22 MED ORDER — ENOXAPARIN SODIUM 40 MG/0.4ML IJ SOSY
40.0000 mg | PREFILLED_SYRINGE | INTRAMUSCULAR | Status: DC
  Filled 2024-06-22 (×2): qty 0.4

## 2024-06-22 MED ORDER — INSULIN GLARGINE-YFGN 100 UNIT/ML ~~LOC~~ SOLN
15.0000 [IU] | Freq: Every day | SUBCUTANEOUS | Status: DC
  Administered 2024-06-22: 15 [IU] via SUBCUTANEOUS
  Filled 2024-06-22 (×4): qty 0.15

## 2024-06-22 MED ORDER — ACETAMINOPHEN 325 MG PO TABS: 650.0000 mg | ORAL_TABLET | ORAL | Status: DC | PRN

## 2024-06-22 MED ORDER — ATORVASTATIN CALCIUM 80 MG PO TABS
80.0000 mg | ORAL_TABLET | Freq: Every day | ORAL | Status: DC
  Administered 2024-06-22: 80 mg via ORAL
  Filled 2024-06-22: qty 1

## 2024-06-22 MED ORDER — ACETAMINOPHEN 650 MG RE SUPP: 650.0000 mg | RECTAL | Status: DC | PRN

## 2024-06-22 MED ORDER — SODIUM CHLORIDE 0.9 % IV SOLN: INTRAVENOUS | Status: AC

## 2024-06-22 MED ORDER — SENNOSIDES-DOCUSATE SODIUM 8.6-50 MG PO TABS: 1.0000 | ORAL_TABLET | Freq: Every evening | ORAL | Status: DC | PRN

## 2024-06-22 MED ORDER — ACETAMINOPHEN 160 MG/5ML PO SOLN: 650.0000 mg | ORAL | Status: DC | PRN

## 2024-06-22 MED ORDER — STROKE: EARLY STAGES OF RECOVERY BOOK
Freq: Once | Status: AC
  Filled 2024-06-22 (×2): qty 1

## 2024-06-22 NOTE — Plan of Care (Signed)
 Problem: Education: Goal: Ability to describe self-care measures that may prevent or decrease complications (Diabetes Survival Skills Education) will improve 06/22/2024 1726 by Zackary Deronda HERO, RN Outcome: Progressing 06/22/2024 1725 by Zackary Deronda HERO, RN Outcome: Progressing Goal: Individualized Educational Video(s) 06/22/2024 1726 by Zackary Deronda HERO, RN Outcome: Progressing 06/22/2024 1725 by Zackary Deronda HERO, RN Outcome: Progressing   Problem: Coping: Goal: Ability to adjust to condition or change in health will improve 06/22/2024 1726 by Zackary Deronda HERO, RN Outcome: Progressing 06/22/2024 1725 by Zackary Deronda HERO, RN Outcome: Progressing   Problem: Fluid Volume: Goal: Ability to maintain a balanced intake and output will improve 06/22/2024 1726 by Zackary Deronda HERO, RN Outcome: Progressing 06/22/2024 1725 by Zackary Deronda HERO, RN Outcome: Progressing   Problem: Health Behavior/Discharge Planning: Goal: Ability to identify and utilize available resources and services will improve 06/22/2024 1726 by Zackary Deronda HERO, RN Outcome: Progressing 06/22/2024 1725 by Zackary Deronda HERO, RN Outcome: Progressing Goal: Ability to manage health-related needs will improve 06/22/2024 1726 by Zackary Deronda HERO, RN Outcome: Progressing 06/22/2024 1725 by Zackary Deronda HERO, RN Outcome: Progressing   Problem: Metabolic: Goal: Ability to maintain appropriate glucose levels will improve 06/22/2024 1726 by Zackary Deronda HERO, RN Outcome: Progressing 06/22/2024 1725 by Zackary Deronda HERO, RN Outcome: Progressing   Problem: Nutritional: Goal: Maintenance of adequate nutrition will improve 06/22/2024 1726 by Zackary Deronda HERO, RN Outcome: Progressing 06/22/2024 1725 by Zackary Deronda HERO, RN Outcome: Progressing Goal: Progress toward achieving an optimal weight will improve 06/22/2024 1726 by Zackary Deronda HERO, RN Outcome: Progressing 06/22/2024 1725 by Zackary Deronda HERO,  RN Outcome: Progressing   Problem: Skin Integrity: Goal: Risk for impaired skin integrity will decrease 06/22/2024 1726 by Zackary Deronda HERO, RN Outcome: Progressing 06/22/2024 1725 by Zackary Deronda HERO, RN Outcome: Progressing   Problem: Tissue Perfusion: Goal: Adequacy of tissue perfusion will improve 06/22/2024 1726 by Zackary Deronda HERO, RN Outcome: Progressing 06/22/2024 1725 by Zackary Deronda HERO, RN Outcome: Progressing   Problem: Education: Goal: Knowledge of disease or condition will improve 06/22/2024 1726 by Zackary Deronda HERO, RN Outcome: Progressing 06/22/2024 1725 by Zackary Deronda HERO, RN Outcome: Progressing Goal: Knowledge of secondary prevention will improve (MUST DOCUMENT ALL) 06/22/2024 1726 by Zackary Deronda HERO, RN Outcome: Progressing 06/22/2024 1725 by Zackary Deronda HERO, RN Outcome: Progressing Goal: Knowledge of patient specific risk factors will improve (DELETE if not current risk factor) 06/22/2024 1726 by Zackary Deronda HERO, RN Outcome: Progressing 06/22/2024 1725 by Zackary Deronda HERO, RN Outcome: Progressing   Problem: Ischemic Stroke/TIA Tissue Perfusion: Goal: Complications of ischemic stroke/TIA will be minimized 06/22/2024 1726 by Zackary Deronda HERO, RN Outcome: Progressing 06/22/2024 1725 by Zackary Deronda HERO, RN Outcome: Progressing   Problem: Coping: Goal: Will verbalize positive feelings about self 06/22/2024 1726 by Zackary Deronda HERO, RN Outcome: Progressing 06/22/2024 1725 by Zackary Deronda HERO, RN Outcome: Progressing Goal: Will identify appropriate support needs 06/22/2024 1726 by Zackary Deronda HERO, RN Outcome: Progressing 06/22/2024 1725 by Zackary Deronda HERO, RN Outcome: Progressing   Problem: Health Behavior/Discharge Planning: Goal: Ability to manage health-related needs will improve 06/22/2024 1726 by Zackary Deronda HERO, RN Outcome: Progressing 06/22/2024 1725 by Zackary Deronda HERO, RN Outcome: Progressing Goal: Goals will  be collaboratively established with patient/family 06/22/2024 1726 by Zackary Deronda HERO, RN Outcome: Progressing 06/22/2024 1725 by Zackary Deronda HERO, RN Outcome: Progressing   Problem: Self-Care: Goal: Ability to participate in self-care as condition permits will improve 06/22/2024 1726 by Zackary Deronda HERO, RN Outcome: Progressing 06/22/2024  1725 by Zackary Deronda HERO, RN Outcome: Progressing Goal: Verbalization of feelings and concerns over difficulty with self-care will improve 06/22/2024 1726 by Zackary Deronda HERO, RN Outcome: Progressing 06/22/2024 1725 by Zackary Deronda HERO, RN Outcome: Progressing Goal: Ability to communicate needs accurately will improve 06/22/2024 1726 by Zackary Deronda HERO, RN Outcome: Progressing 06/22/2024 1725 by Zackary Deronda HERO, RN Outcome: Progressing   Problem: Nutrition: Goal: Risk of aspiration will decrease 06/22/2024 1726 by Zackary Deronda HERO, RN Outcome: Progressing 06/22/2024 1725 by Zackary Deronda HERO, RN Outcome: Progressing Goal: Dietary intake will improve 06/22/2024 1726 by Zackary Deronda HERO, RN Outcome: Progressing 06/22/2024 1725 by Zackary Deronda HERO, RN Outcome: Progressing   Problem: Education: Goal: Knowledge of General Education information will improve Description: Including pain rating scale, medication(s)/side effects and non-pharmacologic comfort measures 06/22/2024 1726 by Zackary Deronda HERO, RN Outcome: Progressing 06/22/2024 1725 by Zackary Deronda HERO, RN Outcome: Progressing   Problem: Health Behavior/Discharge Planning: Goal: Ability to manage health-related needs will improve 06/22/2024 1726 by Zackary Deronda HERO, RN Outcome: Progressing 06/22/2024 1725 by Zackary Deronda HERO, RN Outcome: Progressing   Problem: Clinical Measurements: Goal: Ability to maintain clinical measurements within normal limits will improve 06/22/2024 1726 by Zackary Deronda HERO, RN Outcome: Progressing 06/22/2024 1725 by Zackary Deronda HERO, RN Outcome: Progressing Goal: Will remain free from infection 06/22/2024 1726 by Zackary Deronda HERO, RN Outcome: Progressing 06/22/2024 1725 by Zackary Deronda HERO, RN Outcome: Progressing Goal: Diagnostic test results will improve 06/22/2024 1726 by Zackary Deronda HERO, RN Outcome: Progressing 06/22/2024 1725 by Zackary Deronda HERO, RN Outcome: Progressing Goal: Respiratory complications will improve 06/22/2024 1726 by Zackary Deronda HERO, RN Outcome: Progressing 06/22/2024 1725 by Zackary Deronda HERO, RN Outcome: Progressing Goal: Cardiovascular complication will be avoided 06/22/2024 1726 by Zackary Deronda HERO, RN Outcome: Progressing 06/22/2024 1725 by Zackary Deronda HERO, RN Outcome: Progressing   Problem: Activity: Goal: Risk for activity intolerance will decrease 06/22/2024 1726 by Zackary Deronda HERO, RN Outcome: Progressing 06/22/2024 1725 by Zackary Deronda HERO, RN Outcome: Progressing   Problem: Nutrition: Goal: Adequate nutrition will be maintained 06/22/2024 1726 by Zackary Deronda HERO, RN Outcome: Progressing 06/22/2024 1725 by Zackary Deronda HERO, RN Outcome: Progressing   Problem: Coping: Goal: Level of anxiety will decrease 06/22/2024 1726 by Zackary Deronda HERO, RN Outcome: Progressing 06/22/2024 1725 by Zackary Deronda HERO, RN Outcome: Progressing   Problem: Elimination: Goal: Will not experience complications related to bowel motility 06/22/2024 1726 by Zackary Deronda HERO, RN Outcome: Progressing 06/22/2024 1725 by Zackary Deronda HERO, RN Outcome: Progressing Goal: Will not experience complications related to urinary retention 06/22/2024 1726 by Zackary Deronda HERO, RN Outcome: Progressing 06/22/2024 1725 by Zackary Deronda HERO, RN Outcome: Progressing   Problem: Pain Managment: Goal: General experience of comfort will improve and/or be controlled 06/22/2024 1726 by Zackary Deronda HERO, RN Outcome: Progressing 06/22/2024 1725 by Zackary Deronda HERO, RN Outcome:  Progressing   Problem: Safety: Goal: Ability to remain free from injury will improve 06/22/2024 1726 by Zackary Deronda HERO, RN Outcome: Progressing 06/22/2024 1725 by Zackary Deronda HERO, RN Outcome: Progressing   Problem: Skin Integrity: Goal: Risk for impaired skin integrity will decrease 06/22/2024 1726 by Zackary Deronda HERO, RN Outcome: Progressing 06/22/2024 1725 by Zackary Deronda HERO, RN Outcome: Progressing

## 2024-06-22 NOTE — Consult Note (Signed)
 Stroke Neurology Consultation Note  Consult Requested by: Dr. Trixie  Reason for Consult: stroke  Consult Date: 06/22/24   The history was obtained from the patient.  During history and examination, all items were able to obtain unless otherwise noted.  History of Present Illness:  Tammy Bauer is a 58 y.o. African American female with PMH of HTN, DM, HLD, strokes, smoker admitted for stroke. She stated that Friday afternoon (can not remember the time), she had visual changes while watching TV. Felt like left upper visual field a patch to block the vision. Then at night she had right back of head and neck pain, radiating to right shoulder. Denies any numbness, weakness, speech difficulty. She went to [facility] and found to have right PCA infarct. Transferred to [facility] for further work up. Currently, HA has gone and left visual field seems improved vision.   Of note, 06/2017 she was admitted for left MCA infarct involving moderate-sized left temporal lobe and left insular cortex.  MRI showed left M2 moderate stenosis.  Carotid Doppler negative.  EF 60 to 65%.  LDL 112, A1c 11.1.  TEE showed EF 60 to 65%, no PFO.  Loop recorder placed.  Discharged on aspirin  and Lipitor 40.  In 09/2017 she was again admitted for left CR infarct.  CT head and neck showed left A3 severe stenosis, moderate bilateral MCA and left PCA stenosis.  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  Loop recorder no A-fib.  LDL 61, A1c 6.6.  Discharged on DAPT and Lipitor 40.    05/2019 admitted for headache and double vision.  MRI negative.  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  Since then, she is still has slight double vision.  Patient still smokes cigarettes, half pack per day.  Stated on Plavix  daily but not taking cholesterol medication.  LSN: Friday afternoon TNK Given: No: Outside window IR: no, no LVO, also window, establish stroke MRS: 2  Past Medical History:  Diagnosis Date   Anemia    Diabetes mellitus  without complication (HCC)    Hypertension    Stroke (HCC)    x2; no deficits   Thyroid  nodule 10/02/2017    Past Surgical History:  Procedure Laterality Date   ENDOMETRIAL ABLATION N/A 01/30/2018   Procedure: ENDOMETRIAL ABLATION WITH MINERVA;  Surgeon: Jayne Vonn DEL, MD;  Location: [facility];  Service: Gynecology;  Laterality: N/A;   HYSTEROSCOPY WITH D & C N/A 01/30/2018   Procedure: DILATATION AND CURETTAGE /HYSTEROSCOPY;  Surgeon: Jayne Vonn DEL, MD;  Location: [facility];  Service: Gynecology;  Laterality: N/A;   LOOP RECORDER INSERTION N/A 06/27/2017   Procedure: Loop Recorder Insertion;  Surgeon: Inocencio Soyla Lunger, MD;  Location: [facility] INVASIVE CV LAB;  Service: Cardiovascular;  Laterality: N/A;   NERVE, TENDON AND ARTERY REPAIR Left 10/21/2019   Procedure: WOUND EXPLORATION LEFT HAND WITH REPAIR TENDON, ARTERY AND NERVE ;  Surgeon: Murrell Drivers, MD;  Location:  SURGERY CENTER;  Service: Orthopedics;  Laterality: Left;   TEE WITHOUT CARDIOVERSION N/A 06/27/2017   Procedure: TRANSESOPHAGEAL ECHOCARDIOGRAM (TEE);  Surgeon: Mona Vinie BROCKS, MD;  Location: [facility] ENDOSCOPY;  Service: Cardiovascular;  Laterality: N/A;    Family History  Problem Relation Age of Onset   Hypotension Sister    Heart disease Brother    Stroke Maternal Grandfather     Social History:  reports that she has been smoking cigarettes. She started smoking about 24 years ago. She has a 12.4 pack-year  smoking history. She has never used smokeless tobacco. She reports that she does not drink alcohol and does not use drugs.  Allergies:  Allergies  Allergen Reactions   Ace Inhibitors Cough    No current facility-administered medications on file prior to encounter.   Current Outpatient Medications on File Prior to Encounter  Medication Sig Dispense Refill   amLODipine  (NORVASC ) 10 MG tablet TAKE ONE TABLET BY MOUTH DAILY 90 tablet 1   clopidogrel  (PLAVIX ) 75 MG tablet TAKE ONE TABLET DAILY 90 tablet 0    dapagliflozin propanediol (FARXIGA) 5 MG TABS tablet Take 5 mg by mouth daily.     fish oil -omega-3 fatty acids  1000 MG capsule Take 2 capsules (2 g total) by mouth daily. (Patient taking differently: Take 2 g by mouth as needed.) 180 capsule 1   glipiZIDE  (GLUCOTROL ) 10 MG tablet TAKE 1 TABLET (10 MG TOTAL) BY MOUTH 2 (TWO) TIMES DAILY BEFORE A MEAL. 180 tablet 3   insulin  glargine-yfgn (SEMGLEE , YFGN,) 100 UNIT/ML injection Inject 0.3 mLs (30 Units total) into the skin 2 (two) times daily. 20 mL 0   losartan -hydrochlorothiazide  (HYZAAR) 100-25 MG tablet Take 1 tablet by mouth daily. 90 tablet 0   atorvastatin  (LIPITOR) 80 MG tablet Take 1 tablet (80 mg total) by mouth at bedtime. (Patient not taking: Reported on 06/22/2024) 90 tablet 1   Continuous Glucose Sensor (DEXCOM G7 SENSOR) MISC one sensor every 10 days for 30 days     INSULIN  SYRINGE 1CC/29G (B-D INSULIN  SYRINGE) 29G X 1/2 1 ML MISC 1 application by Does not apply route at bedtime. 30 each 5    Review of Systems: A full ROS was attempted today and was able to be performed.  Systems assessed include - Constitutional, Eyes, HENT, Respiratory, Cardiovascular, Gastrointestinal, Genitourinary, Integument/breast, Hematologic/lymphatic, Musculoskeletal, Neurological, Behavioral/Psych, Endocrine, Allergic/Immunologic - with pertinent responses as per HPI.  Physical Examination: Temp:  [97.9 F (36.6 C)-98.2 F (36.8 C)] 98.2 F (36.8 C) (07/20 0550) Pulse Rate:  [67-94] 82 (07/20 1215) Resp:  [15-25] 16 (07/20 0550) BP: (155-191)/(78-106) 172/91 (07/20 1215) SpO2:  [95 %-100 %] 99 % (07/20 1215)  General - well nourished, well developed, in no apparent distress.    Ophthalmologic - fundi not visualized due to noncooperation.    Cardiovascular - regular rhythm and rate  Mental Status -  Level of arousal and orientation to time, place, and person were intact. Language including expression, naming, repetition, comprehension was assessed  and found intact. Attention span and concentration were normal. Fund of Knowledge was assessed and was intact.  Cranial Nerves II - XII - II - Visual field showed left upper quadrant can see hand waving but not finger counting III, IV, VI - Extraocular movements intact. V - Facial sensation intact bilaterally VII - Facial movement intact bilaterally. VIII - Hearing & vestibular intact bilaterally. X - Palate elevates symmetrically. XI - Chin turning & shoulder shrug intact bilaterally. XII - Tongue protrusion intact.  Motor Strength - The patient's strength was normal in all extremities and pronator drift was absent.   Motor Tone & Bulk - Muscle tone was assessed at the neck and appendages and was normal.  Bulk was normal and fasciculations were absent.   Reflexes - The patient's reflexes were normal in all extremities and she had no pathological reflexes.  Sensory - Light touch, temperature/pinprick were assessed and were normal.    Coordination - The patient had normal movements in the hands and feet with no ataxia or dysmetria.  Tremor was absent.  Gait and Station - deferred  Data Reviewed: CT ANGIO HEAD NECK W WO CM Result Date: 06/21/2024 CLINICAL DATA:  Headache EXAM: CT ANGIOGRAPHY HEAD AND NECK WITH AND WITHOUT CONTRAST TECHNIQUE: Multidetector CT imaging of the head and neck was performed using the standard protocol during bolus administration of intravenous contrast. Multiplanar CT image reconstructions and MIPs were obtained to evaluate the vascular anatomy. Carotid stenosis measurements (when applicable) are obtained utilizing NASCET criteria, using the distal internal carotid diameter as the denominator. RADIATION DOSE REDUCTION: This exam was performed according to the departmental dose-optimization program which includes automated exposure control, adjustment of the mA and/or kV according to patient size and/or use of iterative reconstruction technique. CONTRAST:  75mL  OMNIPAQUE  IOHEXOL  350 MG/ML SOLN COMPARISON:  None Available. FINDINGS: CTA NECK FINDINGS Skeleton: No acute abnormality or high grade bony spinal canal stenosis. Other neck: Enlarged and heterogeneous thyroid  gland which has been previously evaluated with ultrasound. 11 mm hypodense area within the left parotid gland, which was present on MRI 05/22/2019. Upper chest: No pneumothorax or pleural effusion. No nodules or masses. Aortic arch: There is calcific atherosclerosis of the aortic arch. Conventional 3 vessel aortic branching pattern. RIGHT carotid system: No dissection, occlusion or aneurysm. There is mixed density atherosclerosis extending into the proximal ICA, resulting in less than 50% stenosis. LEFT carotid system: No dissection, occlusion or aneurysm. There is mixed density atherosclerosis extending into the proximal ICA, resulting in less than 50% stenosis. Vertebral arteries: Left dominant configuration. Mild narrowing of the origin of the left vertebral artery due to calcific atherosclerosis. Otherwise normal. CTA HEAD FINDINGS POSTERIOR CIRCULATION: Vertebral arteries are normal. No proximal occlusion of the anterior or inferior cerebellar arteries. Basilar artery is normal. Superior cerebellar arteries are normal. Posterior cerebral arteries are normal. ANTERIOR CIRCULATION: Atherosclerotic calcification of the internal carotid arteries at the skull base without hemodynamically significant stenosis. Anterior cerebral arteries are normal. 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 Middle cerebral arteries are otherwise normal. Venous sinuses: As permitted by contrast timing, patent. Anatomic variants: None Review of the MIP images confirms the above findings. IMPRESSION: 1. No emergent large vessel occlusion or high-grade stenosis of the intracranial arteries. 2. Unchanged appearance of 3 mm inferiorly directed aneurysm arising near the left MCA  bifurcation. 3. Bilateral carotid bifurcation atherosclerosis without hemodynamically significant stenosis. Aortic Atherosclerosis (ICD10-I70.0). Electronically Signed   By: Franky Stanford M.D.   On: 06/21/2024 22:35   MR BRAIN WO CONTRAST Result Date: 06/21/2024 CLINICAL DATA:  Provided history: Neuro deficit, acute, stroke suspected. Blurry vision. EXAM: MRI HEAD WITHOUT CONTRAST TECHNIQUE: Multiplanar, multiecho pulse sequences of the brain and surrounding structures were obtained without intravenous contrast. COMPARISON:  Head CT 06/21/2024. MRI brain and MRA head 05/22/2019. FINDINGS: Brain: No age-advanced or lobar predominant cerebral atrophy. Acute cortical/subcortical right PCA territory infarct within the temporal and occipital lobes, measuring 5.7 x 2.1 cm (for instance as seen on series 5, image 20). Small chronic cortical infarct within the left occipital lobe (PCA vascular territory), new from the prior MRI of 05/22/2019. Redemonstrated chronic left MCA territory cortical/subcortical infarct within the insula and superior temporal lobe. Unchanged chronic lacunar infarcts within the left cerebral hemispheric white matter. As before, there is Wallerian degeneration within the callosal body adjacent to a chronic infarct within the left frontal lobe centrum semiovale. Unchanged chronic lacunar infarcts within the thalami. Background moderate for age multifocal T2 FLAIR hyperintense signal abnormality within the  cerebral white matter, nonspecific but compatible with chronic small vessel ischemic disease. These findings have these findings are similar to the prior MRI. Few nonspecific chronic microhemorrhages scattered within the supratentorial and infratentorial brain. No evidence of an intracranial mass. No extra-axial fluid collection. No midline shift. Vascular: Maintained flow voids within the proximal large arterial vessels. Skull and upper cervical spine: No focal worrisome marrow lesion.  Incompletely assessed cervical spondylosis. Sinuses/Orbits: No mass or acute finding within the imaged orbits. 2.1 cm ovoid T2 hyperintense lesion within the left parotid gland, which has increased in size since the prior MRI. IMPRESSION: 1. 5.7 x 2.1 cm acute right PCA territory infarct within the temporal and occipital lobes. 2. Background chronic small vessel ischemic disease and chronic infarcts, as described. 3. 2.1 cm left parotid gland lesion which has increased in size since the MRI of 05/22/2019. This may reflect a cyst or a primary parotid neoplasm. ENT referral recommended. Electronically Signed   By: Rockey Childs D.O.   On: 06/21/2024 20:13   CT HEAD WO CONTRAST Result Date: 06/21/2024 CLINICAL DATA:  Gradual onset of posterior headache beginning yesterday, with pain radiating to the right shoulder. Blurry vision. EXAM: CT HEAD WITHOUT CONTRAST TECHNIQUE: Contiguous axial images were obtained from the base of the skull through the vertex without intravenous contrast. RADIATION DOSE REDUCTION: This exam was performed according to the departmental dose-optimization program which includes automated exposure control, adjustment of the mA and/or kV according to patient size and/or use of iterative reconstruction technique. COMPARISON:  Head CT, 03/20/2023. FINDINGS: Brain: There is hypoattenuation along the medial aspect of the posterior right temporal lobe extending to the occipital lobe, involving the overlying cortex, consistent with recent infarction, and new when compared to the prior head CT. Area of encephalomalacia and adjacent hypoattenuation along the superior left temporal lobe is stable consistent with an old infarct. There are additional patchy areas of white matter hypoattenuation, also unchanged, consistent with small vessel ischemic change and probable old lacunar infarcts. Ventricles are normal in size and configuration. There are no parenchymal masses or mass effect. No midline shift.  There are no extra-axial masses or abnormal fluid collections. No intracranial hemorrhage. Vascular: No hyperdense vessel or unexpected calcification. Skull: No fracture or acute finding.  No bone lesion. Sinuses/Orbits: Normal globes and orbits. Visualized sinuses are mostly clear. Other: None. IMPRESSION: 1. Recent right posterior cerebral artery distribution infarct involving the posterior, inferomedial right temporal lobe and adjacent occipital lobe. 2. No other evidence of a recent/acute intracranial abnormality. 3. No intracranial hemorrhage. Electronically Signed   By: Alm Parkins M.D.   On: 06/21/2024 15:39   US  THYROID  Result Date: 06/20/2024 CLINICAL DATA:  Thyroid  nodule follow-up EXAM: THYROID  ULTRASOUND TECHNIQUE: Ultrasound examination of the thyroid  gland and adjacent soft tissues was performed. COMPARISON:  03/06/2023 FINDINGS: Parenchymal Echotexture: Mildly heterogenous Isthmus: 0.8 cm Right lobe: 6.5 x 2.3 x 3.3 cm Left lobe: 6.3 x 1.9 x 2.6 cm _________________________________________________________ Estimated total number of nodules >/= 1 cm: 3 Number of spongiform nodules >/=  2 cm not described below (TR1): 0 Number of mixed cystic and solid nodules >/= 1.5 cm not described below (TR2): 0 _________________________________________________________ Nodule 1: 1.4 x 0.7 x 1.4 cm spongiform nodule in the isthmus (TI-RADS 1) is unchanged from prior examination. It does not meet criteria for imaging surveillance or FNA. Nodule 2: 3.4 x 2.7 x 1.5 cm spongiform nodule in the RIGHT mid thyroid  lobe (TI-RADS 1) is not significantly changed from prior examination.  It does not meet criteria for imaging surveillance or FNA. Nodule 3: 2.3 x 1.8 x 1.2 cm spongiform nodule in the mid LEFT thyroid  lobe (TI-RADS 1) is not significantly changed from prior examination. It does not meet criteria for imaging surveillance or FNA. IMPRESSION: BILATERAL benign-appearing thyroid  nodules are not significantly changed  from prior examination and do not meet criteria for FNA or imaging surveillance. The above is in keeping with the ACR TI-RADS recommendations - J Am Coll Radiol 2017;14:587-595. Electronically Signed   By: Aliene Lloyd M.D.   On: 06/20/2024 08:23    Assessment: 58 y.o. female with PMH of HTN, DM, HLD, strokes, smoker admitted for visual disturbance and right back of head and neck pain.  History of left MCA stroke x 2 in 2018.  Known left MCA bifurcation 3 mm aneurysm.  Loop recorder no A-fib in the past.  Current smoker, not compliant with medication at home.  CT showed right PCA infarct which confirmed by MRI also.  CT head and neck showed no LVO, 3 mm MCA bifurcation aneurysm.  LDL 134, A1c 9.3, 2D echo pending.  UDS pending. BP still on the high end.  Currently on aspirin  Plavix  and Lipitor.  Patient stroke most likely due to small/large vessel disease given uncontrolled risk factors.  Continue DAPT and statin, aggressive risk factor modification.  Smoking cessation education will be provided.   Plan: - 2D echo - PT consult, OT consult, Speech consult - Continue DAPT for 3 weeks and then Plavix  alone.  - Continue statin - Aggressive risk factor modification - Smoking cessation education and medication compliance education - Telemetry monitoring - Frequent neuro checks - Will follow  Thank you for this consultation and allowing us  to participate in the care of this patient.  Ary Cummins, MD PhD Stroke Neurology 06/22/2024 3:23 PM

## 2024-06-22 NOTE — Progress Notes (Signed)
 [facility] PROGRESS NOTE  Tammy Bauer (DOB: 19-Mar-1966) FMW:[id number] PCP: Tammy Bauer HERO, FNP  Brief Narrative: Tammy Bauer is a 58 y.o. female with a history of CVA, T2DM, HTN who presented to the ED on 06/21/2024 with posterior headache radiating to the right shoulder and worsening visual changes from her chronic intermittent double vision. She was mildly hypertensive with decreased visual acuity but full visual fields and no other neurological deficits. Head CT showed a recent infarct involving the posterior, inferomedial right temporal lobe and adjacent occipital lobe (right PCA territory). Subsequent MRI brain confirmed a 5.7 x 2.1 cm acute right PCA territory infarct within the temporal and occipital lobes. CTA revealed nonobstructive carotid atherosclerosis bilaterally, a 3mm aneurysm, but no LVO. Neurology was consulted, recommending DAPT and admission [facility] for formal evaluation (no consultative services available at [facility]).    Subjective: Still with visual changes described as cloudy/smudgy vision and still her chronic double vision. Headache improved currently. No new weakness or numbness. Wants to go home. Has had no chest pain, dyspnea, palpitations. Has been taking plavix , was recently prescribed statin but had not started it yet. Smokes about 1/2 ppd and is willing to entertain the idea of stopping.   Objective: BP (!) 180/98 (BP Location: Left Arm)   Pulse 76   Temp 98.2 F (36.8 C) (Oral)   Resp 16   Ht 5' 2 (1.575 m)   Wt 45.4 kg   SpO2 96%   BMI 18.29 kg/m   Gen: No distress Pulm: Clear, nonlabored  CV: RRR, no MRG.  GI: Soft, NT, ND, +BS  Neuro: Alert and oriented. Visual fields are intact with abnormal acuity. Normal speech, normal strength and sensation in all extremities.   Ext: Warm, no deformities Skin: No rashes, lesions or ulcers on visualized skin   Assessment & Plan: Acute right PCA CVA: MR also with chronic small vessel ischemic disease and has hx  CVA.  - Continue home plavix , added aspirin  81mg .  - Continue telemetry monitoring, no history of AFib and none suggested by exam thus far. Consider cardiac monitoring after discharge.  - Echocardiogram pending - PT/OT/SLP evaluations pending - Start high-intensity statin (LDL 134 / HDL 44) - Permissive HTN for now, goal normotension over next 48 hours.  - Tight glycemic control as discussed below.  - Discussed tobacco cessation as below.  HTN:  - Restart home norvasc  10mg  and losartan /HCTZ once cleared by neurology/outside permissive HTN window.   T2DM: Last HbA1c in 2024 was 14%. Random glucose here was 311 on arrival.  - Repeat HbA1c is pending, but better control is paramount. Give SSI while admitted. Urged PCP follow up for this, HTN, tobacco, HLD.   HLD: Reportedly recently started on statin but had not yet started it.  - Start high-intensity statin and recheck lipids in 6-8 weeks to guide therapy with goal LDL < 70.   Tobacco use:  - Declines nicotine  patch as it causes worsening headache. Cessation counseling provided. Amenable to discussing other medical assistance options with PCP.   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 neoplasm. Increased from imaging in 2020.  - Discussed with patient need for ENT follow up.   Intracranial aneurysm: Stable, 3mm off left MCA origin.    Thyroid  nodules: All appear benign and unchanged without need for ongoing surveillance or FNA per recent thyroid  U/S 7/14.   Tammy KATHEE Come, MD [facility] [email or web address] 06/22/2024, 10:05 AM

## 2024-06-22 NOTE — ED Notes (Signed)
 Pt ambulatory to the bathroom

## 2024-06-22 NOTE — Progress Notes (Signed)
 Attempted Echocardiogram, patient was transferred from [facility] to [facility].

## 2024-06-22 NOTE — Evaluation (Signed)
 Physical Therapy Evaluation Patient Details Name: Tammy Bauer MRN: [id number] DOB: 08-12-1966 Today's Date: 06/22/2024  History of Present Illness  Tammy Bauer is a 58 y.o. female with medical history significant for CVA, HTN, DM.  Patient presented to the ED with complaints of headache to the posterior side of her head radiating down to her right shoulder, and blurry vision associated about 10 AM yesterday.  Symptoms have persisted since onset but have improved in severity.  No facial asymmetry, no change in speech, no weakness of her extremities.  She reports compliance with her Plavix .  She has persistent double vision from her prior stroke.   Clinical Impression  Patient agreeable to and tolerated PT evaluation well this date. Patient demonstrates independence with all bed mobility, functional transfers, and ambulation of 300+ ft, which is her baseline. Patient reports improvements with headache and vision since being at hospital. All visual screens WNL, coordination testing WNL, and no obvious difference between R/L extremities concerning ROM and strength. Patient does not present with urgent need for skilled physical therapy at this time.  Patient discharged to care of nursing for ambulation daily as tolerated for length of stay.      If plan is discharge home, recommend the following: Other (comment) (N/A)   Can travel by private vehicle        Equipment Recommendations None recommended by PT  Recommendations for Other Services       Functional Status Assessment Patient has had a recent decline in their functional status and demonstrates the ability to make significant improvements in function in a reasonable and predictable amount of time.     Precautions / Restrictions Precautions Precautions: None Restrictions Weight Bearing Restrictions Per Provider Order: No      Mobility  Bed Mobility Overal bed mobility: Independent       General bed mobility comments: HOB slightly  elevated, no use of railings    Transfers Overall transfer level: Independent Equipment used: None           General transfer comment: STS and bed<>chair    Ambulation/Gait Ambulation/Gait assistance: Independent Gait Distance (Feet): 300 Feet Assistive device: None Gait Pattern/deviations: WFL(Within Functional Limits), Step-through pattern Gait velocity: WNL     General Gait Details: Pt ambulates with no excessive LOB or unsteadiness, and good velocity without AD. Reports no dizziness  Stairs            Wheelchair Mobility     Tilt Bed    Modified Rankin (Stroke Patients Only)       Balance Overall balance assessment: Mild deficits observed, not formally tested       Pertinent Vitals/Pain Pain Assessment Pain Assessment: No/denies pain    Home Living Family/patient expects to be discharged to:: Private residence Living Arrangements: Spouse/significant other Available Help at Discharge: Family;Available 24 hours/day Type of Home: House Home Access: Stairs to enter Entrance Stairs-Rails: Can reach both Entrance Stairs-Number of Steps: 3   Home Layout: One level Home Equipment: None Additional Comments: Reports no change in home set up since last admission    Prior Function Prior Level of Function : Independent/Modified Independent;Driving       Mobility Comments: Community ambulator, no AD ADLs Comments: Independent with all ADL and iADLs     Extremity/Trunk Assessment   Upper Extremity Assessment Upper Extremity Assessment: Overall WFL for tasks assessed (Smooth pursuits without saccades, peripheral vision, Finger to nose, and alternating motions all WNL. Shoulder flexion ROM WFL, and MMT 4-/5)  Lower Extremity Assessment Lower Extremity Assessment: Overall WFL for tasks assessed (Hip flexion MMT 4-/5, ankle DF MMT 4+/5, alternating foot taps WNL, heel to shin WNL)    Cervical / Trunk Assessment Cervical / Trunk Assessment: Kyphotic  (Mild)  Communication   Communication Communication: No apparent difficulties    Cognition Arousal: Alert Behavior During Therapy: WFL for tasks assessed/performed   PT - Cognitive impairments: No apparent impairments       Following commands: Intact       Cueing Cueing Techniques: Verbal cues     General Comments      Exercises     Assessment/Plan    PT Assessment Patient does not need any further PT services  PT Problem List         PT Treatment Interventions      PT Goals (Current goals can be found in the Care Plan section)  Acute Rehab PT Goals Patient Stated Goal: return home PT Goal Formulation: With patient Time For Goal Achievement: 06/24/24 Potential to Achieve Goals: Good    Frequency       Co-evaluation               AM-PAC PT 6 Clicks Mobility  Outcome Measure Help needed turning from your back to your side while in a flat bed without using bedrails?: None Help needed moving from lying on your back to sitting on the side of a flat bed without using bedrails?: None Help needed moving to and from a bed to a chair (including a wheelchair)?: None Help needed standing up from a chair using your arms (e.g., wheelchair or bedside chair)?: None Help needed to walk in hospital room?: None Help needed climbing 3-5 steps with a railing? : None 6 Click Score: 24    End of Session   Activity Tolerance: Patient tolerated treatment well Patient left: in bed;with call bell/phone within reach Nurse Communication: Mobility status PT Visit Diagnosis: Other symptoms and signs involving the nervous system (R29.898);Muscle weakness (generalized) (M62.81)    Time: 9159-9147 PT Time Calculation (min) (ACUTE ONLY): 12 min   Charges:   PT Evaluation $PT Eval Low Complexity: 1 Low PT Treatments $Therapeutic Activity: 8-22 mins PT General Charges $$ ACUTE PT VISIT: 1 Visit        10:14 AM, 06/22/24 Rosaria Settler, PT, DPT [place] with  [facility]

## 2024-06-22 NOTE — Progress Notes (Signed)
  Echocardiogram 2D Echocardiogram has been performed.  Tammy Bauer, RDCS 06/22/2024, 3:43 PM

## 2024-06-22 NOTE — Plan of Care (Signed)

## 2024-06-22 NOTE — ED Notes (Signed)
Pt given a gingerale

## 2024-06-22 NOTE — Progress Notes (Signed)
 Patient seen and examined after transfer from [facility] to  [facility].  Dr. Bryn note reviewed.  She is feeling well, vision changes have improved significantly.  Husband is at bedside.  She denies any weakness or slurred speech, able to ambulate without difficulties.  Discussed with the neurology team, Dr. Jerri will evaluate patient shortly.  I will reorder a 2D echo since it could not be done at [facility] as the patient left  Tammy Bauer M. Trixie, MD, PhD [facility]  Between 7 am - 7 pm you can contact me via Amion (for emergencies) or Securechat (non urgent matters).  I am not available 7 pm - 7 am, please contact night coverage MD/APP via Amion

## 2024-06-23 ENCOUNTER — Other Ambulatory Visit (HOSPITAL_COMMUNITY): Payer: Self-pay

## 2024-06-23 DIAGNOSIS — F17210 Nicotine dependence, cigarettes, uncomplicated: Secondary | ICD-10-CM | POA: Diagnosis not present

## 2024-06-23 DIAGNOSIS — I10 Essential (primary) hypertension: Secondary | ICD-10-CM | POA: Diagnosis not present

## 2024-06-23 DIAGNOSIS — E1152 Type 2 diabetes mellitus with diabetic peripheral angiopathy with gangrene: Secondary | ICD-10-CM

## 2024-06-23 DIAGNOSIS — I639 Cerebral infarction, unspecified: Secondary | ICD-10-CM | POA: Diagnosis not present

## 2024-06-23 DIAGNOSIS — E785 Hyperlipidemia, unspecified: Secondary | ICD-10-CM | POA: Diagnosis not present

## 2024-06-23 DIAGNOSIS — I63531 Cerebral infarction due to unspecified occlusion or stenosis of right posterior cerebral artery: Secondary | ICD-10-CM | POA: Diagnosis not present

## 2024-06-23 DIAGNOSIS — E1169 Type 2 diabetes mellitus with other specified complication: Secondary | ICD-10-CM | POA: Diagnosis not present

## 2024-06-23 DIAGNOSIS — I779 Disorder of arteries and arterioles, unspecified: Secondary | ICD-10-CM | POA: Diagnosis not present

## 2024-06-23 LAB — GLUCOSE, CAPILLARY: Glucose-Capillary: 178 mg/dL — ABNORMAL HIGH (ref 70–99)

## 2024-06-23 MED ORDER — ATORVASTATIN CALCIUM 80 MG PO TABS
80.0000 mg | ORAL_TABLET | Freq: Every day | ORAL | 1 refills | Status: DC
  Filled 2024-06-23: qty 90, 90d supply, fill #0

## 2024-06-23 MED ORDER — ASPIRIN 81 MG PO TBEC: 81.0000 mg | DELAYED_RELEASE_TABLET | Freq: Every day | ORAL | 0 refills | Status: DC

## 2024-06-23 MED ORDER — ATORVASTATIN CALCIUM 80 MG PO TABS: 80.0000 mg | ORAL_TABLET | Freq: Every day | ORAL | 1 refills | Status: DC

## 2024-06-23 MED ORDER — CLOPIDOGREL BISULFATE 75 MG PO TABS
75.0000 mg | ORAL_TABLET | Freq: Every day | ORAL | 0 refills | Status: DC
  Filled 2024-06-23: qty 90, 90d supply, fill #0

## 2024-06-23 MED ORDER — ASPIRIN 81 MG PO TBEC
81.0000 mg | DELAYED_RELEASE_TABLET | Freq: Every day | ORAL | 0 refills | Status: DC
  Filled 2024-06-23: qty 21, 21d supply, fill #0

## 2024-06-23 MED ORDER — CLOPIDOGREL BISULFATE 75 MG PO TABS: 75.0000 mg | ORAL_TABLET | Freq: Every day | ORAL | 0 refills | Status: AC

## 2024-06-23 NOTE — Discharge Summary (Addendum)
 PATIENT DETAILS Name: Tammy Bauer Age: 58 y.o. Sex: female Date of Birth: 1966/10/26 MRN: [id number]. Admitting Physician: Tully FORBES Carwin, MD PCP:St Morton Hummer, Nena, NP  Admit Date: 06/21/2024 Discharge date: 06/23/2024  Recommendations for Outpatient Follow-up:  Follow up with PCP in 1-2 weeks Please obtain CMP/CBC in one week Please ensure follow-up with outpatient neurology/ENT  Admitted From:  Home  Disposition: Home   Discharge Condition: good  CODE STATUS:   Code Status: Full Code   Diet recommendation:  Diet Order             Diet - low sodium heart healthy           Diet Carb Modified           Diet heart healthy/carb modified Fluid consistency: Thin  Diet effective now                    Brief Summary: 58 year old with history of DM-2, HTN, CVA who presented with worsening headache/visual changes-further workup revealed acute CVA.  Significant studies. 7/19>> MRI brain: Acute right PCA territory infarct 7/19>> CT angio head/neck: No LVO/hemodynamically significant stenosis. 7/20>> echo: EF 55-60%, grade 1 diastolic dysfunction 7/20>> LDL: 134 7/20>> A1c: 9.9   Brief Hospital Course: Acute right PCA infarct No further headache/visual changes-back to baseline.  No focal deficits on exam. Felt to be secondary to small vessel disease Workup as above Aspirin /Plavix  x 3 weeks then Plavix  alone Continue statin on discharge Ensure outpatient follow-up with stroke clinic.  HTN BP stable Resume Norvasc /losartan /HCTZ  DM-2 Resume prior diabetic regimen on discharge  HLD Will be continued on statin on discharge  Tobacco abuse Counseling provided.  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 Note this is an incidental finding on MRI  brain.   Discharge Diagnoses:  Principal Problem:   Acute ischemic stroke [facility]) Active Problems:   DM2 (diabetes mellitus, type 2) (HCC)   Headache   Hyperlipidemia associated with type 2 diabetes mellitus (HCC)   Essential hypertension   Discharge Instructions:  Activity:  As tolerated    Discharge Instructions     Ambulatory referral to ENT   Complete by: As directed    Parotid gland lesion.   Ambulatory referral to Neurology   Complete by: As directed    An appointment is requested in approximately: 56 weeks   Call MD for:  difficulty breathing, headache or visual disturbances   Complete by: As directed    Call MD for:  persistant dizziness or light-headedness   Complete by: As directed    Diet - low sodium heart healthy   Complete by: As directed    Diet Carb Modified   Complete by: As directed    Discharge instructions   Complete by: As directed    Follow with Primary MD  Deitra Morton Hummer Nena, NP in 1-2 weeks  Take aspirin /Plavix  x 3 weeks-after 3 weeks stop aspirin  and continue on Plavix   Please get a complete blood count and chemistry panel checked by your Primary MD at your next visit, and again as instructed by your Primary MD.  Incidental finding-you have a cyst lesion in your right parotid gland for the past several years, this lesion has grown slightly-please ask your primary care doctor for a referral to ENT.  We have also sent a electronic  referral to ENT office-the office will give you a call.  In some cases-these lesions can turn cancerous.  You have been referred to stroke clinic/neurology clinic-their office will give you a call-if you do not hear from them-please give them a call.   Get Medicines reviewed and adjusted: Please take all your medications with you for your next visit with your Primary MD  Laboratory/radiological data: Please request your Primary MD to go over all hospital tests and procedure/radiological results at the follow up,  please ask your Primary MD to get all Hospital records sent to his/her office.  In some cases, they will be blood work, cultures and biopsy results pending at the time of your discharge. Please request that your primary care M.D. follows up on these results.  Also Note the following: If you experience worsening of your admission symptoms, develop shortness of breath, life threatening emergency, suicidal or homicidal thoughts you must seek medical attention immediately by calling 911 or calling your MD immediately  if symptoms less severe.  You must read complete instructions/literature along with all the possible adverse reactions/side effects for all the Medicines you take and that have been prescribed to you. Take any new Medicines after you have completely understood and accpet all the possible adverse reactions/side effects.   Do not drive when taking Pain medications or sleeping medications (Benzodaizepines)  Do not take more than prescribed Pain, Sleep and Anxiety Medications. It is not advisable to combine anxiety,sleep and pain medications without talking with your primary care practitioner  Special Instructions: If you have smoked or chewed Tobacco  in the last 2 yrs please stop smoking, stop any regular Alcohol  and or any Recreational drug use.  Wear Seat belts while driving.  Please note: You were cared for by a hospitalist during your hospital stay. Once you are discharged, your primary care physician will handle any further medical issues. Please note that NO REFILLS for any discharge medications will be authorized once you are discharged, as it is imperative that you return to your primary care physician (or establish a relationship with a primary care physician if you do not have one) for your post hospital discharge needs so that they can reassess your need for medications and monitor your lab values.   Increase activity slowly   Complete by: As directed       Allergies as of  06/23/2024       Reactions   Ace Inhibitors Cough        Medication List     TAKE these medications    amLODipine  10 MG tablet Commonly known as: NORVASC  TAKE ONE TABLET BY MOUTH DAILY   aspirin  EC 81 MG tablet Take 1 tablet (81 mg total) by mouth daily. Swallow whole.   atorvastatin  80 MG tablet Commonly known as: LIPITOR Take 1 tablet (80 mg total) by mouth at bedtime.   clopidogrel  75 MG tablet Commonly known as: PLAVIX  Take 1 tablet (75 mg total) by mouth daily.   Dexcom G7 Sensor Misc one sensor every 10 days for 30 days   Farxiga 5 MG Tabs tablet Generic drug: dapagliflozin propanediol Take 5 mg by mouth daily.   fish oil -omega-3 fatty acids  1000 MG capsule Take 2 capsules (2 g total) by mouth daily. What changed:  when to take this reasons to take this   glipiZIDE  10 MG tablet Commonly known as: GLUCOTROL  TAKE 1 TABLET (10 MG TOTAL) BY MOUTH 2 (TWO) TIMES DAILY BEFORE A MEAL.  insulin  glargine-yfgn 100 UNIT/ML injection Commonly known as: Semglee  (yfgn) Inject 0.3 mLs (30 Units total) into the skin 2 (two) times daily.   INSULIN  SYRINGE 1CC/29G 29G X 1/2 1 ML Misc Commonly known as: B-D INSULIN  SYRINGE 1 application by Does not apply route at bedtime.   losartan -hydrochlorothiazide  100-25 MG tablet Commonly known as: Hyzaar Take 1 tablet by mouth daily.        Follow-up Information     [facility] Morton Hummer, Nena, NP. Schedule an appointment as soon as possible for a visit in 1 week(s).   Specialties: Nurse Practitioner, Family Medicine Contact information: [address] [place] [address] [phone]         [facility] Follow up.   Specialty: Neurology Why: Office will call with date/time, If you dont hear from them,please give them a call Contact information: [address] [place]  [address] [phone]        [facility] Follow up.   Specialty:  Otolaryngology Why: Office will call with date/time, If you dont hear from them,please give them a call Contact information: [address] [place]  [address] [phone] Additional information: Satellite Office - live on EPIC               Allergies  Allergen Reactions   Ace Inhibitors Cough     Other Procedures/Studies: ECHOCARDIOGRAM COMPLETE Result Date: 06/22/2024    ECHOCARDIOGRAM REPORT   Patient Name:   Tammy Bauer  Date of Exam: 06/22/2024 Medical Rec #:  [id number]  Height:       62.0 in Accession #:    [id number] Weight:       100.0 lb Date of Birth:  05-Feb-1966  BSA:          1.424 m Patient Age:    58 years   BP:           150/85 mmHg Patient Gender: F          HR:           80 bpm. Exam Location:  Inpatient Procedure: 2D Echo, Cardiac Doppler and Color Doppler (Both Spectral and Color            Flow Doppler were utilized during procedure). Indications:    Stoke  History:        Patient has prior history of Echocardiogram examinations, most                 recent 02/24/2021. Stroke; Risk Factors:Hypertension,                 Dyslipidemia and Diabetes.  Sonographer:    Koleen Popper RDCS Referring Phys: [id number] COSTIN M GHERGHE IMPRESSIONS  1. Left ventricular ejection fraction, by estimation, is 55 to 60%. The left ventricle has normal function. The left ventricle has no regional wall motion abnormalities. There is mild concentric left ventricular hypertrophy. Left ventricular diastolic parameters are consistent with Grade I diastolic dysfunction (impaired relaxation).  2. Right ventricular systolic function is normal. The right ventricular size is normal. There is normal pulmonary artery systolic pressure.  3. The mitral valve is normal in structure. No evidence of mitral valve regurgitation. No evidence of mitral stenosis.  4. Tricuspid valve regurgitation is moderate.  5. The aortic valve is normal in structure. Aortic valve regurgitation is not  visualized. Aortic valve sclerosis is present, with no evidence of aortic valve stenosis.  6. The inferior vena  cava is normal in size with <50% respiratory variability, suggesting right atrial pressure of 8 mmHg. FINDINGS  Left Ventricle: Left ventricular ejection fraction, by estimation, is 55 to 60%. The left ventricle has normal function. The left ventricle has no regional wall motion abnormalities. The left ventricular internal cavity size was normal in size. There is  mild concentric left ventricular hypertrophy. Left ventricular diastolic parameters are consistent with Grade I diastolic dysfunction (impaired relaxation). Right Ventricle: The right ventricular size is normal. No increase in right ventricular wall thickness. Right ventricular systolic function is normal. There is normal pulmonary artery systolic pressure. The tricuspid regurgitant velocity is 2.13 m/s, and  with an assumed right atrial pressure of 8 mmHg, the estimated right ventricular systolic pressure is 26.1 mmHg. Left Atrium: Left atrial size was normal in size. Right Atrium: Right atrial size was normal in size. Pericardium: There is no evidence of pericardial effusion. Mitral Valve: The mitral valve is normal in structure. No evidence of mitral valve regurgitation. No evidence of mitral valve stenosis. Tricuspid Valve: The tricuspid valve is normal in structure. Tricuspid valve regurgitation is moderate . No evidence of tricuspid stenosis. Aortic Valve: The aortic valve is normal in structure. Aortic valve regurgitation is not visualized. Aortic valve sclerosis is present, with no evidence of aortic valve stenosis. Pulmonic Valve: The pulmonic valve was normal in structure. Pulmonic valve regurgitation is not visualized. No evidence of pulmonic stenosis. Aorta: The aortic root is normal in size and structure. Venous: The inferior vena cava is normal in size with less than 50% respiratory variability, suggesting right atrial pressure of 8  mmHg. IAS/Shunts: No atrial level shunt detected by color flow Doppler.  LEFT VENTRICLE PLAX 2D LVIDd:         4.50 cm   Diastology LVIDs:         3.10 cm   LV e' medial:    4.68 cm/s LV PW:         1.20 cm   LV E/e' medial:  9.0 LV IVS:        1.10 cm   LV e' lateral:   7.07 cm/s LVOT diam:     2.00 cm   LV E/e' lateral: 5.9 LV SV:         41 LV SV Index:   29 LVOT Area:     3.14 cm  RIGHT VENTRICLE             IVC RV Basal diam:  2.70 cm     IVC diam: 1.60 cm RV S prime:     12.20 cm/s TAPSE (M-mode): 1.4 cm LEFT ATRIUM             Index        RIGHT ATRIUM          Index LA diam:        3.10 cm 2.18 cm/m   RA Area:     9.66 cm LA Vol (A2C):   21.2 ml 14.89 ml/m  RA Volume:   17.00 ml 11.94 ml/m LA Vol (A4C):   19.9 ml 13.98 ml/m LA Biplane Vol: 20.5 ml 14.40 ml/m  AORTIC VALVE LVOT Vmax:   64.50 cm/s LVOT Vmean:  45.100 cm/s LVOT VTI:    0.131 m  AORTA Ao Root diam: 3.00 cm Ao Asc diam:  3.40 cm MITRAL VALVE               TRICUSPID VALVE MV Area (PHT): 3.99 cm  TR Peak grad:   18.1 mmHg MV Decel Time: 190 msec    TR Vmax:        213.00 cm/s MV E velocity: 41.90 cm/s MV A velocity: 73.90 cm/s  SHUNTS MV E/A ratio:  0.57        Systemic VTI:  0.13 m                            Systemic Diam: 2.00 cm Kardie Tobb DO Electronically signed by Dub Huntsman DO Signature Date/Time: 06/22/2024/5:23:19 PM    Final    CT ANGIO HEAD NECK W WO CM Result Date: 06/21/2024 CLINICAL DATA:  Headache EXAM: CT ANGIOGRAPHY HEAD AND NECK WITH AND WITHOUT CONTRAST TECHNIQUE: Multidetector CT imaging of the head and neck was performed using the standard protocol during bolus administration of intravenous contrast. Multiplanar CT image reconstructions and MIPs were obtained to evaluate the vascular anatomy. Carotid stenosis measurements (when applicable) are obtained utilizing NASCET criteria, using the distal internal carotid diameter as the denominator. RADIATION DOSE REDUCTION: This exam was performed according to the  departmental dose-optimization program which includes automated exposure control, adjustment of the mA and/or kV according to patient size and/or use of iterative reconstruction technique. CONTRAST:  75mL OMNIPAQUE  IOHEXOL  350 MG/ML SOLN COMPARISON:  None Available. FINDINGS: CTA NECK FINDINGS Skeleton: No acute abnormality or high grade bony spinal canal stenosis. Other neck: Enlarged and heterogeneous thyroid  gland which has been previously evaluated with ultrasound. 11 mm hypodense area within the left parotid gland, which was present on MRI 05/22/2019. Upper chest: No pneumothorax or pleural effusion. No nodules or masses. Aortic arch: There is calcific atherosclerosis of the aortic arch. Conventional 3 vessel aortic branching pattern. RIGHT carotid system: No dissection, occlusion or aneurysm. There is mixed density atherosclerosis extending into the proximal ICA, resulting in less than 50% stenosis. LEFT carotid system: No dissection, occlusion or aneurysm. There is mixed density atherosclerosis extending into the proximal ICA, resulting in less than 50% stenosis. Vertebral arteries: Left dominant configuration. Mild narrowing of the origin of the left vertebral artery due to calcific atherosclerosis. Otherwise normal. CTA HEAD FINDINGS POSTERIOR CIRCULATION: Vertebral arteries are normal. No proximal occlusion of the anterior or inferior cerebellar arteries. Basilar artery is normal. Superior cerebellar arteries are normal. Posterior cerebral arteries are normal. ANTERIOR CIRCULATION: Atherosclerotic calcification of the internal carotid arteries at the skull base without hemodynamically significant stenosis. Anterior cerebral arteries are normal. 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 Middle cerebral arteries are otherwise normal. Venous sinuses: As permitted by contrast timing, patent. Anatomic variants: None Review of the MIP images confirms the  above findings. IMPRESSION: 1. No emergent large vessel occlusion or high-grade stenosis of the intracranial arteries. 2. 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 3. Bilateral carotid bifurcation atherosclerosis without hemodynamically significant stenosis. Aortic Atherosclerosis (ICD10-I70.0). Electronically Signed   By: Franky Stanford M.D.   On: 06/21/2024 22:35   MR BRAIN WO CONTRAST Result Date: 06/21/2024 CLINICAL DATA:  Provided history: Neuro deficit, acute, stroke suspected. Blurry vision. EXAM: MRI HEAD WITHOUT CONTRAST TECHNIQUE: Multiplanar, multiecho pulse sequences of the brain and surrounding structures were obtained without intravenous contrast. COMPARISON:  Head CT 06/21/2024. MRI brain and MRA head 05/22/2019. FINDINGS: Brain: No age-advanced or lobar predominant cerebral atrophy. Acute cortical/subcortical right PCA territory infarct within the temporal and occipital lobes, measuring 5.7 x 2.1 cm (  for instance as seen on series 5, image 20). Small chronic cortical infarct within the left occipital lobe (PCA vascular territory), new from the prior MRI of 05/22/2019. Redemonstrated chronic left MCA territory cortical/subcortical infarct within the insula and superior temporal lobe. Unchanged chronic lacunar infarcts within the left cerebral hemispheric white matter. As before, there is Wallerian degeneration within the callosal body adjacent to a chronic infarct within the left frontal lobe centrum semiovale. Unchanged chronic lacunar infarcts within the thalami. Background moderate for age multifocal T2 FLAIR hyperintense signal abnormality within the cerebral white matter, nonspecific but compatible with chronic small vessel ischemic disease. These findings have these findings are similar to the prior MRI. Few nonspecific chronic microhemorrhages scattered within the supratentorial and infratentorial brain. No evidence of an intracranial  mass. No extra-axial fluid collection. No midline shift. Vascular: Maintained flow voids within the proximal large arterial vessels. Skull and upper cervical spine: No focal worrisome marrow lesion. Incompletely assessed cervical spondylosis. Sinuses/Orbits: No mass or acute finding within the imaged orbits. 2.1 cm ovoid T2 hyperintense lesion within the left parotid gland, which has increased in size since the prior MRI. IMPRESSION: 1. 5.7 x 2.1 cm acute right PCA territory infarct within the temporal and occipital lobes. 2. Background chronic small vessel ischemic disease and chronic infarcts, as described. 3. 2.1 cm left parotid gland lesion which has increased in size since the MRI of 05/22/2019. This may reflect a cyst or a primary parotid neoplasm. ENT referral recommended. Electronically Signed   By: Rockey Childs D.O.   On: 06/21/2024 20:13   CT HEAD WO CONTRAST Result Date: 06/21/2024 CLINICAL DATA:  Gradual onset of posterior headache beginning yesterday, with pain radiating to the right shoulder. Blurry vision. EXAM: CT HEAD WITHOUT CONTRAST TECHNIQUE: Contiguous axial images were obtained from the base of the skull through the vertex without intravenous contrast. RADIATION DOSE REDUCTION: This exam was performed according to the departmental dose-optimization program which includes automated exposure control, adjustment of the mA and/or kV according to patient size and/or use of iterative reconstruction technique. COMPARISON:  Head CT, 03/20/2023. FINDINGS: Brain: There is hypoattenuation along the medial aspect of the posterior right temporal lobe extending to the occipital lobe, involving the overlying cortex, consistent with recent infarction, and new when compared to the prior head CT. Area of encephalomalacia and adjacent hypoattenuation along the superior left temporal lobe is stable consistent with an old infarct. There are additional patchy areas of white matter hypoattenuation, also unchanged,  consistent with small vessel ischemic change and probable old lacunar infarcts. Ventricles are normal in size and configuration. There are no parenchymal masses or mass effect. No midline shift. There are no extra-axial masses or abnormal fluid collections. No intracranial hemorrhage. Vascular: No hyperdense vessel or unexpected calcification. Skull: No fracture or acute finding.  No bone lesion. Sinuses/Orbits: Normal globes and orbits. Visualized sinuses are mostly clear. Other: None. IMPRESSION: 1. Recent right posterior cerebral artery distribution infarct involving the posterior, inferomedial right temporal lobe and adjacent occipital lobe. 2. No other evidence of a recent/acute intracranial abnormality. 3. No intracranial hemorrhage. Electronically Signed   By: Alm Parkins M.D.   On: 06/21/2024 15:39   US  THYROID  Result Date: 06/20/2024 CLINICAL DATA:  Thyroid  nodule follow-up EXAM: THYROID  ULTRASOUND TECHNIQUE: Ultrasound examination of the thyroid  gland and adjacent soft tissues was performed. COMPARISON:  03/06/2023 FINDINGS: Parenchymal Echotexture: Mildly heterogenous Isthmus: 0.8 cm Right lobe: 6.5 x 2.3 x 3.3 cm Left lobe: 6.3 x 1.9 x 2.6 cm  _________________________________________________________ Estimated total number of nodules >/= 1 cm: 3 Number of spongiform nodules >/=  2 cm not described below (TR1): 0 Number of mixed cystic and solid nodules >/= 1.5 cm not described below (TR2): 0 _________________________________________________________ Nodule 1: 1.4 x 0.7 x 1.4 cm spongiform nodule in the isthmus (TI-RADS 1) is unchanged from prior examination. It does not meet criteria for imaging surveillance or FNA. Nodule 2: 3.4 x 2.7 x 1.5 cm spongiform nodule in the RIGHT mid thyroid  lobe (TI-RADS 1) is not significantly changed from prior examination. It does not meet criteria for imaging surveillance or FNA. Nodule 3: 2.3 x 1.8 x 1.2 cm spongiform nodule in the mid LEFT thyroid  lobe (TI-RADS 1)  is not significantly changed from prior examination. It does not meet criteria for imaging surveillance or FNA. IMPRESSION: BILATERAL benign-appearing thyroid  nodules are not significantly changed from prior examination and do not meet criteria for FNA or imaging surveillance. The above is in keeping with the ACR TI-RADS recommendations - J Am Coll Radiol 2017;14:587-595. Electronically Signed   By: Aliene Lloyd M.D.   On: 06/20/2024 08:23     TODAY-DAY OF DISCHARGE:  Subjective:   Tammy Bauer today has no headache,no chest abdominal pain,no new weakness tingling or numbness, feels much better wants to go home today.   Objective:   Blood pressure (!) 168/96, pulse 71, temperature 98.5 F (36.9 C), temperature source Oral, resp. rate 17, height 5' 2 (1.575 m), weight 45.4 kg, SpO2 97%.  Intake/Output Summary (Last 24 hours) at 06/23/2024 0825 Last data filed at 06/22/2024 1721 Gross per 24 hour  Intake 673.61 ml  Output --  Net 673.61 ml   Filed Weights   06/21/24 1246  Weight: 45.4 kg    Exam: Awake Alert, Oriented *3, No new F.N deficits, Normal affect [place].AT,PERRAL Supple Neck,No JVD, No cervical lymphadenopathy appriciated.  Symmetrical Chest wall movement, Good air movement bilaterally, CTAB RRR,No Gallops,Rubs or new Murmurs, No Parasternal Heave +ve B.Sounds, Abd Soft, Non tender, No organomegaly appriciated, No rebound -guarding or rigidity. No Cyanosis, Clubbing or edema, No new Rash or bruise   PERTINENT RADIOLOGIC STUDIES: ECHOCARDIOGRAM COMPLETE Result Date: 06/22/2024    ECHOCARDIOGRAM REPORT   Patient Name:   Tammy Bauer  Date of Exam: 06/22/2024 Medical Rec #:  [id number]  Height:       62.0 in Accession #:    [id number] Weight:       100.0 lb Date of Birth:  07/30/1966  BSA:          1.424 m Patient Age:    58 years   BP:           150/85 mmHg Patient Gender: F          HR:           80 bpm. Exam Location:  Inpatient Procedure: 2D Echo, Cardiac Doppler and Color Doppler  (Both Spectral and Color            Flow Doppler were utilized during procedure). Indications:    Stoke  History:        Patient has prior history of Echocardiogram examinations, most                 recent 02/24/2021. Stroke; Risk Factors:Hypertension,                 Dyslipidemia and Diabetes.  Sonographer:    Koleen Popper RDCS Referring Phys: [id number] COSTIN M GHERGHE IMPRESSIONS  1. Left  ventricular ejection fraction, by estimation, is 55 to 60%. The left ventricle has normal function. The left ventricle has no regional wall motion abnormalities. There is mild concentric left ventricular hypertrophy. Left ventricular diastolic parameters are consistent with Grade I diastolic dysfunction (impaired relaxation).  2. Right ventricular systolic function is normal. The right ventricular size is normal. There is normal pulmonary artery systolic pressure.  3. The mitral valve is normal in structure. No evidence of mitral valve regurgitation. No evidence of mitral stenosis.  4. Tricuspid valve regurgitation is moderate.  5. The aortic valve is normal in structure. Aortic valve regurgitation is not visualized. Aortic valve sclerosis is present, with no evidence of aortic valve stenosis.  6. The inferior vena cava is normal in size with <50% respiratory variability, suggesting right atrial pressure of 8 mmHg. FINDINGS  Left Ventricle: Left ventricular ejection fraction, by estimation, is 55 to 60%. The left ventricle has normal function. The left ventricle has no regional wall motion abnormalities. The left ventricular internal cavity size was normal in size. There is  mild concentric left ventricular hypertrophy. Left ventricular diastolic parameters are consistent with Grade I diastolic dysfunction (impaired relaxation). Right Ventricle: The right ventricular size is normal. No increase in right ventricular wall thickness. Right ventricular systolic function is normal. There is normal pulmonary artery systolic pressure. The  tricuspid regurgitant velocity is 2.13 m/s, and  with an assumed right atrial pressure of 8 mmHg, the estimated right ventricular systolic pressure is 26.1 mmHg. Left Atrium: Left atrial size was normal in size. Right Atrium: Right atrial size was normal in size. Pericardium: There is no evidence of pericardial effusion. Mitral Valve: The mitral valve is normal in structure. No evidence of mitral valve regurgitation. No evidence of mitral valve stenosis. Tricuspid Valve: The tricuspid valve is normal in structure. Tricuspid valve regurgitation is moderate . No evidence of tricuspid stenosis. Aortic Valve: The aortic valve is normal in structure. Aortic valve regurgitation is not visualized. Aortic valve sclerosis is present, with no evidence of aortic valve stenosis. Pulmonic Valve: The pulmonic valve was normal in structure. Pulmonic valve regurgitation is not visualized. No evidence of pulmonic stenosis. Aorta: The aortic root is normal in size and structure. Venous: The inferior vena cava is normal in size with less than 50% respiratory variability, suggesting right atrial pressure of 8 mmHg. IAS/Shunts: No atrial level shunt detected by color flow Doppler.  LEFT VENTRICLE PLAX 2D LVIDd:         4.50 cm   Diastology LVIDs:         3.10 cm   LV e' medial:    4.68 cm/s LV PW:         1.20 cm   LV E/e' medial:  9.0 LV IVS:        1.10 cm   LV e' lateral:   7.07 cm/s LVOT diam:     2.00 cm   LV E/e' lateral: 5.9 LV SV:         41 LV SV Index:   29 LVOT Area:     3.14 cm  RIGHT VENTRICLE             IVC RV Basal diam:  2.70 cm     IVC diam: 1.60 cm RV S prime:     12.20 cm/s TAPSE (M-mode): 1.4 cm LEFT ATRIUM             Index        RIGHT ATRIUM  Index LA diam:        3.10 cm 2.18 cm/m   RA Area:     9.66 cm LA Vol (A2C):   21.2 ml 14.89 ml/m  RA Volume:   17.00 ml 11.94 ml/m LA Vol (A4C):   19.9 ml 13.98 ml/m LA Biplane Vol: 20.5 ml 14.40 ml/m  AORTIC VALVE LVOT Vmax:   64.50 cm/s LVOT Vmean:  45.100  cm/s LVOT VTI:    0.131 m  AORTA Ao Root diam: 3.00 cm Ao Asc diam:  3.40 cm MITRAL VALVE               TRICUSPID VALVE MV Area (PHT): 3.99 cm    TR Peak grad:   18.1 mmHg MV Decel Time: 190 msec    TR Vmax:        213.00 cm/s MV E velocity: 41.90 cm/s MV A velocity: 73.90 cm/s  SHUNTS MV E/A ratio:  0.57        Systemic VTI:  0.13 m                            Systemic Diam: 2.00 cm Kardie Tobb DO Electronically signed by Dub Huntsman DO Signature Date/Time: 06/22/2024/5:23:19 PM    Final    CT ANGIO HEAD NECK W WO CM Result Date: 06/21/2024 CLINICAL DATA:  Headache EXAM: CT ANGIOGRAPHY HEAD AND NECK WITH AND WITHOUT CONTRAST TECHNIQUE: Multidetector CT imaging of the head and neck was performed using the standard protocol during bolus administration of intravenous contrast. Multiplanar CT image reconstructions and MIPs were obtained to evaluate the vascular anatomy. Carotid stenosis measurements (when applicable) are obtained utilizing NASCET criteria, using the distal internal carotid diameter as the denominator. RADIATION DOSE REDUCTION: This exam was performed according to the departmental dose-optimization program which includes automated exposure control, adjustment of the mA and/or kV according to patient size and/or use of iterative reconstruction technique. CONTRAST:  75mL OMNIPAQUE  IOHEXOL  350 MG/ML SOLN COMPARISON:  None Available. FINDINGS: CTA NECK FINDINGS Skeleton: No acute abnormality or high grade bony spinal canal stenosis. Other neck: Enlarged and heterogeneous thyroid  gland which has been previously evaluated with ultrasound. 11 mm hypodense area within the left parotid gland, which was present on MRI 05/22/2019. Upper chest: No pneumothorax or pleural effusion. No nodules or masses. Aortic arch: There is calcific atherosclerosis of the aortic arch. Conventional 3 vessel aortic branching pattern. RIGHT carotid system: No dissection, occlusion or aneurysm. There is mixed density atherosclerosis  extending into the proximal ICA, resulting in less than 50% stenosis. LEFT carotid system: No dissection, occlusion or aneurysm. There is mixed density atherosclerosis extending into the proximal ICA, resulting in less than 50% stenosis. Vertebral arteries: Left dominant configuration. Mild narrowing of the origin of the left vertebral artery due to calcific atherosclerosis. Otherwise normal. CTA HEAD FINDINGS POSTERIOR CIRCULATION: Vertebral arteries are normal. No proximal occlusion of the anterior or inferior cerebellar arteries. Basilar artery is normal. Superior cerebellar arteries are normal. Posterior cerebral arteries are normal. ANTERIOR CIRCULATION: Atherosclerotic calcification of the internal carotid arteries at the skull base without hemodynamically significant stenosis. Anterior cerebral arteries are normal. 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 Middle cerebral arteries are otherwise normal. Venous sinuses: As permitted by contrast timing, patent. Anatomic variants: None Review of the MIP images confirms the above findings. IMPRESSION: 1. No emergent large vessel occlusion or high-grade stenosis of the intracranial arteries. 2. 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 3. Bilateral carotid bifurcation atherosclerosis without hemodynamically significant stenosis. Aortic Atherosclerosis (ICD10-I70.0). Electronically Signed   By: Franky Stanford M.D.   On: 06/21/2024 22:35   MR BRAIN WO CONTRAST Result Date: 06/21/2024 CLINICAL DATA:  Provided history: Neuro deficit, acute, stroke suspected. Blurry vision. EXAM: MRI HEAD WITHOUT CONTRAST TECHNIQUE: Multiplanar, multiecho pulse sequences of the brain and surrounding structures were obtained without intravenous contrast. COMPARISON:  Head CT 06/21/2024. MRI brain and MRA head 05/22/2019. FINDINGS: Brain: No age-advanced or lobar predominant  cerebral atrophy. Acute cortical/subcortical right PCA territory infarct within the temporal and occipital lobes, measuring 5.7 x 2.1 cm (for instance as seen on series 5, image 20). Small chronic cortical infarct within the left occipital lobe (PCA vascular territory), new from the prior MRI of 05/22/2019. Redemonstrated chronic left MCA territory cortical/subcortical infarct within the insula and superior temporal lobe. Unchanged chronic lacunar infarcts within the left cerebral hemispheric white matter. As before, there is Wallerian degeneration within the callosal body adjacent to a chronic infarct within the left frontal lobe centrum semiovale. Unchanged chronic lacunar infarcts within the thalami. Background moderate for age multifocal T2 FLAIR hyperintense signal abnormality within the cerebral white matter, nonspecific but compatible with chronic small vessel ischemic disease. These findings have these findings are similar to the prior MRI. Few nonspecific chronic microhemorrhages scattered within the supratentorial and infratentorial brain. No evidence of an intracranial mass. No extra-axial fluid collection. No midline shift. Vascular: Maintained flow voids within the proximal large arterial vessels. Skull and upper cervical spine: No focal worrisome marrow lesion. Incompletely assessed cervical spondylosis. Sinuses/Orbits: No mass or acute finding within the imaged orbits. 2.1 cm ovoid T2 hyperintense lesion within the left parotid gland, which has increased in size since the prior MRI. IMPRESSION: 1. 5.7 x 2.1 cm acute right PCA territory infarct within the temporal and occipital lobes. 2. Background chronic small vessel ischemic disease and chronic infarcts, as described. 3. 2.1 cm left parotid gland lesion which has increased in size since the MRI of 05/22/2019. This may reflect a cyst or a primary parotid neoplasm. ENT referral recommended. Electronically Signed   By: Rockey Childs D.O.   On:  06/21/2024 20:13   CT HEAD WO CONTRAST Result Date: 06/21/2024 CLINICAL DATA:  Gradual onset of posterior headache beginning yesterday, with pain radiating to the right shoulder. Blurry vision. EXAM: CT HEAD WITHOUT CONTRAST TECHNIQUE: Contiguous axial images were obtained from the base of the skull through the vertex without intravenous contrast. RADIATION DOSE REDUCTION: This exam was performed according to the departmental dose-optimization program which includes automated exposure control, adjustment of the mA and/or kV according to patient size and/or use of iterative reconstruction technique. COMPARISON:  Head CT, 03/20/2023. FINDINGS: Brain: There is hypoattenuation along the medial aspect of the posterior right temporal lobe extending to the occipital lobe, involving the overlying cortex, consistent with recent infarction, and new when compared to the prior head CT. Area of encephalomalacia and adjacent hypoattenuation along the superior left temporal lobe is stable consistent with an old infarct. There are additional patchy areas of white matter hypoattenuation, also unchanged, consistent with small vessel ischemic change and probable old lacunar infarcts. Ventricles are normal in size and configuration. There are no parenchymal masses or mass effect. No midline shift. There are no extra-axial masses or abnormal fluid collections. No intracranial hemorrhage. Vascular: No hyperdense vessel or unexpected calcification. Skull: No fracture or acute finding.  No bone lesion. Sinuses/Orbits: Normal globes and orbits. Visualized sinuses are mostly clear. Other: None. IMPRESSION: 1. Recent right posterior cerebral artery distribution infarct involving the posterior, inferomedial right temporal lobe and adjacent occipital lobe. 2. No other evidence of a recent/acute intracranial abnormality. 3. No intracranial hemorrhage. Electronically Signed   By: Alm Parkins M.D.   On: 06/21/2024 15:39     PERTINENT LAB  RESULTS: CBC: Recent Labs    06/21/24 1330  WBC 5.4  HGB 16.6*  HCT 49.8*  PLT 369   CMET CMP     Component Value Date/Time   NA 137 06/21/2024 1330   NA 142 04/03/2023 0952   K 3.5 06/21/2024 1330   CL 100 06/21/2024 1330   CO2 26 06/21/2024 1330   GLUCOSE 272 (H) 06/21/2024 1330   BUN 17 06/21/2024 1330   BUN 12 04/03/2023 0952   CREATININE 0.67 06/21/2024 1330   CALCIUM  9.8 06/21/2024 1330   PROT 7.3 03/01/2023 0853   ALBUMIN 4.5 03/01/2023 0853   AST 13 03/01/2023 0853   ALT 16 03/01/2023 0853   ALKPHOS 112 03/01/2023 0853   BILITOT 0.4 03/01/2023 0853   EGFR 97 04/03/2023 0952   GFRNONAA >60 06/21/2024 1330    GFR Estimated Creatinine Clearance: 56.3 mL/min (by C-G formula based on SCr of 0.67 mg/dL). No results for input(s): LIPASE, AMYLASE in the last 72 hours. No results for input(s): CKTOTAL, CKMB, CKMBINDEX, TROPONINI in the last 72 hours. Invalid input(s): POCBNP No results for input(s): DDIMER in the last 72 hours. Recent Labs    06/22/24 0553  HGBA1C 9.3*   Recent Labs    06/22/24 0553  CHOL 211*  HDL 44  LDLCALC 134*  TRIG 163*  CHOLHDL 4.8   No results for input(s): TSH, T4TOTAL, T3FREE, THYROIDAB in the last 72 hours.  Invalid input(s): FREET3 No results for input(s): VITAMINB12, FOLATE, FERRITIN, TIBC, IRON, RETICCTPCT in the last 72 hours. Coags: No results for input(s): INR in the last 72 hours.  Invalid input(s): PT Microbiology: No results found for this or any previous visit (from the past 240 hours).  FURTHER DISCHARGE INSTRUCTIONS:  Get Medicines reviewed and adjusted: Please take all your medications with you for your next visit with your Primary MD  Laboratory/radiological data: Please request your Primary MD to go over all hospital tests and procedure/radiological results at the follow up, please ask your Primary MD to get all Hospital records sent to his/her office.  In some  cases, they will be blood work, cultures and biopsy results pending at the time of your discharge. Please request that your primary care M.D. goes through all the records of your hospital data and follows up on these results.  Also Note the following: If you experience worsening of your admission symptoms, develop shortness of breath, life threatening emergency, suicidal or homicidal thoughts you must seek medical attention immediately by calling 911 or calling your MD immediately  if symptoms less severe.  You must read complete instructions/literature along with all the possible adverse reactions/side effects for all the Medicines you take and that have been prescribed to you. Take any new Medicines after you have completely understood and accpet all the possible adverse reactions/side effects.   Do not drive when taking Pain medications or sleeping medications (Benzodaizepines)  Do not take more than prescribed Pain, Sleep and Anxiety Medications. It is not advisable to combine anxiety,sleep and pain medications without talking with your primary care practitioner  Special Instructions: If you  have smoked or chewed Tobacco  in the last 2 yrs please stop smoking, stop any regular Alcohol  and or any Recreational drug use.  Wear Seat belts while driving.  Please note: You were cared for by a hospitalist during your hospital stay. Once you are discharged, your primary care physician will handle any further medical issues. Please note that NO REFILLS for any discharge medications will be authorized once you are discharged, as it is imperative that you return to your primary care physician (or establish a relationship with a primary care physician if you do not have one) for your post hospital discharge needs so that they can reassess your need for medications and monitor your lab values.  Total Time spent coordinating discharge including counseling, education and face to face time equals greater than  30 minutes.  Signed: Lavella Myren 06/23/2024 8:25 AM

## 2024-06-23 NOTE — Evaluation (Signed)
 Occupational Therapy Evaluation and Discharge Patient Details Name: Tammy Bauer MRN: [id number] DOB: 11-Mar-1966 Today's Date: 06/23/2024   History of Present Illness   Pt admitted to [facility] with visition changes. MRI + for R PCA infarct. Transferred to [facility]. PMH: CVA with mild diplopia, HTN, DM, HLD, smoker.     Clinical Impressions Pt is functioning at her baseline independence in ADLs and mobility. She reports her vision is improving. She has reported mild diplopia from her previous CVA and has adapted. Educated in risk factors for stroke and signs and symptoms with pt verbalizing understanding. No OT needs.      If plan is discharge home, recommend the following:         Functional Status Assessment   Patient has not had a recent decline in their functional status     Equipment Recommendations   None recommended by OT     Recommendations for Other Services         Precautions/Restrictions   Precautions Precautions: None     Mobility Bed Mobility Overal bed mobility: Independent                  Transfers Overall transfer level: Independent                        Balance                                           ADL either performed or assessed with clinical judgement   ADL Overall ADL's : Independent                                             Vision Baseline Vision/History: 1 Wears glasses Ability to See in Adequate Light: 1 Impaired Additional Comments: L upper quadrant field loss, reports it is improving, does not interfere with functioning in ADLs and mobility, pt with mild baseline diplopia, not interested in taping technique     Perception         Praxis         Pertinent Vitals/Pain Pain Assessment Pain Assessment: No/denies pain     Extremity/Trunk Assessment Upper Extremity Assessment Upper Extremity Assessment: Overall WFL for tasks assessed   Lower Extremity  Assessment Lower Extremity Assessment: Overall WFL for tasks assessed       Communication Communication Communication: No apparent difficulties   Cognition Arousal: Alert Behavior During Therapy: WFL for tasks assessed/performed Cognition: No apparent impairments                               Following commands: Intact       Cueing  General Comments          Exercises     Shoulder Instructions      Home Living Family/patient expects to be discharged to:: Private residence Living Arrangements: Spouse/significant other Available Help at Discharge: Family;Available 24 hours/day Type of Home: House Home Access: Stairs to enter [employer] of Steps: 3 Entrance Stairs-Rails: Can reach both Home Layout: One level     Bathroom Shower/Tub: Chief Strategy Officer: Standard     Home Equipment: None  Prior Functioning/Environment Prior Level of Function : Independent/Modified Independent;Driving;Working/employed                    OT Problem List:     OT Treatment/Interventions:        OT Goals(Current goals can be found in the care plan section)       OT Frequency:       Co-evaluation              AM-PAC OT 6 Clicks Daily Activity     Outcome Measure Help from another person eating meals?: None Help from another person taking care of personal grooming?: None Help from another person toileting, which includes using toliet, bedpan, or urinal?: None Help from another person bathing (including washing, rinsing, drying)?: None Help from another person to put on and taking off regular upper body clothing?: None Help from another person to put on and taking off regular lower body clothing?: None 6 Click Score: 24   End of Session    Activity Tolerance: Patient tolerated treatment well Patient left: in bed  OT Visit Diagnosis: Other (comment) (vision changes)                Time: 9079-9064 OT  Time Calculation (min): 15 min Charges:  OT General Charges $OT Visit: 1 Visit OT Evaluation $OT Eval Low Complexity: 1 Low  Mliss HERO, OTR/L Acute Rehabilitation Services Office: [phone]   Kennth Mliss Helling 06/23/2024, 9:41 AM

## 2024-06-23 NOTE — Discharge Instructions (Signed)
 Dear Moishe Mulch,   Congratulations for your interest in quitting smoking!  Find a program that suits you best: when you want to quit, how you need support, where you live, and how you like to learn.    If you're ready to get started TODAY, consider scheduling a visit through [facility] [email or web address].  Appointments are available from 8am to 8pm, Monday to Friday.   Most health insurance plans will cover some level of tobacco cessation visits and medications.    Additional Resources: [employer] are also available to help you quit & provide the support you'll need. Many programs are available in both [place] and Spanish and have a long history of successfully helping people get off and stay off tobacco.    Quit Smoking Apps:  quitSTART at [employer].de QuitGuide?at ForgetParking.dk Online education and resources: Smokefree  at [employer].gov Free Telephone Coaching: QuitNow,  Call 1-800-QUIT-NOW (3372302469) or Text- Ready to 504-755-8705 *Quitline [place] has teamed up with Medicaid to offer a free 14 week program    Vaping- Want to Quit? Free 24/7 support. Call [facility]  [place], [place], [place], [place], [place]  [facility]

## 2024-06-23 NOTE — Progress Notes (Signed)
 Tammy Bauer to be discharged home per MD order. Discussed with the patient by [place], charge RN and questions fully answered.  Skin clean, dry, and intact without evidence of skin break down. TOC meds transferred to [facility] per patient request. IV catheter discontinued intact. Site without signs and symptoms of complications. Dressing and pressure applied.  An After Visit Summary was printed and given to the patient.  Patient escorted via [facility], and discharged home via private auto.  Tammy Bauer  06/23/2024

## 2024-06-23 NOTE — Progress Notes (Signed)
 STROKE TEAM PROGRESS NOTE   SUBJECTIVE (INTERVAL HISTORY) Her RN and family is at the bedside.  Overall her condition is stable. She still complaining of a small area at L upper quadrant not able to see, but no frank hemianopia or quadrantanopia on exam.    OBJECTIVE Temp:  [97.9 F (36.6 C)-98.6 F (37 C)] 98.5 F (36.9 C) (07/21 0745) Pulse Rate:  [71-98] 71 (07/21 0745) Cardiac Rhythm: Normal sinus rhythm (07/21 0700) Resp:  [15-22] 17 (07/21 0745) BP: (137-183)/(87-103) 168/96 (07/21 0745) SpO2:  [97 %-99 %] 97 % (07/21 0745)  Recent Labs  Lab 06/21/24 2236 06/22/24 0834 06/22/24 1556 06/22/24 2230 06/23/24 0747  GLUCAP 259* 186* 131* 215* 178*   Recent Labs  Lab 06/21/24 1330 06/21/24 2359  NA 137  --   K 3.5  --   CL 100  --   CO2 26  --   GLUCOSE 272*  --   BUN 17  --   CREATININE 0.67  --   CALCIUM  9.8  --   MG  --  2.0   No results for input(s): AST, ALT, ALKPHOS, BILITOT, PROT, ALBUMIN in the last 168 hours. Recent Labs  Lab 06/21/24 1330  WBC 5.4  NEUTROABS 3.2  HGB 16.6*  HCT 49.8*  MCV 95.4  PLT 369   No results for input(s): CKTOTAL, CKMB, CKMBINDEX, TROPONINI in the last 168 hours. No results for input(s): LABPROT, INR in the last 72 hours. Recent Labs    06/21/24 1357  COLORURINE YELLOW  LABSPEC 1.032*  PHURINE 5.0  GLUCOSEU >=500*  HGBUR NEGATIVE  BILIRUBINUR NEGATIVE  KETONESUR NEGATIVE  PROTEINUR NEGATIVE  NITRITE NEGATIVE  LEUKOCYTESUR NEGATIVE       Component Value Date/Time   CHOL 211 (H) 06/22/2024 0553   CHOL 228 (H) 03/01/2023 0853   TRIG 163 (H) 06/22/2024 0553   HDL 44 06/22/2024 0553   HDL 45 03/01/2023 0853   CHOLHDL 4.8 06/22/2024 0553   VLDL 33 06/22/2024 0553   LDLCALC 134 (H) 06/22/2024 0553   LDLCALC 139 (H) 03/01/2023 0853   Lab Results  Component Value Date   HGBA1C 9.3 (H) 06/22/2024      Component Value Date/Time   LABOPIA NONE DETECTED 05/22/2019 0100   COCAINSCRNUR  NONE DETECTED 05/22/2019 0100   LABBENZ NONE DETECTED 05/22/2019 0100   AMPHETMU NONE DETECTED 05/22/2019 0100   THCU NONE DETECTED 05/22/2019 0100   LABBARB NONE DETECTED 05/22/2019 0100    No results for input(s): ETH in the last 168 hours.  I have personally reviewed the radiological images below and agree with the radiology interpretations.  ECHOCARDIOGRAM COMPLETE Result Date: 06/22/2024    ECHOCARDIOGRAM REPORT   Patient Name:   NEHEMIAH MONTEE  Date of Exam: 06/22/2024 Medical Rec #:  [id number]  Height:       62.0 in Accession #:    [id number] Weight:       100.0 lb Date of Birth:  [birth year]  BSA:          1.424 m Patient Age:    58 years   BP:           150/85 mmHg Patient Gender: F          HR:           80 bpm. Exam Location:  Inpatient Procedure: 2D Echo, Cardiac Doppler and Color Doppler (Both Spectral and Color            Flow Doppler were utilized  during procedure). Indications:    Stoke  History:        Patient has prior history of Echocardiogram examinations, most                 recent 02/24/2021. Stroke; Risk Factors:Hypertension,                 Dyslipidemia and Diabetes.  Sonographer:    Koleen Popper RDCS Referring Phys: [id number] COSTIN M GHERGHE IMPRESSIONS  1. Left ventricular ejection fraction, by estimation, is 55 to 60%. The left ventricle has normal function. The left ventricle has no regional wall motion abnormalities. There is mild concentric left ventricular hypertrophy. Left ventricular diastolic parameters are consistent with Grade I diastolic dysfunction (impaired relaxation).  2. Right ventricular systolic function is normal. The right ventricular size is normal. There is normal pulmonary artery systolic pressure.  3. The mitral valve is normal in structure. No evidence of mitral valve regurgitation. No evidence of mitral stenosis.  4. Tricuspid valve regurgitation is moderate.  5. The aortic valve is normal in structure. Aortic valve regurgitation is not visualized. Aortic valve  sclerosis is present, with no evidence of aortic valve stenosis.  6. The inferior vena cava is normal in size with <50% respiratory variability, suggesting right atrial pressure of 8 mmHg. FINDINGS  Left Ventricle: Left ventricular ejection fraction, by estimation, is 55 to 60%. The left ventricle has normal function. The left ventricle has no regional wall motion abnormalities. The left ventricular internal cavity size was normal in size. There is  mild concentric left ventricular hypertrophy. Left ventricular diastolic parameters are consistent with Grade I diastolic dysfunction (impaired relaxation). Right Ventricle: The right ventricular size is normal. No increase in right ventricular wall thickness. Right ventricular systolic function is normal. There is normal pulmonary artery systolic pressure. The tricuspid regurgitant velocity is 2.13 m/s, and  with an assumed right atrial pressure of 8 mmHg, the estimated right ventricular systolic pressure is 26.1 mmHg. Left Atrium: Left atrial size was normal in size. Right Atrium: Right atrial size was normal in size. Pericardium: There is no evidence of pericardial effusion. Mitral Valve: The mitral valve is normal in structure. No evidence of mitral valve regurgitation. No evidence of mitral valve stenosis. Tricuspid Valve: The tricuspid valve is normal in structure. Tricuspid valve regurgitation is moderate . No evidence of tricuspid stenosis. Aortic Valve: The aortic valve is normal in structure. Aortic valve regurgitation is not visualized. Aortic valve sclerosis is present, with no evidence of aortic valve stenosis. Pulmonic Valve: The pulmonic valve was normal in structure. Pulmonic valve regurgitation is not visualized. No evidence of pulmonic stenosis. Aorta: The aortic root is normal in size and structure. Venous: The inferior vena cava is normal in size with less than 50% respiratory variability, suggesting right atrial pressure of 8 mmHg. IAS/Shunts: No  atrial level shunt detected by color flow Doppler.  LEFT VENTRICLE PLAX 2D LVIDd:         4.50 cm   Diastology LVIDs:         3.10 cm   LV e' medial:    4.68 cm/s LV PW:         1.20 cm   LV E/e' medial:  9.0 LV IVS:        1.10 cm   LV e' lateral:   7.07 cm/s LVOT diam:     2.00 cm   LV E/e' lateral: 5.9 LV SV:         41 LV  SV Index:   29 LVOT Area:     3.14 cm  RIGHT VENTRICLE             IVC RV Basal diam:  2.70 cm     IVC diam: 1.60 cm RV S prime:     12.20 cm/s TAPSE (M-mode): 1.4 cm LEFT ATRIUM             Index        RIGHT ATRIUM          Index LA diam:        3.10 cm 2.18 cm/m   RA Area:     9.66 cm LA Vol (A2C):   21.2 ml 14.89 ml/m  RA Volume:   17.00 ml 11.94 ml/m LA Vol (A4C):   19.9 ml 13.98 ml/m LA Biplane Vol: 20.5 ml 14.40 ml/m  AORTIC VALVE LVOT Vmax:   64.50 cm/s LVOT Vmean:  45.100 cm/s LVOT VTI:    0.131 m  AORTA Ao Root diam: 3.00 cm Ao Asc diam:  3.40 cm MITRAL VALVE               TRICUSPID VALVE MV Area (PHT): 3.99 cm    TR Peak grad:   18.1 mmHg MV Decel Time: 190 msec    TR Vmax:        213.00 cm/s MV E velocity: 41.90 cm/s MV A velocity: 73.90 cm/s  SHUNTS MV E/A ratio:  0.57        Systemic VTI:  0.13 m                            Systemic Diam: 2.00 cm Kardie Tobb DO Electronically signed by Dub Huntsman DO Signature Date/Time: 06/22/2024/5:23:19 PM    Final    CT ANGIO HEAD NECK W WO CM Result Date: 06/21/2024 CLINICAL DATA:  Headache EXAM: CT ANGIOGRAPHY HEAD AND NECK WITH AND WITHOUT CONTRAST TECHNIQUE: Multidetector CT imaging of the head and neck was performed using the standard protocol during bolus administration of intravenous contrast. Multiplanar CT image reconstructions and MIPs were obtained to evaluate the vascular anatomy. Carotid stenosis measurements (when applicable) are obtained utilizing NASCET criteria, using the distal internal carotid diameter as the denominator. RADIATION DOSE REDUCTION: This exam was performed according to the departmental  dose-optimization program which includes automated exposure control, adjustment of the mA and/or kV according to patient size and/or use of iterative reconstruction technique. CONTRAST:  75mL OMNIPAQUE  IOHEXOL  350 MG/ML SOLN COMPARISON:  None Available. FINDINGS: CTA NECK FINDINGS Skeleton: No acute abnormality or high grade bony spinal canal stenosis. Other neck: Enlarged and heterogeneous thyroid  gland which has been previously evaluated with ultrasound. 11 mm hypodense area within the left parotid gland, which was present on MRI 05/22/2019. Upper chest: No pneumothorax or pleural effusion. No nodules or masses. Aortic arch: There is calcific atherosclerosis of the aortic arch. Conventional 3 vessel aortic branching pattern. RIGHT carotid system: No dissection, occlusion or aneurysm. There is mixed density atherosclerosis extending into the proximal ICA, resulting in less than 50% stenosis. LEFT carotid system: No dissection, occlusion or aneurysm. There is mixed density atherosclerosis extending into the proximal ICA, resulting in less than 50% stenosis. Vertebral arteries: Left dominant configuration. Mild narrowing of the origin of the left vertebral artery due to calcific atherosclerosis. Otherwise normal. CTA HEAD FINDINGS POSTERIOR CIRCULATION: Vertebral arteries are normal. No proximal occlusion of the anterior or inferior cerebellar arteries. Basilar artery is normal.  Superior cerebellar arteries are normal. Posterior cerebral arteries are normal. ANTERIOR CIRCULATION: Atherosclerotic calcification of the internal carotid arteries at the skull base without hemodynamically significant stenosis. Anterior cerebral arteries are normal. 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 Middle cerebral arteries are otherwise normal. Venous sinuses: As permitted by contrast timing, patent. Anatomic variants: None Review of the MIP images confirms the above findings.  IMPRESSION: 1. No emergent large vessel occlusion or high-grade stenosis of the intracranial arteries. 2. 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 3. Bilateral carotid bifurcation atherosclerosis without hemodynamically significant stenosis. Aortic Atherosclerosis (ICD10-I70.0). Electronically Signed   By: Franky Stanford M.D.   On: 06/21/2024 22:35   MR BRAIN WO CONTRAST Result Date: 06/21/2024 CLINICAL DATA:  Provided history: Neuro deficit, acute, stroke suspected. Blurry vision. EXAM: MRI HEAD WITHOUT CONTRAST TECHNIQUE: Multiplanar, multiecho pulse sequences of the brain and surrounding structures were obtained without intravenous contrast. COMPARISON:  Head CT 06/21/2024. MRI brain and MRA head 05/22/2019. FINDINGS: Brain: No age-advanced or lobar predominant cerebral atrophy. Acute cortical/subcortical right PCA territory infarct within the temporal and occipital lobes, measuring 5.7 x 2.1 cm (for instance as seen on series 5, image 20). Small chronic cortical infarct within the left occipital lobe (PCA vascular territory), new from the prior MRI of 05/22/2019. Redemonstrated chronic left MCA territory cortical/subcortical infarct within the insula and superior temporal lobe. Unchanged chronic lacunar infarcts within the left cerebral hemispheric white matter. As before, there is Wallerian degeneration within the callosal body adjacent to a chronic infarct within the left frontal lobe centrum semiovale. Unchanged chronic lacunar infarcts within the thalami. Background moderate for age multifocal T2 FLAIR hyperintense signal abnormality within the cerebral white matter, nonspecific but compatible with chronic small vessel ischemic disease. These findings have these findings are similar to the prior MRI. Few nonspecific chronic microhemorrhages scattered within the supratentorial and infratentorial brain. No evidence of an intracranial mass. No  extra-axial fluid collection. No midline shift. Vascular: Maintained flow voids within the proximal large arterial vessels. Skull and upper cervical spine: No focal worrisome marrow lesion. Incompletely assessed cervical spondylosis. Sinuses/Orbits: No mass or acute finding within the imaged orbits. 2.1 cm ovoid T2 hyperintense lesion within the left parotid gland, which has increased in size since the prior MRI. IMPRESSION: 1. 5.7 x 2.1 cm acute right PCA territory infarct within the temporal and occipital lobes. 2. Background chronic small vessel ischemic disease and chronic infarcts, as described. 3. 2.1 cm left parotid gland lesion which has increased in size since the MRI of 05/22/2019. This may reflect a cyst or a primary parotid neoplasm. ENT referral recommended. Electronically Signed   By: Rockey Childs D.O.   On: 06/21/2024 20:13   CT HEAD WO CONTRAST Result Date: 06/21/2024 CLINICAL DATA:  Gradual onset of posterior headache beginning yesterday, with pain radiating to the right shoulder. Blurry vision. EXAM: CT HEAD WITHOUT CONTRAST TECHNIQUE: Contiguous axial images were obtained from the base of the skull through the vertex without intravenous contrast. RADIATION DOSE REDUCTION: This exam was performed according to the departmental dose-optimization program which includes automated exposure control, adjustment of the mA and/or kV according to patient size and/or use of iterative reconstruction technique. COMPARISON:  Head CT, 03/20/2023. FINDINGS: Brain: There is hypoattenuation along the medial aspect of the posterior right temporal lobe extending to the occipital lobe, involving the overlying cortex, consistent with recent infarction, and new when compared to the  prior head CT. Area of encephalomalacia and adjacent hypoattenuation along the superior left temporal lobe is stable consistent with an old infarct. There are additional patchy areas of white matter hypoattenuation, also unchanged,  consistent with small vessel ischemic change and probable old lacunar infarcts. Ventricles are normal in size and configuration. There are no parenchymal masses or mass effect. No midline shift. There are no extra-axial masses or abnormal fluid collections. No intracranial hemorrhage. Vascular: No hyperdense vessel or unexpected calcification. Skull: No fracture or acute finding.  No bone lesion. Sinuses/Orbits: Normal globes and orbits. Visualized sinuses are mostly clear. Other: None. IMPRESSION: 1. Recent right posterior cerebral artery distribution infarct involving the posterior, inferomedial right temporal lobe and adjacent occipital lobe. 2. No other evidence of a recent/acute intracranial abnormality. 3. No intracranial hemorrhage. Electronically Signed   By: Alm Parkins M.D.   On: 06/21/2024 15:39   US  THYROID  Result Date: 06/20/2024 CLINICAL DATA:  Thyroid  nodule follow-up EXAM: THYROID  ULTRASOUND TECHNIQUE: Ultrasound examination of the thyroid  gland and adjacent soft tissues was performed. COMPARISON:  03/06/2023 FINDINGS: Parenchymal Echotexture: Mildly heterogenous Isthmus: 0.8 cm Right lobe: 6.5 x 2.3 x 3.3 cm Left lobe: 6.3 x 1.9 x 2.6 cm _________________________________________________________ Estimated total number of nodules >/= 1 cm: 3 Number of spongiform nodules >/=  2 cm not described below (TR1): 0 Number of mixed cystic and solid nodules >/= 1.5 cm not described below (TR2): 0 _________________________________________________________ Nodule 1: 1.4 x 0.7 x 1.4 cm spongiform nodule in the isthmus (TI-RADS 1) is unchanged from prior examination. It does not meet criteria for imaging surveillance or FNA. Nodule 2: 3.4 x 2.7 x 1.5 cm spongiform nodule in the RIGHT mid thyroid  lobe (TI-RADS 1) is not significantly changed from prior examination. It does not meet criteria for imaging surveillance or FNA. Nodule 3: 2.3 x 1.8 x 1.2 cm spongiform nodule in the mid LEFT thyroid  lobe (TI-RADS 1)  is not significantly changed from prior examination. It does not meet criteria for imaging surveillance or FNA. IMPRESSION: BILATERAL benign-appearing thyroid  nodules are not significantly changed from prior examination and do not meet criteria for FNA or imaging surveillance. The above is in keeping with the ACR TI-RADS recommendations - J Am Coll Radiol 2017;14:587-595. Electronically Signed   By: Aliene Lloyd M.D.   On: 06/20/2024 08:23     PHYSICAL EXAM  Temp:  [97.9 F (36.6 C)-98.6 F (37 C)] 98.5 F (36.9 C) (07/21 0745) Pulse Rate:  [71-98] 71 (07/21 0745) Resp:  [15-22] 17 (07/21 0745) BP: (137-183)/(87-103) 168/96 (07/21 0745) SpO2:  [97 %-99 %] 97 % (07/21 0745)  General - mildly malnourished, well developed, in no apparent distress.  Ophthalmologic - fundi not visualized due to noncooperation.  Cardiovascular - Regular rhythm and rate.  Mental Status -  Level of arousal and orientation to time, place, and person were intact. Language including expression, naming, repetition, comprehension was assessed and found intact. Fund of Knowledge was assessed and was intact.  Cranial Nerves II - XII - II - Visual field intact OU, no hemianopia or quadrantanopia. III, IV, VI - Extraocular movements intact. V - Facial sensation intact bilaterally. VII - Facial movement intact bilaterally. VIII - Hearing & vestibular intact bilaterally. X - Palate elevates symmetrically. XI - Chin turning & shoulder shrug intact bilaterally. XII - Tongue protrusion intact.  Motor Strength - The patient's strength was normal in all extremities and pronator drift was absent.  Bulk was normal and fasciculations were absent.  Motor Tone - Muscle tone was assessed at the neck and appendages and was normal.  Reflexes - The patient's reflexes were symmetrical in all extremities and she had no pathological reflexes.  Sensory - Light touch, temperature/pinprick were assessed and were symmetrical.     Coordination - The patient had normal movements in the hands and feet with no ataxia or dysmetria.  Tremor was absent.  Gait and Station - deferred.   ASSESSMENT/PLAN Ms. England Greb is a 58 y.o. female with history of 58 y.o. female with PMH of HTN, DM, HLD, strokes, smoker admitted for visual disturbance and right back of head and neck pain.  History of left MCA stroke x 2 in 2018.  Known left MCA bifurcation 3 mm aneurysm.  Loop recorder no A-fib in the past. She came in this time for visual changes and headache with neck pain. CT showed right PCA infarct which confirmed by MRI also.  CT head and neck showed no LVO, stable 3 mm MCA bifurcation aneurysm.    Stroke:  right PCA infarct likely secondary to large vessel disease source given uncontrolled risk factors CT showed right PCA infarct.   CT head and neck showed no LVO MRI   5.7 x 2.1 cm acute right PCA territory infarct within the temporal and occipital lobes. 2D Echo  EF 55-60% LDL 134 HgbA1c 9.3 UDS pending lovenox  for VTE prophylaxis clopidogrel  75 mg daily (not sure about compliance) prior to admission, now on aspirin  81 mg daily and clopidogrel  75 mg daily DAPT for 3 weeks and then plavix  alone Patient counseled to be compliant with her antithrombotic medications Ongoing aggressive stroke risk factor management Therapy recommendations:  none Disposition:  home  Hx of stroke 06/2017 she was admitted for left MCA infarct involving moderate-sized left temporal lobe and left insular cortex.  MRI showed left M2 moderate stenosis.  Carotid Doppler negative.  EF 60 to 65%.  LDL 112, A1c 11.1.  TEE showed EF 60 to 65%, no PFO.  Loop recorder placed.  Discharged on aspirin  and Lipitor 40. In 09/2017 she was again admitted for left CR infarct.  CT head and neck showed left A3 severe stenosis, moderate bilateral MCA and left PCA stenosis.  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  Loop recorder no A-fib.  LDL 61, A1c 6.6.  Discharged on  DAPT and Lipitor 40. 05/2019 admitted for headache and double vision.  MRI negative.  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  Since then, she is still has slight double vision.  Diabetes HgbA1c 9.3 goal < 7.0 Uncontrolled CBG monitoring SSI DM education and close PCP follow up  Hypertension Stable Long term BP goal normotensive  Hyperlipidemia Home meds:  none (not compliant with lipitor)  LDL 134, goal < 70 Now on lipitor 80 Continue statin at discharge  Tobacco abuse Current smoker Smoking cessation counseling provided Pt is willing to quit  Other Stroke Risk Factors   Other Active Problems   Hospital day # 2  Neurology will sign off. Please call with questions. Pt will follow up with stroke clinic NP at [facility] in about 4 weeks. Thanks for the consult.   Ary Cummins, MD PhD Stroke Neurology 06/23/2024 10:34 AM    To contact Stroke Continuity provider, please refer to [email or web address]. After hours, contact General Neurology

## 2024-06-24 ENCOUNTER — Telehealth: Payer: Self-pay | Admitting: *Deleted

## 2024-06-24 NOTE — Transitions of Care (Post Inpatient/ED Visit) (Signed)
 06/24/2024  Name: Tammy Bauer MRN: [id number] DOB: 1966-07-31  Today's TOC FU Call Status: Today's TOC FU Call Status:: Successful TOC FU Call Completed TOC FU Call Complete Date: 06/24/24 Patient's Name and Date of Birth confirmed.  Transition Care Management Follow-up Telephone Call Date of Discharge: 06/23/24 Discharge Facility: [facility] [place]) Type of Discharge: Inpatient Admission Primary Inpatient Discharge Diagnosis:: Acute ischemic stroke How have you been since you were released from the hospital?: Better (appetite good, no issues with ambulation) Any questions or concerns?: No  Items Reviewed: Did you receive and understand the discharge instructions provided?: Yes Medications obtained,verified, and reconciled?: Yes (Medications Reviewed) Any new allergies since your discharge?: No Dietary orders reviewed?: Yes Type of Diet Ordered:: low sodium   heart healthy Do you have support at home?: Yes People in Home [RPT]: spouse Name of Support/Comfort Primary Source: Tammy [facility]  spouse Reviewed signs/ symptoms of stroke, importance of seeking emergency assistance as soon as possible for symptoms of stroke  Medications Reviewed Today: Medications Reviewed Today     Reviewed by Tammy Tammy LABOR, RN (Registered Nurse) on 06/24/24 at 1358  Med List Status: <None>   Medication Order Taking? Sig Documenting Provider Last Dose Status Informant  amLODipine  (NORVASC ) 10 MG tablet [id number] Yes TAKE ONE TABLET BY MOUTH DAILY Rakes, Rock HERO, FNP  Active Self, Pharmacy Records  aspirin  EC 81 MG tablet [id number] Yes Take 1 tablet (81 mg total) by mouth daily. Swallow whole. Tammy Donalda Tammy [facility]  Active   atorvastatin  (LIPITOR) 80 MG tablet [id number] Yes Take 1 tablet (80 mg total) by mouth at bedtime. Tammy Donalda Tammy [facility]  Active   clopidogrel  (PLAVIX ) 75 MG tablet [id number] Yes Take 1 tablet (75 mg total) by mouth daily. Tammy Donalda Tammy [facility]  Active   Continuous Glucose Sensor  (DEXCOM G7 SENSOR) MISC [id number]  one sensor every 10 days for 30 days  Patient not taking: Reported on 06/24/2024   Provider, Historical, [facility]  Active Self, Pharmacy Records  dapagliflozin propanediol (FARXIGA) 5 MG TABS tablet [id number]  Take 5 mg by mouth daily.  Patient not taking: Reported on 06/24/2024   Provider, Historical, [facility]  Active Self, Pharmacy Records           Med Note Tammy [facility]) Patient states that she takes this medication; unable to find any dispense history to support this claim.  fish oil -omega-3 fatty acids  1000 MG capsule [id number] Yes Take 2 capsules (2 g total) by mouth daily.  Patient taking differently: Take 2 g by mouth as needed.   Tammy Homer HERO, [facility]  Active Self, Pharmacy Records           Med Note LEOBARDO, SCHUYLER GORMAN Repress Jun 22, 2024 11:39 [facility]) Patient states that she takes this whenever she feels like it. Does not take it daily.  glipiZIDE  (GLUCOTROL ) 10 MG tablet [id number] Yes TAKE 1 TABLET (10 MG TOTAL) BY MOUTH 2 (TWO) TIMES DAILY BEFORE A MEAL. Tammy Homer HERO, [facility]  Active Self, Pharmacy Records  insulin  glargine-yfgn (SEMGLEE , YFGN,) 100 UNIT/ML injection [id number] Yes Inject 0.3 mLs (30 Units total) into the skin 2 (two) times daily. Tammy [facility]  Active Self, Pharmacy Records  INSULIN  SYRINGE 1CC/29G (B-D INSULIN  SYRINGE) 29G X 1/2 1 ML MISC [id number] Yes 1 application by Does not apply route at bedtime. Tammy Rosaline SQUIBB, [facility]  Active Self, Pharmacy Records  losartan -hydrochlorothiazide  [facility]) 100-25 MG tablet [id number]  Yes Take 1 tablet by mouth daily. Tammy [facility]  Active Self, Pharmacy Records            Home Care and Equipment/Supplies: Were Home [facility] Services Ordered?: No Any new equipment or medical supplies ordered?: No  Functional Questionnaire: Do you need assistance with bathing/showering or dressing?: No Do you need assistance with meal preparation?: No Do you need assistance with  eating?: No Do you have difficulty maintaining continence: No Do you need assistance with getting out of bed/getting out of a chair/moving?: No Do you have difficulty managing or taking your medications?: No  Follow up appointments reviewed: PCP Follow-up appointment confirmed?: Yes Date of PCP follow-up appointment?: 07/03/24 Follow-up Provider: Nena Hummer [facility]  @ [facility] Follow-up appointment confirmed?: No Follow-Up Specialty Provider:: pt states she has contact # for Neurologist/ stroke clinic and ENT, if they do not call pt, she will call and is able to do so Reason Specialist Follow-Up Not Confirmed: Patient has Specialist Provider Number and will Call for Appointment Do you need transportation to your follow-up appointment?: No Do you understand care options if your condition(s) worsen?: Yes-patient verbalized understanding  SDOH Interventions Today    Flowsheet Row Most Recent Value  SDOH Interventions   Food Insecurity Interventions Intervention Not Indicated  Housing Interventions Intervention Not Indicated  Transportation Interventions Intervention Not Indicated  Utilities Interventions Intervention Not Indicated    Tammy [facility] [phone]

## 2024-06-25 ENCOUNTER — Ambulatory Visit: Payer: Self-pay

## 2024-06-25 NOTE — Telephone Encounter (Signed)
 E2c2 scheduled appt

## 2024-06-25 NOTE — Telephone Encounter (Signed)
 FYI Only or Action Required?: FYI only for provider.  Patient was last seen in primary care on 11/14/2023 by Deitra Morton Sebastian Nena, NP.  Called Nurse Triage reporting right ear pain.  Symptoms began today.  Interventions attempted: Nothing.  Symptoms are: unchanged.  Triage Disposition: See Physician Within 24 Hours  Patient/caregiver understands and will follow disposition?: Yes    Apt tomorrow                Copied from CRM [id number]. Topic: Clinical - Red Word Triage >> Jun 25, 2024 11:45 AM Antwanette L wrote: Red Word that prompted transfer to Nurse Triage: Patient is experiencing right ear pain whenever she swallows. The patient just recently had stroke on 06/20/24 Reason for Disposition  Earache  (Exceptions: Brief ear pain of lasting less than 60 minutes, or earache occurring during air travel.)  Answer Assessment - Initial Assessment Questions 1. LOCATION: Which ear is involved?     Right ear pain 2. ONSET: When did the ear pain start?      This am after swallowing 3. SEVERITY: How bad is the pain?  (Scale 1-10; mild, moderate or severe)     moderate 4. URI SYMPTOMS: Do you have a runny nose or cough?     Runny nose started this am, hx CVA on right side on Friday. 5. FEVER: Do you have a fever? If Yes, ask: What is your temperature, how was it measured, and when did it start?     no 6. CAUSE: Have you been swimming recently?, How often do you use Q-TIPS?, Have you had any recent air travel or scuba diving?     denies 7. OTHER SYMPTOMS: Do you have any other symptoms? (e.g., decreased hearing, dizziness, headache, stiff neck, vomiting)     no 8. PREGNANCY: Is there any chance you are pregnant? When was your last menstrual period?     na  Protocols used: [place]

## 2024-06-25 NOTE — Progress Notes (Unsigned)
   Acute Office Visit  Subjective:     Patient ID: Tammy [facility], female    DOB: 1966-09-29, 58 y.o.   MRN: [id number]  No chief complaint on file.   HPI [facility] Ear Pain: Patient presents with {left/right/bi:30031} ear pain.  Symptoms include {otitis symptoms:327}. Symptoms began {numbers; 0-10:33138} {unit:11} ago and are {course:17::unchanged} since that time. Patient denies {respiratory symptoms:16811}. Ear history: {numbers; 0-10:33138} previous ear infections.   Active Ambulatory Problems    Diagnosis Date Noted   Hypertension associated with diabetes (HCC) 10/07/2015   Hyperglycemia 10/07/2015   Hypertensive emergency 06/25/2017   Tobacco abuse 06/25/2017   Acute ischemic stroke (HCC) 10/02/2017   DM2 (diabetes mellitus, type 2) (HCC) 10/02/2017   Vaginal bleeding 01/03/2018   Acute blood loss anemia 01/03/2018   Anemia 01/03/2018   TIA (transient ischemic attack) 05/21/2019   Diplopia 05/22/2019   Headache 05/22/2019   Hyperlipidemia associated with type 2 diabetes mellitus (HCC) 05/22/2019   Pneumonia due to COVID-19 virus 06/20/2020   Dehydration 06/20/2020   Hyponatremia 06/20/2020   Acute respiratory failure with hypoxia (HCC) 06/20/2020   Essential hypertension 11/15/2023   Resolved Ambulatory Problems    Diagnosis Date Noted   CVA (cerebral vascular accident) (HCC) 06/25/2017   Past Medical History:  Diagnosis Date   Diabetes mellitus without complication (HCC)    Hypertension    Stroke (HCC)    Thyroid  nodule 10/02/2017    ROS Negative unless indicated in HPI    Objective:    There were no vitals taken for this visit. BP Readings from Last 3 Encounters:  06/23/24 (!) 168/96  11/14/23 (!) 168/91  04/03/23 (!) 160/95   Wt Readings from Last 3 Encounters:  06/21/24 100 lb (45.4 kg)  11/14/23 115 lb (52.2 kg)  04/03/23 121 lb (54.9 kg)      Physical Exam Pertinent labs & imaging results that were available during my care of the patient were  reviewed by me and considered in my medical decision making.  No results found for any visits on 06/26/24.      Assessment & Plan:  There are no diagnoses linked to this encounter. Continue healthy lifestyle choices, including diet (rich in fruits, vegetables, and lean proteins, and low in salt and simple carbohydrates) and exercise (at least 30 minutes of moderate physical activity daily).     The above assessment and management plan was discussed with the patient. The patient verbalized understanding of and has agreed to the management plan. Patient is aware to call the clinic if they develop any new symptoms or if symptoms persist or worsen. Patient is aware when to return to the clinic for a follow-up visit. Patient educated on when it is appropriate to go to the emergency department.  No follow-ups on file.  Vibha Ferdig [place] Thompson, DNP [facility] [address] [place], [place] [address] [phone]  Note: This document was prepared by [employer] voice dictation technology and any errors that results from this process are unintentional.

## 2024-06-26 ENCOUNTER — Other Ambulatory Visit (HOSPITAL_BASED_OUTPATIENT_CLINIC_OR_DEPARTMENT_OTHER): Payer: Self-pay

## 2024-06-26 ENCOUNTER — Encounter: Payer: Self-pay | Admitting: Nurse Practitioner

## 2024-06-26 ENCOUNTER — Ambulatory Visit: Admitting: Nurse Practitioner

## 2024-06-26 VITALS — BP 133/84 | HR 90 | Temp 97.7°F | Ht 62.0 in | Wt 105.8 lb

## 2024-06-26 DIAGNOSIS — Z09 Encounter for follow-up examination after completed treatment for conditions other than malignant neoplasm: Secondary | ICD-10-CM

## 2024-06-26 DIAGNOSIS — E1152 Type 2 diabetes mellitus with diabetic peripheral angiopathy with gangrene: Secondary | ICD-10-CM

## 2024-06-26 DIAGNOSIS — Z794 Long term (current) use of insulin: Secondary | ICD-10-CM

## 2024-06-26 DIAGNOSIS — H60501 Unspecified acute noninfective otitis externa, right ear: Secondary | ICD-10-CM

## 2024-06-26 DIAGNOSIS — I6930 Unspecified sequelae of cerebral infarction: Secondary | ICD-10-CM | POA: Diagnosis not present

## 2024-06-26 DIAGNOSIS — I639 Cerebral infarction, unspecified: Secondary | ICD-10-CM

## 2024-06-26 MED ORDER — CIPROFLOXACIN-DEXAMETHASONE 0.3-0.1 % OT SUSP: 4.0000 [drp] | Freq: Two times a day (BID) | OTIC | 0 refills | Status: AC

## 2024-07-03 ENCOUNTER — Inpatient Hospital Stay: Admitting: Nurse Practitioner

## 2024-07-04 ENCOUNTER — Encounter (HOSPITAL_COMMUNITY): Payer: Self-pay | Admitting: Emergency Medicine

## 2024-07-04 ENCOUNTER — Emergency Department (HOSPITAL_COMMUNITY)

## 2024-07-04 ENCOUNTER — Emergency Department (HOSPITAL_COMMUNITY)
Admission: EM | Admit: 2024-07-04 | Discharge: 2024-07-04 | Disposition: A | Discharge status: Home / Self Care | Attending: Emergency Medicine | Admitting: Emergency Medicine

## 2024-07-04 DIAGNOSIS — R221 Localized swelling, mass and lump, neck: Secondary | ICD-10-CM | POA: Insufficient documentation

## 2024-07-04 DIAGNOSIS — Z8673 Personal history of transient ischemic attack (TIA), and cerebral infarction without residual deficits: Secondary | ICD-10-CM | POA: Insufficient documentation

## 2024-07-04 DIAGNOSIS — Z794 Long term (current) use of insulin: Secondary | ICD-10-CM | POA: Insufficient documentation

## 2024-07-04 DIAGNOSIS — L989 Disorder of the skin and subcutaneous tissue, unspecified: Secondary | ICD-10-CM | POA: Diagnosis present

## 2024-07-04 DIAGNOSIS — Z7982 Long term (current) use of aspirin: Secondary | ICD-10-CM | POA: Insufficient documentation

## 2024-07-04 DIAGNOSIS — R599 Enlarged lymph nodes, unspecified: Secondary | ICD-10-CM

## 2024-07-04 LAB — COMPREHENSIVE METABOLIC PANEL WITH GFR
ALT: 10 U/L (ref 0–44)
AST: 12 U/L — ABNORMAL LOW (ref 15–41)
Albumin: 3.6 g/dL (ref 3.5–5.0)
Alkaline Phosphatase: 54 U/L (ref 38–126)
Anion gap: 8 (ref 5–15)
BUN: 15 mg/dL (ref 6–20)
CO2: 25 mmol/L (ref 22–32)
Calcium: 10.1 mg/dL (ref 8.9–10.3)
Chloride: 105 mmol/L (ref 98–111)
Creatinine, Ser: 0.79 mg/dL (ref 0.44–1.00)
GFR, Estimated: >60 mL/min (ref >60)
Glucose, Bld: 250 mg/dL — ABNORMAL HIGH (ref 70–99)
Potassium: 3.9 mmol/L (ref 3.5–5.1)
Sodium: 138 mmol/L (ref 135–145)
Total Bilirubin: 0.3 mg/dL (ref 0.0–1.2)
Total Protein: 6.9 g/dL (ref 6.5–8.1)

## 2024-07-04 LAB — CBC WITH DIFFERENTIAL/PLATELET
Abs Immature Granulocytes: 0.01 K/uL (ref 0.00–0.07)
Basophils Absolute: 0 K/uL (ref 0.0–0.1)
Basophils Relative: 1 %
Eosinophils Absolute: 0.1 K/uL (ref 0.0–0.5)
Eosinophils Relative: 2 %
HCT: 46.6 % — ABNORMAL HIGH (ref 36.0–46.0)
Hemoglobin: 15.4 g/dL — ABNORMAL HIGH (ref 12.0–15.0)
Immature Granulocytes: 0 %
Lymphocytes Relative: 37 %
Lymphs Abs: 2.1 K/uL (ref 0.7–4.0)
MCH: 31.6 pg (ref 26.0–34.0)
MCHC: 33 g/dL (ref 30.0–36.0)
MCV: 95.7 fL (ref 80.0–100.0)
Monocytes Absolute: 0.6 K/uL (ref 0.1–1.0)
Monocytes Relative: 10 %
Neutro Abs: 3 K/uL (ref 1.7–7.7)
Neutrophils Relative %: 50 %
Platelets: 386 K/uL (ref 150–400)
RBC: 4.87 MIL/uL (ref 3.87–5.11)
RDW: 13.2 % (ref 11.5–15.5)
WBC: 5.9 K/uL (ref 4.0–10.5)
nRBC: 0 % (ref 0.0–0.2)

## 2024-07-04 MED ORDER — IOHEXOL 350 MG/ML SOLN
75.0000 mL | Freq: Once | INTRAVENOUS | Status: AC | PRN
  Administered 2024-07-04: 75 mL via INTRAVENOUS

## 2024-07-04 NOTE — ED Provider Notes (Signed)
 [place] EM[facility]GENC[facility] DEPART[facility] AT [facility] Provider Note   CSN: [id number] Arrival date & time: 07/04/24  9445     Patient presents with: Skin Problem   Tammy Bauer is a 58 y.o. female.   HPI   58 year old female with recent right PCA CVA 2 weeks ago with residual vision deficits presents emergency department with concern for a hard palpable mass along the left side of her neck.  Patient states that she noticed it this morning when she woke up.  It is tender to palpation.  Denies any recent ear/throat infection.  Currently on DAPT and compliant.  Patient denies any acute headache, new neurologic deficits.  Prior to Admission medications   Medication Sig Start Date End Date Taking? Authorizing Provider  amLODipine  (NORVASC ) 10 MG tablet TAKE ONE TABLET BY MOUTH DAILY 01/02/24   Severa Rock HERO, FNP  aspirin  EC 81 MG tablet Take 1 tablet (81 mg total) by mouth daily. Swallow whole. 06/23/24   Ghimire, Donalda HERO, MD  atorvastatin  (LIPITOR) 80 MG tablet Take 1 tablet (80 mg total) by mouth at bedtime. 06/23/24   Ghimire, Donalda HERO, MD  clopidogrel  (PLAVIX ) 75 MG tablet Take 1 tablet (75 mg total) by mouth daily. 06/23/24   Ghimire, Donalda HERO, MD  Continuous Glucose Sensor (DEXCOM G7 SENSOR) MISC one sensor every 10 days for 30 days Patient not taking: Reported on 06/26/2024 03/16/23   [provider]  dapagliflozin propanediol (FARXIGA) 5 MG TABS tablet Take 5 mg by mouth daily. Patient not taking: Reported on 06/26/2024 02/29/24   [provider]  fish oil -omega-3 fatty acids  1000 MG capsule Take 2 capsules (2 g total) by mouth daily. 12/08/22   Ijaola, Onyeje M, NP  glipiZIDE  (GLUCOTROL ) 10 MG tablet TAKE 1 TABLET (10 MG TOTAL) BY MOUTH 2 (TWO) TIMES DAILY BEFORE A MEAL. 12/08/22 06/26/24  Cherylene Homer HERO, NP  insulin  glargine-yfgn (SEMGLEE , YFGN,) 100 UNIT/ML injection Inject 0.3 mLs (30 Units total) into the skin 2 (two) times daily. 03/07/22   Newlin, Enobong, MD   INSULIN  SYRINGE 1CC/29G (B-D INSULIN  SYRINGE) 29G X 1/2 1 ML MISC 1 application by Does not apply route at bedtime. 06/01/20   Celestia Rosaline SQUIBB, NP  losartan -hydrochlorothiazide  (HYZAAR) 100-25 MG tablet Take 1 tablet by mouth daily. 11/14/23   St Morton Sebastian Pool, NP    Allergies: Ace inhibitors    Review of Systems  Constitutional:  Negative for fever.  HENT:         Bump on left neck  Respiratory:  Negative for shortness of breath.   Cardiovascular:  Negative for chest pain.  Gastrointestinal:  Negative for abdominal pain, diarrhea and vomiting.  Musculoskeletal:  Negative for back pain.  Skin:  Negative for rash.  Neurological:  Negative for dizziness, facial asymmetry, speech difficulty, light-headedness and numbness.    Updated Vital Signs BP (!) 169/109 (BP Location: Right Arm)   Pulse 84   Temp 98.1 F (36.7 C)   Resp 18   SpO2 100%   Physical Exam Vitals and nursing note reviewed.  Constitutional:      General: She is not in acute distress.    Appearance: Normal appearance.  HENT:     Head: Normocephalic.     Mouth/Throat:     Mouth: Mucous membranes are moist.  Eyes:     Extraocular Movements: Extraocular movements intact.     Pupils: Pupils are equal, round, and reactive to light.  Neck:     Comments:  Approx 2 cm palpable mass to left lower lateral neck, around scalene musculature Cardiovascular:     Rate and Rhythm: Normal rate.  Pulmonary:     Effort: Pulmonary effort is normal. No respiratory distress.  Abdominal:     Palpations: Abdomen is soft.     Tenderness: There is no abdominal tenderness.  Skin:    General: Skin is warm.  Neurological:     Mental Status: She is alert and oriented to person, place, and time. Mental status is at baseline.  Psychiatric:        Mood and Affect: Mood normal.     (all labs ordered are listed, but only abnormal results are displayed) Labs Reviewed  COMPREHENSIVE METABOLIC PANEL WITH GFR - Abnormal;  Notable for the following components:      Result Value   Glucose, Bld 250 (*)    AST 12 (*)    All other components within normal limits  CBC WITH DIFFERENTIAL/PLATELET - Abnormal; Notable for the following components:   Hemoglobin 15.4 (*)    HCT 46.6 (*)    All other components within normal limits    EKG: None  Radiology: No results found.   Procedures   Medications Ordered in the ED - No data to display                                  Medical Decision Making Amount and/or Complexity of Data Reviewed Labs: ordered. Radiology: ordered.  Risk Prescription drug management.   58 year old female with recent right PCA stroke, on DAPT presents emergency department with swelling the left side the neck.  Noted this morning upon waking, tender.  Vitals are normal and stable.  No new neurodeficits or acute symptoms.  On exam she has a palpable firm area around the scalene musculature, tender to palpation, somewhat mobile.  No overlying skin changes, does not feel like an abscess, would be around lymph node chain.  Blood work is reassuring and CT scan confirms cervical lymphadenopathy with no other acute/concerning findings.  Patient has been educated and will plan for outpatient follow-up.  Patient at this time appears safe and stable for discharge and close outpatient follow up. Discharge plan and strict return to ED precautions discussed, patient verbalizes understanding and agreement.     Final diagnoses:  None    ED Discharge Orders     None          Bari Roxie HERO, DO 07/04/24 1224

## 2024-07-04 NOTE — Discharge Instructions (Signed)
 You have been seen and discharged from the emergency department.  Your imaging showed that what you are feeling in your neck is an enlarged lymph node.  No other findings of aneurysm, clot or other emergency.  Follow-up with your primary provider for further evaluation and further care. Take home medications as prescribed. If you have any worsening symptoms or further concerns for your health please return to an emergency department for further evaluation.

## 2024-07-04 NOTE — ED Triage Notes (Addendum)
 Pt had CVA a week ago. She woke up today and noticed a hard mass about inch long on her left side of neck. Tender to palpation. No fevers. No other concerning symptoms and has been taking her blood thinners as prescribed. Mass can be seen by this RN when she turns her head.

## 2024-07-23 ENCOUNTER — Encounter: Payer: Self-pay | Admitting: Adult Health

## 2024-07-23 ENCOUNTER — Ambulatory Visit (INDEPENDENT_AMBULATORY_CARE_PROVIDER_SITE_OTHER): Admitting: Adult Health

## 2024-07-23 VITALS — BP 140/88 | HR 90 | Ht 62.0 in | Wt 110.8 lb

## 2024-07-23 DIAGNOSIS — K119 Disease of salivary gland, unspecified: Secondary | ICD-10-CM

## 2024-07-23 DIAGNOSIS — I671 Cerebral aneurysm, nonruptured: Secondary | ICD-10-CM

## 2024-07-23 DIAGNOSIS — I639 Cerebral infarction, unspecified: Secondary | ICD-10-CM

## 2024-07-23 DIAGNOSIS — Z720 Tobacco use: Secondary | ICD-10-CM

## 2024-07-23 DIAGNOSIS — D6859 Other primary thrombophilia: Secondary | ICD-10-CM

## 2024-07-23 DIAGNOSIS — Z8673 Personal history of transient ischemic attack (TIA), and cerebral infarction without residual deficits: Secondary | ICD-10-CM | POA: Diagnosis not present

## 2024-07-23 DIAGNOSIS — Z09 Encounter for follow-up examination after completed treatment for conditions other than malignant neoplasm: Secondary | ICD-10-CM

## 2024-07-23 NOTE — Progress Notes (Signed)
 [facility] [address] [place]. [place] [address] [phone]       HOSPITAL FOLLOW UP NOTE  [name] Date of Birth:  1966/08/19 Medical Record Number:  [id number]   Reason for Referral:  hospital stroke follow up    SUBJECTIVE:   CHIEF COMPLAINT:  Chief Complaint  Patient presents with   Follow-up    RM 3, Pt alone, here for July stroke f/u. Pt states ok but vision is still off, mostly in left eye in peripheral vision has a blurry spot. Pt not driving and does not feel comfortable driving.    HPI:   [name] is a 58 y.o. female with history of 58 y.o. 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  Stroke workup revealed right PCA infarct likely secondary to small vessel disease given uncontrolled risk factors.  MRI also noted chronic cortical infarct in the left occipital lobe, new from prior MRI in 2020, also noted chronic left MCA infarcts.  Incidental finding of increased size of known left parotid lesion and advised ENT follow-up.  CTA head/neck negative LVO.  EF 55 to 60%.  LDL 134.  A1c 9.3.  Recommended DAPT for 3 weeks then Plavix  alone, on Plavix  PTA but questionable compliance.  Placed on atorvastatin  80 mg daily.  Advised outpatient follow-up with PCP for DM management.  Tobacco cessation counseling provided.  Of note, she was seen at [facility] in 05/2019 with episode of right occipital headache and diplopia.  Workup without acute stroke findings and noted symptoms spontaneously resolved.  She was discharged home in stable condition without therapy needs.   Today, 07/23/2024, patient is being seen for initial hospital follow-up unaccompanied.  Overall stable from stroke standpoint without new stroke/TIA symptoms.  Still reports some decreased vision bilaterally upper left periphery without much improvement  since discharge but denies any worsening. Describes it as a blurred circle.  Has not yet been seen by ophthalmologist.  Reports previously being seen by ophthalmology about 2 years ago but she does not wish to return to them.  Completed 3 weeks DAPT, remains on Plavix  alone without side effects.  Admits to noncompliance with atorvastatin , no specific reason, does have rx at home, she prefers to not take medications if it is not needed. After further discussion and indication for statin, she agrees to start.  She was recently seen by PCP and has follow-up visit in October.  She is scheduled for ENT evaluation next month.  Continued tobacco use but has been cutting back, currently smoking 5 cigarettes/day, previously 1/2 PPD.      PERTINENT IMAGING  CT showed right PCA infarct.   CT head and neck showed no LVO.  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  Chronic left occipital lobe infarct new from prior MRI in 2020.  Redemonstrated chronic left MCA infarct and unchanged chronic lacunar infarct in left cerebral hemispheric white matter and thalami.  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 2D Echo  EF 55-60% LDL 134 HgbA1c 9.3    ROS:   14 system review of systems performed and negative with exception of those listed in HPI  PMH:  Past  Medical History:  Diagnosis Date   Anemia    Diabetes mellitus without complication (HCC)    Hypertension    Stroke [facility])    x2; no deficits   Thyroid  nodule 10/02/2017    PSH:  Past Surgical History:  Procedure Laterality Date   ENDOMETRIAL ABLATION N/A 01/30/2018   Procedure: ENDOMETRIAL ABLATION WITH MINERVA;  Surgeon: Jayne Vonn DEL, MD;  Location: [facility];  Service: Gynecology;  Laterality: N/A;   HYSTEROSCOPY WITH D & C N/A 01/30/2018   Procedure: DILATATION AND CURETTAGE  /HYSTEROSCOPY;  Surgeon: Jayne Vonn DEL, MD;  Location: [facility];  Service: Gynecology;  Laterality: N/A;   LOOP RECORDER INSERTION N/A 06/27/2017   Procedure: Loop Recorder Insertion;  Surgeon: Inocencio Soyla Lunger, MD;  [facility]: [facility];  Service: Cardiovascular;  Laterality: N/A;   NERVE, TENDON AND ARTERY REPAIR Left 10/21/2019   Procedure: WOUND EXPLORATION LEFT HAND WITH REPAIR TENDON, ARTERY AND NERVE ;  Surgeon: Murrell Drivers, MD;  Location: [facility];  Service: Orthopedics;  Laterality: Left;   TEE WITHOUT CARDIOVERSION N/A 06/27/2017   Procedure: TRANSESOPHAGEAL ECHOCARDIOGRAM (TEE);  Surgeon: Mona Vinie BROCKS, MD;  Location: [facility];  Service: Cardiovascular;  Laterality: N/A;    Social History:  Social History   Socioeconomic History   Marital status: Married    Spouse name: Not on file   Number of children: Not on file   Years of education: Not on file   Highest education level: Not on file  Occupational History   Not on file  Tobacco Use   Smoking status: Every Day    Current packs/day: 0.25    Average packs/day: 0.5 packs/day for 24.9 years (12.4 ttl pk-yrs)    Types: Cigarettes    Start date: 08/29/1999   Smokeless tobacco: Never   Tobacco comments:    smoke 5 cigarettes a day  Vaping Use   Vaping status: Never Used  Substance and Sexual Activity   Alcohol use: No   Drug use: No   Sexual activity: Not Currently    Birth control/protection: None  Other Topics Concern   Not on file  Social History Narrative   Not on file   Social Drivers of Health   Financial Resource Strain: Not on file  Food Insecurity: No Food Insecurity (06/24/2024)   Hunger Vital Sign    Worried About Running Out of Food in the Last Year: Never true    Ran Out of Food in the Last Year: Never true  Transportation Needs: No Transportation Needs (06/24/2024)   PRAPARE - Administrator, Civil Service (Medical): No    Lack of Transportation  (Non-Medical): No  Physical Activity: Not on file  Stress: Not on file  Social Connections: Not on file  Intimate Partner Violence: Not At Risk (06/24/2024)   Humiliation, Afraid, Rape, and Kick questionnaire    Fear of Current or Ex-Partner: No    Emotionally Abused: No    Physically Abused: No    Sexually Abused: No    Family History:  Family History  Problem Relation Age of Onset   Hypotension Sister    Heart disease Brother    Stroke Maternal Grandfather     Medications:   Current Outpatient Medications on File Prior to Visit  Medication Sig Dispense Refill   amLODipine  (NORVASC ) 10 MG tablet TAKE ONE TABLET BY MOUTH DAILY 90 tablet 1   atorvastatin  (LIPITOR) 80 MG tablet Take 1 tablet (80 mg total) by  mouth at bedtime. 90 tablet 1   clopidogrel  (PLAVIX ) 75 MG tablet Take 1 tablet (75 mg total) by mouth daily. 90 tablet 0   fish oil -omega-3 fatty acids  1000 MG capsule Take 2 capsules (2 g total) by mouth daily. 180 capsule 1   glipiZIDE  (GLUCOTROL ) 10 MG tablet TAKE 1 TABLET (10 MG TOTAL) BY MOUTH 2 (TWO) TIMES DAILY BEFORE A MEAL. 180 tablet 3   insulin  glargine-yfgn (SEMGLEE , YFGN,) 100 UNIT/ML injection Inject 0.3 mLs (30 Units total) into the skin 2 (two) times daily. 20 mL 0   INSULIN  SYRINGE 1CC/29G (B-D INSULIN  SYRINGE) 29G X 1/2 1 ML MISC 1 application by Does not apply route at bedtime. 30 each 5   losartan -hydrochlorothiazide  (HYZAAR) 100-25 MG tablet Take 1 tablet by mouth daily. 90 tablet 0   No current facility-administered medications on file prior to visit.    Allergies:   Allergies  Allergen Reactions   Ace Inhibitors Cough      OBJECTIVE:  Physical Exam  Vitals:   07/23/24 1001  BP: (!) 140/88  Pulse: 90  Weight: 110 lb 12.8 oz (50.3 kg)  Height: 5' 2 (1.575 m)   Body mass index is 20.27 kg/m. No results found.  General: Frail very pleasant middle-aged African-American female, seated, in no evident distress  Neurologic Exam Mental  Status: Awake and fully alert.  Fluent speech and language.  Oriented to place and time. Recent and remote memory intact. Attention span, concentration and fund of knowledge appropriate. Mood and affect appropriate.  Cranial Nerves: Fundoscopic exam reveals sharp disc margins. Pupils equal, briskly reactive to light. Extraocular movements full without nystagmus. Visual fields full to confrontation although subjective decreased vision bilaterally mid outer periphery. Hearing intact. Facial sensation intact. Face, tongue, palate moves normally and symmetrically.  Motor: Normal bulk and tone. Normal strength in all tested extremity muscles Sensory.: intact to touch , pinprick , position and vibratory sensation.  Coordination: Rapid alternating movements normal in all extremities. Finger-to-nose and heel-to-shin performed accurately bilaterally. Gait and Station: Arises from chair without difficulty. Stance is normal. Gait demonstrates normal stride length and balance without use of AD.  Reflexes: 1+ and symmetric. Toes downgoing.     NIHSS  1 Modified Rankin  2      ASSESSMENT: Tammy Bauer is a 58 y.o. year old female with right PCA stroke on 06/21/2024 suspected secondary to small vessel disease given uncontrolled risk factors. Vascular risk factors include HTN, HLD, DM, medication noncompliance, hx of L MCA strokes x2 in 2018, tobacco use and cerebral aneurysm.      PLAN:  R PCA stroke:  Hx of stroke: Residual deficit: Partial left hemianopia.  Recommend evaluation by ophthalmology over the next 1 to 2 months if symptoms persist.  She was advised to call if she needs referral S/p ILR 2018-2022 without evidence of A-fib during monitoring Recent stroke suspected due to small vessel disease although based on size, question embolic etiology. Will obtain labs to rule out hypercoagulable disorder possibly contributing to recent stroke and prior strokes.  Continue Plavix  and atorvastatin  (Lipitor)  for secondary stroke prevention managed/prescribed by PCP.   Discussed secondary stroke prevention measures and importance of close PCP follow up for aggressive stroke risk factor management including BP goal<130/90, HLD with LDL goal<70 and DM with A1c.<7 .  Stroke labs 06/2024: LDL 134, A1c 9.3 I have gone over the pathophysiology of stroke, warning signs and symptoms, risk factors and their management in some detail with instructions to  go to the closest emergency room for symptoms of concern.  Tobacco use:  Discussed importance of complete tobacco cessation as continued use greatly increases risk of additional strokes, risk of aneurysm enlargement/rupture and multiple other health related concerns  Cerebral aneurysm: Recommend repeat imaging in 06/2026 CTA 06/2024 unchanged 3 mm aneurysm left MCA bifurcation MRA 05/2019 3-4 aneurysm from left MCA CTA 09/2017 3x73mm L MCA bifurcation aneurysm  Parotid gland lesion: Recent imaging showing enlargement compared to prior imaging Scheduled for ENT evaluation next week, she question need for this and was encouraged to proceed    Follow up in 1 year or call earlier if needed   CC:  [facility] provider: Dr. Rosemarie PCP: Deitra Morton Sebastian Nena, NP    I personally spent a total of 65 minutes in the care of the patient today including preparing to see the patient, getting/reviewing separately obtained history, performing a medically appropriate exam/evaluation, counseling and educating, placing orders, and documenting clinical information in the EHR.  Tammy Bauer, AGNP-BC  [facility] [address] [place], [place] [address]  Phone [phone] Fax [phone] Note: This document was prepared with digital dictation and possible smart phrase technology. Any transcriptional errors that result from this process are unintentional.

## 2024-07-23 NOTE — Patient Instructions (Addendum)
 Recommend follow up with ophthalmology in the next 1-2 months if your symptoms do not improve  Will check lab work today to look for a clotting disorder possibly contributing to your recurrent strokes  Continue working on cutting back on smoking with goal of completely quitting as ongoing use greatly increases risk of future strokes   Continue clopidogrel  75 mg daily and start atorvastatin  (Lipitor) 80mg  daily  for secondary stroke prevention - ongoing refills can be obtained by your PCP   Continue to follow up with PCP regarding blood pressure, cholesterol and diabetes management  Maintain strict control of hypertension with blood pressure goal below 130/90, diabetes with hemoglobin A1c goal below 7.0 % and cholesterol with LDL cholesterol (bad cholesterol) goal below 70 mg/dL.   Signs of a Stroke? Follow the BEFAST method:  Balance Watch for a sudden loss of balance, trouble with coordination or vertigo Eyes Is there a sudden loss of vision in one or both eyes? Or double vision?  Face: Ask the person to smile. Does one side of the face droop or is it numb?  Arms: Ask the person to raise both arms. Does one arm drift downward? Is there weakness or numbness of a leg? Speech: Ask the person to repeat a simple phrase. Does the speech sound slurred/strange? Is the person confused ? Time: If you observe any of these signs, call 911.      Followup in the future with me in 1 year or call earlier if needed      Thank you for coming to see us  at [facility]. I hope we have been able to provide you high quality care today.  You may receive a patient satisfaction survey over the next few weeks. We would appreciate your feedback and comments so that we may continue to improve ourselves and the health of our patients.

## 2024-07-27 NOTE — Progress Notes (Signed)
 I agree with the above plan

## 2024-07-31 ENCOUNTER — Ambulatory Visit: Payer: Self-pay | Admitting: Adult Health

## 2024-08-11 LAB — FACTOR 5 LEIDEN

## 2024-08-11 LAB — HYPERCOAGULABLE PANEL, COMPREHENSIVE
APTT: 28 s
AT III Act/Nor PPP Chro: 140 % — ABNORMAL HIGH
Act. Prt C Resist w/FV Defic.: 2.7 ratio
Anticardiolipin Ab, IgG: <10 [GPL'U]
Anticardiolipin Ab, IgM: <10 [MPL'U]
Beta-2 Glycoprotein I, IgA: <10 SAU
Beta-2 Glycoprotein I, IgG: <10 SGU
Beta-2 Glycoprotein I, IgM: <10 SMU
DRVVT Screen Seconds: 46.1 s
Factor VII Antigen**: 79 %
Factor VIII Activity: 68 %
Hexagonal Phospholipid Neutral: 4 s
Homocysteine: 8.4 umol/L
Prot C Ag Act/Nor PPP Imm: 113 %
Prot S Ag Act/Nor PPP Imm: 124 %
Protein C Ag/FVII Ag Ratio**: 1.4 ratio
Protein S Ag/FVII Ag Ratio**: 1.6 ratio

## 2024-08-11 LAB — CARDIOLIPIN ANTIBODIES, IGM+IGG
Anticardiolipin IgG: <9 GPL U/mL (ref 0–14)
Anticardiolipin IgM: 10 [MPL'U]/mL (ref 0–12)

## 2024-08-11 LAB — SICKLE CELL SCREEN: Sickle Cell Screen: NEGATIVE

## 2024-08-11 LAB — RHEUMATOID FACTOR: Rheumatoid fact SerPl-aCnc: 12 [IU]/mL (ref <14.0)

## 2024-08-11 LAB — PROTEIN S PANEL
Protein S Activity: 121 % (ref 63–140)
Protein S Ag, Free: 131 % (ref 61–136)
Protein S Ag, Total: 135 % (ref 60–150)

## 2024-08-11 LAB — LUPUS ANTICOAGULANT
Dilute Viper Venom Time: 38.1 s (ref 0.0–47.0)
PTT Lupus Anticoagulant: 33.9 s (ref 0.0–43.5)
Thrombin Time: 18.2 s (ref 0.0–23.0)
dPT Confirm Ratio: 1.18 ratio (ref 0.00–1.34)
dPT: 36.3 s (ref 0.0–47.6)

## 2024-08-11 LAB — BETA-2-GLYCOPROTEIN I ABS, IGG/M/A
Beta-2 Glyco 1 IgA: <9 GPI IgA units (ref 0–25)
Beta-2 Glyco 1 IgM: <9 GPI IgM units (ref 0–32)
Beta-2 Glyco I IgG: <9 GPI IgG units (ref 0–20)

## 2024-08-11 LAB — ANA W/REFLEX: Anti Nuclear Antibody (ANA): NEGATIVE

## 2024-08-11 LAB — PROTEIN C ACTIVITY: Protein C Activity: 149 % (ref 73–180)

## 2024-08-11 LAB — ANTITHROMBIN III: AntiThromb III Func: 139 % — ABNORMAL HIGH (ref 75–135)

## 2024-08-27 ENCOUNTER — Encounter (INDEPENDENT_AMBULATORY_CARE_PROVIDER_SITE_OTHER): Payer: Self-pay | Admitting: Otolaryngology

## 2024-08-27 ENCOUNTER — Ambulatory Visit (INDEPENDENT_AMBULATORY_CARE_PROVIDER_SITE_OTHER): Admitting: Otolaryngology

## 2024-08-27 VITALS — BP 185/100 | HR 79 | Ht 62.0 in

## 2024-08-27 DIAGNOSIS — E042 Nontoxic multinodular goiter: Secondary | ICD-10-CM

## 2024-08-27 DIAGNOSIS — K118 Other diseases of salivary glands: Secondary | ICD-10-CM | POA: Diagnosis not present

## 2024-08-27 NOTE — Progress Notes (Signed)
 Dear Dr. Raenelle, Here is my assessment for our mutual patient, Tammy Bauer. Thank you for allowing me the opportunity to care for your patient. Please do not hesitate to contact me should you have any other questions. Sincerely, Dr. Eldora Blanch  Otolaryngology Clinic Note Referring provider: Dr. Raenelle HPI:  Tammy Bauer is a 58 y.o. female kindly referred by Dr. Raenelle for evaluation of parotid lesion  Initial visit (08/2024): She reports that she has not noted any parotid lesion. Incidentally found parotid lesion on left during imaging for stroke. Perhaps slightly increased in size. She cannot feel it. No facial pain, numbness, weakness. No skin cancers. She does smoke. She has not noted any neck masses, but did have a lymph node she was evaluated in ED for in July without noted significant adenopathy. Does have h/o thyroid  nodules but without compressive symptoms, hypo or hyperthryoid symptoms that she is following with her PCP. No parotitis, no facial swelling episodes. No prior FNA of parotid or nodules. No HIV  ENT Surgery: no Personal or FHx of bleeding dz or anesthesia difficulty: no  AP/AC: ASA/Plavix   Tobacco: yes, 0.5 PPD (over 30 years)  PMHx: DM, HTN, Stroke  Independent Review of Additional Tests or Records:  Dr. Bari (08/2024): noted mass along left neck; tender; Dx: lymphadenopathy; plan outpatient f/u Dr. Raenelle (06/23/2024) Discharge notes: noted ~2 cm parotid cystic lesion; ref to ENT CT Neck 07/04/2024 independently interpreted:noted left parotid cystic lesion (~1.7 cm), slight assymmetry left parotid; no noted right parotid or SMG nodules; Thyroid : multinodular goiter; mild b/l cervical LAD -- noted parotid lesion also noted in October 2018 TSH 03/01/2023: wnl, FT4: wnl Thyroid  US  06/16/2024:  PMH/Meds/All/SocHx/FamHx/ROS:   Past Medical History:  Diagnosis Date   Anemia    Diabetes mellitus without complication (HCC)    Hypertension    Stroke (HCC)    x2; no  deficits   Thyroid  nodule 10/02/2017     Past Surgical History:  Procedure Laterality Date   ENDOMETRIAL ABLATION N/A 01/30/2018   Procedure: ENDOMETRIAL ABLATION WITH MINERVA;  Surgeon: Jayne Vonn DEL, MD;  Location: [facility];  Service: Gynecology;  Laterality: N/A;   HYSTEROSCOPY WITH D & C N/A 01/30/2018   Procedure: DILATATION AND CURETTAGE /HYSTEROSCOPY;  Surgeon: Jayne Vonn DEL, MD;  Location: [facility];  Service: Gynecology;  Laterality: N/A;   LOOP RECORDER INSERTION N/A 06/27/2017   Procedure: Loop Recorder Insertion;  Surgeon: Inocencio Soyla Lunger, MD;  Location: [facility];  Service: Cardiovascular;  Laterality: N/A;   NERVE, TENDON AND ARTERY REPAIR Left 10/21/2019   Procedure: WOUND EXPLORATION LEFT HAND WITH REPAIR TENDON, ARTERY AND NERVE ;  Surgeon: Murrell Drivers, MD;  Location: [facility];  Service: Orthopedics;  Laterality: Left;   TEE WITHOUT CARDIOVERSION N/A 06/27/2017   Procedure: TRANSESOPHAGEAL ECHOCARDIOGRAM (TEE);  Surgeon: Mona Vinie BROCKS, MD;  Location: [facility] END[facility]COPY;  Service: Cardiovascular;  Laterality: N/A;    Family History  Problem Relation Age of Onset   Hypotension Sister    Heart disease Brother    Stroke Maternal Grandfather      Social Connections: Not on file      Current Outpatient Medications:    amLODipine  (NORVASC ) 10 MG tablet, TAKE ONE TABLET BY MOUTH DAILY, Disp: 90 tablet, Rfl: 1   atorvastatin  (LIPITOR) 80 MG tablet, Take 1 tablet (80 mg total) by mouth at bedtime., Disp: 90 tablet, Rfl: 1   clopidogrel  (PLAVIX ) 75 MG tablet, Take 1 tablet (75 mg total) by  mouth daily., Disp: 90 tablet, Rfl: 0   fish oil -omega-3 fatty acids  1000 MG capsule, Take 2 capsules (2 g total) by mouth daily., Disp: 180 capsule, Rfl: 1   glipiZIDE  (GLUCOTROL ) 10 MG tablet, TAKE 1 TABLET (10 MG TOTAL) BY MOUTH 2 (TWO) TIMES DAILY BEFORE A MEAL., Disp: 180 tablet, Rfl: 3   insulin  glargine-yfgn (SEMGLEE , YFGN,) 100 UNIT/ML injection, Inject 0.3 mLs  (30 Units total) into the skin 2 (two) times daily., Disp: 20 mL, Rfl: 0   INSULIN  SYRINGE 1CC/29G (B-D INSULIN  SYRINGE) 29G X 1/2 1 ML MISC, 1 application by Does not apply route at bedtime., Disp: 30 each, Rfl: 5   losartan -hydrochlorothiazide  (HYZAAR) 100-25 MG tablet, Take 1 tablet by mouth daily., Disp: 90 tablet, Rfl: 0   Physical Exam:   BP (!) 185/100 (BP Location: Left Arm, Patient Position: Sitting, Cuff Size: Normal)   Pulse 79   Ht 5' 2 (1.575 m)   SpO2 97%   BMI 20.27 kg/m   Salient findings:  CN II-XII intact Bilateral EAC clear and TM intact with well pneumatized middle ear spaces Anterior rhinoscopy: Septum intact; bilateral inferior turbinates without significant hypertrophy No lesions of oral cavity/oropharynx; dentition poor; easily expressible saliva b/l parotid ducts No obviously palpable neck masses/lymphadenopathy - no palpable parotid nodules; diffuse thyromegaly with noted nodules No skin lesions appreciated No respiratory distress or stridor; voice strong, no dysphonia, quality 1  Seprately Identifiable Procedures:  Prior to initiating any procedures, risks/benefits/alternatives were explained to the patient and verbal consent obtained. None  Impression & Plans:  Tammy Bauer is a 58 y.o. female with:  1. Parotid nodule   2. Multiple thyroid  nodules    Noted left parotid cyst or nodule, noted since at least 2018; no other symptoms; slight growth; no noted worrisome adenopathy. We discussed options and given her recent medical problems, she would like to observe rather than undergo FNA. Will f/u in 1 year with US ; return precautions discussed Thyroid  nodules: also appear stable in size; normal TFTs; also discussed FNA, deferred;  See below regarding exact medications prescribed this encounter including dosages and route: No orders of the defined types were placed in this encounter.     Thank you for allowing me the opportunity to care for your patient.  Please do not hesitate to contact me should you have any other questions.  Sincerely, Eldora Blanch, MD Otolaryngologist (ENT), [facility] Phone: [phone] Fax: [phone]  08/27/2024, 10:58 AM   MDM:  Level 4 - [id number] Complexity/Problems addressed: mod - chronic problems, multiple Data complexity: mod - independent review of notes, labs, independent interpretation of imaging - Morbidity: low  - Prescription Drug prescribed or managed: no

## 2024-09-08 ENCOUNTER — Other Ambulatory Visit: Payer: Self-pay | Admitting: Nurse Practitioner

## 2024-09-08 DIAGNOSIS — I10 Essential (primary) hypertension: Secondary | ICD-10-CM

## 2024-09-08 DIAGNOSIS — I152 Hypertension secondary to endocrine disorders: Secondary | ICD-10-CM

## 2024-09-25 NOTE — Progress Notes (Addendum)
 Subjective:  Patient ID: Tammy Bauer, female    DOB: [birth year], 58 y.o.   MRN: [id number]  Patient Care Team: Deitra Morton Sebastian Nena, NP as PCP - General (Nurse Practitioner)   Chief Complaint:  Medical Management of Chronic Issues (3 month)   HPI: Tammy Bauer is a 58 y.o. female presenting on 09/29/2024 for Medical Management of Chronic Issues (3 month)   Discussed the use of AI scribe software for clinical note transcription with the patient, who gave verbal consent to proceed.  History of Present Illness Tammy Bauer is a 58 year old female with a history of stroke who presents with persistent blurry vision.  She was hospitalized in August for a stroke and has experienced persistent blurry vision in both eyes since the event. She describes seeing a 'dot' and overall blurriness that has not improved over time. It has been more than a month without improvement, and she has not yet seen an eye doctor.  She has a history of diabetes with blood glucose levels running high. During her hospital stay in August, her blood glucose was 215 mg/dL. She recently started on Ozempic, but is unsure of the dose. She also takes insulin  twice a day and Farxiga. Her last A1c was 9.3% in July, and it is now 8.7%. She is concerned about weight loss associated with Ozempic, noting her current weight is 115 pounds.  Her blood pressure today is 157/89 mmHg. She is currently on amlodipine  10 mg daily and Hyzaar 100/25 mg. She reports no side effects from these medications. Her blood pressure was previously 185/100 mmHg in September. She acknowledges consuming salt in her diet.  She smokes about half a pack of cigarettes per day and not ready t quit She has history of hyperlipidemia current being managed with Lipitor, denies side effects from the medication     09/29/2024   10:25 AM 06/24/2024    2:00 PM 11/14/2023    9:41 AM  PHQ9 SCORE ONLY  PHQ-9 Total Score 0 0  0      Data saved with a previous  flowsheet row definition       09/29/2024   10:25 AM 06/24/2024    2:00 PM 11/14/2023    9:41 AM 04/03/2023    9:26 AM  GAD 7 : Generalized Anxiety Score  Nervous, Anxious, on Edge 0 0 0 0  Control/stop worrying 0 0 0 0  Worry too much - different things 0 0 0 0  Trouble relaxing 0 0 0 0  Restless 0 0 0 0  Easily annoyed or irritable 0 0 0 0  Afraid - awful might happen 0  0 0  Total GAD 7 Score 0  0 0  Anxiety Difficulty Not difficult at all  Not difficult at all Not difficult at all      Relevant past medical, surgical, family, and social history reviewed and updated as indicated.  Allergies and medications reviewed and updated. Data reviewed: Chart in Epic.   Past Medical History:  Diagnosis Date   Anemia    Diabetes mellitus without complication (HCC)    Hypertension    Stroke [facility])    x2; no deficits   Thyroid  nodule 10/02/2017    Past Surgical History:  Procedure Laterality Date   ENDOMETRIAL ABLATION N/A 01/30/2018   Procedure: ENDOMETRIAL ABLATION WITH MINERVA;  Surgeon: Jayne Vonn DEL, MD;  Location: [facility];  Service: Gynecology;  Laterality: N/A;   HYSTEROSCOPY WITH D & C N/A  01/30/2018   Procedure: DILATATION AND CURETTAGE /HYSTEROSCOPY;  Surgeon: Jayne Vonn DEL, MD;  Location: [facility];  Service: Gynecology;  Laterality: N/A;   LOOP RECORDER INSERTION N/A 06/27/2017   Procedure: Loop Recorder Insertion;  Surgeon: Inocencio Soyla Lunger, MD;  Location: [facility];  Service: Cardiovascular;  Laterality: N/A;   NERVE, TENDON AND ARTERY REPAIR Left 10/21/2019   Procedure: WOUND EXPLORATION LEFT HAND WITH REPAIR TENDON, ARTERY AND NERVE ;  Surgeon: Murrell Drivers, MD;  Location: [facility];  Service: Orthopedics;  Laterality: Left;   TEE WITHOUT CARDIOVERSION N/A 06/27/2017   Procedure: TRANSESOPHAGEAL ECHOCARDIOGRAM (TEE);  Surgeon: Mona Vinie BROCKS, MD;  Location: [facility] ENDOSCOPY;  Service: Cardiovascular;  Laterality: N/A;    Social History    Socioeconomic History   Marital status: Married    Spouse name: Not on file   Number of children: Not on file   Years of education: Not on file   Highest education level: Not on file  Occupational History   Not on file  Tobacco Use   Smoking status: Every Day    Current packs/day: 0.25    Average packs/day: 0.5 packs/day for 25.1 years (12.5 ttl pk-yrs)    Types: Cigarettes    Start date: 08/29/1999   Smokeless tobacco: Never   Tobacco comments:    smoke 5 cigarettes a day  Vaping Use   Vaping status: Never Used  Substance and Sexual Activity   Alcohol use: No   Drug use: No   Sexual activity: Not Currently    Birth control/protection: None  Other Topics Concern   Not on file  Social History Narrative   Not on file   Social Drivers of Health   Financial Resource Strain: Not on file  Food Insecurity: No Food Insecurity (06/24/2024)   Hunger Vital Sign    Worried About Running Out of Food in the Last Year: Never true    Ran Out of Food in the Last Year: Never true  Transportation Needs: No Transportation Needs (06/24/2024)   PRAPARE - Administrator, Civil Service (Medical): No    Lack of Transportation (Non-Medical): No  Physical Activity: Not on file  Stress: Not on file  Social Connections: Not on file  Intimate Partner Violence: Not At Risk (06/24/2024)   Humiliation, Afraid, Rape, and Kick questionnaire    Fear of Current or Ex-Partner: No    Emotionally Abused: No    Physically Abused: No    Sexually Abused: No    Outpatient Encounter Medications as of 09/29/2024  Medication Sig   amLODipine -valsartan (EXFORGE) 10-320 MG tablet Take 1 tablet by mouth daily.   atorvastatin  (LIPITOR) 80 MG tablet Take 1 tablet (80 mg total) by mouth at bedtime.   clopidogrel  (PLAVIX ) 75 MG tablet Take 1 tablet (75 mg total) by mouth daily.   fish oil -omega-3 fatty acids  1000 MG capsule Take 2 capsules (2 g total) by mouth daily.   glipiZIDE  (GLUCOTROL ) 10 MG  tablet TAKE 1 TABLET (10 MG TOTAL) BY MOUTH 2 (TWO) TIMES DAILY BEFORE A MEAL.   hydrochlorothiazide  (HYDRODIURIL ) 50 MG tablet Take 1 tablet (50 mg total) by mouth daily.   insulin  glargine-yfgn (SEMGLEE , YFGN,) 100 UNIT/ML injection Inject 0.3 mLs (30 Units total) into the skin 2 (two) times daily.   INSULIN  SYRINGE 1CC/29G (B-D INSULIN  SYRINGE) 29G X 1/2 1 ML MISC 1 application by Does not apply route at bedtime.   [DISCONTINUED] amLODipine  (NORVASC ) 10 MG tablet TAKE  ONE TABLET BY MOUTH DAILY   [DISCONTINUED] losartan -hydrochlorothiazide  (HYZAAR) 100-25 MG tablet TAKE ONE TABLET BY MOUTH DAILY   No facility-administered encounter medications on file as of 09/29/2024.    Allergies  Allergen Reactions   Ace Inhibitors Cough    Pertinent ROS per HPI, otherwise unremarkable      Objective:  BP (!) 150/80   Pulse 83   Temp (!) 97.3 F (36.3 C) (Temporal)   Ht 5' 2 (1.575 m)   Wt 115 lb 6.4 oz (52.3 kg)   SpO2 100%   BMI 21.11 kg/m    Wt Readings from Last 3 Encounters:  09/29/24 115 lb 6.4 oz (52.3 kg)  07/23/24 110 lb 12.8 oz (50.3 kg)  06/26/24 105 lb 12.8 oz (48 kg)   BP Readings from Last 3 Encounters:  09/29/24 (!) 150/80  08/27/24 (!) 185/100  07/23/24 (!) 140/88     Physical Exam Vitals reviewed.  Constitutional:      General: She is not in acute distress.    Appearance: Normal appearance.  HENT:     Head: Normocephalic and atraumatic.     Nose: Nose normal.     Mouth/Throat:     Mouth: Mucous membranes are moist.  Eyes:     Extraocular Movements: Extraocular movements intact.     Conjunctiva/sclera: Conjunctivae normal.     Pupils: Pupils are equal, round, and reactive to light.  Cardiovascular:     Heart sounds: Normal heart sounds.  Pulmonary:     Effort: Pulmonary effort is normal.     Breath sounds: Normal breath sounds.  Musculoskeletal:        General: Normal range of motion.     Right lower leg: No edema.     Left lower leg: No edema.   Skin:    General: Skin is warm and dry.     Findings: No rash.  Neurological:     Mental Status: She is alert and oriented to person, place, and time.  Psychiatric:        Mood and Affect: Mood normal.        Behavior: Behavior normal.        Thought Content: Thought content normal.        Judgment: Judgment normal.    Physical Exam VITALS: BP- 157/89 MEASUREMENTS: Weight- 115.     Results for orders placed or performed in visit on 07/23/24  Hypercoagulable panel, comprehensive   Collection Time: 07/23/24 10:55 AM  Result Value Ref Range   Homocysteine 8.4 umol/L   Factor VIII Activity 68 %   AT III Act/Nor PPP Chro 140 (H) %   Prot C Ag Act/Nor PPP Imm 113 %   Prot S Ag Act/Nor PPP Imm 124 %   Factor VII Antigen** 79 %   Protein C Ag/FVII Ag Ratio** 1.4 ratio   Protein S Ag/FVII Ag Ratio** 1.6 ratio   Act. Prt C Resist w/FV Defic. 2.7 ratio   APTT 28.0 sec   APTT 1:1 NP CANCELED sec   APTT 1:1 Saline CANCELED sec   LAC Interpretation Comment    DRVVT Screen Seconds 46.1 sec   DRVVT Confirm Seconds CANCELED sec   DRVVT Ratio CANCELED ratio   Hexagonal Phospholipid Neutral 4 sec   Anticardiolipin Ab, IgG <10 GPL   Anticardiolipin Ab, IgM <10 MPL   Beta-2  Glycoprotein I, IgG <10 SGU   Beta-2  Glycoprotein I, IgM <10 SMU   Beta-2  Glycoprotein I, IgA <10 SAU   Pathologist Interpretation  Comment    Factor II Gene Mutation Result Comment    Interpretation: Comment    Methodology: Comment    Comments: Comment   Factor 5 leiden   Collection Time: 07/23/24 10:55 AM  Result Value Ref Range   Factor V Leiden Comment    Reviewed By: Comment   PROTEIN S PANEL   Collection Time: 07/23/24 10:55 AM  Result Value Ref Range   Protein S Ag, Total 135 60 - 150 %   Protein S Ag, Free 131 61 - 136 %   Protein S Activity 121 63 - 140 %  Protein C activity   Collection Time: 07/23/24 10:55 AM  Result Value Ref Range   Protein C Activity 149 73 - 180 %  Lupus anticoagulant    Collection Time: 07/23/24 10:55 AM  Result Value Ref Range   dPT 36.3 0.0 - 47.6 sec   dPT Confirm Ratio 1.18 0.00 - 1.34 Ratio   Thrombin  Time 18.2 0.0 - 23.0 sec   PTT Lupus Anticoagulant 33.9 0.0 - 43.5 sec   Dilute Viper Venom Time 38.1 0.0 - 47.0 sec   Lupus Anticoag Interp Comment:   ANA w/Reflex   Collection Time: 07/23/24 10:55 AM  Result Value Ref Range   Anti Nuclear Antibody (ANA) Negative Negative  Rheumatoid factor   Collection Time: 07/23/24 10:55 AM  Result Value Ref Range   Rheumatoid fact SerPl-aCnc 12.0 <14.0 IU/mL  Sickle Cell Screen   Collection Time: 07/23/24 10:55 AM  Result Value Ref Range   Sickle Cell Screen Negative Negative  Antithrombin III    Collection Time: 07/23/24 10:55 AM  Result Value Ref Range   AntiThromb III Func 139 (H) 75 - 135 %  Beta-2 -glycoprotein i abs, IgG/M/A   Collection Time: 07/23/24 10:55 AM  Result Value Ref Range   Beta-2  Glyco I IgG <9 0 - 20 GPI IgG units   Beta-2  Glyco 1 IgA <9 0 - 25 GPI IgA units   Beta-2  Glyco 1 IgM <9 0 - 32 GPI IgM units  Cardiolipin antibodies, IgM+IgG   Collection Time: 07/23/24 10:55 AM  Result Value Ref Range   Anticardiolipin IgG <9 0 - 14 GPL U/mL   Anticardiolipin IgM 10 0 - 12 MPL U/mL       Pertinent labs & imaging results that were available during my care of the patient were reviewed by me and considered in my medical decision making.  Assessment & Plan:  Dellar was seen today for medical management of chronic issues.  Diagnoses and all orders for this visit:  Hypertension associated with diabetes (HCC) -     Microalbumin / creatinine urine ratio -     amLODipine -valsartan (EXFORGE) 10-320 MG tablet; Take 1 tablet by mouth daily. -     hydrochlorothiazide  (HYDRODIURIL ) 50 MG tablet; Take 1 tablet (50 mg total) by mouth daily.  Blurry vision, bilateral -     Ambulatory referral to Optometry  Type 2 diabetes mellitus with diabetic peripheral angiopathy and gangrene, with long-term  current use of insulin  (HCC) -     Microalbumin / creatinine urine ratio -     Bayer DCA Hb A1c Waived  Type 2 diabetes mellitus with other circulatory complication, with long-term current use of insulin  (HCC)  Hyperlipidemia associated with type 2 diabetes mellitus (HCC)  Tobacco abuse  Anemia, unspecified type  TIA (transient ischemic attack) -     Ambulatory referral to Optometry  Screening for colon cancer -  Cologuard  Screening mammogram for breast cancer -     MM 3D SCREENING MAMMOGRAM BILATERAL BREAST     Assessment and Plan Davinity is a 58 year old African-American female seen today for chronic disease management, no acute distress Assessment & Plan Hypertension, uncontrolled Uncontrolled hypertension with BP 157/89 mmHg. High salt intake contributing. Risk of recurrent stroke. - Discontinue amlodipine  and Hyzaar - Prescribe Exforge and hydrochlorothiazide . - Advise reduction of salt and fried food intake. - Follow up in two weeks for BP check. - Instruct to obtain a home BP monitor.  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 High BP may exacerbate symptoms. - Refer to optometry for evaluation. - Emphasized importance of controlling BP to prevent worsening of visual symptoms. - Continue Plavix  75 mg daily, no refill needed   Type 2 diabetes mellitus, poorly controlled Type 2 diabetes with A1c 8.7. High blood glucose. Unhappy with Ozempic due to weight loss. - Stop Ozempic. - Continue insulin , Farxiga, and glipizide . - Discuss weight concerns and medication adjustments with endocrinologist on November 14. - Diabetic foot exam completed - Microalbumin ordered today  Hyperlipidemia Continue Lipitor 80 mg daily no refill needed  Tobacco abuse Smoking Cessation Counseling 3-Minutes Discussed smoking history and assessed readiness to quit. Briefly educated patient on health risks  of continued smoking, including increased risk of cardiovascular disease, respiratory conditions, and cancer. Explored motivation to quit and identified potential barriers  Provided brief counseling on behavioral strategies: Avoid triggers  alcohol, certain social settings. Deep breathing and relaxation techniques during cravings. Offered support resources: 1-800-QUIT-NOW or smokefree.gov Mobile apps such as QuitGuide or QuitStart Discussed pharmacotherapy options: Patient expressed no interest in quitting at this time. Will reassess readiness and offer continued support at future visits.   Health maintenance Mammogram and Cologuard ordered, declined cervical screening  Continue all other maintenance medications.  Follow up plan: Return in about 2 weeks (around 10/13/2024) for Hypertension.   Continue healthy lifestyle choices, including diet (rich in fruits, vegetables, and lean proteins, and low in salt and simple carbohydrates) and exercise (at least 30 minutes of moderate physical activity daily).  Educational handout given for    Clinical References  Anemia  Anemia is a condition in which there are not enough red blood cells or hemoglobin in the blood. Hemoglobin is a substance in red blood cells that carries oxygen . When you do not have enough red blood cells or hemoglobin (are anemic), your body cannot get enough oxygen , and your organs may not work properly. As a result, you may feel very tired or have other problems. What are the causes? Common causes of anemia include: Excessive bleeding. Anemia can be caused by excessive bleeding inside or outside the body, including bleeding from the intestines or from heavy menstrual periods in females. Poor nutrition. Long-lasting (chronic) kidney, thyroid , and liver disease. Bone marrow disorders, spleen problems, and blood disorders. Cancer and treatments for cancer. Human immunodeficiency virus (HIV) and acquired  immunodeficiency syndrome (AIDS). Infections, medicines, and autoimmune disorders that destroy red blood cells. What are the signs or symptoms? Symptoms of this condition include: Minor weakness. Dizziness. Headache, or difficulties concentrating and sleeping. Heartbeats that feel irregular or faster than normal (palpitations). Shortness of breath, especially with exercise. Pale skin, lips, and nails, or cold hands and feet. Upset stomach (indigestion) and nausea. Symptoms may occur suddenly or develop slowly. If your anemia is mild, you may not have symptoms. How is this diagnosed? This condition is diagnosed based on  blood tests, your medical history, and a physical exam. In some cases, a test may be needed in which cells are removed from the soft tissue inside of a bone and looked at under a microscope (bone marrow biopsy). Your health care provider may also check your stool (feces) for blood and may do more testing to look for the cause of your bleeding. Other tests may include: Imaging tests, such as a CT scan or MRI. A procedure to see inside your esophagus and stomach (endoscopy). The esophagus is the part of the body that moves food from your mouth to your stomach. A procedure to see inside your colon and rectum (colonoscopy). How is this treated? Treatment for this condition depends on the cause. If you continue to lose a lot of blood, you may need to be treated at a hospital. Treatment may include: Taking supplements of iron, vitamin B12, or folic acid. Taking a hormone medicine (erythropoietin) that can help to stimulate red blood cell growth. Receiving donated blood through an IV (blood transfusion). This may be needed if you lose a lot of blood. Making changes to your diet. Having surgery to remove your spleen. Follow these instructions at home: Take over-the-counter and prescription medicines only as told by your health care provider. Take supplements only as told by your  health care provider. Follow any diet instructions that you were given by your health care provider. Keep all follow-up visits. Your health care provider will want to recheck your blood tests. Contact a health care provider if: You develop new bleeding anywhere in the body. You are very weak. Get help right away if: You are short of breath. You have pain in your abdomen or chest. You are dizzy or feel faint. You have trouble concentrating. You have bloody stools, black stools, or tarry stools. You vomit repeatedly or you vomit up blood. These symptoms may be an emergency. Get help right away. Call 911. Do not wait to see if the symptoms will go away. Do not drive yourself to the hospital. Summary Anemia is a condition in which you do not have enough red blood cells or enough of a substance in your red blood cells that carries oxygen . Symptoms may occur suddenly or develop slowly. If your anemia is mild, you may not have symptoms. This condition is diagnosed with blood tests, a medical history, and a physical exam. Other tests may be needed. Treatment for this condition depends on the cause of the anemia. This information is not intended to replace advice given to you by your health care provider. Make sure you discuss any questions you have with your health care provider. Document Revised: 02/13/2022 Document Reviewed: 02/13/2022 Elsevier Patient Education  2024 [employer] Inc. Blurred Vision, Adult     Having blurred vision means that you cannot see things clearly. Your vision may seem fuzzy or out of focus. It can involve your vision for objects that are close or far away. It may affect one or both eyes. There are many causes of blurred vision, including cataracts, macular degeneration, eye inflammation (uveitis), and diabetic retinopathy. In many cases, blurred vision has to do with the shape of your eye. An abnormal eye shape means you cannot focus well (refractive error). When this  happens, it can cause: Faraway objects to look blurry (nearsightedness). Close objects to look blurry (farsightedness). Blurry vision at any distance (astigmatism). Refractive errors are often corrected with glasses or contacts. If you have blurred vision, it is best to see an  eye care specialist. Blurred vision can be diagnosed based on your symptoms and a complete eye exam. Tell your eye care specialist about any other health problems you have, any recent eye injury, and any prior surgeries. You may need to see an eye care specialist who specializes in medical and surgical eye problems (ophthalmologist). Your treatment will depend on what is causing your blurred vision. Follow these instructions at home: Keep all follow-up visits. This is important. These include any visits to your eye specialists. Do not drive or use machinery if your vision is blurry. Use eye drops only as told by your health care provider. If you were prescribed glasses or contact lenses, wear the glasses or contacts as told by your health care provider. Schedule eye exams regularly. Pay attention to any changes in your symptoms. Avoid rubbing your eyes. Contact a health care provider if: Your symptoms do not improve or they get worse. You have: New symptoms. A headache. Trouble seeing at night. Trouble noticing the difference between colors. You notice: Drooping of your eyelids. Drainage coming from your eyes. A rash around your eyes. Get help right away if: You have: Severe eye pain. A severe headache. A sudden change in vision. A sudden loss of vision. A vision change after an injury. You notice flashing lights in your field of vision. Your field of vision is the area that you can see without moving your eyes. Summary Having blurred vision means that you cannot see things clearly. Your vision may seem fuzzy or out of focus. There are many causes of blurred vision. In many cases, blurred vision has to do  with an abnormal eye shape (refractive error), and it can be corrected with glasses or contact lenses. Pay attention to any changes in your symptoms. Contact a health care provider if your symptoms do not improve or if you have any new symptoms. This information is not intended to replace advice given to you by your health care provider. Make sure you discuss any questions you have with your health care provider. Document Revised: 03/22/2021 Document Reviewed: 03/22/2021 Elsevier Patient Education  2024 [employer] Inc. Dyslipidemia Dyslipidemia is an imbalance of waxy, fat-like substances (lipids) in the blood. The body needs lipids in small amounts. Dyslipidemia often involves a high level of cholesterol or triglycerides, which are types of lipids. Common forms of dyslipidemia include: High levels of LDL cholesterol. LDL is the type of cholesterol that causes fatty deposits (plaques) to build up in the blood vessels that carry blood away from the heart (arteries). Low levels of HDL cholesterol. HDL cholesterol is the type of cholesterol that protects against heart disease. High levels of HDL remove the LDL buildup from arteries. High levels of triglycerides. Triglycerides are a fatty substance in the blood that is linked to a buildup of plaques in the arteries. What are the causes? There are two main types of dyslipidemia: primary and secondary. Primary dyslipidemia is caused by changes (mutations) in genes that are passed down through families (inherited). These mutations cause several types of dyslipidemia. Secondary dyslipidemia may be caused by various risk factors that can lead to the disease, such as lifestyle choices and certain medical conditions. What increases the risk? You are more likely to develop this condition if you are an older man or if you are a woman who has gone through menopause. Other risk factors include: Having a family history of dyslipidemia. Taking certain medicines,  including birth control pills, steroids, some diuretics, and beta-blockers. Eating a  diet high in saturated fat. Smoking cigarettes or excessive alcohol intake. Having certain medical conditions such as diabetes, polycystic ovary syndrome (PCOS), kidney disease, liver disease, or hypothyroidism. Not exercising regularly. Being overweight or obese with too much belly fat. What are the signs or symptoms? In most cases, dyslipidemia does not usually cause any symptoms. In severe cases, very high lipid levels can cause: Fatty bumps under the skin (xanthomas). A white or gray ring around the black center (pupil) of the eye. Very high triglyceride levels can cause inflammation of the pancreas (pancreatitis). How is this diagnosed? Your health care provider may diagnose dyslipidemia based on a routine blood test (fasting blood test). Because most people do not have symptoms of the condition, this blood testing (lipid profile) is done on adults age 25 and older and is repeated every 4-6 years. This test checks: Total cholesterol. This measures the total amount of cholesterol in your blood, including LDL cholesterol, HDL cholesterol, and triglycerides. A healthy number is below 200 mg/dL (4.82 mmol/L). LDL cholesterol. The target number for LDL cholesterol is different for each person, depending on individual risk factors. A healthy number is usually below 100 mg/dL (7.40 mmol/L). Ask your health care provider what your LDL cholesterol should be. HDL cholesterol. An HDL level of 60 mg/dL (8.44 mmol/L) or higher is best because it helps to protect against heart disease. A number below 40 mg/dL (8.96 mmol/L) for men or below 50 mg/dL (8.70 mmol/L) for women increases the risk for heart disease. Triglycerides. A healthy triglyceride number is below 150 mg/dL (8.30 mmol/L). If your lipid profile is abnormal, your health care provider may do other blood tests. How is this treated? Treatment depends on the type  of dyslipidemia that you have and your other risk factors for heart disease and stroke. Your health care provider will have a target range for your lipid levels based on this information. Treatment for dyslipidemia starts with lifestyle changes, such as diet and exercise. Your health care provider may recommend that you: Get regular exercise. Make changes to your diet. Quit smoking if you smoke. Limit your alcohol intake. If diet changes and exercise do not help you reach your goals, your health care provider may also prescribe medicine to lower lipids. The most commonly prescribed type of medicine lowers your LDL cholesterol (statin drug). If you have a high triglyceride level, your provider may prescribe another type of drug (fibrate) or an omega-3 fish oil  supplement, or both. Follow these instructions at home: Eating and drinking  Follow instructions from your health care provider or dietitian about eating or drinking restrictions. Eat a healthy diet as told by your health care provider. This can help you reach and maintain a healthy weight, lower your LDL cholesterol, and raise your HDL cholesterol. This may include: Limiting your calories, if you are overweight. Eating more fruits, vegetables, whole grains, fish, and lean meats. Limiting saturated fat, trans fat, and cholesterol. Do not drink alcohol if: Your health care provider tells you not to drink. You are pregnant, may be pregnant, or are planning to become pregnant. If you drink alcohol: Limit how much you have to: 0-1 drink a day for women. 0-2 drinks a day for men. Know how much alcohol is in your drink. In the [place] one drink equals one 12 oz bottle of beer (355 mL), one 5 oz glass of wine (148 mL), or one 1 oz glass of hard liquor (44 mL). Activity Get regular exercise. Start an exercise and  strength training program as told by your health care provider. Ask your health care provider what activities are safe for you. Your  health care provider may recommend: 30 minutes of aerobic activity 4-6 days a week. Brisk walking is an example of aerobic activity. Strength training 2 days a week. General instructions Do not use any products that contain nicotine  or tobacco. These products include cigarettes, chewing tobacco, and vaping devices, such as e-cigarettes. If you need help quitting, ask your health care provider. Take over-the-counter and prescription medicines only as told by your health care provider. This includes supplements. Keep all follow-up visits. This is important. Contact a health care provider if: You are having trouble sticking to your exercise or diet plan. You are struggling to quit smoking or to control your use of alcohol. Summary Dyslipidemia often involves a high level of cholesterol or triglycerides, which are types of lipids. Treatment depends on the type of dyslipidemia that you have and your other risk factors for heart disease and stroke. Treatment for dyslipidemia starts with lifestyle changes, such as diet and exercise. Your health care provider may prescribe medicine to lower lipids. This information is not intended to replace advice given to you by your health care provider. Make sure you discuss any questions you have with your health care provider. Document Revised: 06/23/2022 Document Reviewed: 01/24/2021 Elsevier Patient Education  2025 [employer]. Hypertension, Adult Hypertension is another name for high blood pressure. High blood pressure forces your heart to work harder to pump blood. This can cause problems over time. There are two numbers in a blood pressure reading. There is a top number (systolic) over a bottom number (diastolic). It is best to have a blood pressure that is below 120/80. What are the causes? The cause of this condition is not known. Some other conditions can lead to high blood pressure. What increases the risk? Some lifestyle factors can make you more  likely to develop high blood pressure: Smoking. Not getting enough exercise or physical activity. Being overweight. Having too much fat, sugar, calories, or salt (sodium) in your diet. Drinking too much alcohol. Other risk factors include: Having any of these conditions: Heart disease. Diabetes. High cholesterol. Kidney disease. Obstructive sleep apnea. Having a family history of high blood pressure and high cholesterol. Age. The risk increases with age. Stress. What are the signs or symptoms? High blood pressure may not cause symptoms. Very high blood pressure (hypertensive crisis) may cause: Headache. Fast or uneven heartbeats (palpitations). Shortness of breath. Nosebleed. Vomiting or feeling like you may vomit (nauseous). Changes in how you see. Very bad chest pain. Feeling dizzy. Seizures. How is this treated? This condition is treated by making healthy lifestyle changes, such as: Eating healthy foods. Exercising more. Drinking less alcohol. Your doctor may prescribe medicine if lifestyle changes do not help enough and if: Your top number is above 130. Your bottom number is above 80. Your personal target blood pressure may vary. Follow these instructions at home: Eating and drinking  If told, follow the DASH eating plan. To follow this plan: Fill one half of your plate at each meal with fruits and vegetables. Fill one fourth of your plate at each meal with whole grains. Whole grains include whole-wheat pasta, brown rice, and whole-grain bread. Eat or drink low-fat dairy products, such as skim milk or low-fat yogurt. Fill one fourth of your plate at each meal with low-fat (lean) proteins. Low-fat proteins include fish, chicken without skin, eggs, beans, and tofu. Avoid  fatty meat, cured and processed meat, or chicken with skin. Avoid pre-made or processed food. Limit the amount of salt in your diet to less than 1,500 mg each day. Do not drink alcohol if: Your  doctor tells you not to drink. You are pregnant, may be pregnant, or are planning to become pregnant. If you drink alcohol: Limit how much you have to: 0-1 drink a day for women. 0-2 drinks a day for men. Know how much alcohol is in your drink. In the [place] one drink equals one 12 oz bottle of beer (355 mL), one 5 oz glass of wine (148 mL), or one 1 oz glass of hard liquor (44 mL). Lifestyle  Work with your doctor to stay at a healthy weight or to lose weight. Ask your doctor what the best weight is for you. Get at least 30 minutes of exercise that causes your heart to beat faster (aerobic exercise) most days of the week. This may include walking, swimming, or biking. Get at least 30 minutes of exercise that strengthens your muscles (resistance exercise) at least 3 days a week. This may include lifting weights or doing Pilates. Do not smoke or use any products that contain nicotine  or tobacco. If you need help quitting, ask your doctor. Check your blood pressure at home as told by your doctor. Keep all follow-up visits. Medicines Take over-the-counter and prescription medicines only as told by your doctor. Follow directions carefully. Do not skip doses of blood pressure medicine. The medicine does not work as well if you skip doses. Skipping doses also puts you at risk for problems. Ask your doctor about side effects or reactions to medicines that you should watch for. Contact a doctor if: You think you are having a reaction to the medicine you are taking. You have headaches that keep coming back. You feel dizzy. You have swelling in your ankles. You have trouble with your vision. Get help right away if: You get a very bad headache. You start to feel mixed up (confused). You feel weak or numb. You feel faint. You have very bad pain in your: Chest. Belly (abdomen). You vomit more than once. You have trouble breathing. These symptoms may be an emergency. Get help right away. Call  911. Do not wait to see if the symptoms will go away. Do not drive yourself to the hospital. Summary Hypertension is another name for high blood pressure. High blood pressure forces your heart to work harder to pump blood. For most people, a normal blood pressure is less than 120/80. Making healthy choices can help lower blood pressure. If your blood pressure does not get lower with healthy choices, you may need to take medicine. This information is not intended to replace advice given to you by your health care provider. Make sure you discuss any questions you have with your health care provider. Document Revised: 09/08/2021 Document Reviewed: 09/08/2021 Elsevier Patient Education  2024 [employer] Managing Your Hypertension Hypertension, also called high blood pressure, is when the force of the blood pressing against the walls of the arteries is too strong. Arteries are blood vessels that carry blood from your heart throughout your body. Hypertension forces the heart to work harder to pump blood and may cause the arteries to become narrow or stiff. Understanding blood pressure readings A blood pressure reading includes a higher number over a lower number: The first, or top, number is called the systolic pressure. It is a measure of the pressure in your arteries  as your heart beats. The second, or bottom number, is called the diastolic pressure. It is a measure of the pressure in your arteries as the heart relaxes. For most people, a normal blood pressure is below 120/80. Your personal target blood pressure may vary depending on your medical conditions, your age, and other factors. Blood pressure is classified into four stages. Based on your blood pressure reading, your health care provider may use the following stages to determine what type of treatment you need, if any. Systolic pressure and diastolic pressure are measured in a unit called millimeters of mercury (mmHg). Normal Systolic  pressure: below 120. Diastolic pressure: below 80. Elevated Systolic pressure: 120-129. Diastolic pressure: below 80. Hypertension stage 1 Systolic pressure: 130-139. Diastolic pressure: 80-89. Hypertension stage 2 Systolic pressure: 140 or above. Diastolic pressure: 90 or above. How can this condition affect me? Managing your hypertension is very important. Over time, hypertension can damage the arteries and decrease blood flow to parts of the body, including the brain, heart, and kidneys. Having untreated or uncontrolled hypertension can lead to: A heart attack. A stroke. A weakened blood vessel (aneurysm). Heart failure. Kidney damage. Eye damage. Memory and concentration problems. Vascular dementia. What actions can I take to manage this condition? Hypertension can be managed by making lifestyle changes and possibly by taking medicines. Your health care provider will help you make a plan to bring your blood pressure within a normal range. You may be referred for counseling on a healthy diet and physical activity. Nutrition  Eat a diet that is high in fiber and potassium, and low in salt (sodium), added sugar, and fat. An example eating plan is called the DASH diet. DASH stands for Dietary Approaches to Stop Hypertension. To eat this way: Eat plenty of fresh fruits and vegetables. Try to fill one-half of your plate at each meal with fruits and vegetables. Eat whole grains, such as whole-wheat pasta, brown rice, or whole-grain bread. Fill about one-fourth of your plate with whole grains. Eat low-fat dairy products. Avoid fatty cuts of meat, processed or cured meats, and poultry with skin. Fill about one-fourth of your plate with lean proteins such as fish, chicken without skin, beans, eggs, and tofu. Avoid pre-made and processed foods. These tend to be higher in sodium, added sugar, and fat. Reduce your daily sodium intake. Many people with hypertension should eat less than 1,500 mg  of sodium a day. Lifestyle  Work with your health care provider to maintain a healthy body weight or to lose weight. Ask what an ideal weight is for you. Get at least 30 minutes of exercise that causes your heart to beat faster (aerobic exercise) most days of the week. Activities may include walking, swimming, or biking. Include exercise to strengthen your muscles (resistance exercise), such as weight lifting, as part of your weekly exercise routine. Try to do these types of exercises for 30 minutes at least 3 days a week. Do not use any products that contain nicotine  or tobacco. These products include cigarettes, chewing tobacco, and vaping devices, such as e-cigarettes. If you need help quitting, ask your health care provider. Control any long-term (chronic) conditions you have, such as high cholesterol or diabetes. Identify your sources of stress and find ways to manage stress. This may include meditation, deep breathing, or making time for fun activities. Alcohol use Do not drink alcohol if: Your health care provider tells you not to drink. You are pregnant, may be pregnant, or are planning to become  pregnant. If you drink alcohol: Limit how much you have to: 0-1 drink a day for women. 0-2 drinks a day for men. Know how much alcohol is in your drink. In the [place] one drink equals one 12 oz bottle of beer (355 mL), one 5 oz glass of wine (148 mL), or one 1 oz glass of hard liquor (44 mL). Medicines Your health care provider may prescribe medicine if lifestyle changes are not enough to get your blood pressure under control and if: Your systolic blood pressure is 130 or higher. Your diastolic blood pressure is 80 or higher. Take medicines only as told by your health care provider. Follow the directions carefully. Blood pressure medicines must be taken as told by your health care provider. The medicine does not work as well when you skip doses. Skipping doses also puts you at risk for  problems. Monitoring Before you monitor your blood pressure: Do not smoke, drink caffeinated beverages, or exercise within 30 minutes before taking a measurement. Use the bathroom and empty your bladder (urinate). Sit quietly for at least 5 minutes before taking measurements. Monitor your blood pressure at home as told by your health care provider. To do this: Sit with your back straight and supported. Place your feet flat on the floor. Do not cross your legs. Support your arm on a flat surface, such as a table. Make sure your upper arm is at heart level. Each time you measure, take two or three readings one minute apart and record the results. You may also need to have your blood pressure checked regularly by your health care provider. General information Talk with your health care provider about your diet, exercise habits, and other lifestyle factors that may be contributing to hypertension. Review all the medicines you take with your health care provider because there may be side effects or interactions. Keep all follow-up visits. Your health care provider can help you create and adjust your plan for managing your high blood pressure. Where to find more information National Heart, Lung, and Blood Institute: popsteam.is American Heart Association: www.heart.org Contact a health care provider if: You think you are having a reaction to medicines you have taken. You have repeated (recurrent) headaches. You feel dizzy. You have swelling in your ankles. You have trouble with your vision. Get help right away if: You develop a severe headache or confusion. You have unusual weakness or numbness, or you feel faint. You have severe pain in your chest or abdomen. You vomit repeatedly. You have trouble breathing. These symptoms may be an emergency. Get help right away. Call 911. Do not wait to see if the symptoms will go away. Do not drive yourself to the hospital. Summary Hypertension  is when the force of blood pumping through your arteries is too strong. If this condition is not controlled, it may put you at risk for serious complications. Your personal target blood pressure may vary depending on your medical conditions, your age, and other factors. For most people, a normal blood pressure is less than 120/80. Hypertension is managed by lifestyle changes, medicines, or both. Lifestyle changes to help manage hypertension include losing weight, eating a healthy, low-sodium diet, exercising more, stopping smoking, and limiting alcohol. This information is not intended to replace advice given to you by your health care provider. Make sure you discuss any questions you have with your health care provider. Document Revised: 08/04/2021 Document Reviewed: 08/04/2021 Elsevier Patient Education  2024 [employer] Transient Ischemic Attack A transient ischemic  attack (TIA) causes the same symptoms as a stroke, but the symptoms go away quickly. A TIA happens when blood flow to the brain is blocked. Having a TIA means you may be at risk for a stroke. A TIA is a medical emergency. What are the causes? A TIA is caused by a blocked artery in the head or neck. This means the brain does not get the blood supply it needs. A blockage can be caused by: Fatty buildup in an artery in the head or neck. A blood clot. A tear in an artery. Irritation and swelling (inflammation) of an artery. Sometimes the cause is not known. What increases the risk? Certain things may make you more likely to have a TIA. Some of these are things that you can change, such as: Using products that have nicotine  or tobacco. Not being active. Drinking too much alcohol. Using recreational drugs. Health conditions that may increase your risk include: High blood pressure. High cholesterol. Diabetes. Heart disease. A heartbeat that is not regular (atrial fibrillation). Sickle cell disease. Problems with blood  clotting. Other risk factors include: Being over the age of 29. Being female. Being very overweight. Sleep problems (sleep apnea). Having a family history of stroke. Having had blood clots, stroke, TIA, or heart attack in the past. What are the signs or symptoms? The symptoms of a TIA are like those of a stroke. They can include: Weakness or loss of feeling in your face, arm, or leg. This often happens on one side of your body. Trouble walking. Trouble moving your arms or legs. Trouble talking or understanding what people are saying. Problems with how you see. Feeling dizzy. Feeling confused. Loss of balance or coordination. Feeling like you may vomit (nausea) or vomiting. Having a very bad headache. If you can, note what time you started to have symptoms. Tell your doctor. How is this treated? The goal of treatment is to lower the risk for a stroke. This may include: Changes to diet and lifestyle, such as getting regular exercise and stopping smoking. Taking medicines to: Thin the blood. Lower blood pressure. Lower cholesterol. Treating other health conditions, such as diabetes. If testing shows that an artery in your brain is narrow, your doctor may recommend a procedure to: Take the blockage out of your artery. Open or widen an artery in your neck (carotid angioplasty and stenting). Follow these instructions at home: Medicines Take over-the-counter and prescription medicines only as told by your doctor. If you were told to take aspirin  or another medicine to thin your blood, use it exactly as told by your doctor. Taking too much of the medicine can cause bleeding. Taking too little of the medicine may not work to treat the problem. Eating and drinking  Eat 5 or more servings of fruits and vegetables each day. Follow instructions from your doctor about your diet. You may need to follow a certain diet to help lower your risk of a stroke. You may need to: Eat a diet that is  low in fat and salt. Eat foods with a lot of fiber. Limit carbohydrates and sugar. If you drink alcohol: Limit how much you have to: 0-1 drink a day for women who are not pregnant. 0-2 drinks a day for men. Know how much alcohol is in a drink. In the [place] one drink equals one 12 oz bottle of beer (355 mL), one 5 oz glass of wine (148 mL), or one 1 oz glass of hard liquor (44 mL). General instructions  Keep a healthy weight. Try to get at least 30 minutes of exercise on most days. Get treatment if you have sleep problems. Do not smoke or use any products that contain nicotine  or tobacco. If you need help quitting, ask your doctor. Do not use drugs. Keep all follow-up visits. Your doctor will want to know if you have any more symptoms and to check blood labs if any medicines were prescribed. Where to find more information American Stroke Association: stroke.org Get help right away if: You have chest pain. You have a heartbeat that is not regular. You have any signs of a stroke. BE FAST is an easy way to remember the main warning signs: B - Balance. Dizziness, sudden trouble walking, or loss of balance. E - Eyes. Trouble seeing or a change in how you see. F - Face. Sudden weakness or loss of feeling of the face. The face or eyelid may droop on one side. A - Arms. Weakness or loss of feeling in an arm. This happens all of a sudden and most often on one side of the body. S - Speech. Sudden trouble speaking, slurred speech, or trouble understanding what people say. T - Time. Time to call emergency services. Write down what time symptoms started. You have other signs of a stroke, such as: A sudden, very bad headache with no known cause. Feeling like you may vomit. Vomiting. A seizure. These symptoms may be an emergency. Get help right away. Call 911. Do not wait to see if the symptoms will go away. Do not drive yourself to the hospital. This information is not intended to replace  advice given to you by your health care provider. Make sure you discuss any questions you have with your health care provider. Document Revised: 05/05/2022 Document Reviewed: 05/05/2022 Elsevier Patient Education  2024 [employer] Smokeless Tobacco Information, Adult  Tobacco is a leafy plant that contains a chemical called nicotine . Tobacco use is harmful to your health. Nicotine  affects the chemicals in your brain, and this can cause you to crave nicotine . These cravings can lead to addiction. Smokeless tobacco is tobacco that you put in your mouth instead of smoking it. It may also be called chewing tobacco or snuff. Smokeless tobacco is made from the leaves of tobacco plants and comes in many forms, such as: Loose, dry leaves. Plugs or twists. Moist pouches. Dissolving lozenges or strips. Chewing, sucking, or holding the tobacco in your mouth causes your mouth to make more saliva. The saliva mixes with the tobacco to make tobacco juice that is swallowed or spit out. How can smokeless tobacco use affect me? All forms of tobacco contain many chemicals that can harm every organ in the body. Using smokeless tobacco increases your risk for: Cancer. Tobacco use is one of the leading causes of cancer. Smokeless tobacco is linked to cancer of the mouth, esophagus, and pancreas. Other long-term health problems, including high blood pressure, heart disease, and stroke. Addiction. Pregnancy complications. Pregnant women who use smokeless tobacco are more likely to have a miscarriage or deliver a baby too early (premature delivery). Mouth and dental problems, such as: Bad breath. Teeth that look yellow or brown. Mouth sores. Cracking and bleeding lips. Gum recession, gum disease, or tooth decay. Lesions on the soft tissues of your mouth (leukoplakia). The benefits of not using smokeless tobacco include: A healthy mind and body. Saving money. You avoid the cost of buying tobacco and the cost  of treating illnesses that are caused by  tobacco. What actions can I take to quit using tobacco? Ask your health care provider for help to quit using smokeless tobacco. This may involve treatment. These tips may also help you quit: Pick a date to quit. Set a date within the next 2 weeks. This gives you time to prepare. Write down the reasons why you are quitting. Keep this list in places where you will see it often, such as on your bathroom mirror or in your car or wallet. Identify the people, places, things, and activities that make you want to use smokeless tobacco (triggers). Avoid them. Tell your family and friends that you are quitting. Look for a support group in your area. Having support can make quitting easier. Get rid of any tobacco you have and remove any tobacco smells. To do this: Throw away all containers of tobacco at home, at work, and in your car. Throw away any other items that you use regularly when you chew tobacco. Clean your car and make sure to remove all tobacco-related items. Clean your home, including curtains and carpets. Keep track of how many days have passed since you quit. It may help to cross days off a calendar. Remembering how long and hard you have worked to quit can help you avoid using smokeless tobacco again. Stay positive. Be prepared for cravings. It is common to slip up when you first quit, so take it one day at a time. Stay busy and take care of your body. Get plenty of exercise. Drink enough water to keep your urine pale yellow. Where to find support Ask your health care provider if there is a local support group for quitting smokeless tobacco and any available online resources, such as: Smoke Free: www.veterans.smokefree.gov Be Tobacco Free: eopquic.com Tobacco Quitline: 1-855-QUIT-VET 339-427-6535). Hotlines, such as 1-800-QUIT-NOW 817-865-4870). Where to find more information You can learn more about the risks of using smokeless  tobacco and the benefits of quitting from these sources: American Cancer Society: www.cancer.org National Cancer Institute: www.cancer.gov Centers for Disease Control and Prevention: footballexhibition.com.br Food and Drug Administration: https://mcknight.com/ Contact a health care provider if you have: Trouble quitting smokeless tobacco use on your own. White or other discolored patches in your mouth. Difficulty swallowing. A change in your voice. Unexplained weight loss. Stomach pain, nausea, or vomiting. Summary Smokeless tobacco contains many different chemicals that are known to cause cancer. Nicotine  is an addictive chemical in smokeless tobacco that affects your brain. The benefits of not using smokeless tobacco include having a healthy mind and body and saving money. Tell your family and friends that you are quitting. Having support can make quitting easier. Ask your health care provider for help quitting smokeless tobacco. This may involve treatment. This information is not intended to replace advice given to you by your health care provider. Make sure you discuss any questions you have with your health care provider. Document Revised: 08/17/2021 Document Reviewed: 08/17/2021 Elsevier Patient Education  2024 [employer]  The above assessment and management plan was discussed with the patient. The patient verbalized understanding of and has agreed to the management plan. Patient is aware to call the clinic if they develop any new symptoms or if symptoms persist or worsen. Patient is aware when to return to the clinic for a follow-up visit. Patient educated on when it is appropriate to go to the emergency department.   Kaylamarie Swickard [place] Thompson, DNP [facility] [address] [place], [place] [address] [phone]

## 2024-09-29 ENCOUNTER — Ambulatory Visit (INDEPENDENT_AMBULATORY_CARE_PROVIDER_SITE_OTHER): Admitting: Nurse Practitioner

## 2024-09-29 ENCOUNTER — Encounter: Payer: Self-pay | Admitting: Nurse Practitioner

## 2024-09-29 VITALS — BP 150/80 | HR 83 | Temp 97.3°F | Ht 62.0 in | Wt 115.4 lb

## 2024-09-29 DIAGNOSIS — E1152 Type 2 diabetes mellitus with diabetic peripheral angiopathy with gangrene: Secondary | ICD-10-CM

## 2024-09-29 DIAGNOSIS — G459 Transient cerebral ischemic attack, unspecified: Secondary | ICD-10-CM

## 2024-09-29 DIAGNOSIS — E1169 Type 2 diabetes mellitus with other specified complication: Secondary | ICD-10-CM | POA: Diagnosis not present

## 2024-09-29 DIAGNOSIS — H538 Other visual disturbances: Secondary | ICD-10-CM | POA: Diagnosis not present

## 2024-09-29 DIAGNOSIS — E785 Hyperlipidemia, unspecified: Secondary | ICD-10-CM

## 2024-09-29 DIAGNOSIS — Z1211 Encounter for screening for malignant neoplasm of colon: Secondary | ICD-10-CM

## 2024-09-29 DIAGNOSIS — I152 Hypertension secondary to endocrine disorders: Secondary | ICD-10-CM

## 2024-09-29 DIAGNOSIS — D649 Anemia, unspecified: Secondary | ICD-10-CM

## 2024-09-29 DIAGNOSIS — Z720 Tobacco use: Secondary | ICD-10-CM

## 2024-09-29 DIAGNOSIS — E1159 Type 2 diabetes mellitus with other circulatory complications: Secondary | ICD-10-CM | POA: Diagnosis not present

## 2024-09-29 DIAGNOSIS — Z1231 Encounter for screening mammogram for malignant neoplasm of breast: Secondary | ICD-10-CM

## 2024-09-29 DIAGNOSIS — E1165 Type 2 diabetes mellitus with hyperglycemia: Secondary | ICD-10-CM | POA: Insufficient documentation

## 2024-09-29 DIAGNOSIS — Z794 Long term (current) use of insulin: Secondary | ICD-10-CM

## 2024-09-29 DIAGNOSIS — E040 Nontoxic diffuse goiter: Secondary | ICD-10-CM | POA: Insufficient documentation

## 2024-09-29 LAB — BAYER DCA HB A1C WAIVED: HB A1C (BAYER DCA - WAIVED): 8.7 % — ABNORMAL HIGH (ref 4.8–5.6)

## 2024-09-29 MED ORDER — HYDROCHLOROTHIAZIDE 50 MG PO TABS: 50.0000 mg | ORAL_TABLET | Freq: Every day | ORAL | 0 refills | Status: DC

## 2024-09-29 MED ORDER — AMLODIPINE BESYLATE-VALSARTAN 10-320 MG PO TABS: 1.0000 | ORAL_TABLET | Freq: Every day | ORAL | 0 refills | Status: DC

## 2024-09-30 ENCOUNTER — Ambulatory Visit: Payer: Self-pay | Admitting: Nurse Practitioner

## 2024-09-30 LAB — MICROALBUMIN / CREATININE URINE RATIO
Creatinine, Urine: 116.3 mg/dL
Microalb/Creat Ratio: 15 mg/g{creat} (ref 0–29)
Microalbumin, Urine: 17.9 ug/mL

## 2024-10-02 ENCOUNTER — Encounter: Payer: Self-pay | Admitting: Nurse Practitioner

## 2024-10-02 ENCOUNTER — Telehealth: Payer: Self-pay

## 2024-10-02 ENCOUNTER — Ambulatory Visit: Payer: Self-pay | Admitting: Nurse Practitioner

## 2024-10-02 ENCOUNTER — Ambulatory Visit (INDEPENDENT_AMBULATORY_CARE_PROVIDER_SITE_OTHER): Admitting: Nurse Practitioner

## 2024-10-02 ENCOUNTER — Ambulatory Visit: Payer: Self-pay

## 2024-10-02 VITALS — BP 149/90 | HR 92 | Temp 97.9°F | Ht 62.0 in | Wt 109.6 lb

## 2024-10-02 DIAGNOSIS — R112 Nausea with vomiting, unspecified: Secondary | ICD-10-CM | POA: Insufficient documentation

## 2024-10-02 DIAGNOSIS — Z716 Tobacco abuse counseling: Secondary | ICD-10-CM | POA: Insufficient documentation

## 2024-10-02 DIAGNOSIS — R079 Chest pain, unspecified: Secondary | ICD-10-CM | POA: Insufficient documentation

## 2024-10-02 DIAGNOSIS — K219 Gastro-esophageal reflux disease without esophagitis: Secondary | ICD-10-CM | POA: Insufficient documentation

## 2024-10-02 DIAGNOSIS — R1013 Epigastric pain: Secondary | ICD-10-CM | POA: Insufficient documentation

## 2024-10-02 DIAGNOSIS — E1159 Type 2 diabetes mellitus with other circulatory complications: Secondary | ICD-10-CM

## 2024-10-02 DIAGNOSIS — I10 Essential (primary) hypertension: Secondary | ICD-10-CM

## 2024-10-02 MED ORDER — DICYCLOMINE HCL 10 MG PO CAPS: 10.0000 mg | ORAL_CAPSULE | Freq: Three times a day (TID) | ORAL | 0 refills | Status: DC

## 2024-10-02 MED ORDER — ONDANSETRON HCL 4 MG PO TABS: 4.0000 mg | ORAL_TABLET | Freq: Three times a day (TID) | ORAL | 0 refills | Status: DC | PRN

## 2024-10-02 MED ORDER — OMEPRAZOLE MAGNESIUM 20 MG PO TBEC: 20.0000 mg | DELAYED_RELEASE_TABLET | Freq: Every day | ORAL | 0 refills | Status: DC

## 2024-10-02 MED ORDER — AMLODIPINE BESYLATE-VALSARTAN 10-320 MG PO TABS: 1.0000 | ORAL_TABLET | Freq: Every day | ORAL | 0 refills | Status: AC

## 2024-10-02 MED ORDER — OMEPRAZOLE MAGNESIUM 20 MG PO TBEC: 20.0000 mg | DELAYED_RELEASE_TABLET | Freq: Every day | ORAL | 0 refills | Status: AC

## 2024-10-02 NOTE — Telephone Encounter (Signed)
 Copied from CRM #[id number]. Topic: Clinical - Prescription Issue >> Oct 02, 2024  9:57 AM Ahlexyia S wrote: Reason for CRM: Pt called in stating that medications amLODipine -valsartan (EXFORGE) 10-320 MG tablet, dicyclomine (BENTYL) 10 MG capsule, omeprazole (PRILOSEC OTC) 20 MG tablet and ondansetron  (ZOFRAN ) 4 MG tablet were sent in to the wrong pharmacy. Pt stated these medications are needing to be sent to [facility] not [facility].

## 2024-10-02 NOTE — Progress Notes (Signed)
 Subjective:  Patient ID: Tammy Bauer, female    DOB: 1966/05/23, 58 y.o.   MRN: [id number]  Patient Care Team: Deitra Morton Sebastian Nena, NP as PCP - General (Nurse Practitioner)   Chief Complaint:  Chest Pain (Symptoms started yesterday ), Heartburn, and Nausea   HPI: Tammy Bauer is a 58 y.o. female presenting on 10/02/2024 for Chest Pain (Symptoms started yesterday ), Heartburn, and Nausea   Discussed the use of AI scribe software for clinical note transcription with the patient, who gave verbal consent to proceed.  History of Present Illness Tammy Bauer is a 58 year old female with hypertension who presents with chest pain.  She has been experiencing sharp chest pain since yesterday, located under her ribs without radiation, rated as an eight out of ten. The pain began while she was preparing for a funeral and has not improved since onset. She denies any sensation of pressure on her chest but reports nausea and vomiting, describing the vomitus as 'like white' and tasting 'like a chemical'. She attempted to eat green jello, which she later vomited.  She has a history of an ulcer but has not experienced acid reflux recently. She sometimes wakes up with a sour taste in her mouth. Since the pain began, she has not been eating but typically consumes three meals a day.  She has a history of hypertension and is currently taking hydrochlorothiazide  50 mg daily, although she has not taken her blood pressure medication today. She was prescribed Exforge for better blood pressure control but has not yet picked it up from the pharmacy. She acknowledges eating some salt and does not currently have a blood pressure machine at home. She has a history of a transient ischemic attack (TIA).  She continues to smoke.      Relevant past medical, surgical, family, and social history reviewed and updated as indicated.  Allergies and medications reviewed and updated. Data reviewed: Chart in Epic.   Past  Medical History:  Diagnosis Date   Anemia    Diabetes mellitus without complication (HCC)    Hypertension    Stroke [facility])    x2; no deficits   Thyroid  nodule 10/02/2017    Past Surgical History:  Procedure Laterality Date   ENDOMETRIAL ABLATION N/A 01/30/2018   Procedure: ENDOMETRIAL ABLATION WITH MINERVA;  Surgeon: Jayne Vonn DEL, MD;  Location: [facility];  Service: Gynecology;  Laterality: N/A;   HYSTEROSCOPY WITH D & C N/A 01/30/2018   Procedure: DILATATION AND CURETTAGE /HYSTEROSCOPY;  Surgeon: Jayne Vonn DEL, MD;  Location: [facility];  Service: Gynecology;  Laterality: N/A;   LOOP RECORDER INSERTION N/A 06/27/2017   Procedure: Loop Recorder Insertion;  Surgeon: Inocencio Soyla Lunger, MD;  Location: [facility];  Service: Cardiovascular;  Laterality: N/A;   NERVE, TENDON AND ARTERY REPAIR Left 10/21/2019   Procedure: WOUND EXPLORATION LEFT HAND WITH REPAIR TENDON, ARTERY AND NERVE ;  Surgeon: Murrell Drivers, MD;  Location: [facility];  Service: Orthopedics;  Laterality: Left;   TEE WITHOUT CARDIOVERSION N/A 06/27/2017   Procedure: TRANSESOPHAGEAL ECHOCARDIOGRAM (TEE);  Surgeon: Mona Vinie BROCKS, MD;  Location[facility];  Service: Cardiovascular;  Laterality: N/A;    Social History   Socioeconomic History   Marital status: Married    Spouse name: Not on file   Number of children: Not on file   Years of education: Not on file   Highest education level: Not on file  Occupational History   Not on  file  Tobacco Use   Smoking status: Every Day    Current packs/day: 0.25    Average packs/day: 0.5 packs/day for 25.1 years (12.5 ttl pk-yrs)    Types: Cigarettes    Start date: 08/29/1999   Smokeless tobacco: Never   Tobacco comments:    smoke 5 cigarettes a day  Vaping Use   Vaping status: Never Used  Substance and Sexual Activity   Alcohol use: No   Drug use: No   Sexual activity: Not Currently    Birth control/protection: None  Other Topics Concern   Not on  file  Social History Narrative   Not on file   Social Drivers of Health   Financial Resource Strain: Not on file  Food Insecurity: No Food Insecurity (06/24/2024)   Hunger Vital Sign    Worried About Running Out of Food in the Last Year: Never true    Ran Out of Food in the Last Year: Never true  Transportation Needs: No Transportation Needs (06/24/2024)   PRAPARE - Administrator, Civil Service (Medical): No    Lack of Transportation (Non-Medical): No  Physical Activity: Not on file  Stress: Not on file  Social Connections: Not on file  Intimate Partner Violence: Not At Risk (06/24/2024)   Humiliation, Afraid, Rape, and Kick questionnaire    Fear of Current or Ex-Partner: No    Emotionally Abused: No    Physically Abused: No    Sexually Abused: No    Outpatient Encounter Medications as of 10/02/2024  Medication Sig   amLODipine -valsartan (EXFORGE) 10-320 MG tablet Take 1 tablet by mouth daily.   atorvastatin  (LIPITOR) 80 MG tablet Take 1 tablet (80 mg total) by mouth at bedtime.   clopidogrel  (PLAVIX ) 75 MG tablet Take 1 tablet (75 mg total) by mouth daily.   dicyclomine (BENTYL) 10 MG capsule Take 1 capsule (10 mg total) by mouth 4 (four) times daily -  before meals and at bedtime.   fish oil -omega-3 fatty acids  1000 MG capsule Take 2 capsules (2 g total) by mouth daily.   glipiZIDE  (GLUCOTROL ) 10 MG tablet TAKE 1 TABLET (10 MG TOTAL) BY MOUTH 2 (TWO) TIMES DAILY BEFORE A MEAL.   hydrochlorothiazide  (HYDRODIURIL ) 50 MG tablet Take 1 tablet (50 mg total) by mouth daily.   insulin  glargine-yfgn (SEMGLEE , YFGN,) 100 UNIT/ML injection Inject 0.3 mLs (30 Units total) into the skin 2 (two) times daily.   INSULIN  SYRINGE 1CC/29G (B-D INSULIN  SYRINGE) 29G X 1/2 1 ML MISC 1 application by Does not apply route at bedtime.   omeprazole (PRILOSEC OTC) 20 MG tablet Take 1 tablet (20 mg total) by mouth daily.   ondansetron  (ZOFRAN ) 4 MG tablet Take 1 tablet (4 mg total) by mouth  every 8 (eight) hours as needed for nausea or vomiting.   No facility-administered encounter medications on file as of 10/02/2024.    Allergies  Allergen Reactions   Ace Inhibitors Cough    Review of Systems  Constitutional:  Negative for chills and fever.  HENT:  Negative for congestion and sore throat.   Respiratory:  Negative for cough, shortness of breath and wheezing.   Cardiovascular:  Positive for chest pain. Negative for palpitations and leg swelling.       7/10 pain localized pain describe as sharp  Gastrointestinal:  Positive for nausea and vomiting.       Hx of gastric ulcer  Skin:  Negative for itching and rash.  Neurological:  Negative for dizziness and  headaches.         Objective:  BP (!) 149/90   Pulse 92   Temp 97.9 F (36.6 C) (Temporal)   Ht 5' 2 (1.575 m)   Wt 109 lb 9.6 oz (49.7 kg)   SpO2 98%   BMI 20.05 kg/m    Wt Readings from Last 3 Encounters:  10/02/24 109 lb 9.6 oz (49.7 kg)  09/29/24 115 lb 6.4 oz (52.3 kg)  07/23/24 110 lb 12.8 oz (50.3 kg)    Physical Exam Vitals and nursing note reviewed.  Constitutional:      General: She is not in acute distress.    Appearance: Normal appearance.  HENT:     Head: Normocephalic and atraumatic.     Right Ear: Tympanic membrane, ear canal and external ear normal. There is no impacted cerumen.     Left Ear: Tympanic membrane, ear canal and external ear normal. There is no impacted cerumen.     Nose: Nose normal.     Mouth/Throat:     Mouth: Mucous membranes are moist.  Eyes:     General: No scleral icterus.    Extraocular Movements: Extraocular movements intact.     Conjunctiva/sclera: Conjunctivae normal.     Pupils: Pupils are equal, round, and reactive to light.  Cardiovascular:     Rate and Rhythm: Normal rate and regular rhythm.  Pulmonary:     Effort: Pulmonary effort is normal.     Breath sounds: Normal breath sounds.  Abdominal:     General: Bowel sounds are normal.      Palpations: Abdomen is soft.     Tenderness: There is no abdominal tenderness. There is no guarding.  Musculoskeletal:        General: Normal range of motion.     Right lower leg: No edema.     Left lower leg: No edema.  Skin:    General: Skin is warm and dry.     Findings: No rash.  Neurological:     Mental Status: She is alert and oriented to person, place, and time.  Psychiatric:        Mood and Affect: Mood normal.        Behavior: Behavior normal.        Thought Content: Thought content normal.        Judgment: Judgment normal.    Physical Exam VITALS: BP- 151/91   EKG from today compared to last EKG 7/21/205, no changes PR: 148 msec QT: 386 msec QTcH 426 msec msec QRSD: 98 msec P-QRS-T: 67/61/90 degree Interpretation: Sinus Rhythm   Results for orders placed or performed in visit on 09/29/24  Bayer DCA Hb A1c Waived   Collection Time: 09/29/24  8:17 AM  Result Value Ref Range   HB A1C (BAYER DCA - WAIVED) 8.7 (H) 4.8 - 5.6 %  Microalbumin / creatinine urine ratio   Collection Time: 09/29/24 10:07 AM  Result Value Ref Range   Creatinine, Urine 116.3 Not Estab. mg/dL   Microalbumin, Urine 82.0 Not Estab. ug/mL   Microalb/Creat Ratio 15 0 - 29 mg/g creat       Pertinent labs & imaging results that were available during my care of the patient were reviewed by me and considered in my medical decision making.  Assessment & Plan:  Chrisandra was seen today for chest pain, heartburn and nausea.  Diagnoses and all orders for this visit:  Chest pain, unspecified type -     EKG 12-Lead  Gastroesophageal reflux  disease without esophagitis  Nausea and vomiting, unspecified vomiting type  Essential hypertension  Encounter for smoking cessation counseling  Other orders -     omeprazole (PRILOSEC OTC) 20 MG tablet; Take 1 tablet (20 mg total) by mouth daily. -     dicyclomine (BENTYL) 10 MG capsule; Take 1 capsule (10 mg total) by mouth 4 (four) times daily -  before  meals and at bedtime. -     ondansetron  (ZOFRAN ) 4 MG tablet; Take 1 tablet (4 mg total) by mouth every 8 (eight) hours as needed for nausea or vomiting.     Assessment and Plan Tammy Bauer is a 58 year old African-American female seen today for GERD, no acute distress Assessment & Plan Chest pain and epigastric pain with nausea and vomiting, likely secondary to gastroesophageal reflux disease (GERD) Acute chest and epigastric pain with nausea and vomiting, likely GERD. EKG unchanged, cardiac issues ruled out. GERD suspected due to symptoms and ulcer history. - Prescribed omeprazole 20 mg daily. - Prescribed Bentyl three times a day before meals. - Prescribed Zofran  for nausea and vomiting as needed. - Advised ED visit if no improvement by tomorrow.  Essential hypertension, uncontrolled Uncontrolled hypertension with BP 151/90 mmHg. History of TIA increases stroke risk. Non-compliance with Exforge noted. - Instructed to start Exforge as prescribed. - Continue hydrochlorothiazide  50 mg daily. - Advised to stop salt intake. - Instructed to obtain home BP monitor.  Tobacco use Continued tobacco use noted. Smoking cessation important for health and BP control.      Continue all other maintenance medications.  Follow up plan: Return for As already scheduled.   Continue healthy lifestyle choices, including diet (rich in fruits, vegetables, and lean proteins, and low in salt and simple carbohydrates) and exercise (at least 30 minutes of moderate physical activity daily).  Educational handout given for    Clinical References  Chest Wall Pain Chest wall pain is pain in or around the bones and muscles of your chest. Sometimes, an injury causes this pain. Excessive coughing or overuse of arm and chest muscles may also cause chest wall pain. Sometimes, the cause may not be known. This pain may take several weeks or longer to get better. Follow these instructions at home: Managing pain,  stiffness, and swelling  If directed, put ice on the painful area: Put ice in a plastic bag. Place a towel between your skin and the bag. Leave the ice on for 20 minutes, 2-3 times per day. Activity Rest as told by your health care provider. Avoid activities that cause pain. These include any activities that use your chest muscles or your abdominal and side muscles to lift heavy items. Ask your health care provider what activities are safe for you. General instructions  Take over-the-counter and prescription medicines only as told by your health care provider. Do not use any products that contain nicotine  or tobacco, such as cigarettes, e-cigarettes, and chewing tobacco. These can delay healing after injury. If you need help quitting, ask your health care provider. Keep all follow-up visits as told by your health care provider. This is important. Contact a health care provider if: You have a fever. Your chest pain becomes worse. You have new symptoms. Get help right away if: You have nausea or vomiting. You feel sweaty or light-headed. You have a cough with mucus from your lungs (sputum) or you cough up blood. You develop shortness of breath. These symptoms may represent a serious problem that is an emergency. Do not wait to  see if the symptoms will go away. Get medical help right away. Call your local emergency services (911 in the [place] Do not drive yourself to the hospital. Summary Chest wall pain is pain in or around the bones and muscles of your chest. Depending on the cause, it may be treated with ice, rest, medicines, and avoiding activities that cause pain. Contact a health care provider if you have a fever, worsening chest pain, or new symptoms. Get help right away if you feel light-headed or you develop shortness of breath. These symptoms may be an emergency. This information is not intended to replace advice given to you by your health care provider. Make sure you discuss any  questions you have with your health care provider. Document Revised: 11/13/2022 Document Reviewed: 11/13/2022 Elsevier Patient Education  2024 Elsevier Inc. Nonspecific Chest Pain Chest pain can be caused by many different conditions. Some causes of chest pain can be life-threatening. These will require treatment right away. Serious causes of chest pain include: Heart attack. A tear in the body's main blood vessel. Redness and swelling (inflammation) around your heart. Blood clot in your lungs. Other causes of chest pain may not be so serious. These include: Heartburn. Anxiety or stress. Damage to bones or muscles in your chest. Lung infections. Chest pain can feel like: Pain or discomfort in your chest. Crushing, pressure, aching, or squeezing pain. Burning or tingling. Dull or sharp pain that is worse when you move, cough, or take a deep breath. Pain or discomfort that is also felt in your back, neck, jaw, shoulder, or arm, or pain that spreads to any of these areas. It is hard to know whether your pain is caused by something that is serious or something that is not so serious. So it is important to see your doctor right away if you have chest pain. Follow these instructions at home: Medicines Take over-the-counter and prescription medicines only as told by your doctor. If you were prescribed an antibiotic medicine, take it as told by your doctor. Do not stop taking the antibiotic even if you start to feel better. Lifestyle  Rest as told by your doctor. Do not use any products that contain nicotine  or tobacco, such as cigarettes, e-cigarettes, and chewing tobacco. If you need help quitting, ask your doctor. Do not drink alcohol. Make lifestyle changes as told by your doctor. These may include: Getting regular exercise. Ask your doctor what activities are safe for you. Eating a heart-healthy diet. A diet and nutrition specialist (dietitian) can help you to learn healthy eating  options. Staying at a healthy weight. Treating diabetes or high blood pressure, if needed. Lowering your stress. Activities such as yoga and relaxation techniques can help. General instructions Pay attention to any changes in your symptoms. Tell your doctor about them or any new symptoms. Avoid any activities that cause chest pain. Keep all follow-up visits as told by your doctor. This is important. You may need more testing if your chest pain does not go away. Contact a doctor if: Your chest pain does not go away. You feel depressed. You have a fever. Get help right away if: Your chest pain is worse. You have a cough that gets worse, or you cough up blood. You have very bad (severe) pain in your belly (abdomen). You pass out (faint). You have either of these for no clear reason: Sudden chest discomfort. Sudden discomfort in your arms, back, neck, or jaw. You have shortness of breath at any time.  You suddenly start to sweat, or your skin gets clammy. You feel sick to your stomach (nauseous). You throw up (vomit). You suddenly feel lightheaded or dizzy. You feel very weak or tired. Your heart starts to beat fast, or it feels like it is skipping beats. These symptoms may be an emergency. Do not wait to see if the symptoms will go away. Get medical help right away. Call your local emergency services (911 in the [place] Do not drive yourself to the hospital. Summary Chest pain can be caused by many different conditions. The cause may be serious and need treatment right away. If you have chest pain, see your doctor right away. Follow your doctor's instructions for taking medicines and making lifestyle changes. Keep all follow-up visits as told by your doctor. This includes visits for any further testing if your chest pain does not go away. Be sure to know the signs that show that your condition has become worse. Get help right away if you have these symptoms. This information is not  intended to replace advice given to you by your health care provider. Make sure you discuss any questions you have with your health care provider. Document Revised: 10/05/2022 Document Reviewed: 10/05/2022 Elsevier Patient Education  2024 Elsevier Inc. GERD in Adults: Diet Changes When you have gastroesophageal reflux disease (GERD), you may need to make changes to your diet. Choosing the right foods can help with your symptoms. Think about working with an expert in healthy eating called a dietitian. They can help you make healthy food choices. What are tips for following this plan? Reading food labels Look for foods that are low in saturated fat. Foods that may help with your symptoms include: Foods with less than 5% of daily value (DV) of fat. Foods with 0 grams of trans fat. Cooking [employer] in ways that don't use a lot of fat. These ways include: Baking. Steaming. Grilling. Broiling. To add flavor, try to use herbs that are low in spice and acidity. Avoid frying your food. Meal planning  Eat small meals often rather than eating 3 large meals each day. Eat your meals slowly in a place where you feel relaxed. If told by your health care provider, avoid: Foods that cause symptoms. Keep a food diary to keep track of foods that cause symptoms. Alcohol. Drinking a lot of liquid with meals. General instructions For 2-3 hours after you eat, avoid: Bending over. Exercise. Lying down. Chew sugar-free gum after meals. What foods should I eat? Eat a healthy diet. Try to include: Foods with high amounts of fiber. These include: Fruits and vegetables. Whole grains and beans. Low-fat dairy products. Lean meats, fish, and poultry. Egg whites. Foods that cause symptoms in someone else may not cause symptoms for you. Work with your provider to find foods that are safe for you. The items listed above may not be all the foods and drinks you can have. Talk with a dietitian to learn  more. The items listed above may not be a complete list of foods and beverages you can eat and drink. Contact a dietitian for more information. What foods should I avoid? Limiting some of these foods may help with your symptoms. Each person is different. Talk with a dietitian or your provider to help you find the exact foods to avoid. Some of the foods to avoid may include: Fruits Fruits with a lot of acid in them. These may include citrus fruits, such as oranges, grapefruit, pineapple, and lemons.  Vegetables Deep-fried vegetables, such as French fries. Vegetables, sauces, or toppings made with added fat and vegetables with acid in them. These may include tomatoes and tomato products, chili peppers, onions, garlic, and horseradish. Grains Pastries or quick breads with added fat. Meats and other proteins High-fat meats, such as fatty beef or pork, hot dogs, ribs, ham, sausage, salami, and bacon. Fried meat or protein, such as fried fish and fried chicken. Egg yolks. Fats and oils Butter. Margarine. Shortening. Ghee. Drinks Coffee and other drinks with caffeine in them. Fizzy and sugary drinks, such as soda and energy drinks. Fruit juice made with acidic fruits, such as orange or grapefruit. Tomato juice. Sweets and desserts Chocolate and cocoa. Donuts. Seasonings and condiments Mint, such as peppermint and spearmint. Condiments, herbs, or seasonings that cause symptoms. These may include curry, hot sauce, or vinegar-based salad dressings. The items listed above may not be all the foods and drinks you should avoid. Talk with a dietitian to learn more. Questions to ask your health care provider Changes to your diet and everyday life are often the first steps taken to manage symptoms of GERD. If these changes don't help, talk with your provider about taking medicines. Where to find more information [employer]: aboutgerd.org This information is not  intended to replace advice given to you by your health care provider. Make sure you discuss any questions you have with your health care provider. Document Revised: 10/02/2023 Document Reviewed: 04/18/2023 Elsevier Patient Education  2024 Elsevier Inc. GERD in Adults: What to Know  Gastroesophageal reflux (GER) is when acid from your stomach flows up into your esophagus. Your esophagus is the part of your body that moves food from your mouth to your stomach. Normally, food goes down and stays in your stomach to be digested. But with GER, food and stomach acid may go back up. You may have a disease called gastroesophageal reflux disease (GERD) if the reflux: Happens often. Causes very bad symptoms. Makes your esophagus sore and swollen. Over time, GERD can make small holes called ulcers in the lining of your esophagus. What are the causes? GERD is caused by a problem with the muscle between your esophagus and stomach. This muscle is called the lower esophageal sphincter (LES). When it's weak or not normal, it doesn't close like it should. This means food and stomach acid can go back up into your esophagus. The muscle can be weak if: You smoke or use products with tobacco in them. You're pregnant. You have a type of hernia called a hiatal hernia. You eat certain foods and drinks. These include: Alcohol. Coffee. Chocolate. Onions. Peppermint. What increases the risk? Being overweight. Having a disease that affects your connective tissue. Taking NSAIDs, such as ibuprofen . What are the signs or symptoms? Heartburn. Trouble swallowing. Pain when you swallow. The feeling of having a lump in your throat. A bitter taste in your mouth. Bad breath. Having an upset or bloated stomach. Burping. Chest pain. Other conditions can also cause chest pain. Make sure you see your health care provider if you have chest pain. Wheezing. This is when you make high-pitched whistling sounds when you  breathe, most often when you breathe out. A long-term cough or a cough at night. How is this diagnosed? GERD may be diagnosed based on your medical history and a physical exam. You may also have tests. These may include: An endoscopy. This test looks at your stomach and esophagus with a small camera. A barium swallow  test. This shows the shape and size of your esophagus and how well it's working. Tests of your esophagus to check for: Acid levels. Pressure. How is this treated? Treatment may depend on how bad your symptoms are. It may include: Changes to your diet and daily life. Medicines. Surgery. Follow these instructions at home: Eating and drinking Follow an eating plan as told by your provider. You may need to avoid certain foods and drinks. These may include: Coffee and tea, with or without caffeine. Alcohol. Energy drinks and sports drinks. Fizzy drinks or sodas. Chocolate and cocoa. Peppermint and mint flavorings. Garlic and onions. Horseradish. Spicy and acidic foods. These include: Peppers. Chili powder and curry powder. Vinegar. Hot sauces and BBQ sauce. Citrus fruits and juices. These include: Oranges. Lemons. Limes. Tomato-based foods. These include: Red sauce and pizza with red sauce. Chili. Salsa. Fried and fatty foods. These include: Donuts. French fries. Potato chips. High-fat dressings. High-fat meats. These include: Hot dogs and sausage. Rib eye steak. Ham and bacon. High-fat dairy items. These include: Whole milk. Butter. Cream cheese. Eat small meals often. Avoid eating big meals. Avoid drinking lots of liquid with your meals. Try not to eat meals during the 2-3 hours before bedtime. Try not to lie down right after you eat. Do not exercise right after you eat. Lifestyle  If you're overweight, lose an amount of weight that's healthy for you. Ask your provider about a safe weight loss goal. Do not smoke, vape, or use nicotine  or  tobacco. Wear loose clothes. Do not wear things that are tight around your waist. When you sleep, try: Raising the head of your bed about 6 inches (15 cm). You can use a wedge to do this. Lying down on your left side. Try to lower your stress. If you need help doing this, ask your provider. General instructions Take your medicines only as told. Do not take aspirin  or ibuprofen  unless you're told to. Watch for any changes in your symptoms. Do not bend over if it makes your symptoms worse. Contact a health care provider if: You have new symptoms. You have trouble: Drinking. Swallowing. Eating. It hurts to swallow. You have wheezing. You have a cough that won't go away. Your voice is hoarse. Your symptoms don't get better with treatment. Get help right away if: You have pain all of a sudden in your: Arm. Neck. Jaw. Teeth. Back. You feel sweaty, dizzy, or light-headed all of a sudden. You faint. You have chest pain or shortness of breath. You vomit and the vomit is: Green, yellow, or black. Looks like blood or coffee grounds. Your poop is red, bloody, or black. These symptoms may be an emergency. Call 911 right away. Do not wait to see if the symptoms will go away. Do not drive yourself to the hospital. This information is not intended to replace advice given to you by your health care provider. Make sure you discuss any questions you have with your health care provider. Document Revised: 10/02/2023 Document Reviewed: 04/18/2023 Elsevier Patient Education  2024 [employer] Nausea, Adult Nausea is feeling like you may vomit. Feeling like you may vomit is usually not serious, but it may be an early sign of a more serious medical problem. Vomiting is when stomach contents forcefully come out of your mouth. If you vomit, or if you are not able to drink enough fluids, you may not have enough water in your body (get dehydrated). If you do not have enough water in your  body, you  may: Feel tired. Feel thirsty. Have a dry mouth. Have cracked lips. Pee (urinate) less often. Older adults and people who have other diseases or a weak body defense system (immune system) have a higher risk of not having enough water in the body. The main goals of treating this condition are: To relieve your nausea. To ensure your nausea occurs less often. To prevent vomiting and losing too much fluid. Follow these instructions at home: Watch your symptoms for any changes. Tell your doctor about them. Eating and drinking     Take an ORS (oral rehydration solution). This is a drink that is sold at pharmacies and stores. Drink clear fluids in small amounts as you are able. These include: Water. Ice chips. Fruit juice that has water added (diluted fruit juice). Low-calorie sports drinks. Eat bland, easy-to-digest foods in small amounts as you are able, such as: Bananas. Applesauce. Rice. Low-fat (lean) meats. Toast. Crackers. Avoid drinking fluids that have a lot of sugar or caffeine in them. This includes energy drinks, sports drinks, and soda. Avoid alcohol. Avoid spicy or fatty foods. General instructions Take over-the-counter and prescription medicines only as told by your doctor. Rest at home while you get better. Drink enough fluid to keep your pee (urine) pale yellow. Take slow and deep breaths when you feel like you may vomit. Avoid food or things that have strong smells. Wash your hands often with soap and water for at least 20 seconds. If you cannot use soap and water, use hand sanitizer. Make sure that everyone in your home washes their hands well and often. Keep all follow-up visits. Contact a doctor if: You feel worse. You feel like you may vomit and this lasts for more than 2 days. You vomit. You are not able to drink fluids without vomiting. You have new symptoms. You have a fever. You have a headache. You have muscle cramps. You have a rash. You have  pain while peeing. You feel light-headed or dizzy. Get help right away if: You have pain in your chest, neck, arm, or jaw. You feel very weak or you faint. You have vomit that is bright red or looks like coffee grounds. You have bloody or black poop (stools) or poop that looks like tar. You have a very bad headache, a stiff neck, or both. You have very bad pain, cramping, or bloating in your belly (abdomen). You have trouble breathing or you are breathing very quickly. Your heart is beating very quickly. Your skin feels cold and clammy. You feel confused. You have signs of losing too much water in your body, such as: Dark pee, very little pee, or no pee. Cracked lips. Dry mouth. Sunken eyes. Sleepiness. Weakness. These symptoms may be an emergency. Get help right away. Call 911. Do not wait to see if the symptoms will go away. Do not drive yourself to the hospital. Summary Nausea is feeling like you are about vomit. If you vomit, or if you are not able to drink enough fluids, you may not have enough water in your body (get dehydrated). Eat and drink what your doctor tells you. Take over-the-counter and prescription medicines only as told by your doctor. Contact a doctor right away if your symptoms get worse or you have new symptoms. Keep all follow-up visits. This information is not intended to replace advice given to you by your health care provider. Make sure you discuss any questions you have with your health care provider. Document Revised: 05/27/2021  Document Reviewed: 05/27/2021 Elsevier Patient Education  2024 [employer] Nausea and Vomiting, Adult Nausea is feeling that you have an upset stomach and that you are about to vomit. Vomiting is when food in your stomach forcefully comes out of your mouth. Vomiting can make you feel weak. If you vomit, or if you are not able to drink enough fluids, you may not have enough water in your body (get dehydrated). If you do not have  enough water in your body, you may: Feel tired. Feel thirsty. Have a dry mouth. Have cracked lips. Pee (urinate) less often. Older adults and people with other diseases or a weak body defense system (immune system) are at higher risk for not having enough water in the body. If you feel like you may vomit or you vomit, it is important to follow instructions from your doctor about how to take care of yourself. Follow these instructions at home: Watch your symptoms for any changes. Tell your doctor about them. Eating and drinking     Take an ORS (oral rehydration solution). This is a drink that is sold at pharmacies and stores. Drink clear fluids in small amounts as you are able, such as: Water. Ice chips. Fruit juice that has water added (diluted fruit juice). Low-calorie sports drinks. Eat bland, easy-to-digest foods in small amounts as you are able, such as: Bananas. Applesauce. Rice. Low-fat (lean) meats. Toast. Crackers. Avoid drinking fluids that have a lot of sugar or caffeine in them. This includes energy drinks, sports drinks, and soda. Avoid alcohol. Avoid spicy or fatty foods. General instructions Take over-the-counter and prescription medicines only as told by your doctor. Drink enough fluid to keep your pee (urine) pale yellow. Wash your hands often with soap and water for at least 20 seconds. If you cannot use soap and water, use hand sanitizer. Make sure that everyone in your home washes their hands well and often. Rest at home until you feel better. Watch your condition for any changes. Take slow and deep breaths when you feel like you may vomit. Keep all follow-up visits. Contact a doctor if: Your symptoms get worse. You have new symptoms. You have a fever. You cannot drink fluids without vomiting. You feel like you may vomit for more than 2 days. You feel light-headed or dizzy. You have a headache. You have muscle cramps. You have a rash. You have pain  while peeing. Get help right away if: You have pain in your chest, neck, arm, or jaw. You feel very weak or you faint. You vomit again and again. You have vomit that is bright red or looks like black coffee grounds. You have bloody or black poop (stools) or poop that looks like tar. You have a very bad headache, a stiff neck, or both. You have very bad pain, cramping, or bloating in your belly (abdomen). You have trouble breathing. You are breathing very quickly. Your heart is beating very quickly. Your skin feels cold and clammy. You feel confused. You have signs of losing too much water in your body, such as: Dark pee, very little pee, or no pee. Cracked lips. Dry mouth. Sunken eyes. Sleepiness. Weakness. These symptoms may be an emergency. Get help right away. Call 911. Do not wait to see if the symptoms will go away. Do not drive yourself to the hospital. Summary Nausea is feeling that you have an upset stomach and that you are about to vomit. Vomiting is when food in your stomach comes out of  your mouth. Follow instructions from your doctor about eating and drinking. Take over-the-counter and prescription medicines only as told by your doctor. Contact your doctor if your symptoms get worse or you have new symptoms. Keep all follow-up visits. This information is not intended to replace advice given to you by your health care provider. Make sure you discuss any questions you have with your health care provider. Document Revised: 05/27/2021 Document Reviewed: 05/27/2021 Elsevier Patient Education  2024 [employer]  The above assessment and management plan was discussed with the patient. The patient verbalized understanding of and has agreed to the management plan. Patient is aware to call the clinic if they develop any new symptoms or if symptoms persist or worsen. Patient is aware when to return to the clinic for a follow-up visit. Patient educated on when it is appropriate to  go to the emergency department.    Andrei Mccook [place] Thompson, DNP [facility] [address] [place], [place] [address] [phone]

## 2024-10-02 NOTE — Telephone Encounter (Signed)
 FYI Only or Action Required?: FYI only for provider: ED advised.  Patient was last seen in primary care on 09/29/2024 by Tammy Morton Sebastian Nena, NP.  Called Nurse Triage reporting Chest Pain.  Symptoms began yesterday.  Interventions attempted: Rest, hydration, or home remedies.  Symptoms are: gradually worsening.  Triage Disposition: Go to ED Now (or PCP Triage)  Patient/caregiver understands and will follow disposition?: Yes       Copied from CRM [id number]. Topic: Clinical - Red Word Triage >> Oct 02, 2024  7:46 AM Tammy Bauer wrote: Red Word that prompted transfer to Nurse Triage: Pt called in stating that she has chest pains and possibly acid reflux. Pt stated that this started last night and she also threw up like a yellow fluid. Pt also mentioned that she is having some frequent pain under her rib cage. Warm transferred to nurse triage. Reason for Disposition  [1] Chest pain lasts > 5 minutes AND [2] occurred in past 3 days (72 hours) (Exception: Feels exactly the same as previously diagnosed heartburn and has accompanying sour taste in mouth.)  Answer Assessment - Initial Assessment Questions 1. LOCATION: Where does it hurt?       Mid chest, below ribs 2. RADIATION: Does the pain go anywhere else? (e.g., into neck, jaw, arms, back)     None 3. ONSET: When did the chest pain begin? (Minutes, hours or days)      Last evening 4. PATTERN: Does the pain come and go, or has it been constant since it started?  Does it get worse with exertion?      Intermittent 5. DURATION: How long does it last (e.g., seconds, minutes, hours)     About 1 min 6. SEVERITY: How bad is the pain?  (e.g., Scale 1-10; mild, moderate, or severe)     8/10 7. CARDIAC RISK FACTORS: Do you have any history of heart problems or risk factors for heart disease? (e.g., angina, prior heart attack; diabetes, high blood pressure, high cholesterol, smoker, or strong family history of heart  disease)     History of stroke 9. CAUSE: What do you think is causing the chest pain?     Unknown 10. OTHER SYMPTOMS: Do you have any other symptoms? (e.g., dizziness, nausea, vomiting, sweating, fever, difficulty breathing, cough)       N/V, reflux, pt reports decreased appetite and everything tasting like chemicals or sour  Protocols used: Chest Pain-A-AH

## 2024-10-02 NOTE — Telephone Encounter (Signed)
 Meds resent to [facility]. Pt aware.

## 2024-10-02 NOTE — Telephone Encounter (Signed)
 Appt made with Nena today at 9:15am. Pt aware. She wants PCP to evaluate before going to ED. She is not in acute distress.

## 2024-10-09 NOTE — Progress Notes (Signed)
 Subjective:  Patient ID: Tammy Bauer, female    DOB: [birth year], 58 y.o.   MRN: [id number]  Patient Care Team: Deitra Morton Sebastian Nena, NP as PCP - General (Nurse Practitioner)   Chief Complaint:  2 week follow up   HPI: Tammy Bauer is a 58 y.o. female presenting on 10/13/2024 for 2 week follow up   Discussed the use of AI scribe software for clinical note transcription with the patient, who gave verbal consent to proceed.  History of Present Illness Tammy Bauer is a 58 year old female with hypertension who presents for a two-week follow-up for blood pressure management.  Her blood pressure was previously recorded at 149/90 mmHg and is now 190/82 mmHg. She was not compliant with Exforge but has started taking Exforge 10/320 mg daily along with hydrochlorothiazide  50 mg daily.  She has a history of hyperlipidemia and was on atorvastatin  80 mg daily, which she discontinued due to muscle pain. She has not been taking Lipitor and continues to experience muscle soreness. She occasionally takes fish oil  but not consistently.  She has gastroesophageal reflux disease (GERD) and reports improvement in her symptoms. She was on omeprazole but has not been taking it regularly and still experiences reflux.  She has a history of smoking and currently smokes about half a pack to a full pack daily.      Relevant past medical, surgical, family, and social history reviewed and updated as indicated.  Allergies and medications reviewed and updated. Data reviewed: Chart in Epic.   Past Medical History:  Diagnosis Date   Anemia    Diabetes mellitus without complication (HCC)    Hypertension    Stroke [facility])    x2; no deficits   Thyroid  nodule 10/02/2017    Past Surgical History:  Procedure Laterality Date   ENDOMETRIAL ABLATION N/A 01/30/2018   Procedure: ENDOMETRIAL ABLATION WITH MINERVA;  Surgeon: Jayne Vonn DEL, MD;  Location: [facility];  Service: Gynecology;  Laterality: N/A;   HYSTEROSCOPY  WITH D & C N/A 01/30/2018   Procedure: DILATATION AND CURETTAGE /HYSTEROSCOPY;  Surgeon: Jayne Vonn DEL, MD;  Location: [facility];  Service: Gynecology;  Laterality: N/A;   LOOP RECORDER INSERTION N/A 06/27/2017   Procedure: Loop Recorder Insertion;  Surgeon: Inocencio Soyla Lunger, MD;  Location: [facility];  Service: Cardiovascular;  Laterality: N/A;   NERVE, TENDON AND ARTERY REPAIR Left 10/21/2019   Procedure: WOUND EXPLORATION LEFT HAND WITH REPAIR TENDON, ARTERY AND NERVE ;  Surgeon: Murrell Drivers, MD;  Location: [facility];  Service: Orthopedics;  Laterality: Left;   TEE WITHOUT CARDIOVERSION N/A 06/27/2017   Procedure: TRANSESOPHAGEAL ECHOCARDIOGRAM (TEE);  Surgeon: Mona Vinie BROCKS, MD;  Location: [facility];  Service: Cardiovascular;  Laterality: N/A;    Social History   Socioeconomic History   Marital status: Married    Spouse name: Not on file   Number of children: Not on file   Years of education: Not on file   Highest education level: Not on file  Occupational History   Not on file  Tobacco Use   Smoking status: Every Day    Current packs/day: 0.25    Average packs/day: 0.5 packs/day for 25.1 years (12.5 ttl pk-yrs)    Types: Cigarettes    Start date: 08/29/1999   Smokeless tobacco: Never   Tobacco comments:    smoke 5 cigarettes a day  Vaping Use   Vaping status: Never Used  Substance and Sexual Activity  Alcohol use: No   Drug use: No   Sexual activity: Not Currently    Birth control/protection: None  Other Topics Concern   Not on file  Social History Narrative   Not on file   Social Drivers of Health   Financial Resource Strain: Not on file  Food Insecurity: No Food Insecurity (06/24/2024)   Hunger Vital Sign    Worried About Running Out of Food in the Last Year: Never true    Ran Out of Food in the Last Year: Never true  Transportation Needs: No Transportation Needs (06/24/2024)   PRAPARE - Administrator, Civil Service  (Medical): No    Lack of Transportation (Non-Medical): No  Physical Activity: Not on file  Stress: Not on file  Social Connections: Not on file  Intimate Partner Violence: Not At Risk (06/24/2024)   Humiliation, Afraid, Rape, and Kick questionnaire    Fear of Current or Ex-Partner: No    Emotionally Abused: No    Physically Abused: No    Sexually Abused: No    Outpatient Encounter Medications as of 10/13/2024  Medication Sig   amLODipine -valsartan (EXFORGE) 10-320 MG tablet Take 1 tablet by mouth daily.   clopidogrel  (PLAVIX ) 75 MG tablet Take 1 tablet (75 mg total) by mouth daily.   fenofibrate (TRICOR) 145 MG tablet Take 1 tablet (145 mg total) by mouth daily.   glipiZIDE  (GLUCOTROL ) 10 MG tablet TAKE 1 TABLET (10 MG TOTAL) BY MOUTH 2 (TWO) TIMES DAILY BEFORE A MEAL.   insulin  glargine-yfgn (SEMGLEE , YFGN,) 100 UNIT/ML injection Inject 0.3 mLs (30 Units total) into the skin 2 (two) times daily.   INSULIN  SYRINGE 1CC/29G (B-D INSULIN  SYRINGE) 29G X 1/2 1 ML MISC 1 application by Does not apply route at bedtime.   spironolactone (ALDACTONE) 25 MG tablet Take 1 tablet (25 mg total) by mouth daily.   [DISCONTINUED] hydrochlorothiazide  (HYDRODIURIL ) 50 MG tablet Take 1 tablet (50 mg total) by mouth daily.   fish oil -omega-3 fatty acids  1000 MG capsule Take 1 capsule (1 g total) by mouth daily.   omeprazole (PRILOSEC OTC) 20 MG tablet Take 1 tablet (20 mg total) by mouth daily. (Patient not taking: Reported on 10/13/2024)   [DISCONTINUED] atorvastatin  (LIPITOR) 80 MG tablet Take 1 tablet (80 mg total) by mouth at bedtime. (Patient not taking: Reported on 10/13/2024)   [DISCONTINUED] dicyclomine (BENTYL) 10 MG capsule Take 1 capsule (10 mg total) by mouth 4 (four) times daily -  before meals and at bedtime. (Patient not taking: Reported on 10/13/2024)   [DISCONTINUED] fish oil -omega-3 fatty acids  1000 MG capsule Take 2 capsules (2 g total) by mouth daily. (Patient not taking: Reported on  10/13/2024)   [DISCONTINUED] ondansetron  (ZOFRAN ) 4 MG tablet Take 1 tablet (4 mg total) by mouth every 8 (eight) hours as needed for nausea or vomiting. (Patient not taking: Reported on 10/13/2024)   No facility-administered encounter medications on file as of 10/13/2024.    Allergies  Allergen Reactions   Ace Inhibitors Cough    Pertinent ROS per HPI, otherwise unremarkable      Objective:  BP (!) 187/93   Pulse 80   Temp 97.8 F (36.6 C)   Ht 5' 2 (1.575 m)   Wt 116 lb 6.4 oz (52.8 kg)   SpO2 97%   BMI 21.29 kg/m    Wt Readings from Last 3 Encounters:  10/13/24 116 lb 6.4 oz (52.8 kg)  10/02/24 109 lb 9.6 oz (49.7 kg)  09/29/24 115  lb 6.4 oz (52.3 kg)   BP Readings from Last 3 Encounters:  10/13/24 (!) 187/93  10/02/24 (!) 149/90  09/29/24 (!) 150/80     Physical Exam Physical Exam VITALS: BP- 190/82     Results for orders placed or performed in visit on 09/29/24  Bayer DCA Hb A1c Waived   Collection Time: 09/29/24  8:17 AM  Result Value Ref Range   HB A1C (BAYER DCA - WAIVED) 8.7 (H) 4.8 - 5.6 %  Microalbumin / creatinine urine ratio   Collection Time: 09/29/24 10:07 AM  Result Value Ref Range   Creatinine, Urine 116.3 Not Estab. mg/dL   Microalbumin, Urine 82.0 Not Estab. ug/mL   Microalb/Creat Ratio 15 0 - 29 mg/g creat       Pertinent labs & imaging results that were available during my care of the patient were reviewed by me and considered in my medical decision making.  Assessment & Plan:  Tammy Bauer was seen today for 2 week follow up.  Diagnoses and all orders for this visit:  Essential hypertension -     spironolactone (ALDACTONE) 25 MG tablet; Take 1 tablet (25 mg total) by mouth daily.  Mixed hyperlipidemia -     fenofibrate (TRICOR) 145 MG tablet; Take 1 tablet (145 mg total) by mouth daily. -     fish oil -omega-3 fatty acids  1000 MG capsule; Take 1 capsule (1 g total) by mouth daily.     Assessment and Plan Tammy Bauer is a 58 year old  African-American female seen today for chronic disease management, no acute distress Assessment & Plan Essential hypertension Blood pressure remains elevated at 190/82 mmHg second BP 187/93. Non-compliance with Exforge noted. Hydrochlorothiazide  discontinued.  - Discontinued hydrochlorothiazide . - Initiated Aldactone 25 mg daily. - Continue Exforge as prescribed. - Advised to decrease salt intake. - Scheduled follow-up in 4 weeks for blood pressure check. - Consider referral to hypertension specialty clinic if uncontrolled.  Mixed hyperlipidemia Non-compliance with atorvastatin  due to muscle pain. Risk of stroke due to plaque buildup discussed. Alternative therapy needed. - Discontinued atorvastatin . - Initiated Tricor 145 mg daily. - Continue fish oil  daily 1 g daily.  Gastroesophageal reflux disease GERD symptoms improved with omeprazole. Non-compliance noted. - Continue omeprazole daily.  Tobacco use Continues to smoke half a pack to a pack daily.      Continue all other maintenance medications.  Follow up plan: Return in about 4 weeks (around 11/10/2024) for HTN.   Continue healthy lifestyle choices, including diet (rich in fruits, vegetables, and lean proteins, and low in salt and simple carbohydrates) and exercise (at least 30 minutes of moderate physical activity daily).  Educational handout given for    Clinical References  Hypertension, Adult Hypertension is another name for high blood pressure. High blood pressure forces your heart to work harder to pump blood. This can cause problems over time. There are two numbers in a blood pressure reading. There is a top number (systolic) over a bottom number (diastolic). It is best to have a blood pressure that is below 120/80. What are the causes? The cause of this condition is not known. Some other conditions can lead to high blood pressure. What increases the risk? Some lifestyle factors can make you more likely to  develop high blood pressure: Smoking. Not getting enough exercise or physical activity. Being overweight. Having too much fat, sugar, calories, or salt (sodium) in your diet. Drinking too much alcohol. Other risk factors include: Having any of these conditions: Heart disease. Diabetes. High  cholesterol. Kidney disease. Obstructive sleep apnea. Having a family history of high blood pressure and high cholesterol. Age. The risk increases with age. Stress. What are the signs or symptoms? High blood pressure may not cause symptoms. Very high blood pressure (hypertensive crisis) may cause: Headache. Fast or uneven heartbeats (palpitations). Shortness of breath. Nosebleed. Vomiting or feeling like you may vomit (nauseous). Changes in how you see. Very bad chest pain. Feeling dizzy. Seizures. How is this treated? This condition is treated by making healthy lifestyle changes, such as: Eating healthy foods. Exercising more. Drinking less alcohol. Your doctor may prescribe medicine if lifestyle changes do not help enough and if: Your top number is above 130. Your bottom number is above 80. Your personal target blood pressure may vary. Follow these instructions at home: Eating and drinking  If told, follow the DASH eating plan. To follow this plan: Fill one half of your plate at each meal with fruits and vegetables. Fill one fourth of your plate at each meal with whole grains. Whole grains include whole-wheat pasta, brown rice, and whole-grain bread. Eat or drink low-fat dairy products, such as skim milk or low-fat yogurt. Fill one fourth of your plate at each meal with low-fat (lean) proteins. Low-fat proteins include fish, chicken without skin, eggs, beans, and tofu. Avoid fatty meat, cured and processed meat, or chicken with skin. Avoid pre-made or processed food. Limit the amount of salt in your diet to less than 1,500 mg each day. Do not drink alcohol if: Your doctor tells  you not to drink. You are pregnant, may be pregnant, or are planning to become pregnant. If you drink alcohol: Limit how much you have to: 0-1 drink a day for women. 0-2 drinks a day for men. Know how much alcohol is in your drink. In the [place] one drink equals one 12 oz bottle of beer (355 mL), one 5 oz glass of wine (148 mL), or one 1 oz glass of hard liquor (44 mL). Lifestyle  Work with your doctor to stay at a healthy weight or to lose weight. Ask your doctor what the best weight is for you. Get at least 30 minutes of exercise that causes your heart to beat faster (aerobic exercise) most days of the week. This may include walking, swimming, or biking. Get at least 30 minutes of exercise that strengthens your muscles (resistance exercise) at least 3 days a week. This may include lifting weights or doing Pilates. Do not smoke or use any products that contain nicotine  or tobacco. If you need help quitting, ask your doctor. Check your blood pressure at home as told by your doctor. Keep all follow-up visits. Medicines Take over-the-counter and prescription medicines only as told by your doctor. Follow directions carefully. Do not skip doses of blood pressure medicine. The medicine does not work as well if you skip doses. Skipping doses also puts you at risk for problems. Ask your doctor about side effects or reactions to medicines that you should watch for. Contact a doctor if: You think you are having a reaction to the medicine you are taking. You have headaches that keep coming back. You feel dizzy. You have swelling in your ankles. You have trouble with your vision. Get help right away if: You get a very bad headache. You start to feel mixed up (confused). You feel weak or numb. You feel faint. You have very bad pain in your: Chest. Belly (abdomen). You vomit more than once. You have trouble breathing. These  symptoms may be an emergency. Get help right away. Call 911. Do not  wait to see if the symptoms will go away. Do not drive yourself to the hospital. Summary Hypertension is another name for high blood pressure. High blood pressure forces your heart to work harder to pump blood. For most people, a normal blood pressure is less than 120/80. Making healthy choices can help lower blood pressure. If your blood pressure does not get lower with healthy choices, you may need to take medicine. This information is not intended to replace advice given to you by your health care provider. Make sure you discuss any questions you have with your health care provider. Document Revised: 09/08/2021 Document Reviewed: 09/08/2021 Elsevier Patient Education  2024 Else[employer] Dyslipidemia Dyslipidemia is an imbalance of waxy, fat-like substances (lipids) in the blood. The body needs lipids in small amounts. Dyslipidemia often involves a high level of cholesterol or triglycerides, which are types of lipids. Common forms of dyslipidemia include: High levels of LDL cholesterol. LDL is the type of cholesterol that causes fatty deposits (plaques) to build up in the blood vessels that carry blood away from the heart (arteries). Low levels of HDL cholesterol. HDL cholesterol is the type of cholesterol that protects against heart disease. High levels of HDL remove the LDL buildup from arteries. High levels of triglycerides. Triglycerides are a fatty substance in the blood that is linked to a buildup of plaques in the arteries. What are the causes? There are two main types of dyslipidemia: primary and secondary. Primary dyslipidemia is caused by changes (mutations) in genes that are passed down through families (inherited). These mutations cause several types of dyslipidemia. Secondary dyslipidemia may be caused by various risk factors that can lead to the disease, such as lifestyle choices and certain medical conditions. What increases the risk? You are more likely to develop this  condition if you are an older man or if you are a woman who has gone through menopause. Other risk factors include: Having a family history of dyslipidemia. Taking certain medicines, including birth control pills, steroids, some diuretics, and beta-blockers. Eating a diet high in saturated fat. Smoking cigarettes or excessive alcohol intake. Having certain medical conditions such as diabetes, polycystic ovary syndrome (PCOS), kidney disease, liver disease, or hypothyroidism. Not exercising regularly. Being overweight or obese with too much belly fat. What are the signs or symptoms? In most cases, dyslipidemia does not usually cause any symptoms. In severe cases, very high lipid levels can cause: Fatty bumps under the skin (xanthomas). A white or gray ring around the black center (pupil) of the eye. Very high triglyceride levels can cause inflammation of the pancreas (pancreatitis). How is this diagnosed? Your health care provider may diagnose dyslipidemia based on a routine blood test (fasting blood test). Because most people do not have symptoms of the condition, this blood testing (lipid profile) is done on adults age 75 and older and is repeated every 4-6 years. This test checks: Total cholesterol. This measures the total amount of cholesterol in your blood, including LDL cholesterol, HDL cholesterol, and triglycerides. A healthy number is below 200 mg/dL (4.82 mmol/L). LDL cholesterol. The target number for LDL cholesterol is different for each person, depending on individual risk factors. A healthy number is usually below 100 mg/dL (7.40 mmol/L). Ask your health care provider what your LDL cholesterol should be. HDL cholesterol. An HDL level of 60 mg/dL (8.44 mmol/L) or higher is best because it helps to protect against heart disease.  A number below 40 mg/dL (8.96 mmol/L) for men or below 50 mg/dL (8.70 mmol/L) for women increases the risk for heart disease. Triglycerides. A healthy  triglyceride number is below 150 mg/dL (8.30 mmol/L). If your lipid profile is abnormal, your health care provider may do other blood tests. How is this treated? Treatment depends on the type of dyslipidemia that you have and your other risk factors for heart disease and stroke. Your health care provider will have a target range for your lipid levels based on this information. Treatment for dyslipidemia starts with lifestyle changes, such as diet and exercise. Your health care provider may recommend that you: Get regular exercise. Make changes to your diet. Quit smoking if you smoke. Limit your alcohol intake. If diet changes and exercise do not help you reach your goals, your health care provider may also prescribe medicine to lower lipids. The most commonly prescribed type of medicine lowers your LDL cholesterol (statin drug). If you have a high triglyceride level, your provider may prescribe another type of drug (fibrate) or an omega-3 fish oil  supplement, or both. Follow these instructions at home: Eating and drinking  Follow instructions from your health care provider or dietitian about eating or drinking restrictions. Eat a healthy diet as told by your health care provider. This can help you reach and maintain a healthy weight, lower your LDL cholesterol, and raise your HDL cholesterol. This may include: Limiting your calories, if you are overweight. Eating more fruits, vegetables, whole grains, fish, and lean meats. Limiting saturated fat, trans fat, and cholesterol. Do not drink alcohol if: Your health care provider tells you not to drink. You are pregnant, may be pregnant, or are planning to become pregnant. If you drink alcohol: Limit how much you have to: 0-1 drink a day for women. 0-2 drinks a day for men. Know how much alcohol is in your drink. In the [place] one drink equals one 12 oz bottle of beer (355 mL), one 5 oz glass of wine (148 mL), or one 1 oz glass of hard liquor (44  mL). Activity Get regular exercise. Start an exercise and strength training program as told by your health care provider. Ask your health care provider what activities are safe for you. Your health care provider may recommend: 30 minutes of aerobic activity 4-6 days a week. Brisk walking is an example of aerobic activity. Strength training 2 days a week. General instructions Do not use any products that contain nicotine  or tobacco. These products include cigarettes, chewing tobacco, and vaping devices, such as e-cigarettes. If you need help quitting, ask your health care provider. Take over-the-counter and prescription medicines only as told by your health care provider. This includes supplements. Keep all follow-up visits. This is important. Contact a health care provider if: You are having trouble sticking to your exercise or diet plan. You are struggling to quit smoking or to control your use of alcohol. Summary Dyslipidemia often involves a high level of cholesterol or triglycerides, which are types of lipids. Treatment depends on the type of dyslipidemia that you have and your other risk factors for heart disease and stroke. Treatment for dyslipidemia starts with lifestyle changes, such as diet and exercise. Your health care provider may prescribe medicine to lower lipids. This information is not intended to replace advice given to you by your health care provider. Make sure you discuss any questions you have with your health care provider. Document Revised: 06/23/2022 Document Reviewed: 01/24/2021 Elsevier Patient Education  2025  [employer].  The above assessment and management plan was discussed with the patient. The patient verbalized understanding of and has agreed to the management plan. Patient is aware to call the clinic if they develop any new symptoms or if symptoms persist or worsen. Patient is aware when to return to the clinic for a follow-up visit. Patient educated on when  it is appropriate to go to the emergency department.   Belinda Bringhurst [place] Thompson, DNP [facility] [address] [place], [place] [address] [phone]

## 2024-10-13 ENCOUNTER — Encounter: Payer: Self-pay | Admitting: Nurse Practitioner

## 2024-10-13 ENCOUNTER — Ambulatory Visit (INDEPENDENT_AMBULATORY_CARE_PROVIDER_SITE_OTHER): Payer: Self-pay | Admitting: Nurse Practitioner

## 2024-10-13 VITALS — BP 187/93 | HR 80 | Temp 97.8°F | Ht 62.0 in | Wt 116.4 lb

## 2024-10-13 DIAGNOSIS — E782 Mixed hyperlipidemia: Secondary | ICD-10-CM

## 2024-10-13 DIAGNOSIS — I10 Essential (primary) hypertension: Secondary | ICD-10-CM | POA: Diagnosis not present

## 2024-10-13 MED ORDER — SPIRONOLACTONE 25 MG PO TABS: 25.0000 mg | ORAL_TABLET | Freq: Every day | ORAL | 0 refills | Status: AC

## 2024-10-13 MED ORDER — FENOFIBRATE 145 MG PO TABS: 145.0000 mg | ORAL_TABLET | Freq: Every day | ORAL | 5 refills | Status: AC

## 2024-10-13 MED ORDER — OMEGA-3 FATTY ACIDS 1000 MG PO CAPS: 1.0000 g | ORAL_CAPSULE | Freq: Every day | ORAL | Status: AC

## 2024-11-05 NOTE — Progress Notes (Deleted)
Subjective:  Patient ID: Tammy Bauer, female    DOB: Apr 06, 1966, 58 y.o.   MRN: [id number]  Patient Care Team: Deitra Morton Sebastian Nena, NP as PCP - General (Nurse Practitioner)   Chief Complaint:  No chief complaint on file.   HPI: Tammy Bauer is a 58 y.o. female presenting on 11/10/2024 for No chief complaint on file.   Discussed the use of AI scribe software for clinical note transcription with the patient, who gave verbal consent to proceed.  History of Present Illness       Relevant past medical, surgical, family, and social history reviewed and updated as indicated.  Allergies and medications reviewed and updated. Data reviewed: Chart in Epic.   Past Medical History:  Diagnosis Date   Anemia    Diabetes mellitus without complication (HCC)    Hypertension    Stroke [facility])    x2; no deficits   Thyroid  nodule 10/02/2017    Past Surgical History:  Procedure Laterality Date   ENDOMETRIAL ABLATION N/A 01/30/2018   Procedure: ENDOMETRIAL ABLATION WITH MINERVA;  Surgeon: Jayne Vonn DEL, MD;  Location: [facility];  Service: Gynecology;  Laterality: N/A;   HYSTEROSCOPY WITH D & C N/A 01/30/2018   Procedure: DILATATION AND CURETTAGE /HYSTEROSCOPY;  Surgeon: Jayne Vonn DEL, MD;  Location: [facility];  Service: Gynecology;  Laterality: N/A;   LOOP RECORDER INSERTION N/A 06/27/2017   Procedure: Loop Recorder Insertion;  Surgeon: Inocencio Soyla Lunger, MD;  Location: [facility];  Service: Cardiovascular;  Laterality: N/A;   NERVE, TENDON AND ARTERY REPAIR Left 10/21/2019   Procedure: WOUND EXPLORATION LEFT HAND WITH REPAIR TENDON, ARTERY AND NERVE ;  Surgeon: Murrell Drivers, MD;  Location: [facility];  Service: Orthopedics;  Laterality: Left;   TEE WITHOUT CARDIOVERSION N/A 06/27/2017   Procedure: TRANSESOPHAGEAL ECHOCARDIOGRAM (TEE);  Surgeon: Mona Vinie BROCKS, MD;  Location[facility];  Service: Cardiovascular;  Laterality: N/A;    Social History   Socioeconomic  History   Marital status: Married    Spouse name: Not on file   Number of children: Not on file   Years of education: Not on file   Highest education level: Not on file  Occupational History   Not on file  Tobacco Use   Smoking status: Every Day    Current packs/day: 0.25    Average packs/day: 0.5 packs/day for 25.2 years (12.5 ttl pk-yrs)    Types: Cigarettes    Start date: 08/29/1999   Smokeless tobacco: Never   Tobacco comments:    smoke 5 cigarettes a day  Vaping Use   Vaping status: Never Used  Substance and Sexual Activity   Alcohol use: No   Drug use: No   Sexual activity: Not Currently    Birth control/protection: None  Other Topics Concern   Not on file  Social History Narrative   Not on file   Social Drivers of Health   Financial Resource Strain: Not on file  Food Insecurity: No Food Insecurity (06/24/2024)   Hunger Vital Sign    Worried About Running Out of Food in the Last Year: Never true    Ran Out of Food in the Last Year: Never true  Transportation Needs: No Transportation Needs (06/24/2024)   PRAPARE - Administrator, Civil Service (Medical): No    Lack of Transportation (Non-Medical): No  Physical Activity: Not on file  Stress: Not on file  Social Connections: Not on file  Intimate Partner  Violence: Not At Risk (06/24/2024)   Humiliation, Afraid, Rape, and Kick questionnaire    Fear of Current or Ex-Partner: No    Emotionally Abused: No    Physically Abused: No    Sexually Abused: No    Outpatient Encounter Medications as of 11/10/2024  Medication Sig   amLODipine -valsartan  (EXFORGE ) 10-320 MG tablet Take 1 tablet by mouth daily.   clopidogrel  (PLAVIX ) 75 MG tablet Take 1 tablet (75 mg total) by mouth daily.   fenofibrate  (TRICOR ) 145 MG tablet Take 1 tablet (145 mg total) by mouth daily.   fish oil -omega-3 fatty acids  1000 MG capsule Take 1 capsule (1 g total) by mouth daily.   glipiZIDE  (GLUCOTROL ) 10 MG tablet TAKE 1 TABLET (10 MG  TOTAL) BY MOUTH 2 (TWO) TIMES DAILY BEFORE A MEAL.   insulin  glargine-yfgn (SEMGLEE , YFGN,) 100 UNIT/ML injection Inject 0.3 mLs (30 Units total) into the skin 2 (two) times daily.   INSULIN  SYRINGE 1CC/29G (B-D INSULIN  SYRINGE) 29G X 1/2 1 ML MISC 1 application by Does not apply route at bedtime.   omeprazole  (PRILOSEC  OTC) 20 MG tablet Take 1 tablet (20 mg total) by mouth daily. (Patient not taking: Reported on 10/13/2024)   spironolactone  (ALDACTONE ) 25 MG tablet Take 1 tablet (25 mg total) by mouth daily.   No facility-administered encounter medications on file as of 11/10/2024.    Allergies  Allergen Reactions   Ace Inhibitors Cough    Pertinent ROS per HPI, otherwise unremarkable      Objective:  There were no vitals taken for this visit.   Wt Readings from Last 3 Encounters:  10/13/24 116 lb 6.4 oz (52.8 kg)  10/02/24 109 lb 9.6 oz (49.7 kg)  09/29/24 115 lb 6.4 oz (52.3 kg)    Physical Exam Physical Exam      Results for orders placed or performed in visit on 09/29/24  Bayer DCA Hb A1c Waived   Collection Time: 09/29/24  8:17 AM  Result Value Ref Range   HB A1C (BAYER DCA - WAIVED) 8.7 (H) 4.8 - 5.6 %  Microalbumin / creatinine urine ratio   Collection Time: 09/29/24 10:07 AM  Result Value Ref Range   Creatinine, Urine 116.3 Not Estab. mg/dL   Microalbumin, Urine 82.0 Not Estab. ug/mL   Microalb/Creat Ratio 15 0 - 29 mg/g creat       Pertinent labs & imaging results that were available during my care of the patient were reviewed by me and considered in my medical decision making.  Assessment & Plan:  There are no diagnoses linked to this encounter.   Assessment and Plan Assessment & Plan       Continue all other maintenance medications.  Follow up plan: No follow-ups on file.   Continue healthy lifestyle choices, including diet (rich in fruits, vegetables, and lean proteins, and low in salt and simple carbohydrates) and exercise (at least 30  minutes of moderate physical activity daily).  Educational handout given for ***  The above assessment and management plan was discussed with the patient. The patient verbalized understanding of and has agreed to the management plan. Patient is aware to call the clinic if they develop any new symptoms or if symptoms persist or worsen. Patient is aware when to return to the clinic for a follow-up visit. Patient educated on when it is appropriate to go to the emergency department.    Lenoir Facchini [place] Thompson, DNP [facility] [address] [place], [place] [address] [phone]  [phone]   

## 2024-11-10 ENCOUNTER — Ambulatory Visit: Admitting: Nurse Practitioner

## 2025-07-27 ENCOUNTER — Ambulatory Visit: Admitting: Adult Health
